# Patient Record
Sex: Female | Born: 1955 | Race: White | Hispanic: No | Smoking: Former smoker
Health system: Southern US, Community
[De-identification: ages and names within clinical notes are randomized; demographics above are authoritative.]

## PROBLEM LIST (undated history)

## (undated) DIAGNOSIS — Z720 Tobacco use: Secondary | ICD-10-CM

## (undated) DIAGNOSIS — K5792 Diverticulitis of intestine, part unspecified, without perforation or abscess without bleeding: Secondary | ICD-10-CM

## (undated) DIAGNOSIS — Z973 Presence of spectacles and contact lenses: Secondary | ICD-10-CM

## (undated) DIAGNOSIS — K219 Gastro-esophageal reflux disease without esophagitis: Secondary | ICD-10-CM

## (undated) DIAGNOSIS — T7840XA Allergy, unspecified, initial encounter: Secondary | ICD-10-CM

## (undated) DIAGNOSIS — K8070 Calculus of gallbladder and bile duct without cholecystitis without obstruction: Secondary | ICD-10-CM

## (undated) DIAGNOSIS — I8390 Asymptomatic varicose veins of unspecified lower extremity: Secondary | ICD-10-CM

## (undated) HISTORY — PX: TUBAL LIGATION: SHX77

## (undated) HISTORY — DX: Diverticulitis of intestine, part unspecified, without perforation or abscess without bleeding: K57.92

## (undated) HISTORY — PX: OTHER SURGICAL HISTORY: SHX169

## (undated) HISTORY — PX: VARICOSE VEIN SURGERY: SHX832

## (undated) HISTORY — DX: Gastro-esophageal reflux disease without esophagitis: K21.9

## (undated) HISTORY — DX: Allergy, unspecified, initial encounter: T78.40XA

---

## 2015-02-10 ENCOUNTER — Ambulatory Visit (INDEPENDENT_AMBULATORY_CARE_PROVIDER_SITE_OTHER): Payer: 59 | Admitting: Internal Medicine

## 2015-02-10 ENCOUNTER — Ambulatory Visit (INDEPENDENT_AMBULATORY_CARE_PROVIDER_SITE_OTHER): Payer: 59

## 2015-02-10 VITALS — BP 120/76 | HR 98 | Temp 98.6°F | Resp 18

## 2015-02-10 DIAGNOSIS — Z23 Encounter for immunization: Secondary | ICD-10-CM | POA: Diagnosis not present

## 2015-02-10 DIAGNOSIS — S0083XA Contusion of other part of head, initial encounter: Secondary | ICD-10-CM | POA: Diagnosis not present

## 2015-02-10 DIAGNOSIS — S0180XA Unspecified open wound of other part of head, initial encounter: Secondary | ICD-10-CM

## 2015-02-10 NOTE — Progress Notes (Signed)
Procedure Consent obtained. 2 cc 1% lido with epi local anesthesia. Cleaned with soap and water. Sterile draping placed. Wound explored. Galea not interrupted. #4 5-0 vicryl simple interrupted layered closure. 5-0 ethilon subcuticular suture placed. Care instructions placed.

## 2015-02-10 NOTE — Progress Notes (Signed)
   Subjective:    Patient ID: Jasmine Bradford, female    DOB: July 05, 1956, 59 y.o.   MRN: [id number]  HPI [place] this am at home hit forehead on bed foot board. Has wound at and above right eyebrow. No LOC, headache, or neck pain. No neuro sxs.Has healthy med hx on no medications. Would like wound repaired. Needs Tdap. Not on any meds.  Review of Systems     Objective:   Physical Exam  Constitutional: She is oriented to person, place, and time. She appears well-developed and well-nourished. She appears distressed.  HENT:  Head: Head is with contusion and with laceration. Head is without Battle's sign and without abrasion.    Right Ear: External ear normal.  Left Ear: External ear normal.  Nose: Nose normal.  Eyes: EOM are normal. Pupils are equal, round, and reactive to light.  Neck: Normal range of motion. Neck supple.  Cardiovascular: Normal rate.   Pulmonary/Chest: Effort normal.  Musculoskeletal: Normal range of motion.  Neurological: She is alert and oriented to person, place, and time. She exhibits normal muscle tone. Coordination normal.  Psychiatric: She has a normal mood and affect. Her behavior is normal. Judgment and thought content normal.  Vitals reviewed.  Wound repair  [facility] reading (PRIMARY) by  Dr.Guest no fx seen see marker, calcified pineal       Assessment & Plan:  Wound repair by Tishcira PAc Tdap Head care Wound and head care Quit smoking

## 2015-02-10 NOTE — Patient Instructions (Addendum)
Smoking Cessation Quitting smoking is important to your health and has many advantages. However, it is not always easy to quit since nicotine is a very addictive drug. Oftentimes, people try 3 times or more before being able to quit. This document explains the best ways for you to prepare to quit smoking. Quitting takes hard work and a lot of effort, but you can do it. ADVANTAGES OF QUITTING SMOKING  You will live longer, feel better, and live better.  Your body will feel the impact of quitting smoking almost immediately.  Within 20 minutes, blood pressure decreases. Your pulse returns to its normal level.  After 8 hours, carbon monoxide levels in the blood return to normal. Your oxygen level increases.  After 24 hours, the chance of having a heart attack starts to decrease. Your breath, hair, and body stop smelling like smoke.  After 48 hours, damaged nerve endings begin to recover. Your sense of taste and smell improve.  After 72 hours, the body is virtually free of nicotine. Your bronchial tubes relax and breathing becomes easier.  After 2 to 12 weeks, lungs can hold more air. Exercise becomes easier and circulation improves.  The risk of having a heart attack, stroke, cancer, or lung disease is greatly reduced.  After 1 year, the risk of coronary heart disease is cut in half.  After 5 years, the risk of stroke falls to the same as a nonsmoker.  After 10 years, the risk of lung cancer is cut in half and the risk of other cancers decreases significantly.  After 15 years, the risk of coronary heart disease drops, usually to the level of a nonsmoker.  If you are pregnant, quitting smoking will improve your chances of having a healthy baby.  The people you live with, especially any children, will be healthier.  You will have extra money to spend on things other than cigarettes. QUESTIONS TO THINK ABOUT BEFORE ATTEMPTING TO QUIT You may want to talk about your answers with your  health care provider.  Why do you want to quit?  If you tried to quit in the past, what helped and what did not?  What will be the most difficult situations for you after you quit? How will you plan to handle them?  Who can help you through the tough times? Your family? Friends? A health care provider?  What pleasures do you get from smoking? What ways can you still get pleasure if you quit? Here are some questions to ask your health care provider:  How can you help me to be successful at quitting?  What medicine do you think would be best for me and how should I take it?  What should I do if I need more help?  What is smoking withdrawal like? How can I get information on withdrawal? GET READY  Set a quit date.  Change your environment by getting rid of all cigarettes, ashtrays, matches, and lighters in your home, car, or work. Do not let people smoke in your home.  Review your past attempts to quit. Think about what worked and what did not. GET SUPPORT AND ENCOURAGEMENT You have a better chance of being successful if you have help. You can get support in many ways.  Tell your family, friends, and coworkers that you are going to quit and need their support. Ask them not to smoke around you.  Get individual, group, or telephone counseling and support. Programs are available at local hospitals and health centers. Call   your local health department for information about programs in your area.  Spiritual beliefs and practices may help some smokers quit.  Download a "quit meter" on your computer to keep track of quit statistics, such as how long you have gone without smoking, cigarettes not smoked, and money saved.  Get a self-help book about quitting smoking and staying off tobacco. [place] yourself from urges to smoke. Talk to someone, go for a walk, or occupy your time with a task.  Change your normal routine. Take a different route to work.  Drink tea instead of coffee. Eat breakfast in a different place.  Reduce your stress. Take a hot bath, exercise, or read a book.  Plan something enjoyable to do every day. Reward yourself for not smoking.  Explore interactive web-based programs that specialize in helping you quit. GET MEDICINE AND USE IT CORRECTLY Medicines can help you stop smoking and decrease the urge to smoke. Combining medicine with the above behavioral methods and support can greatly increase your chances of successfully quitting smoking.  Nicotine replacement therapy helps deliver nicotine to your body without the negative effects and risks of smoking. Nicotine replacement therapy includes nicotine gum, lozenges, inhalers, nasal sprays, and skin patches. Some may be available over-the-counter and others require a prescription.  Antidepressant medicine helps people abstain from smoking, but how this works is unknown. This medicine is available by prescription.  Nicotinic receptor partial agonist medicine simulates the effect of nicotine in your brain. This medicine is available by prescription. Ask your health care provider for advice about which medicines to use and how to use them based on your health history. Your health care provider will tell you what side effects to look out for if you choose to be on a medicine or therapy. Carefully read the information on the package. Do not use any other product containing nicotine while using a nicotine replacement product.  RELAPSE OR DIFFICULT SITUATIONS Most relapses occur within the first 3 months after quitting. Do not be discouraged if you start smoking again. Remember, most people try several times before finally quitting. You may have symptoms of withdrawal because your body is used to nicotine. You may crave cigarettes, be irritable, feel very hungry, cough often, get headaches, or have difficulty concentrating. The withdrawal symptoms are only temporary. They are strongest  when you first quit, but they will go away within 10-14 days. To reduce the chances of relapse, try to:  Avoid drinking alcohol. Drinking lowers your chances of successfully quitting.  Reduce the amount of caffeine you consume. Once you quit smoking, the amount of caffeine in your body increases and can give you symptoms, such as a rapid heartbeat, sweating, and anxiety.  Avoid smokers because they can make you want to smoke.  Do not let weight gain distract you. Many smokers will gain weight when they quit, usually less than 10 pounds. Eat a healthy diet and stay active. You can always lose the weight gained after you quit.  Find ways to improve your mood other than smoking. FOR MORE INFORMATION  www.smokefree.gov Facial Laceration  A facial laceration is a cut on the face. These injuries can be painful and cause bleeding. Lacerations usually heal quickly, but they need special care to reduce scarring. DIAGNOSIS  Your health care provider will take a medical history, ask for details about how the injury occurred, and examine the wound to determine how deep the cut is. TREATMENT  Some facial lacerations  may not require closure. Others may not be able to be closed because of an increased risk of infection. The risk of infection and the chance for successful closure will depend on various factors, including the amount of time since the injury occurred. The wound may be cleaned to help prevent infection. If closure is appropriate, pain medicines may be given if needed. Your health care provider will use stitches (sutures), wound glue (adhesive), or skin adhesive strips to repair the laceration. These tools bring the skin edges together to allow for faster healing and a better cosmetic outcome. If needed, you may also be given a tetanus shot. HOME CARE INSTRUCTIONS  Only take over-the-counter or prescription medicines as directed by your health care provider.  Follow your health care provider's  instructions for wound care. These instructions will vary depending on the technique used for closing the wound. For Sutures:  Keep the wound clean and dry.   If you were given a bandage (dressing), you should change it at least once a day. Also change the dressing if it becomes wet or dirty, or as directed by your health care provider.   Wash the wound with soap and water 2 times a day. Rinse the wound off with water to remove all soap. Pat the wound dry with a clean towel.   After cleaning, apply a thin layer of the antibiotic ointment recommended by your health care provider. This will help prevent infection and keep the dressing from sticking.   You may shower as usual after the first 24 hours. Do not soak the wound in water until the sutures are removed.   Get your sutures removed as directed by your health care provider. With facial lacerations, sutures should usually be taken out after 4-5 days to avoid stitch marks.   Wait a few days after your sutures are removed before applying any makeup. For Skin Adhesive Strips:  Keep the wound clean and dry.   Do not get the skin adhesive strips wet. You may bathe carefully, using caution to keep the wound dry.   If the wound gets wet, pat it dry with a clean towel.   Skin adhesive strips will fall off on their own. You may trim the strips as the wound heals. Do not remove skin adhesive strips that are still stuck to the wound. They will fall off in time.  For Wound Adhesive:  You may briefly wet your wound in the shower or bath. Do not soak or scrub the wound. Do not swim. Avoid periods of heavy sweating until the skin adhesive has fallen off on its own. After showering or bathing, gently pat the wound dry with a clean towel.   Do not apply liquid medicine, cream medicine, ointment medicine, or makeup to your wound while the skin adhesive is in place. This may loosen the film before your wound is healed.   If a dressing is  placed over the wound, be careful not to apply tape directly over the skin adhesive. This may cause the adhesive to be pulled off before the wound is healed.   Avoid prolonged exposure to sunlight or tanning lamps while the skin adhesive is in place.  The skin adhesive will usually remain in place for 5-10 days, then naturally fall off the skin. Do not pick at the adhesive film.  After Healing: Once the wound has healed, cover the wound with sunscreen during the day for 1 full year. This can help minimize scarring. Exposure to  ultraviolet light in the first year will darken the scar. It can take 1-2 years for the scar to lose its redness and to heal completely.  SEEK IMMEDIATE MEDICAL CARE IF:  You have redness, pain, or swelling around the wound.   You see ayellowish-white fluid (pus) coming from the wound.   You have chills or a fever.  MAKE SURE YOU:  Understand these instructions.  Will watch your condition.  Will get help right away if you are not doing well or get worse. Document Released: 09/15/2004 Document Revised: 05/29/2013 Document Reviewed: 03/21/2013 [facility] Patient Information 2015 [place], [place]. This information is not intended to replace advice given to you by your health care provider. Make sure you discuss any questions you have with your health care provider.  Document Released: 08/02/2001 Document Revised: 12/23/2013 Document Reviewed: 11/17/2011 [facility] Patient Information 2015 [place], [place]. This information is not intended to replace advice given to you by your health care provider. Make sure you discuss any questions you have with your health care provider. Head Injury You have received a head injury. It does not appear serious at this time. Headaches and vomiting are common following head injury. It should be easy to awaken from sleeping. Sometimes it is necessary for you to stay in the emergency department for a while for observation. Sometimes  admission to the hospital may be needed. After injuries such as yours, most problems occur within the first 24 hours, but side effects may occur up to 7-10 days after the injury. It is important for you to carefully monitor your condition and contact your health care provider or seek immediate medical care if there is a change in your condition. WHAT ARE THE TYPES OF HEAD INJURIES? Head injuries can be as minor as a bump. Some head injuries can be more severe. More severe head injuries include:  A jarring injury to the brain (concussion).  A bruise of the brain (contusion). This mean there is bleeding in the brain that can cause swelling.  A cracked skull (skull fracture).  Bleeding in the brain that collects, clots, and forms a bump (hematoma). WHAT CAUSES A HEAD INJURY? A serious head injury is most likely to happen to someone who is in a car wreck and is not wearing a seat belt. Other causes of major head injuries include bicycle or motorcycle accidents, sports injuries, and falls. HOW ARE HEAD INJURIES DIAGNOSED? A complete history of the event leading to the injury and your current symptoms will be helpful in diagnosing head injuries. Many times, pictures of the brain, such as CT or MRI are needed to see the extent of the injury. Often, an overnight hospital stay is necessary for observation.  WHEN SHOULD I SEEK IMMEDIATE MEDICAL CARE?  You should get help right away if:  You have confusion or drowsiness.  You feel sick to your stomach (nauseous) or have continued, forceful vomiting.  You have dizziness or unsteadiness that is getting worse.  You have severe, continued headaches not relieved by medicine. Only take over-the-counter or prescription medicines for pain, fever, or discomfort as directed by your health care provider.  You do not have normal function of the arms or legs or are unable to walk.  You notice changes in the black spots in the center of the colored part of your  eye (pupil).  You have a clear or bloody fluid coming from your nose or ears.  You have a loss of vision. During the next 24 hours after  the injury, you must stay with someone who can watch you for the warning signs. This person should contact local emergency services (911 in the [place] if you have seizures, you become unconscious, or you are unable to wake up. HOW CAN I PREVENT A HEAD INJURY IN THE FUTURE? The most important factor for preventing major head injuries is avoiding motor vehicle accidents. To minimize the potential for damage to your head, it is crucial to wear seat belts while riding in motor vehicles. Wearing helmets while bike riding and playing collision sports (like football) is also helpful. Also, avoiding dangerous activities around the house will further help reduce your risk of head injury.  WHEN CAN I RETURN TO NORMAL ACTIVITIES AND ATHLETICS? You should be reevaluated by your health care provider before returning to these activities. If you have any of the following symptoms, you should not return to activities or contact sports until 1 week after the symptoms have stopped:  Persistent headache.  Dizziness or vertigo.  Poor attention and concentration.  Confusion.  Memory problems.  Nausea or vomiting.  Fatigue or tire easily.  Irritability.  Intolerant of bright lights or loud noises.  Anxiety or depression.  Disturbed sleep. MAKE SURE YOU:   Understand these instructions.  Will watch your condition.  Will get help right away if you are not doing well or get worse. Document Released: 08/08/2005 Document Revised: 08/13/2013 Document Reviewed: 04/15/2013 [facility] Patient Information 2015 [place], [place]. This information is not intended to replace advice given to you by your health care provider. Make sure you discuss any questions you have with your health care provider.

## 2015-02-15 ENCOUNTER — Ambulatory Visit (INDEPENDENT_AMBULATORY_CARE_PROVIDER_SITE_OTHER): Payer: 59 | Admitting: Physician Assistant

## 2015-02-15 VITALS — BP 112/74 | HR 81 | Temp 98.6°F | Resp 18

## 2015-02-15 DIAGNOSIS — S0180XD Unspecified open wound of other part of head, subsequent encounter: Secondary | ICD-10-CM

## 2015-02-15 DIAGNOSIS — Z4802 Encounter for removal of sutures: Secondary | ICD-10-CM

## 2015-02-15 NOTE — Progress Notes (Signed)
Chief Complaint  Patient presents with  . Suture / Staple Removal    left eyebrow    History of Present Illness: Patient presents for Suture Removal.  Wound of the LEFT brow was repaired 5 days ago with #4 vicryl subcutaneous sutures and #1 Ethilon subcuticular suture. She's doing well, without problems or concerns. No pain, swelling, drainage. No fever.   No Known Allergies  Prior to Admission medications   Not on File    There are no active problems to display for this patient.    Physical Exam  Constitutional: She is oriented to person, place, and time. She appears well-developed and well-nourished. She is active and cooperative. No distress.  BP 112/74 mmHg  Pulse 81  Temp(Src) 98.6 F (37 C) (Oral)  Resp 18  SpO2 99%   Eyes: Conjunctivae are normal.  Pulmonary/Chest: Effort normal.  Neurological: She is alert and oriented to person, place, and time.  Skin: Skin is warm and dry. Ecchymosis (of the LEFT periorbital area consistent with her injury, appears to be fading.) and laceration (well healed laceration. No evidence of cellulitis. Ethilon suture removed without difficulty.) noted. No rash noted.  Psychiatric: She has a normal mood and affect. Her speech is normal and behavior is normal.      ASSESSMENT & PLAN:  1. Wound, open, face, subsequent encounter 2. Encounter for removal of sutures Local wound care. Anticipatory guidance provided. RTC PRN.   Fara Chute, PA-C Physician Assistant-Certified [facility]

## 2016-03-03 ENCOUNTER — Encounter (HOSPITAL_COMMUNITY): Payer: Self-pay | Admitting: *Deleted

## 2016-03-03 ENCOUNTER — Emergency Department (HOSPITAL_COMMUNITY): Payer: 59

## 2016-03-03 ENCOUNTER — Observation Stay (HOSPITAL_COMMUNITY)
Admission: EM | Admit: 2016-03-03 | Discharge: 2016-03-05 | Disposition: A | Discharge status: Home / Self Care | Payer: 59 | Attending: Internal Medicine | Admitting: Internal Medicine

## 2016-03-03 DIAGNOSIS — R7989 Other specified abnormal findings of blood chemistry: Secondary | ICD-10-CM | POA: Diagnosis not present

## 2016-03-03 DIAGNOSIS — R0789 Other chest pain: Secondary | ICD-10-CM | POA: Diagnosis not present

## 2016-03-03 DIAGNOSIS — R109 Unspecified abdominal pain: Secondary | ICD-10-CM | POA: Diagnosis present

## 2016-03-03 DIAGNOSIS — E876 Hypokalemia: Secondary | ICD-10-CM | POA: Diagnosis not present

## 2016-03-03 DIAGNOSIS — K8070 Calculus of gallbladder and bile duct without cholecystitis without obstruction: Principal | ICD-10-CM | POA: Insufficient documentation

## 2016-03-03 DIAGNOSIS — R1013 Epigastric pain: Secondary | ICD-10-CM

## 2016-03-03 DIAGNOSIS — Z720 Tobacco use: Secondary | ICD-10-CM | POA: Diagnosis present

## 2016-03-03 DIAGNOSIS — Z87891 Personal history of nicotine dependence: Secondary | ICD-10-CM | POA: Diagnosis present

## 2016-03-03 DIAGNOSIS — F17200 Nicotine dependence, unspecified, uncomplicated: Secondary | ICD-10-CM | POA: Diagnosis not present

## 2016-03-03 DIAGNOSIS — R945 Abnormal results of liver function studies: Secondary | ICD-10-CM | POA: Diagnosis present

## 2016-03-03 HISTORY — DX: Tobacco use: Z72.0

## 2016-03-03 LAB — HEPATIC FUNCTION PANEL
ALBUMIN: 3.8 g/dL (ref 3.5–5.0)
ALK PHOS: 337 U/L — AB (ref 38–126)
ALT: 524 U/L — AB (ref 14–54)
AST: 262 U/L — AB (ref 15–41)
BILIRUBIN TOTAL: 1.8 mg/dL — AB (ref 0.3–1.2)
Bilirubin, Direct: 1 mg/dL — ABNORMAL HIGH (ref 0.1–0.5)
Indirect Bilirubin: 0.8 mg/dL (ref 0.3–0.9)
Total Protein: 6.5 g/dL (ref 6.5–8.1)

## 2016-03-03 LAB — I-STAT TROPONIN, ED
Troponin i, poc: 0 ng/mL (ref 0.00–0.08)
Troponin i, poc: 0 ng/mL (ref 0.00–0.08)

## 2016-03-03 LAB — CBC
HCT: 42.6 % (ref 36.0–46.0)
Hemoglobin: 14.3 g/dL (ref 12.0–15.0)
MCH: 28.9 pg (ref 26.0–34.0)
MCHC: 33.6 g/dL (ref 30.0–36.0)
MCV: 86.2 fL (ref 78.0–100.0)
PLATELETS: 205 10*3/uL (ref 150–400)
RBC: 4.94 MIL/uL (ref 3.87–5.11)
RDW: 13 % (ref 11.5–15.5)
WBC: 8.5 10*3/uL (ref 4.0–10.5)

## 2016-03-03 LAB — BASIC METABOLIC PANEL
Anion gap: 10 (ref 5–15)
BUN: 8 mg/dL (ref 6–20)
CHLORIDE: 104 mmol/L (ref 101–111)
CO2: 26 mmol/L (ref 22–32)
CREATININE: 0.81 mg/dL (ref 0.44–1.00)
Calcium: 9.1 mg/dL (ref 8.9–10.3)
GFR calc Af Amer: >60 mL/min (ref >60)
GFR calc non Af Amer: >60 mL/min (ref >60)
GLUCOSE: 98 mg/dL (ref 65–99)
Potassium: 3.4 mmol/L — ABNORMAL LOW (ref 3.5–5.1)
Sodium: 140 mmol/L (ref 135–145)

## 2016-03-03 LAB — LIPASE, BLOOD: Lipase: 24 U/L (ref 11–51)

## 2016-03-03 MED ORDER — GI COCKTAIL ~~LOC~~
30.0000 mL | Freq: Once | ORAL | Status: AC
  Administered 2016-03-03: 30 mL via ORAL
  Filled 2016-03-03: qty 30

## 2016-03-03 NOTE — ED Notes (Signed)
Pt c/o chest pain since Tuesday. States that today she started having pressure today and that was what scared her.

## 2016-03-03 NOTE — ED Provider Notes (Signed)
CSN: [id number]     Arrival date & time 03/03/16  1755 History   First MD Initiated Contact with Patient 03/03/16 2032     Chief Complaint  Patient presents with  . Chest Pain     (Consider location/radiation/quality/duration/timing/severity/associated sxs/prior Treatment) HPI Comments: 60 year old female with no significant past medical history presents for upper abdominal/chest pain. The patient reports that on Tuesday night she had severe pain in her upper abdomen that seemed to travel up into her chest. She says she has a history of indigestion and tried taking Alka-Seltzer without relief at that time. She said that she vomited multiple times during the acute pain. She actually had to call off of work. She said she would then was feeling better and did go to work today but the pain started to come back. When the pain felt like it was coming back she asked her husband to drive her to the emergency department. She reports that today he did feel like an Alka-Seltzer helped to calm her symptoms. She denies fevers or chills. No diarrhea. She says the pain was similar to her previous indigestion episodes but significantly worse. She has had limited by mouth intake since the onset of symptoms. She says eating makes the symptoms worse.  Patient is a 60 y.o. female presenting with chest pain.  Chest Pain Associated symptoms: abdominal pain (upper), back pain (pain does feel like it radiates into her back), nausea and vomiting (currently resolved)   Associated symptoms: no cough, no dizziness, no fatigue, no fever, no headache, no palpitations, no shortness of breath and no weakness     History reviewed. No pertinent past medical history. History reviewed. No pertinent past surgical history. No family history on file. Social History  Substance Use Topics  . Smoking status: Current Every Day Smoker  . Smokeless tobacco: None  . Alcohol Use: No   OB History    No data available     Review of  Systems  Constitutional: Negative for fever, chills and fatigue.  HENT: Negative for congestion, postnasal drip, rhinorrhea and sinus pressure.   Eyes: Negative for visual disturbance.  Respiratory: Negative for cough, chest tightness and shortness of breath.   Cardiovascular: Positive for chest pain. Negative for palpitations and leg swelling.  Gastrointestinal: Positive for nausea, vomiting (currently resolved) and abdominal pain (upper). Negative for diarrhea and constipation.  Genitourinary: Negative for dysuria, urgency and hematuria.  Musculoskeletal: Positive for back pain (pain does feel like it radiates into her back). Negative for myalgias.  Skin: Negative for rash.  Neurological: Negative for dizziness, weakness, light-headedness and headaches.  Hematological: Does not bruise/bleed easily.      Allergies  Review of patient's allergies indicates no known allergies.  Home Medications   Prior to Admission medications   Medication Sig Start Date End Date Taking? Authorizing Provider  Ibuprofen-Diphenhydramine HCl (ADVIL PM) 200-25 MG CAPS Take 1 tablet by mouth daily as needed. For leg pain   Yes Historical Provider, MD  Polyethyl Glycol-Propyl Glycol (SYSTANE OP) Place 1 drop into both eyes 2 (two) times daily.   Yes Historical Provider, MD   BP 119/63 mmHg  Pulse 51  Temp(Src) 98.7 F (37.1 C) (Oral)  Resp 15  Ht 5\' 2"  (1.575 m)  Wt 125 lb (56.7 kg)  BMI 22.86 kg/m2  SpO2 93% Physical Exam  Constitutional: She is oriented to person, place, and time. She appears well-developed and well-nourished. No distress.  HENT:  Head: Normocephalic and atraumatic.  Right Ear:  External ear normal.  Left Ear: External ear normal.  Nose: Nose normal.  Mouth/Throat: Oropharynx is clear and moist. No oropharyngeal exudate.  Eyes: EOM are normal. Pupils are equal, round, and reactive to light.  Neck: Normal range of motion. Neck supple.  Cardiovascular: Normal rate, regular rhythm,  normal heart sounds and intact distal pulses.   No murmur heard. Pulmonary/Chest: Effort normal. No respiratory distress. She has no wheezes. She has no rales.  Abdominal: Soft. She exhibits no distension. There is tenderness (epigastric).  Musculoskeletal: Normal range of motion. She exhibits no edema or tenderness.  Neurological: She is alert and oriented to person, place, and time.  Skin: Skin is warm and dry. No rash noted. She is not diaphoretic.  Vitals reviewed.   ED Course  Procedures (including critical care time) Labs Review Labs Reviewed  BASIC METABOLIC PANEL - Abnormal; Notable for the following:    Potassium 3.4 (*)    All other components within normal limits  HEPATIC FUNCTION PANEL - Abnormal; Notable for the following:    AST 262 (*)    ALT 524 (*)    Alkaline Phosphatase 337 (*)    Total Bilirubin 1.8 (*)    Bilirubin, Direct 1.0 (*)    All other components within normal limits  CBC  LIPASE, BLOOD  I-STAT TROPOININ, ED  Randolm Idol, ED    Imaging Review Dg Chest 2 View  03/03/2016  CLINICAL DATA:  Central chest pain with emesis. EXAM: CHEST  2 VIEW COMPARISON:  None. FINDINGS: Tapering of the peripheral pulmonary vasculature favors emphysema. Cardiac and mediastinal margins appear normal. No pleural effusion. No pneumomediastinum. IMPRESSION: 1. Possible emphysema. Otherwise, no significant abnormalities are observed. Electronically Signed   By: Van Clines M.D.   On: 03/03/2016 18:30   US Abdomen Limited Ruq  03/03/2016  CLINICAL DATA:  60 year old female with elevated LFTs and epigastric pain. EXAM: US ABDOMEN LIMITED - RIGHT UPPER QUADRANT COMPARISON:  None. FINDINGS: Gallbladder: The gallbladder is filled with stones. There is no gallbladder wall thickening or pericholecystic fluid. Negative sonographic Murphy's sign. Common bile duct: Diameter: 5 mm Liver: There is a 1.8 cm focal area of calcification in the right lobe of the liver which may be  sequela of chronic inflammation or granuloma. The liver is otherwise unremarkable. IMPRESSION: Cholelithiasis without sonographic evidence acute cholecystitis. A hepatobiliary scintigraphy may provide better evaluation of the gallbladder if an acute cholecystitis is clinically suspected. Electronically Signed   By: Anner Crete M.D.   On: 03/03/2016 23:08   I have personally reviewed and evaluated these images and lab results as part of my medical decision-making.   EKG Interpretation   Date/Time:  Thursday March 03 2016 18:01:12 EDT Ventricular Rate:  88 PR Interval:  130 QRS Duration: 82 QT Interval:  366 QTC Calculation: 442 R Axis:   80 Text Interpretation:  Normal sinus rhythm with sinus arrhythmia  Nonspecific ST abnormality Abnormal ECG No previous ECGs available  Confirmed by NGUYEN, EMILY ([id number]) on 03/03/2016 8:27:02 PM      MDM  Patient was seen and evaluated in stable condition. Patient with epigastric tenderness. Patient otherwise well-appearing. History sounds more consistent with abdominal pathology. Lipase and LFTs obtained. Lipase normal and LFTs elevated. Abdominal ultrasound showed cholelithiasis without sign of cholecystitis. In light of patient's significantly elevated LFTs and her concerning history is recommended that the patient be admitted for more definitive study with an MRCP. Serial troponins were normal. There were nonspecific ST irregularities on her  EKG. Case was discussed with Dr.Niu who agreed with admission. Patient was admitted under his care in stable condition. Final diagnoses:  Epigastric pain  Elevated LFTs    1. Epigastric pain 2. Elevated LFTs 3. Cholelithiasis    Harvel Quale, MD 03/04/16 [phone]

## 2016-03-03 NOTE — H&P (Signed)
History and Physical    Jasmine Bradford [id number] DOB: July 07, 1956 DOA: 03/03/2016  Referring MD/NP/PA:   PCP: No PCP Per Patient   Patient coming from:  The patient is coming from home.  At baseline, pt is independent for most of ADL.       Chief Complaint: Abdominal pain and chest pressure  HPI: Jasmine Bradford is a 60 y.o. female with medical history significant of tobacco abuse, who presents with abdominal pain and chest pressure.  Patient reports that she has been having abdominal pain in the past 2 days. The pain is located in the upper abdomen, intermittent. It happens every few hours. When it happens, it is sharp and can reach 10 out of 10 in severity, radiating to the lower abdomen. It is associative with nausea no vomiting. She vomited 3 times on Tuesday without blood in the vomitus. She did not vomited today. No fever, chills, diarrhea.  Patient states that she has one episode of chest pressure today. It is located in the substernal area, mild, 4 out of 10 in severity, lasted for a few hours, resolved spontaneously. Currently, no chest pain or chest pressure. Patient does not have shortness breath. Patient denies symptoms of UTI, cough, unilateral weakness.  ED Course: pt was found to have negative troponin, lipase 24, WBC8.5, temperature normal, abnormal liver function with ALT 337, AST 262, ALT 524 and total bilirubin 1.8, potassium 3.4, creatinine normal. Chest x-ray showed possible emphysema. Abdominal ultrasound showed cholelithiasis without sonographic evidence acute cholecystitis.    Review of Systems:   General: no fevers, chills, no changes in body weight, has poor appetite, has fatigue HEENT: no blurry vision, hearing changes or sore throat Pulm: no dyspnea, coughing, wheezing CV: had chest pressure, no palpitations Abd: has nausea, vomiting, abdominal pain, no diarrhea, constipation GU: no dysuria, burning on urination, increased urinary frequency, hematuria  Ext:  no leg edema Neuro: no unilateral weakness, numbness, or tingling, no vision change or hearing loss Skin: no rash MSK: No muscle spasm, no deformity, no limitation of range of movement in spin Heme: No easy bruising.  Travel history: No recent long distant travel.  Allergy: No Known Allergies  Past Medical History  Diagnosis Date  . Tobacco abuse     Past Surgical History  Procedure Laterality Date  . Cesarean section      Social History:  reports that she has been smoking.  She does not have any smokeless tobacco history on file. She reports that she does not drink alcohol or use illicit drugs.  Family History:  Family History  Problem Relation Age of Onset  . Memory loss Mother      Prior to Admission medications   Medication Sig Start Date End Date Taking? Authorizing Provider  Ibuprofen-Diphenhydramine HCl (ADVIL PM) 200-25 MG CAPS Take 1 tablet by mouth daily as needed. For leg pain   Yes Historical Provider, MD  Polyethyl Glycol-Propyl Glycol (SYSTANE OP) Place 1 drop into both eyes 2 (two) times daily.   Yes Historical Provider, MD    Physical Exam: Filed Vitals:   03/03/16 2230 03/03/16 2320 03/03/16 2330 03/04/16 0000  BP: 113/83 119/63 124/65 122/67  Pulse: 63 51 74 73  Temp:      TempSrc:      Resp: 11 15 22 13   Height:      Weight:      SpO2: 97% 93% 97% 98%   General: Not in acute distress HEENT:       Eyes:  PERRL, EOMI, no scleral icterus.       ENT: No discharge from the ears and nose, no pharynx injection, no tonsillar enlargement.        Neck: No JVD, no bruit, no mass felt. Heme: No neck lymph node enlargement. Cardiac: S1/S2, RRR, No murmurs, No gallops or rubs. Pulm:  No rales, wheezing, rhonchi or rubs. Abd: Soft, nondistended, mild tenderness over epigastric area, no rebound pain, no organomegaly, BS present. GU: No hematuria Ext: No pitting leg edema bilaterally. 2+DP/PT pulse bilaterally. Musculoskeletal: No joint deformities, No joint  redness or warmth, no limitation of ROM in spin. Skin: No rashes.  Neuro: Alert, oriented X3, cranial nerves II-XII grossly intact, moves all extremities normally. Psych: Patient is not psychotic, no suicidal or hemocidal ideation.  Labs on Admission: I have personally reviewed following labs and imaging studies  CBC:  Recent Labs Lab 03/03/16 1804  WBC 8.5  HGB 14.3  HCT 42.6  MCV 86.2  PLT 99991111   Basic Metabolic Panel:  Recent Labs Lab 03/03/16 1804  NA 140  K 3.4*  CL 104  CO2 26  GLUCOSE 98  BUN 8  CREATININE 0.81  CALCIUM 9.1   GFR: Estimated Creatinine Clearance: 57.7 mL/min (by C-G formula based on Cr of 0.81). Liver Function Tests:  Recent Labs Lab 03/03/16 1804  AST 262*  ALT 524*  ALKPHOS 337*  BILITOT 1.8*  PROT 6.5  ALBUMIN 3.8    Recent Labs Lab 03/03/16 1804  LIPASE 24   No results for input(s): AMMONIA in the last 168 hours. Coagulation Profile: No results for input(s): INR, PROTIME in the last 168 hours. Cardiac Enzymes: No results for input(s): CKTOTAL, CKMB, CKMBINDEX, TROPONINI in the last 168 hours. BNP (last 3 results) No results for input(s): PROBNP in the last 8760 hours. HbA1C: No results for input(s): HGBA1C in the last 72 hours. CBG: No results for input(s): GLUCAP in the last 168 hours. Lipid Profile: No results for input(s): CHOL, HDL, LDLCALC, TRIG, CHOLHDL, LDLDIRECT in the last 72 hours. Thyroid Function Tests: No results for input(s): TSH, T4TOTAL, FREET4, T3FREE, THYROIDAB in the last 72 hours. Anemia Panel: No results for input(s): VITAMINB12, FOLATE, FERRITIN, TIBC, IRON, RETICCTPCT in the last 72 hours. Urine analysis: No results found for: COLORURINE, APPEARANCEUR, LABSPEC, PHURINE, GLUCOSEU, HGBUR, BILIRUBINUR, KETONESUR, PROTEINUR, UROBILINOGEN, NITRITE, LEUKOCYTESUR Sepsis Labs: @LABRCNTIP (procalcitonin:4,lacticidven:4) )No results found for this or any previous visit (from the past 240 hour(s)).    Radiological Exams on Admission: Dg Chest 2 View  03/03/2016  CLINICAL DATA:  Central chest pain with emesis. EXAM: CHEST  2 VIEW COMPARISON:  None. FINDINGS: Tapering of the peripheral pulmonary vasculature favors emphysema. Cardiac and mediastinal margins appear normal. No pleural effusion. No pneumomediastinum. IMPRESSION: 1. Possible emphysema. Otherwise, no significant abnormalities are observed. Electronically Signed   By: Van Clines M.D.   On: 03/03/2016 18:30   US Abdomen Limited Ruq  03/03/2016  CLINICAL DATA:  60 year old female with elevated LFTs and epigastric pain. EXAM: US ABDOMEN LIMITED - RIGHT UPPER QUADRANT COMPARISON:  None. FINDINGS: Gallbladder: The gallbladder is filled with stones. There is no gallbladder wall thickening or pericholecystic fluid. Negative sonographic Murphy's sign. Common bile duct: Diameter: 5 mm Liver: There is a 1.8 cm focal area of calcification in the right lobe of the liver which may be sequela of chronic inflammation or granuloma. The liver is otherwise unremarkable. IMPRESSION: Cholelithiasis without sonographic evidence acute cholecystitis. A hepatobiliary scintigraphy may provide better evaluation of the gallbladder  if an acute cholecystitis is clinically suspected. Electronically Signed   By: Anner Crete M.D.   On: 03/03/2016 23:08     EKG: Independently reviewed. Sinus rhythm, QTC 442, T-wave flattening, mild ST depression in V4-V5.  Assessment/Plan Principal Problem:   Abdominal pain Active Problems:   Tobacco abuse   Hypokalemia   Chest pressure   LFTs abnormal   Abdominal pain: lipase is normal. Abdominal ultrasound showed cholelithiasis without sonographic evidence acute cholecystitis. Her LFT is abnormal.  -will place on tele bed for obs due to chest pressure and abnormal EKG -will get MRCP for further evaluation of biliary system -Check hepatitis panel -Avoid liver toxic medications, such as Tylenol -prn Zofran for  nausea and oxycodone for pain -IV fluid: Normal saline 75 mL per hour  Abnormal LFT: ALT 337, AST 262, ALT 524 and total bilirubin 1.8 -see above  Chest pressure: has resolved now. Initial troponin is negative. Patient has abnormal EKG with T-wave flattening and mild ST depression in V4-V5. Given her old age and history of tobacco abuse, will do chest pain rule out. - cycle CE q6 x3 and repeat her EKG in the am  - prn Nitroglycerin, Morphine, and aspirin - Risk factor stratification: will check FLP and A1C  - 2d echo  Tobacco abuse: -Did counseling about importance of quitting smoking -Nicotine patch  Hypokalemia: K= 3.4 on admission. - Repleted   DVT ppx:  SQ Lovenox Code Status: Full code Family Communication: Yes, patient's husband at bed side Disposition Plan:  Anticipate discharge back to previous home environment Consults called:  none Admission status: Obs / tele  Date of Service 03/04/2016    Ivor Costa [facility] Pager [phone]  If 7PM-7AM, please contact night-coverage [email or web address] Password [email or web address] 03/04/2016, 12:30 AM

## 2016-03-03 NOTE — ED Notes (Signed)
EDP at bedside  

## 2016-03-03 NOTE — ED Notes (Signed)
Patient transported to Ultrasound 

## 2016-03-03 NOTE — ED Notes (Signed)
Pt states she has taken alka seltzer and Prilosec to relieve the pain but no relief.

## 2016-03-04 ENCOUNTER — Observation Stay (HOSPITAL_COMMUNITY): Payer: 59

## 2016-03-04 ENCOUNTER — Observation Stay (HOSPITAL_BASED_OUTPATIENT_CLINIC_OR_DEPARTMENT_OTHER): Payer: 59

## 2016-03-04 ENCOUNTER — Encounter (HOSPITAL_COMMUNITY): Payer: Self-pay | Admitting: Internal Medicine

## 2016-03-04 ENCOUNTER — Other Ambulatory Visit (HOSPITAL_COMMUNITY): Payer: 59

## 2016-03-04 DIAGNOSIS — R1010 Upper abdominal pain, unspecified: Secondary | ICD-10-CM | POA: Diagnosis not present

## 2016-03-04 DIAGNOSIS — Z720 Tobacco use: Secondary | ICD-10-CM

## 2016-03-04 DIAGNOSIS — F17200 Nicotine dependence, unspecified, uncomplicated: Secondary | ICD-10-CM | POA: Diagnosis not present

## 2016-03-04 DIAGNOSIS — R7989 Other specified abnormal findings of blood chemistry: Secondary | ICD-10-CM | POA: Insufficient documentation

## 2016-03-04 DIAGNOSIS — R0789 Other chest pain: Secondary | ICD-10-CM

## 2016-03-04 DIAGNOSIS — E876 Hypokalemia: Secondary | ICD-10-CM

## 2016-03-04 DIAGNOSIS — R945 Abnormal results of liver function studies: Secondary | ICD-10-CM

## 2016-03-04 DIAGNOSIS — R079 Chest pain, unspecified: Secondary | ICD-10-CM | POA: Diagnosis not present

## 2016-03-04 DIAGNOSIS — K8070 Calculus of gallbladder and bile duct without cholecystitis without obstruction: Secondary | ICD-10-CM | POA: Diagnosis not present

## 2016-03-04 LAB — LIPID PANEL
CHOLESTEROL: 198 mg/dL (ref 0–200)
HDL: 53 mg/dL (ref >40)
LDL Cholesterol: 131 mg/dL — ABNORMAL HIGH (ref 0–99)
TRIGLYCERIDES: 68 mg/dL (ref <150)
Total CHOL/HDL Ratio: 3.7 RATIO
VLDL: 14 mg/dL (ref 0–40)

## 2016-03-04 LAB — CBC
HEMATOCRIT: 40.5 % (ref 36.0–46.0)
HEMOGLOBIN: 13.6 g/dL (ref 12.0–15.0)
MCH: 29.2 pg (ref 26.0–34.0)
MCHC: 33.6 g/dL (ref 30.0–36.0)
MCV: 87.1 fL (ref 78.0–100.0)
Platelets: 183 10*3/uL (ref 150–400)
RBC: 4.65 MIL/uL (ref 3.87–5.11)
RDW: 13.3 % (ref 11.5–15.5)
WBC: 5.3 10*3/uL (ref 4.0–10.5)

## 2016-03-04 LAB — COMPREHENSIVE METABOLIC PANEL
ALT: 446 U/L — ABNORMAL HIGH (ref 14–54)
ANION GAP: 10 (ref 5–15)
AST: 182 U/L — ABNORMAL HIGH (ref 15–41)
Albumin: 3.3 g/dL — ABNORMAL LOW (ref 3.5–5.0)
Alkaline Phosphatase: 336 U/L — ABNORMAL HIGH (ref 38–126)
BILIRUBIN TOTAL: 1.2 mg/dL (ref 0.3–1.2)
BUN: 8 mg/dL (ref 6–20)
CO2: 26 mmol/L (ref 22–32)
Calcium: 8.8 mg/dL — ABNORMAL LOW (ref 8.9–10.3)
Chloride: 103 mmol/L (ref 101–111)
Creatinine, Ser: 0.75 mg/dL (ref 0.44–1.00)
GFR calc Af Amer: >60 mL/min (ref >60)
Glucose, Bld: 86 mg/dL (ref 65–99)
POTASSIUM: 3.9 mmol/L (ref 3.5–5.1)
Sodium: 139 mmol/L (ref 135–145)
TOTAL PROTEIN: 6.2 g/dL — AB (ref 6.5–8.1)

## 2016-03-04 LAB — ECHOCARDIOGRAM COMPLETE
Height: 62 in
WEIGHTICAEL: 1940.8 [oz_av]

## 2016-03-04 LAB — RAPID URINE DRUG SCREEN, HOSP PERFORMED
AMPHETAMINES: NOT DETECTED
BARBITURATES: POSITIVE — AB
BENZODIAZEPINES: NOT DETECTED
COCAINE: NOT DETECTED
Opiates: NOT DETECTED
TETRAHYDROCANNABINOL: NOT DETECTED

## 2016-03-04 LAB — PROTIME-INR
INR: 0.94 (ref 0.00–1.49)
PROTHROMBIN TIME: 12.8 s (ref 11.6–15.2)

## 2016-03-04 LAB — TROPONIN I
Troponin I: <0.03 ng/mL (ref <0.03)
Troponin I: <0.03 ng/mL (ref <0.03)

## 2016-03-04 LAB — GLUCOSE, CAPILLARY: GLUCOSE-CAPILLARY: 88 mg/dL (ref 65–99)

## 2016-03-04 MED ORDER — POLYVINYL ALCOHOL 1.4 % OP SOLN
1.0000 [drp] | Freq: Two times a day (BID) | OPHTHALMIC | Status: DC
  Administered 2016-03-04 (×2): 1 [drp] via OPHTHALMIC
  Filled 2016-03-04: qty 15

## 2016-03-04 MED ORDER — OXYCODONE HCL 5 MG PO TABS: 10.0000 mg | ORAL_TABLET | Freq: Four times a day (QID) | ORAL | Status: DC | PRN

## 2016-03-04 MED ORDER — POTASSIUM CHLORIDE 20 MEQ/15ML (10%) PO SOLN
20.0000 meq | Freq: Once | ORAL | Status: AC
  Administered 2016-03-04: 20 meq via ORAL
  Filled 2016-03-04: qty 15

## 2016-03-04 MED ORDER — MORPHINE SULFATE (PF) 2 MG/ML IV SOLN: 2.0000 mg | INTRAVENOUS | Status: DC | PRN

## 2016-03-04 MED ORDER — NICOTINE 21 MG/24HR TD PT24
21.0000 mg | MEDICATED_PATCH | Freq: Every day | TRANSDERMAL | Status: DC
  Administered 2016-03-04 – 2016-03-05 (×2): 21 mg via TRANSDERMAL
  Filled 2016-03-04 (×2): qty 1

## 2016-03-04 MED ORDER — SODIUM CHLORIDE 0.45 % IV SOLN: INTRAVENOUS | Status: DC

## 2016-03-04 MED ORDER — SODIUM CHLORIDE 0.9% FLUSH
3.0000 mL | Freq: Two times a day (BID) | INTRAVENOUS | Status: DC
  Administered 2016-03-04 (×2): 3 mL via INTRAVENOUS

## 2016-03-04 MED ORDER — NITROGLYCERIN 0.4 MG SL SUBL: 0.4000 mg | SUBLINGUAL_TABLET | SUBLINGUAL | Status: DC | PRN

## 2016-03-04 MED ORDER — IBUPROFEN 200 MG PO TABS
200.0000 mg | ORAL_TABLET | Freq: Every evening | ORAL | Status: DC | PRN
  Administered 2016-03-04: 200 mg via ORAL
  Filled 2016-03-04: qty 1

## 2016-03-04 MED ORDER — ENOXAPARIN SODIUM 40 MG/0.4ML ~~LOC~~ SOLN: 40.0000 mg | SUBCUTANEOUS | Status: DC

## 2016-03-04 MED ORDER — SODIUM CHLORIDE 0.9 % IV SOLN
INTRAVENOUS | Status: DC
  Administered 2016-03-04 – 2016-03-05 (×2): via INTRAVENOUS

## 2016-03-04 MED ORDER — ASPIRIN 325 MG PO TABS
325.0000 mg | ORAL_TABLET | Freq: Every day | ORAL | Status: DC
  Administered 2016-03-04 – 2016-03-05 (×3): 325 mg via ORAL
  Filled 2016-03-04 (×3): qty 1

## 2016-03-04 MED ORDER — DIPHENHYDRAMINE HCL 25 MG PO CAPS
25.0000 mg | ORAL_CAPSULE | Freq: Every evening | ORAL | Status: DC | PRN
  Administered 2016-03-04: 25 mg via ORAL
  Filled 2016-03-04: qty 1

## 2016-03-04 MED ORDER — IBUPROFEN-DIPHENHYDRAMINE HCL 200-25 MG PO CAPS: 1.0000 | ORAL_CAPSULE | Freq: Every day | ORAL | Status: DC | PRN

## 2016-03-04 MED ORDER — GADOBENATE DIMEGLUMINE 529 MG/ML IV SOLN
10.0000 mL | Freq: Once | INTRAVENOUS | Status: AC
  Administered 2016-03-04: 10 mL via INTRAVENOUS

## 2016-03-04 MED ORDER — ONDANSETRON HCL 4 MG/2ML IJ SOLN: 4.0000 mg | Freq: Three times a day (TID) | INTRAMUSCULAR | Status: DC | PRN

## 2016-03-04 NOTE — ED Notes (Signed)
Admitting at bedside 

## 2016-03-04 NOTE — Care Management Note (Signed)
Case Management Note Marvetta Gibbons RN, BSN Unit 2W-Case Manager [phone]  Patient Details  Name: Jasmine Bradford MRN: [id number] Date of Birth: 03-Mar-1956  Subjective/Objective: Pt admitted with abd pain                   Action/Plan: PTA pt lived at home with spouse- anticipate return home- referral received for PCP needs- pt has [facility] insurance- spoke with pt at bedside- confirmed insurance- pt given Psychologist, educational # for physician referral assistance and explained how to use referral line- pt can also call insurance provider for list of in network physicians.   Expected Discharge Date:     03/04/16             Expected Discharge Plan:  Home/Self Care  In-House Referral:     Discharge planning Services  CM Consult  Post Acute Care Choice:    Choice offered to:     DME Arranged:    DME Agency:     HH Arranged:    HH Agency:     Status of Service:  Completed, signed off  If discussed at [employer] of Stay Meetings, dates discussed:    Additional Comments:  Dawayne Patricia, RN 03/04/2016, 2:06 PM

## 2016-03-04 NOTE — Progress Notes (Signed)
  Echocardiogram 2D Echocardiogram has been performed.  Donata Clay 03/04/2016, 12:06 PM

## 2016-03-04 NOTE — Consult Note (Signed)
Subjective:   HPI  The patient is a 60 year old female who was admitted to the hospital with upper abdominal pain. The pain was intermittent but because of its intensity she came in to be evaluated. She was admitted. She did have some associated vomiting but no hematemesis. She has been having some intermittent upper abdominal pain for a while. She was found to have elevated liver enzymes. Bilirubin was 1.2, outline phosphatase 336, ALT 446, AST 182. She had an abdominal ultrasound which showed gallstones. She has not had cholecystectomy in the past. An MRCP was done which showed choledocholithiasis.  The patient is pain-free at this time and feels fine. She just ate a full dinner and feels fine.  Review of Systems No current chest pain or shortness of breath  Past Medical History  Diagnosis Date  . Tobacco abuse    Past Surgical History  Procedure Laterality Date  . Cesarean section     Social History   Social History  . Marital Status: Married    Spouse Name: N/A  . Number of Children: N/A  . Years of Education: N/A   Occupational History  . Not on file.   Social History Main Topics  . Smoking status: Current Every Day Smoker  . Smokeless tobacco: Not on file  . Alcohol Use: No  . Drug Use: No  . Sexual Activity: Not on file   Other Topics Concern  . Not on file   Social History Narrative   family history includes Memory loss in her mother.  Current facility-administered medications:  .  0.9 %  sodium chloride infusion, , Intravenous, Continuous, Ivor Costa, MD, Last Rate: 75 mL/hr at 03/04/16 0124 .  aspirin tablet 325 mg, 325 mg, Oral, Daily, Ivor Costa, MD, 325 mg at 03/04/16 1035 .  ibuprofen (ADVIL,MOTRIN) tablet 200 mg, 200 mg, Oral, QHS PRN **AND** diphenhydrAMINE (BENADRYL) capsule 25 mg, 25 mg, Oral, QHS PRN, Ivor Costa, MD .  enoxaparin (LOVENOX) injection 40 mg, 40 mg, Subcutaneous, Q24H, Ivor Costa, MD, 40 mg at 03/04/16 1200 .  morphine 2 MG/ML injection 2  mg, 2 mg, Intravenous, Q4H PRN, Ivor Costa, MD .  nicotine (NICODERM CQ - dosed in mg/24 hours) patch 21 mg, 21 mg, Transdermal, Daily, Ivor Costa, MD, 21 mg at 03/04/16 1457 .  nitroGLYCERIN (NITROSTAT) SL tablet 0.4 mg, 0.4 mg, Sublingual, Q5 min PRN, Ivor Costa, MD .  ondansetron [facility]) injection 4 mg, 4 mg, Intravenous, Q8H PRN, Ivor Costa, MD .  oxyCODONE (Oxy IR/ROXICODONE) immediate release tablet 10 mg, 10 mg, Oral, Q6H PRN, Ivor Costa, MD .  polyvinyl alcohol (LIQUIFILM TEARS) 1.4 % ophthalmic solution 1 drop, 1 drop, Both Eyes, BID, Ivor Costa, MD, 1 drop at 03/04/16 1036 .  sodium chloride flush (NS) 0.9 % injection 3 mL, 3 mL, Intravenous, Q12H, Ivor Costa, MD, 3 mL at 03/04/16 1035 No Known Allergies   Objective:     BP 110/65 mmHg  Pulse 72  Temp(Src) 98.1 F (36.7 C) (Oral)  Resp 18  Ht 5\' 2"  (1.575 m)  Wt 55.021 kg (121 lb 4.8 oz)  BMI 22.18 kg/m2  SpO2 97%  Alert and oriented  No acute distress  Nonicteric  Heart regular rhythm no murmurs  Lungs clear  Abdomen: Bowel sounds normal, soft, nontender, no obvious hepatosplenomegaly  Laboratory No components found for: D1    Assessment:     Cholelithiasis  Choledocholithiasis      Plan:     I would recommend that  the patient have an ERCP with sphincterotomy and stone extraction from the biliary tree, then referral to surgery would be appropriate for cholecystectomy. The patient feels fine at this time. We talked about this. I think it would be reasonable to discharge her tomorrow if she is doing well, and she can follow-up with one of our biliary endoscopists in the office next week and set up elective ERCP sphincterotomy and stone extraction, then surgical referral can be made. If she is stable and discharged tomorrow we can set her up to see either Dr. Watt Climes, Paulita Fujita or Amedeo Plenty for elective ERCP.

## 2016-03-04 NOTE — Progress Notes (Signed)
PROGRESS NOTE    Jasmine Bradford  [id number] DOB: [birth year] DOA: 03/03/2016 PCP: No PCP Per Patient   Brief Narrative:  HPI on 03/03/2016 by Dr. Ivor Costa Jasmine Bradford is a 60 y.o. female with medical history significant of tobacco abuse, who presents with abdominal pain and chest pressure. Patient reports that she has been having abdominal pain in the past 2 days. The pain is located in the upper abdomen, intermittent. It happens every few hours. When it happens, it is sharp and can reach 10 out of 10 in severity, radiating to the lower abdomen. It is associative with nausea no vomiting. She vomited 3 times on Tuesday without blood in the vomitus. She did not vomited today. No fever, chills, diarrhea. Patient states that she has one episode of chest pressure today. It is located in the substernal area, mild, 4 out of 10 in severity, lasted for a few hours, resolved spontaneously. Currently, no chest pain or chest pressure. Patient does not have shortness breath. Patient denies symptoms of UTI, cough, unilateral weakness.  Assessment & Plan   Abdominal pain secondary to cholelithiasis/CBD -LFT elevated, lipase WNL -Abdominal ultrasound showed cholelithiasis without sonographic evidence acute cholecystitis.  -MRCP showed moderate intra-and extrahepatic biliary duct dilatation with multiple filling defects in the common hepatic duct and common bile duct leading to possible "condyle physis. -Will consult gastroenterology for possible ERCP -Pending gastroenterology consult, will likely consult general surgery -Abdominal pain is improving, continue antiemetics and pain control as needed  Abnormal LFT -Secondary to the above -Upon admission, ALT 337, AST 262, ALT 524 and total bilirubin 1.8 -Improving, continue to monitor BMP  Chest pressure -has resolved now, suspect due to abdominal pain. Troponin cycled and unremarkable -Patient has abnormal EKG with T-wave flattening and mild ST  depression in V4-V5. Given her old age and history of tobacco abuse, will do chest pain rule out. -Echocardiogram pending -Continue aspirin -Lipid panel showed TC 198, TG 68, HDL 53, LDL 131 (will not start statin at this time given her elevated LFTs)  Tobacco abuse -Smoking cessation discussed, continue nicotine patch  Hypokalemia -Resolved, continue to monitor BMP   DVT Prophylaxis  lovenox  Code Status: Full  Family Communication: None at bedside  Disposition Plan: Admit for observation  Consultants Gastroenterology  Procedures  Abdominal ultrasound MRCP Echocardiogram  Antibiotics   Anti-infectives    None      Subjective:   Jasmine Bradford seen and examined today. Patient denies any further abdominal pain, nausea or vomiting. Does not complain of any chest pain or shortness of breath at this time. Patient does not understand why all these tests are being done. She currently denies any dizziness, headache.   Objective:   Filed Vitals:   03/03/16 2330 03/04/16 0000 03/04/16 0056 03/04/16 0632  BP: 124/65 122/67 117/65 96/60  Pulse: 74 73 61 60  Temp:   98.7 F (37.1 C) 98.4 F (36.9 C)  TempSrc:   Oral Oral  Resp: 22 13 18 18   Height:   5\' 2"  (1.575 m)   Weight:   55.021 kg (121 lb 4.8 oz)   SpO2: 97% 98% 95% 97%   No intake or output data in the 24 hours ending 03/04/16 1216 Filed Weights   03/03/16 1801 03/04/16 0056  Weight: 56.7 kg (125 lb) 55.021 kg (121 lb 4.8 oz)    Exam  General: Well developed, well nourished, NAD, appears stated age  HEENT: NCAT, PERRLA, EOMI, Anicteic Sclera, mucous membranes moist.  Neck: Supple, no JVD, no masses  Cardiovascular: S1 S2 auscultated, no rubs, murmurs or gallops. Regular rate and rhythm.  Respiratory: Clear to auscultation bilaterally with equal chest rise  Abdomen: Soft, nontender, nondistended, + bowel sounds  Extremities: warm dry without cyanosis clubbing or edema  Neuro: AAOx3, cranial nerves  grossly intact. Strength 5/5 in patient's upper and lower extremities bilaterally  Skin: Without rashes exudates or nodules  Psych: Normal affect and demeanor with intact judgement and insight   Data Reviewed: I have personally reviewed following labs and imaging studies  CBC:  Recent Labs Lab 03/03/16 1804 03/04/16 0657  WBC 8.5 5.3  HGB 14.3 13.6  HCT 42.6 40.5  MCV 86.2 87.1  PLT 205 [id number]   Basic Metabolic Panel:  Recent Labs Lab 03/03/16 1804 03/04/16 0657  NA 140 139  K 3.4* 3.9  CL 104 103  CO2 26 26  GLUCOSE 98 86  BUN 8 8  CREATININE 0.81 0.75  CALCIUM 9.1 8.8*   GFR: Estimated Creatinine Clearance: 58.4 mL/min (by C-G formula based on Cr of 0.75). Liver Function Tests:  Recent Labs Lab 03/03/16 1804 03/04/16 0657  AST 262* 182*  ALT 524* 446*  ALKPHOS 337* 336*  BILITOT 1.8* 1.2  PROT 6.5 6.2*  ALBUMIN 3.8 3.3*    Recent Labs Lab 03/03/16 1804  LIPASE 24   No results for input(s): AMMONIA in the last 168 hours. Coagulation Profile:  Recent Labs Lab 03/04/16 0037  INR 0.94   Cardiac Enzymes:  Recent Labs Lab 03/04/16 0037 03/04/16 0657  TROPONINI <0.03 <0.03   BNP (last 3 results) No results for input(s): PROBNP in the last 8760 hours. HbA1C: No results for input(s): HGBA1C in the last 72 hours. CBG:  Recent Labs Lab 03/04/16 0630  GLUCAP 58   Lipid Profile:  Recent Labs  03/04/16 0657  CHOL 198  HDL 53  LDLCALC 131*  TRIG 68  CHOLHDL 3.7   Thyroid Function Tests: No results for input(s): TSH, T4TOTAL, FREET4, T3FREE, THYROIDAB in the last 72 hours. Anemia Panel: No results for input(s): VITAMINB12, FOLATE, FERRITIN, TIBC, IRON, RETICCTPCT in the last 72 hours. Urine analysis: No results found for: COLORURINE, APPEARANCEUR, LABSPEC, PHURINE, GLUCOSEU, HGBUR, BILIRUBINUR, KETONESUR, PROTEINUR, UROBILINOGEN, NITRITE, LEUKOCYTESUR Sepsis Labs: @LABRCNTIP (procalcitonin:4,lacticidven:4)  )No results found for  this or any previous visit (from the past 240 hour(s)).    Radiology Studies: Dg Chest 2 View  03/03/2016  CLINICAL DATA:  Central chest pain with emesis. EXAM: CHEST  2 VIEW COMPARISON:  None. FINDINGS: Tapering of the peripheral pulmonary vasculature favors emphysema. Cardiac and mediastinal margins appear normal. No pleural effusion. No pneumomediastinum. IMPRESSION: 1. Possible emphysema. Otherwise, no significant abnormalities are observed. Electronically Signed   By: Van Clines M.D.   On: 03/03/2016 18:30   Mr 3d Recon At Scanner  03/04/2016  CLINICAL DATA:  Epigastric and abdominal pain for 2 days. Abnormal liver function tests with elevated transaminases and elevated total bilirubin. EXAM: MRI ABDOMEN WITHOUT AND WITH CONTRAST (INCLUDING MRCP) TECHNIQUE: Multiplanar multisequence MR imaging of the abdomen was performed both before and after the administration of intravenous contrast. Heavily T2-weighted images of the biliary and pancreatic ducts were obtained, and three-dimensional MRCP images were rendered by post processing. CONTRAST:  71mL MULTIHANCE GADOBENATE DIMEGLUMINE 529 MG/ML IV SOLN COMPARISON:  None. FINDINGS: Lower chest:  Lung bases are clear. Hepatobiliary: Moderate intrahepatic biliary duct dilatation and moderate extrahepatic biliary duct dilatation. The common hepatic duct measures 14 mm and the common  bile duct measures 8 mm. There are several discrete filling defects within the common hepatic duct and common bile duct consistent with ductal stones. One stone is in the distal duct just proximal ampulla measuring 4 mm (image 32, series 6). On the heavily T2 weighted imaging/MRCP sequence sequences there is an elongated filling defect in the common hepatic duct measuring 15 mm by 6 mm which has a tubular morphology. Similar defects in the common bile duct again several filling defects in the distal common bile duct (image 43 through 38 of series 10). There is loss signal  intensity in the RIGHT hepatic lobe (image 51, series 1500, image 13, series 8) which suggests calcification. This calcification may be within a bile duct branch as the duct appears dilated around this signal abnormality (image 14, series 8). Small enhancing lesion in the posterior RIGHT hepatic lobe (image 76, series 1501 less than 10 mm) is favored benign appear. Pancreas: Normal pancreatic parenchymal intensity. No ductal dilatation or inflammation. Spleen: Normal spleen. Adrenals/urinary tract: Adrenal glands and kidneys are normal. Stomach/Bowel: Stomach and limited of the small bowel is unremarkable Vascular/Lymphatic: Abdominal aortic normal caliber. No retroperitoneal periportal lymphadenopathy. Musculoskeletal: No aggressive osseous lesion IMPRESSION: 1. Moderate Intrahepatic and extrahepatic biliary duct dilatation. 2. Multiple filling defects within the common hepatic duct and common bile duct leading up to the ampullary region consistent choledocholithiasis. Some of these filling defects are elongated suggesting tubular casts of the bile ducts. Consider ERCP for relief of obstruction. 3. Irregular signal intensity expanding the an intrahepatic duct of the anterior RIGHT hepatic lobe. This could be a source of the bile duct casts. Region not well evaluated by MRI secondary to calcification, consider CT with and without contrast for further evaluation. 4. Post cholecystectomy. 5. No pancreatic duct dilatation or inflammation. These results will be called to the ordering clinician or representative by the Radiologist Assistant, and communication documented in the PACS or zVision Dashboard. Electronically Signed   By: Suzy Bouchard M.D.   On: 03/04/2016 10:50   Mr Abd W/wo Cm/mrcp  03/04/2016  CLINICAL DATA:  Epigastric and abdominal pain for 2 days. Abnormal liver function tests with elevated transaminases and elevated total bilirubin. EXAM: MRI ABDOMEN WITHOUT AND WITH CONTRAST (INCLUDING MRCP)  TECHNIQUE: Multiplanar multisequence MR imaging of the abdomen was performed both before and after the administration of intravenous contrast. Heavily T2-weighted images of the biliary and pancreatic ducts were obtained, and three-dimensional MRCP images were rendered by post processing. CONTRAST:  81mL MULTIHANCE GADOBENATE DIMEGLUMINE 529 MG/ML IV SOLN COMPARISON:  None. FINDINGS: Lower chest:  Lung bases are clear. Hepatobiliary: Moderate intrahepatic biliary duct dilatation and moderate extrahepatic biliary duct dilatation. The common hepatic duct measures 14 mm and the common bile duct measures 8 mm. There are several discrete filling defects within the common hepatic duct and common bile duct consistent with ductal stones. One stone is in the distal duct just proximal ampulla measuring 4 mm (image 32, series 6). On the heavily T2 weighted imaging/MRCP sequence sequences there is an elongated filling defect in the common hepatic duct measuring 15 mm by 6 mm which has a tubular morphology. Similar defects in the common bile duct again several filling defects in the distal common bile duct (image 43 through 38 of series 10). There is loss signal intensity in the RIGHT hepatic lobe (image 51, series 1500, image 13, series 8) which suggests calcification. This calcification may be within a bile duct branch as the duct appears dilated around this  signal abnormality (image 14, series 8). Small enhancing lesion in the posterior RIGHT hepatic lobe (image 76, series 1501 less than 10 mm) is favored benign appear. Pancreas: Normal pancreatic parenchymal intensity. No ductal dilatation or inflammation. Spleen: Normal spleen. Adrenals/urinary tract: Adrenal glands and kidneys are normal. Stomach/Bowel: Stomach and limited of the small bowel is unremarkable Vascular/Lymphatic: Abdominal aortic normal caliber. No retroperitoneal periportal lymphadenopathy. Musculoskeletal: No aggressive osseous lesion IMPRESSION: 1. Moderate  Intrahepatic and extrahepatic biliary duct dilatation. 2. Multiple filling defects within the common hepatic duct and common bile duct leading up to the ampullary region consistent choledocholithiasis. Some of these filling defects are elongated suggesting tubular casts of the bile ducts. Consider ERCP for relief of obstruction. 3. Irregular signal intensity expanding the an intrahepatic duct of the anterior RIGHT hepatic lobe. This could be a source of the bile duct casts. Region not well evaluated by MRI secondary to calcification, consider CT with and without contrast for further evaluation. 4. Post cholecystectomy. 5. No pancreatic duct dilatation or inflammation. These results will be called to the ordering clinician or representative by the Radiologist Assistant, and communication documented in the PACS or zVision Dashboard. Electronically Signed   By: Suzy Bouchard M.D.   On: 03/04/2016 10:50   US Abdomen Limited Ruq  03/03/2016  CLINICAL DATA:  60 year old female with elevated LFTs and epigastric pain. EXAM: US ABDOMEN LIMITED - RIGHT UPPER QUADRANT COMPARISON:  None. FINDINGS: Gallbladder: The gallbladder is filled with stones. There is no gallbladder wall thickening or pericholecystic fluid. Negative sonographic Murphy's sign. Common bile duct: Diameter: 5 mm Liver: There is a 1.8 cm focal area of calcification in the right lobe of the liver which may be sequela of chronic inflammation or granuloma. The liver is otherwise unremarkable. IMPRESSION: Cholelithiasis without sonographic evidence acute cholecystitis. A hepatobiliary scintigraphy may provide better evaluation of the gallbladder if an acute cholecystitis is clinically suspected. Electronically Signed   By: Anner Crete M.D.   On: 03/03/2016 23:08     Scheduled Meds: . aspirin  325 mg Oral Daily  . enoxaparin (LOVENOX) injection  40 mg Subcutaneous Q24H  . nicotine  21 mg Transdermal Daily  . polyvinyl alcohol  1 drop Both Eyes  BID  . sodium chloride flush  3 mL Intravenous Q12H   Continuous Infusions: . sodium chloride 75 mL/hr at 03/04/16 0124       Time Spent in minutes   30 minutes  Libni Fusaro D.O. on 03/04/2016 at 12:16 PM  Between 7am to 7pm - Pager - [phone]  After 7pm go to [email or web address] - password [email or web address]  And look for the night coverage person covering for me after hours  [facility] Office  [phone]

## 2016-03-05 DIAGNOSIS — K8051 Calculus of bile duct without cholangitis or cholecystitis with obstruction: Secondary | ICD-10-CM

## 2016-03-05 DIAGNOSIS — Z720 Tobacco use: Secondary | ICD-10-CM | POA: Diagnosis not present

## 2016-03-05 DIAGNOSIS — E876 Hypokalemia: Secondary | ICD-10-CM | POA: Diagnosis not present

## 2016-03-05 DIAGNOSIS — R0789 Other chest pain: Secondary | ICD-10-CM | POA: Diagnosis not present

## 2016-03-05 DIAGNOSIS — R1010 Upper abdominal pain, unspecified: Secondary | ICD-10-CM | POA: Diagnosis not present

## 2016-03-05 LAB — GLUCOSE, CAPILLARY: Glucose-Capillary: 111 mg/dL — ABNORMAL HIGH (ref 65–99)

## 2016-03-05 LAB — COMPREHENSIVE METABOLIC PANEL
ALT: 431 U/L — ABNORMAL HIGH (ref 14–54)
ANION GAP: 8 (ref 5–15)
AST: 253 U/L — AB (ref 15–41)
Albumin: 2.8 g/dL — ABNORMAL LOW (ref 3.5–5.0)
Alkaline Phosphatase: 343 U/L — ABNORMAL HIGH (ref 38–126)
BILIRUBIN TOTAL: 3.8 mg/dL — AB (ref 0.3–1.2)
BUN: 10 mg/dL (ref 6–20)
CHLORIDE: 111 mmol/L (ref 101–111)
CO2: 23 mmol/L (ref 22–32)
Calcium: 8.4 mg/dL — ABNORMAL LOW (ref 8.9–10.3)
Creatinine, Ser: 0.76 mg/dL (ref 0.44–1.00)
Glucose, Bld: 106 mg/dL — ABNORMAL HIGH (ref 65–99)
POTASSIUM: 3.8 mmol/L (ref 3.5–5.1)
Sodium: 142 mmol/L (ref 135–145)
TOTAL PROTEIN: 5.3 g/dL — AB (ref 6.5–8.1)

## 2016-03-05 LAB — HEPATITIS PANEL, ACUTE
HCV Ab: <0.1 s/co ratio (ref 0.0–0.9)
Hep A IgM: NEGATIVE
Hep B C IgM: NEGATIVE
Hepatitis B Surface Ag: NEGATIVE

## 2016-03-05 LAB — CBC
HEMATOCRIT: 40.2 % (ref 36.0–46.0)
HEMOGLOBIN: 13.2 g/dL (ref 12.0–15.0)
MCH: 28.8 pg (ref 26.0–34.0)
MCHC: 32.8 g/dL (ref 30.0–36.0)
MCV: 87.6 fL (ref 78.0–100.0)
Platelets: 178 10*3/uL (ref 150–400)
RBC: 4.59 MIL/uL (ref 3.87–5.11)
RDW: 13.3 % (ref 11.5–15.5)
WBC: 6.9 10*3/uL (ref 4.0–10.5)

## 2016-03-05 LAB — HEMOGLOBIN A1C
Hgb A1c MFr Bld: 5.5 % (ref 4.8–5.6)
MEAN PLASMA GLUCOSE: 111 mg/dL

## 2016-03-05 NOTE — Discharge Instructions (Signed)

## 2016-03-05 NOTE — Progress Notes (Signed)
Pt/family given discharge instructions, medication lists, follow up appointments, and when to call the doctor.  Pt/family verbalizes understanding. Sadiyah Kangas McClintock, RN   

## 2016-03-05 NOTE — Discharge Summary (Signed)
Physician Discharge Summary  Emanda Soo [id number] DOB: 1956/05/02 DOA: 03/03/2016  PCP: No PCP Per Patient  Admit date: 03/03/2016 Discharge date: 03/05/2016  Time spent: 45 minutes  Recommendations for Outpatient Follow-up:  Patient will be discharged to home.  Patient will need to establish care and follow up with primary care provider. Follow up with gastroenterology next week for ERCP, office will contact you.  Repeat CMP. Patient should continue medications as prescribed.  Patient should follow a regular diet.   Discharge Diagnoses:  Abdominal pain secondary to cholelithiasis/CBD Abnormal LFT Chest pressure Tobacco abuse Hypokalemia  Discharge Condition: stable  Diet recommendation: regular  Filed Weights   03/03/16 1801 03/04/16 0056  Weight: 56.7 kg (125 lb) 55.021 kg (121 lb 4.8 oz)    History of present illness:  on 03/03/2016 by Dr. Ivor Costa Lynsy Prodoehl is a 60 y.o. female with medical history significant of tobacco abuse, who presents with abdominal pain and chest pressure. Patient reports that she has been having abdominal pain in the past 2 days. The pain is located in the upper abdomen, intermittent. It happens every few hours. When it happens, it is sharp and can reach 10 out of 10 in severity, radiating to the lower abdomen. It is associative with nausea no vomiting. She vomited 3 times on Tuesday without blood in the vomitus. She did not vomited today. No fever, chills, diarrhea. Patient states that she has one episode of chest pressure today. It is located in the substernal area, mild, 4 out of 10 in severity, lasted for a few hours, resolved spontaneously. Currently, no chest pain or chest pressure. Patient does not have shortness breath. Patient denies symptoms of UTI, cough, unilateral weakness.  Hospital Course:  Abdominal pain secondary to cholelithiasis/CBD -LFTs elevated, lipase WNL -Abdominal ultrasound showed cholelithiasis without sonographic  evidence acute cholecystitis.  -MRCP showed moderate intra-and extrahepatic biliary duct dilatation with multiple filling defects in the common hepatic duct and common bile duct leading to possible "condyle physis. -Gastroenterology consulted and appreciated. Spoke with Dr. Penelope Coop, patient can be discharged with outpatient ERCP next week. Office will contact the patient for follow up.  General surgery referral after ERCP. -Abdominal pain is improving, continue antiemetics and pain control as needed -Diet advanced, and patient tolerated well.  Abnormal LFT -Secondary to the above -Upon admission, AST 262, ALT 524 and total bilirubin 1.8 -Currently AST 253, ALT 431, total bili 3.8 -Plan and treatment as above  Chest pressure -has resolved now, suspect due to abdominal pain. Troponin cycled and unremarkable -Patient has abnormal EKG with T-wave flattening and mild ST depression in V4-V5. Given her old age and history of tobacco abuse, will do chest pain rule out. -Echocardiogram EF 60-65% -Continue aspirin -Lipid panel showed TC 198, TG 68, HDL 53, LDL 131 (will not start statin at this time given her elevated LFTs)  Tobacco abuse -Smoking cessation discussed, continue nicotine patch  Hypokalemia -Resolved, continue to monitor BMP   Consultants Gastroenterology  Procedures  Abdominal ultrasound MRCP Echocardiogram  Discharge Exam: Filed Vitals:   03/04/16 2038 03/05/16 0511  BP: 92/52 92/51  Pulse: 74 64  Temp: 98.4 F (36.9 C) 97.9 F (36.6 C)  Resp: 16 16    Exam  General: Well developed, well nourished, NAD, appears stated age  HEENT: NCAT, mucous membranes moist.   Cardiovascular: S1 S2 auscultated, no rubs, murmurs or gallops. Regular rate and rhythm.  Respiratory: Clear to auscultation bilaterally with equal chest rise  Abdomen: Soft, nontender,  nondistended, + bowel sounds  Extremities: warm dry without cyanosis clubbing or edema  Neuro: AAOx3,  nonfocal  Psych: Appropriate mood and affect, pleasant.  Discharge Instructions      Discharge Instructions    Discharge instructions    Complete by:  As directed   Patient will be discharged to home.  Patient will need to establish care and follow up with primary care provider. Follow up with gastroenterology next week for ERCP, office will contact you.  Repeat CMP. Patient should continue medications as prescribed.  Patient should follow a regular diet.            Medication List    STOP taking these medications        ADVIL PM 200-25 MG Caps  Generic drug:  Ibuprofen-Diphenhydramine HCl      TAKE these medications        SYSTANE OP  Place 1 drop into both eyes 2 (two) times daily.       No Known Allergies Follow-up Information    Follow up with [facility].   Contact information:   Please call [id number] [phone]- for physician referral list assistance to find primary care doctor-      Follow up with [facility].   Why:  Office will call you to set up ERCP   Contact information:   [place] [id number] [phone]        The results of significant diagnostics from this hospitalization (including imaging, microbiology, ancillary and laboratory) are listed below for reference.    Significant Diagnostic Studies: Dg Chest 2 View  03/03/2016  CLINICAL DATA:  Central chest pain with emesis. EXAM: CHEST  2 VIEW COMPARISON:  None. FINDINGS: Tapering of the peripheral pulmonary vasculature favors emphysema. Cardiac and mediastinal margins appear normal. No pleural effusion. No pneumomediastinum. IMPRESSION: 1. Possible emphysema. Otherwise, no significant abnormalities are observed. Electronically Signed   By: Van Clines M.D.   On: 03/03/2016 18:30   Mr 3d Recon At Scanner  03/04/2016  CLINICAL DATA:  Epigastric and abdominal pain for 2 days. Abnormal liver function tests with elevated transaminases and elevated total  bilirubin. EXAM: MRI ABDOMEN WITHOUT AND WITH CONTRAST (INCLUDING MRCP) TECHNIQUE: Multiplanar multisequence MR imaging of the abdomen was performed both before and after the administration of intravenous contrast. Heavily T2-weighted images of the biliary and pancreatic ducts were obtained, and three-dimensional MRCP images were rendered by post processing. CONTRAST:  86mL MULTIHANCE GADOBENATE DIMEGLUMINE 529 MG/ML IV SOLN COMPARISON:  None. FINDINGS: Lower chest:  Lung bases are clear. Hepatobiliary: Moderate intrahepatic biliary duct dilatation and moderate extrahepatic biliary duct dilatation. The common hepatic duct measures 14 mm and the common bile duct measures 8 mm. There are several discrete filling defects within the common hepatic duct and common bile duct consistent with ductal stones. One stone is in the distal duct just proximal ampulla measuring 4 mm (image 32, series 6). On the heavily T2 weighted imaging/MRCP sequence sequences there is an elongated filling defect in the common hepatic duct measuring 15 mm by 6 mm which has a tubular morphology. Similar defects in the common bile duct again several filling defects in the distal common bile duct (image 43 through 38 of series 10). There is loss signal intensity in the RIGHT hepatic lobe (image 51, series 1500, image 13, series 8) which suggests calcification. This calcification may be within a bile duct branch as the duct appears dilated around this signal abnormality (image 14, series  8). Small enhancing lesion in the posterior RIGHT hepatic lobe (image 76, series 1501 less than 10 mm) is favored benign appear. Pancreas: Normal pancreatic parenchymal intensity. No ductal dilatation or inflammation. Spleen: Normal spleen. Adrenals/urinary tract: Adrenal glands and kidneys are normal. Stomach/Bowel: Stomach and limited of the small bowel is unremarkable Vascular/Lymphatic: Abdominal aortic normal caliber. No retroperitoneal periportal  lymphadenopathy. Musculoskeletal: No aggressive osseous lesion IMPRESSION: 1. Moderate Intrahepatic and extrahepatic biliary duct dilatation. 2. Multiple filling defects within the common hepatic duct and common bile duct leading up to the ampullary region consistent choledocholithiasis. Some of these filling defects are elongated suggesting tubular casts of the bile ducts. Consider ERCP for relief of obstruction. 3. Irregular signal intensity expanding the an intrahepatic duct of the anterior RIGHT hepatic lobe. This could be a source of the bile duct casts. Region not well evaluated by MRI secondary to calcification, consider CT with and without contrast for further evaluation. 4. Post cholecystectomy. 5. No pancreatic duct dilatation or inflammation. These results will be called to the ordering clinician or representative by the Radiologist Assistant, and communication documented in the PACS or zVision Dashboard. Electronically Signed   By: Suzy Bouchard M.D.   On: 03/04/2016 10:50   Mr Abd W/wo Cm/mrcp  03/04/2016  CLINICAL DATA:  Epigastric and abdominal pain for 2 days. Abnormal liver function tests with elevated transaminases and elevated total bilirubin. EXAM: MRI ABDOMEN WITHOUT AND WITH CONTRAST (INCLUDING MRCP) TECHNIQUE: Multiplanar multisequence MR imaging of the abdomen was performed both before and after the administration of intravenous contrast. Heavily T2-weighted images of the biliary and pancreatic ducts were obtained, and three-dimensional MRCP images were rendered by post processing. CONTRAST:  29mL MULTIHANCE GADOBENATE DIMEGLUMINE 529 MG/ML IV SOLN COMPARISON:  None. FINDINGS: Lower chest:  Lung bases are clear. Hepatobiliary: Moderate intrahepatic biliary duct dilatation and moderate extrahepatic biliary duct dilatation. The common hepatic duct measures 14 mm and the common bile duct measures 8 mm. There are several discrete filling defects within the common hepatic duct and common  bile duct consistent with ductal stones. One stone is in the distal duct just proximal ampulla measuring 4 mm (image 32, series 6). On the heavily T2 weighted imaging/MRCP sequence sequences there is an elongated filling defect in the common hepatic duct measuring 15 mm by 6 mm which has a tubular morphology. Similar defects in the common bile duct again several filling defects in the distal common bile duct (image 43 through 38 of series 10). There is loss signal intensity in the RIGHT hepatic lobe (image 51, series 1500, image 13, series 8) which suggests calcification. This calcification may be within a bile duct branch as the duct appears dilated around this signal abnormality (image 14, series 8). Small enhancing lesion in the posterior RIGHT hepatic lobe (image 76, series 1501 less than 10 mm) is favored benign appear. Pancreas: Normal pancreatic parenchymal intensity. No ductal dilatation or inflammation. Spleen: Normal spleen. Adrenals/urinary tract: Adrenal glands and kidneys are normal. Stomach/Bowel: Stomach and limited of the small bowel is unremarkable Vascular/Lymphatic: Abdominal aortic normal caliber. No retroperitoneal periportal lymphadenopathy. Musculoskeletal: No aggressive osseous lesion IMPRESSION: 1. Moderate Intrahepatic and extrahepatic biliary duct dilatation. 2. Multiple filling defects within the common hepatic duct and common bile duct leading up to the ampullary region consistent choledocholithiasis. Some of these filling defects are elongated suggesting tubular casts of the bile ducts. Consider ERCP for relief of obstruction. 3. Irregular signal intensity expanding the an intrahepatic duct of the anterior RIGHT hepatic lobe.  This could be a source of the bile duct casts. Region not well evaluated by MRI secondary to calcification, consider CT with and without contrast for further evaluation. 4. Post cholecystectomy. 5. No pancreatic duct dilatation or inflammation. These results will  be called to the ordering clinician or representative by the Radiologist Assistant, and communication documented in the PACS or zVision Dashboard. Electronically Signed   By: Suzy Bouchard M.D.   On: 03/04/2016 10:50   US Abdomen Limited Ruq  03/03/2016  CLINICAL DATA:  60 year old female with elevated LFTs and epigastric pain. EXAM: US ABDOMEN LIMITED - RIGHT UPPER QUADRANT COMPARISON:  None. FINDINGS: Gallbladder: The gallbladder is filled with stones. There is no gallbladder wall thickening or pericholecystic fluid. Negative sonographic Murphy's sign. Common bile duct: Diameter: 5 mm Liver: There is a 1.8 cm focal area of calcification in the right lobe of the liver which may be sequela of chronic inflammation or granuloma. The liver is otherwise unremarkable. IMPRESSION: Cholelithiasis without sonographic evidence acute cholecystitis. A hepatobiliary scintigraphy may provide better evaluation of the gallbladder if an acute cholecystitis is clinically suspected. Electronically Signed   By: Anner Crete M.D.   On: 03/03/2016 23:08    Microbiology: No results found for this or any previous visit (from the past 240 hour(s)).   Labs: Basic Metabolic Panel:  Recent Labs Lab 03/03/16 1804 03/04/16 0657 03/05/16 0543  NA 140 139 142  K 3.4* 3.9 3.8  CL 104 103 111  CO2 26 26 23   GLUCOSE 98 86 106*  BUN 8 8 10   CREATININE 0.81 0.75 0.76  CALCIUM 9.1 8.8* 8.4*   Liver Function Tests:  Recent Labs Lab 03/03/16 1804 03/04/16 0657 03/05/16 0543  AST 262* 182* 253*  ALT 524* 446* 431*  ALKPHOS 337* 336* 343*  BILITOT 1.8* 1.2 3.8*  PROT 6.5 6.2* 5.3*  ALBUMIN 3.8 3.3* 2.8*    Recent Labs Lab 03/03/16 1804  LIPASE 24   No results for input(s): AMMONIA in the last 168 hours. CBC:  Recent Labs Lab 03/03/16 1804 03/04/16 0657 03/05/16 0543  WBC 8.5 5.3 6.9  HGB 14.3 13.6 13.2  HCT 42.6 40.5 40.2  MCV 86.2 87.1 87.6  PLT 205 183 178   Cardiac Enzymes:  Recent  Labs Lab 03/04/16 0037 03/04/16 0657 03/04/16 1204  TROPONINI <0.03 <0.03 <0.03   BNP: BNP (last 3 results) No results for input(s): BNP in the last 8760 hours.  ProBNP (last 3 results) No results for input(s): PROBNP in the last 8760 hours.  CBG:  Recent Labs Lab 03/04/16 0630 03/05/16 0507  GLUCAP 88 111*       Signed:  Stclair Szymborski  [facility] 03/05/2016, 10:57 AM

## 2016-03-08 ENCOUNTER — Encounter (HOSPITAL_COMMUNITY): Payer: Self-pay | Admitting: *Deleted

## 2016-03-09 ENCOUNTER — Ambulatory Visit (HOSPITAL_COMMUNITY): Payer: 59 | Admitting: Anesthesiology

## 2016-03-09 ENCOUNTER — Encounter (HOSPITAL_COMMUNITY): Payer: Self-pay | Admitting: *Deleted

## 2016-03-09 ENCOUNTER — Ambulatory Visit (HOSPITAL_COMMUNITY)
Admission: RE | Admit: 2016-03-09 | Discharge: 2016-03-09 | Disposition: A | Discharge status: Home / Self Care | Payer: 59 | Source: Ambulatory Visit | Attending: Gastroenterology | Admitting: Gastroenterology

## 2016-03-09 ENCOUNTER — Ambulatory Visit (HOSPITAL_COMMUNITY): Payer: 59

## 2016-03-09 ENCOUNTER — Encounter (HOSPITAL_COMMUNITY)
Admission: RE | Disposition: A | Discharge status: Home / Self Care | Payer: Self-pay | Source: Ambulatory Visit | Attending: Gastroenterology

## 2016-03-09 ENCOUNTER — Other Ambulatory Visit: Payer: Self-pay | Admitting: Gastroenterology

## 2016-03-09 DIAGNOSIS — F17200 Nicotine dependence, unspecified, uncomplicated: Secondary | ICD-10-CM | POA: Insufficient documentation

## 2016-03-09 DIAGNOSIS — K8050 Calculus of bile duct without cholangitis or cholecystitis without obstruction: Secondary | ICD-10-CM | POA: Diagnosis present

## 2016-03-09 DIAGNOSIS — R109 Unspecified abdominal pain: Secondary | ICD-10-CM

## 2016-03-09 HISTORY — PX: ERCP: SHX5425

## 2016-03-09 HISTORY — DX: Asymptomatic varicose veins of unspecified lower extremity: I83.90

## 2016-03-09 SURGERY — ERCP, WITH INTERVENTION IF INDICATED
Anesthesia: General

## 2016-03-09 MED ORDER — PHENYLEPHRINE HCL 10 MG/ML IJ SOLN
INTRAMUSCULAR | Status: DC | PRN
  Administered 2016-03-09 (×2): 80 ug via INTRAVENOUS

## 2016-03-09 MED ORDER — ONDANSETRON HCL 4 MG/2ML IJ SOLN
INTRAMUSCULAR | Status: AC
  Filled 2016-03-09: qty 2

## 2016-03-09 MED ORDER — CIPROFLOXACIN IN D5W 400 MG/200ML IV SOLN
INTRAVENOUS | Status: AC
  Filled 2016-03-09: qty 200

## 2016-03-09 MED ORDER — PROPOFOL 10 MG/ML IV BOLUS
INTRAVENOUS | Status: DC | PRN
  Administered 2016-03-09: 30 mg via INTRAVENOUS
  Administered 2016-03-09: 120 mg via INTRAVENOUS

## 2016-03-09 MED ORDER — MEPERIDINE HCL 100 MG/ML IJ SOLN: 6.2500 mg | INTRAMUSCULAR | Status: DC | PRN

## 2016-03-09 MED ORDER — CEFOTETAN DISODIUM 2 G IJ SOLR
2.0000 g | INTRAMUSCULAR | Status: AC
  Administered 2016-03-09: 2 g via INTRAVENOUS
  Filled 2016-03-09: qty 2

## 2016-03-09 MED ORDER — LIDOCAINE HCL (CARDIAC) 20 MG/ML IV SOLN
INTRAVENOUS | Status: DC | PRN
  Administered 2016-03-09: 100 mg via INTRAVENOUS

## 2016-03-09 MED ORDER — FENTANYL CITRATE (PF) 100 MCG/2ML IJ SOLN
INTRAMUSCULAR | Status: DC | PRN
  Administered 2016-03-09 (×2): 50 ug via INTRAVENOUS

## 2016-03-09 MED ORDER — DEXAMETHASONE SODIUM PHOSPHATE 10 MG/ML IJ SOLN
INTRAMUSCULAR | Status: DC | PRN
  Administered 2016-03-09: 10 mg via INTRAVENOUS

## 2016-03-09 MED ORDER — PHENYLEPHRINE 40 MCG/ML (10ML) SYRINGE FOR IV PUSH (FOR BLOOD PRESSURE SUPPORT)
PREFILLED_SYRINGE | INTRAVENOUS | Status: AC
  Filled 2016-03-09: qty 10

## 2016-03-09 MED ORDER — LACTATED RINGERS IV SOLN
INTRAVENOUS | Status: DC
  Administered 2016-03-09: 1000 mL via INTRAVENOUS

## 2016-03-09 MED ORDER — SUCCINYLCHOLINE CHLORIDE 20 MG/ML IJ SOLN
INTRAMUSCULAR | Status: DC | PRN
  Administered 2016-03-09: 100 mg via INTRAVENOUS

## 2016-03-09 MED ORDER — MIDAZOLAM HCL 5 MG/5ML IJ SOLN
INTRAMUSCULAR | Status: DC | PRN
  Administered 2016-03-09: 2 mg via INTRAVENOUS

## 2016-03-09 MED ORDER — PROMETHAZINE HCL 25 MG/ML IJ SOLN: 6.2500 mg | INTRAMUSCULAR | Status: DC | PRN

## 2016-03-09 MED ORDER — INDOMETHACIN 50 MG RE SUPP
RECTAL | Status: AC
  Filled 2016-03-09: qty 2

## 2016-03-09 MED ORDER — MIDAZOLAM HCL 2 MG/2ML IJ SOLN
INTRAMUSCULAR | Status: AC
  Filled 2016-03-09: qty 2

## 2016-03-09 MED ORDER — GLUCAGON HCL RDNA (DIAGNOSTIC) 1 MG IJ SOLR
INTRAMUSCULAR | Status: AC
  Filled 2016-03-09: qty 1

## 2016-03-09 MED ORDER — SODIUM CHLORIDE 0.9 % IV SOLN: INTRAVENOUS | Status: DC

## 2016-03-09 MED ORDER — LIDOCAINE HCL (CARDIAC) 20 MG/ML IV SOLN
INTRAVENOUS | Status: AC
  Filled 2016-03-09: qty 5

## 2016-03-09 MED ORDER — FENTANYL CITRATE (PF) 100 MCG/2ML IJ SOLN
INTRAMUSCULAR | Status: AC
  Filled 2016-03-09: qty 2

## 2016-03-09 MED ORDER — DEXAMETHASONE SODIUM PHOSPHATE 10 MG/ML IJ SOLN
INTRAMUSCULAR | Status: AC
  Filled 2016-03-09: qty 1

## 2016-03-09 MED ORDER — ONDANSETRON HCL 4 MG/2ML IJ SOLN
INTRAMUSCULAR | Status: DC | PRN
  Administered 2016-03-09: 4 mg via INTRAVENOUS

## 2016-03-09 MED ORDER — PROPOFOL 10 MG/ML IV BOLUS
INTRAVENOUS | Status: AC
  Filled 2016-03-09: qty 20

## 2016-03-09 MED ORDER — SODIUM CHLORIDE 0.9 % IV SOLN
INTRAVENOUS | Status: DC | PRN
  Administered 2016-03-09: 40 mL

## 2016-03-09 NOTE — Anesthesia Procedure Notes (Signed)
Procedure Name: Intubation Date/Time: 03/09/2016 8:51 AM Performed by: Lind Covert Pre-anesthesia Checklist: Patient identified, Timeout performed, Emergency Drugs available, Suction available and Patient being monitored Patient Re-evaluated:Patient Re-evaluated prior to inductionOxygen Delivery Method: Circle system utilized Preoxygenation: Pre-oxygenation with 100% oxygen Intubation Type: IV induction Laryngoscope Size: Mac and 3 Grade View: Grade I Tube type: Oral Tube size: 7.0 mm Number of attempts: 1 Airway Equipment and Method: Stylet Placement Confirmation: ETT inserted through vocal cords under direct vision,  breath sounds checked- equal and bilateral and positive ETCO2 Secured at: 21 cm Tube secured with: Tape Dental Injury: Teeth and Oropharynx as per pre-operative assessment

## 2016-03-09 NOTE — Anesthesia Preprocedure Evaluation (Signed)
Anesthesia Evaluation  Patient identified by MRN, date of birth, ID band Patient awake    Reviewed: Allergy & Precautions, NPO status , Patient's Chart, lab work & pertinent test results  Airway Mallampati: II  TM Distance: >3 FB Neck ROM: Full    Dental no notable dental hx.    Pulmonary Current Smoker,    Pulmonary exam normal breath sounds clear to auscultation       Cardiovascular negative cardio ROS Normal cardiovascular exam Rhythm:Regular Rate:Normal     Neuro/Psych negative neurological ROS  negative psych ROS   GI/Hepatic negative GI ROS, Neg liver ROS,   Endo/Other  negative endocrine ROS  Renal/GU negative Renal ROS     Musculoskeletal negative musculoskeletal ROS (+)   Abdominal   Peds  Hematology negative hematology ROS (+)   Anesthesia Other Findings   Reproductive/Obstetrics negative OB ROS                             Anesthesia Physical Anesthesia Plan  ASA: II  Anesthesia Plan: General   Post-op Pain Management:    Induction: Intravenous  Airway Management Planned: Oral ETT  Additional Equipment:   Intra-op Plan:   Post-operative Plan: Extubation in OR  Informed Consent: I have reviewed the patients History and Physical, chart, labs and discussed the procedure including the risks, benefits and alternatives for the proposed anesthesia with the patient or authorized representative who has indicated his/her understanding and acceptance.   Dental advisory given  Plan Discussed with: CRNA  Anesthesia Plan Comments:         Anesthesia Quick Evaluation

## 2016-03-09 NOTE — Progress Notes (Signed)
Jasmine Bradford 8:43 AM  Subjective: Patient feeling better today but has had episodic pain and her history was reviewed and her case discussed with my partner Dr. Penelope Coop and her hospital computer chart was reviewed  Objective: Vital signs stable afebrile no acute distress exam please see preassessment evaluation labs and x-rays reviewed  Assessment: Gallstones and CBD stones  Plan: The risks benefits methods and success rate of ERCP was discussed with the patient as well as probable surgical options to follow and even a screening colonoscopy in 6 months if doing well and will proceed with anesthesia assistance this morning  [facility]  Pager [phone] After 5PM or if no answer call [phone]

## 2016-03-09 NOTE — Transfer of Care (Signed)
Immediate Anesthesia Transfer of Care Note  Patient: Jasmine Bradford  Procedure(s) Performed: Procedure(s): ENDOSCOPIC RETROGRADE CHOLANGIOPANCREATOGRAPHY (ERCP) (N/A)  Patient Location: PACU  Anesthesia Type:General  Level of Consciousness: sedated  Airway & Oxygen Therapy: Patient Spontanous Breathing and Patient connected to face mask oxygen  Post-op Assessment: Report given to RN and Post -op Vital signs reviewed and stable  Post vital signs: Reviewed and stable  Last Vitals:  Filed Vitals:   03/09/16 0804 03/09/16 0941  BP: 108/73 99/61  Pulse: 77 78  Temp: 36.6 C   Resp: 12 13    Last Pain:  Filed Vitals:   03/09/16 0941  PainSc: 0-No pain         Complications: No apparent anesthesia complications

## 2016-03-09 NOTE — Op Note (Signed)
[facility] Patient Name: Jasmine Bradford Procedure Date: 03/09/2016 MRN: [id number] Attending MD: Clarene Essex , MD Date of Birth: [birth year] CSN: [id number] Age: 60 Admit Type: Outpatient Procedure:                ERCP Indications:              Bile duct stone(s) Providers:                Clarene Essex, MD, Carmie End, RN, Elspeth Cho                            Tech., Technician, Derrek Gu. Alday CRNA, CRNA Referring MD:              Medicines:                General Anesthesia Complications:            No immediate complications. Estimated Blood Loss:     Estimated blood loss: none. Procedure:                Pre-Anesthesia Assessment:                           - Prior to the procedure, a History and Physical                            was performed, and patient medications and                            allergies were reviewed. The patient's tolerance of                            previous anesthesia was also reviewed. The risks                            and benefits of the procedure and the sedation                            options and risks were discussed with the patient.                            All questions were answered, and informed consent                            was obtained. Prior Anticoagulants: The patient has                            taken no previous anticoagulant or antiplatelet                            agents. ASA Grade Assessment: I - A normal, healthy                            patient. After reviewing the risks and benefits,  the patient was deemed in satisfactory condition to                            undergo the procedure.                           After obtaining informed consent, the scope was                            passed under direct vision. Throughout the                            procedure, the patient's blood pressure, pulse, and                            oxygen saturations were monitored  continuously. The                            [id number] scope was introduced through                            the mouth, and used to inject contrast into and                            used to locate the major papilla. The ERCP was                            accomplished without difficulty. The patient                            tolerated the procedure well. Scope In: Scope Out: Findings:      bulbous ampullawas found and deep selective cannulation was obtained       readily and after stones and a dilated CBD was confirmed we proceeded       with Biliary sphincterotomy was made with a Hydratome sphincterotome       using ERBE electrocautery until we had adequate biliary drainage and       could get the fully bowed sphincterotome easily in and out of the duct.       There was no post-sphincterotomy bleeding. Choledocholithiasis was found       in a nondilated duct. The main bile duct contained multiple stones       small.The common bile duct was moderately dilated. The biliary tree was       otherwise normal. To size the object(s), the biliary tree was swept with       a adjustable 12-15 mm balloon inflated initially to 12 mm then we use 15       mm which did pass readily through the patent sphincterotomy site       multiple times starting at the upper third of the main bile duct, left       intrahepatic duct(s) and right intrahepatic duct(s). Multiple small       stones were removed. No stones remained on occlusion cholangiogram and       subsequent balloon pull-through's. and there was no pancreatic duct       injections  or wire advancement throughout the procedure and there was       adequate biliary drainage again the the procedure Impression:               - The common bile duct was moderately dilated.                           - Choledocholithiasis was found. Complete removal                            was accomplished by biliary sphincterotomy.                            - A biliary sphincterotomy was performed.                           - The biliary tree was swept. Moderate Sedation:      N/A- Per Anesthesia Care Recommendation:           - Avoid aspirin and nonsteroidal anti-inflammatory                            medicines for 5 days.                           - Clear liquid diet today.                           - Continue present medications.                           - Return to GI clinic PRN.                           - Telephone GI clinic if symptomatic PRN.                           - Refer to a surgeon at appointment to be scheduled. Procedure Code(s):        --- Professional ---                           [phone], Esophagogastroduodenoscopy, flexible,                            transoral; diagnostic, including collection of                            specimen(s) by brushing or washing, when performed                            (separate procedure) Diagnosis Code(s):        --- Professional ---                           K80.50, Calculus of bile duct without cholangitis  or cholecystitis without obstruction                           K83.8, Other specified diseases of biliary tract CPT copyright 2016 [employer]. All rights reserved. The codes documented in this report are preliminary and upon coder review may  be revised to meet current compliance requirements. Clarene Essex, MD 03/09/2016 9:37:04 AM This report has been signed electronically. Number of Addenda: 0

## 2016-03-09 NOTE — Discharge Instructions (Addendum)
Gastrointestinal Endoscopy, Care After Refer to this sheet in the next few weeks. These instructions provide you with information on caring for yourself after your procedure. Your caregiver may also give you more specific instructions. Your treatment has been planned according to current medical practices, but problems sometimes occur. Call your caregiver if you have any problems or questions after your procedure. HOME CARE INSTRUCTIONS  If you were given medicine to help you relax (sedative), do not drive, operate machinery, or sign important documents for 24 hours.  Avoid alcohol and hot or warm beverages for the first 24 hours after the procedure.  Only take over-the-counter or prescription medicines for pain, discomfort, or fever as directed by your caregiver. You may resume taking your normal medicines unless your caregiver tells you otherwise. Ask your caregiver when you may resume taking medicines that may cause bleeding, such as aspirin, clopidogrel, or warfarin.  You may return to your normal diet and activities on the day after your procedure, or as directed by your caregiver. Walking may help to reduce any bloated feeling in your abdomen.  Drink enough fluids to keep your urine clear or pale yellow.  You may gargle with salt water if you have a sore throat. SEEK IMMEDIATE MEDICAL CARE IF:  You have severe nausea or vomiting.  You have severe abdominal pain, abdominal cramps that last longer than 6 hours, or abdominal swelling (distention).  You have severe shoulder or back pain.  You have trouble swallowing.  You have shortness of breath, your breathing is shallow, or you are breathing faster than normal.  You have a fever or a rapid heartbeat.  You vomit blood or material that looks like coffee grounds.  You have bloody, black, or tarry stools. MAKE SURE YOU:  Understand these instructions.  Will watch your condition.  Will get help right away if you are not doing  well or get worse.   This information is not intended to replace advice given to you by your health care provider. Make sure you discuss any questions you have with your health care provider.   Document Released: 03/22/2004 Document Revised: 08/29/2014 Document Reviewed: 11/08/2011 Elsevier Interactive Patient Education [employer]. Call if question or problem otherwise clear liquids only until 3 PM and if doing well may have soft solids the rest of today and may advance diet and activities tomorrow and we will call you with a surgical appointment soon and please call my nurse Pamala Hurry if you do not hear from [place] by Friday and we will schedule a lap appointment as well as within 1-2 weeks

## 2016-03-09 NOTE — Anesthesia Postprocedure Evaluation (Signed)
Anesthesia Post Note  Patient: Jasmine Bradford  Procedure(s) Performed: Procedure(s) (LRB): ENDOSCOPIC RETROGRADE CHOLANGIOPANCREATOGRAPHY (ERCP) (N/A)  Patient location during evaluation: PACU Anesthesia Type: General Level of consciousness: sedated and patient cooperative Pain management: pain level controlled Vital Signs Assessment: post-procedure vital signs reviewed and stable Respiratory status: spontaneous breathing Cardiovascular status: stable Anesthetic complications: no    Last Vitals:  Filed Vitals:   03/09/16 1030 03/09/16 1040  BP: 113/68 121/64  Pulse: 66 63  Temp:    Resp: 15 20    Last Pain:  Filed Vitals:   03/09/16 1042  PainSc: 0-No pain                 Nolon Nations

## 2016-03-10 ENCOUNTER — Encounter (HOSPITAL_COMMUNITY): Payer: Self-pay | Admitting: Gastroenterology

## 2016-03-30 ENCOUNTER — Ambulatory Visit: Payer: Self-pay | Admitting: General Surgery

## 2016-03-30 NOTE — H&P (Signed)
Jasmine Bradford 03/30/2016 11:11 AM Location: [facility] Patient #: [id number] DOB: 01/01/1956 Married / Language: English / Race: White Female  History of Present Illness Jasmine Hollingshead MD; 03/30/2016 11:59 AM) The patient is a 60 year old female.   Note:She is referred by Jasmine Bradford for consultation regarding cholelithiasis and choledocholithiasis. She was admitted to the hospital March 03, 2016 because of severe epigastric abdominal pain. She was noted to have elevated liver function tests. Ultrasound demonstrated gallstones as well as some dilated extrahepatic biliary ducts. MRI and MRCP demonstrated findings consistent with choledocholithiasis. She got well from this fairly quickly and their function tests started to go down. She subsequently underwent an outpatient ERCP and had become bile duct stones extracted after sphincterotomy was performed. She tolerated this well. She presents today to discuss cholecystectomy. She's not had any significant pain since the procedure. I have reviewed her MRI report and reviewed the ERCP films.  Other Problems Jasmine Bradford, CMA; 03/30/2016 11:12 AM) Cholelithiasis  Past Surgical History Jasmine Bradford, CMA; 03/30/2016 11:12 AM) Cesarean Section - 1  Diagnostic Studies History Jasmine Bradford, CMA; 03/30/2016 11:12 AM) Colonoscopy never Mammogram >3 years ago Pap Smear >5 years ago  Allergies Jasmine Bradford, CMA; 03/30/2016 11:12 AM) No Known Drug Allergies 03/30/2016  Medication History (Jasmine Bradford, CMA; 03/30/2016 11:12 AM) No Current Medications Medications Reconciled  Social History Jasmine Bradford, CMA; 03/30/2016 11:12 AM) Alcohol use Occasional alcohol use. Caffeine use Coffee, Tea. No drug use Tobacco use Former smoker.  Family History Jasmine Bradford, CMA; 03/30/2016 11:12 AM) Ovarian Cancer Mother.  Pregnancy / Birth History Jasmine Bradford, [place]; 03/30/2016 11:12 AM) Age at menarche 34 years. Age of menopause  75-50 Gravida 4 Maternal age 44-20 Para 4 Regular periods     Review of Systems (Jasmine Bradford; 03/30/2016 11:12 AM) General Not Present- Appetite Loss, Chills, Fatigue, Fever, Night Sweats, Weight Gain and Weight Loss. Skin Not Present- Change in Wart/Mole, Dryness, Hives, Jaundice, New Lesions, Non-Healing Wounds, Rash and Ulcer. Respiratory Not Present- Bloody sputum, Chronic Cough, Difficulty Breathing, Snoring and Wheezing. Breast Not Present- Breast Mass, Breast Pain, Nipple Discharge and Skin Changes. Cardiovascular Not Present- Chest Pain, Difficulty Breathing Lying Down, Leg Cramps, Palpitations, Rapid Heart Rate, Shortness of Breath and Swelling of Extremities. Gastrointestinal Not Present- Abdominal Pain, Bloating, Bloody Stool, Change in Bowel Habits, Chronic diarrhea, Constipation, Difficulty Swallowing, Excessive gas, Gets full quickly at meals, Hemorrhoids, Indigestion, Nausea, Rectal Pain and Vomiting. Female Genitourinary Not Present- Frequency, Nocturia, Painful Urination, Pelvic Pain and Urgency. Musculoskeletal Not Present- Back Pain, Joint Pain, Joint Stiffness, Muscle Pain, Muscle Weakness and Swelling of Extremities. Neurological Not Present- Decreased Memory, Fainting, Headaches, Numbness, Seizures, Tingling, Tremor, Trouble walking and Weakness. Psychiatric Not Present- Anxiety, Bipolar, Change in Sleep Pattern, Depression, Fearful and Frequent crying. Endocrine Not Present- Cold Intolerance, Excessive Hunger, Hair Changes, Heat Intolerance, Hot flashes and New Diabetes. Hematology Not Present- Blood Thinners, Easy Bruising, Excessive bleeding, Gland problems, HIV and Persistent Infections.  Vitals (Jasmine Bradford CMA; 03/30/2016 11:12 AM) 03/30/2016 11:12 AM Weight: 123 lb Height: 62in Body Surface Area: 1.55 m Body Mass Index: 22.5 kg/m  Temp.: 16F(Temporal)  Pulse: 79 (Regular)  BP: 122/80 (Sitting, Left Arm, Standard)      Physical Exam  Jasmine Hollingshead MD; 03/30/2016 12:04 PM)  The physical exam findings are as follows: Note:General: WDWN in NAD. Pleasant and cooperative.  HEENT: [place]/AT, no external nasal or ear masses, mucous membranes are moist  EYES: EOMI, no scleral icterus, pupils  normal  NECK: Supple, no obvious mass or thyroid mass/enlargement, no trachea deviation  CV: RRR, no murmur, no edema  CHEST: Breath sounds equal and clear. Respirations nonlabored.  ABDOMEN: Soft, nontender, nondistended, no masses, no organomegaly, active bowel sounds, no scars, no hernias.  SKIN: No jaundice.  NEUROLOGIC: Alert and oriented, answers questions appropriately.  PSYCHIATRIC: Normal mood, affect , and behavior.    Assessment & Plan Jasmine Hollingshead MD; 03/30/2016 12:05 PM)  CHOLELITHIASIS WITH CHOLEDOCHOLITHIASIS (K80.70) Impression: Status post successful ERCP with stone extraction. Currently asymptomatic.  Plan: Strict lowfat diet. I recommended laparoscopic cholecystectomy with cholangiogram. I have explained the procedure, risks, and aftercare of cholecystectomy. Risks include but are not limited to bleeding, infection, wound problems, anesthesia, diarrhea, bile leak, injury to common bile duct/liver/intestine. She seems to understand and agrees to proceed.  Jasmine Confer, MD

## 2016-05-13 ENCOUNTER — Encounter (HOSPITAL_COMMUNITY): Payer: Self-pay

## 2016-05-16 ENCOUNTER — Encounter (HOSPITAL_COMMUNITY): Payer: Self-pay

## 2016-05-16 ENCOUNTER — Encounter (HOSPITAL_COMMUNITY)
Admission: RE | Admit: 2016-05-16 | Discharge: 2016-05-16 | Disposition: A | Discharge status: Home / Self Care | Payer: 59 | Source: Ambulatory Visit | Attending: General Surgery | Admitting: General Surgery

## 2016-05-16 DIAGNOSIS — Z01812 Encounter for preprocedural laboratory examination: Secondary | ICD-10-CM | POA: Diagnosis present

## 2016-05-16 DIAGNOSIS — K8020 Calculus of gallbladder without cholecystitis without obstruction: Secondary | ICD-10-CM | POA: Diagnosis not present

## 2016-05-16 HISTORY — DX: Calculus of gallbladder and bile duct without cholecystitis without obstruction: K80.70

## 2016-05-16 HISTORY — DX: Presence of spectacles and contact lenses: Z97.3

## 2016-05-16 LAB — COMPREHENSIVE METABOLIC PANEL
ALT: 26 U/L (ref 14–54)
AST: 25 U/L (ref 15–41)
Albumin: 3.9 g/dL (ref 3.5–5.0)
Alkaline Phosphatase: 74 U/L (ref 38–126)
Anion gap: 7 (ref 5–15)
BUN: 12 mg/dL (ref 6–20)
CHLORIDE: 106 mmol/L (ref 101–111)
CO2: 27 mmol/L (ref 22–32)
CREATININE: 0.97 mg/dL (ref 0.44–1.00)
Calcium: 9.4 mg/dL (ref 8.9–10.3)
GFR calc Af Amer: >60 mL/min (ref >60)
GFR calc non Af Amer: >60 mL/min (ref >60)
Glucose, Bld: 87 mg/dL (ref 65–99)
Potassium: 4 mmol/L (ref 3.5–5.1)
SODIUM: 140 mmol/L (ref 135–145)
Total Bilirubin: 0.7 mg/dL (ref 0.3–1.2)
Total Protein: 6.6 g/dL (ref 6.5–8.1)

## 2016-05-16 LAB — CBC WITH DIFFERENTIAL/PLATELET
BASOS ABS: 0 10*3/uL (ref 0.0–0.1)
Basophils Relative: 0 %
EOS ABS: 0.1 10*3/uL (ref 0.0–0.7)
EOS PCT: 2 %
HCT: 42.9 % (ref 36.0–46.0)
Hemoglobin: 14.4 g/dL (ref 12.0–15.0)
Lymphocytes Relative: 33 %
Lymphs Abs: 1.6 10*3/uL (ref 0.7–4.0)
MCH: 29.1 pg (ref 26.0–34.0)
MCHC: 33.6 g/dL (ref 30.0–36.0)
MCV: 86.8 fL (ref 78.0–100.0)
Monocytes Absolute: 0.3 10*3/uL (ref 0.1–1.0)
Monocytes Relative: 7 %
Neutro Abs: 2.8 10*3/uL (ref 1.7–7.7)
Neutrophils Relative %: 58 %
PLATELETS: 185 10*3/uL (ref 150–400)
RBC: 4.94 MIL/uL (ref 3.87–5.11)
RDW: 12.7 % (ref 11.5–15.5)
WBC: 4.8 10*3/uL (ref 4.0–10.5)

## 2016-05-16 NOTE — Pre-Procedure Instructions (Signed)
    Jasmine Bradford  05/16/2016      [facility] [id number] - [place], [place] [address] [place] [address] Phone: [phone] Fax: [phone]  [place]), [place] - [id number] [place]) [place] [address] Phone: [phone] Fax: [phone]    Your procedure is scheduled on Monday, May 23, 2016  Report to [facility] Admitting at 10: 00 A.M.  Call this number if you have problems the morning of surgery:  [phone]   Remember:  Do not eat food or drink liquids after midnight Sunday, May 22, 2016  Take these medicines the morning of surgery with A SIP OF WATER: None Stop taking Aspirin, vitamins, fish oil and herbal medications. Do not take any NSAIDs ie: Ibuprofen, Advil, Naproxen, BC and Goody Powder or any medication containing Aspirin; stop now.  Do not wear jewelry, make-up or nail polish.  Do not wear lotions, powders, or perfumes, or deoderant.  Do not shave 48 hours prior to surgery.    Do not bring valuables to the hospital.  [facility] is not responsible for any belongings or valuables.  Contacts, dentures or bridgework may not be worn into surgery.  Leave your suitcase in the car.  After surgery it may be brought to your room.  For patients admitted to the hospital, discharge time will be determined by your treatment team.  Patients discharged the day of surgery will not be allowed to drive home.   Name and phone number of your driver: Special instructions: Shower the night before surgery and the morning of surgery with CHG.  Please read over the following fact sheets that you were given. Pain Booklet, Coughing and Deep Breathing and Surgical Site Infection Prevention

## 2016-05-16 NOTE — Progress Notes (Signed)
Pt denies SOB, chest pain, and being under the care of a cardiologist. Pt denies having a stress test and cardiac cath. Pt denies having any recent labs (within 14 days).

## 2016-05-22 MED ORDER — CEFAZOLIN SODIUM-DEXTROSE 2-4 GM/100ML-% IV SOLN
2.0000 g | INTRAVENOUS | Status: AC
  Administered 2016-05-23: 2 g via INTRAVENOUS
  Filled 2016-05-22: qty 100

## 2016-05-23 ENCOUNTER — Encounter (HOSPITAL_COMMUNITY): Payer: Self-pay | Admitting: *Deleted

## 2016-05-23 ENCOUNTER — Ambulatory Visit (HOSPITAL_COMMUNITY)
Admission: RE | Admit: 2016-05-23 | Discharge: 2016-05-23 | Disposition: A | Discharge status: Home / Self Care | Payer: 59 | Source: Ambulatory Visit | Attending: General Surgery | Admitting: General Surgery

## 2016-05-23 ENCOUNTER — Ambulatory Visit (HOSPITAL_COMMUNITY): Payer: 59

## 2016-05-23 ENCOUNTER — Ambulatory Visit (HOSPITAL_COMMUNITY): Payer: 59 | Admitting: Anesthesiology

## 2016-05-23 ENCOUNTER — Encounter (HOSPITAL_COMMUNITY)
Admission: RE | Disposition: A | Discharge status: Home / Self Care | Payer: Self-pay | Source: Ambulatory Visit | Attending: General Surgery

## 2016-05-23 DIAGNOSIS — K8010 Calculus of gallbladder with chronic cholecystitis without obstruction: Secondary | ICD-10-CM | POA: Insufficient documentation

## 2016-05-23 DIAGNOSIS — Z87891 Personal history of nicotine dependence: Secondary | ICD-10-CM | POA: Insufficient documentation

## 2016-05-23 DIAGNOSIS — K8070 Calculus of gallbladder and bile duct without cholecystitis without obstruction: Secondary | ICD-10-CM

## 2016-05-23 HISTORY — PX: CHOLECYSTECTOMY: SHX55

## 2016-05-23 SURGERY — LAPAROSCOPIC CHOLECYSTECTOMY WITH INTRAOPERATIVE CHOLANGIOGRAM
Anesthesia: General | Site: Abdomen

## 2016-05-23 MED ORDER — SODIUM CHLORIDE 0.9 % IR SOLN
Status: DC | PRN
  Administered 2016-05-23: 1

## 2016-05-23 MED ORDER — BUPIVACAINE-EPINEPHRINE (PF) 0.25% -1:200000 IJ SOLN
INTRAMUSCULAR | Status: AC
  Filled 2016-05-23: qty 30

## 2016-05-23 MED ORDER — OXYCODONE HCL 5 MG PO TABS: 5.0000 mg | ORAL_TABLET | ORAL | 0 refills | Status: DC | PRN

## 2016-05-23 MED ORDER — EPHEDRINE 5 MG/ML INJ
INTRAVENOUS | Status: AC
  Filled 2016-05-23: qty 10

## 2016-05-23 MED ORDER — 0.9 % SODIUM CHLORIDE (POUR BTL) OPTIME
TOPICAL | Status: DC | PRN
  Administered 2016-05-23: 1000 mL

## 2016-05-23 MED ORDER — LIDOCAINE HCL (CARDIAC) 20 MG/ML IV SOLN
INTRAVENOUS | Status: DC | PRN
  Administered 2016-05-23: 100 mg via INTRAVENOUS

## 2016-05-23 MED ORDER — CHLORHEXIDINE GLUCONATE CLOTH 2 % EX PADS: 6.0000 | MEDICATED_PAD | Freq: Once | CUTANEOUS | Status: DC

## 2016-05-23 MED ORDER — MIDAZOLAM HCL 5 MG/5ML IJ SOLN
INTRAMUSCULAR | Status: DC | PRN
  Administered 2016-05-23: 1 mg via INTRAVENOUS

## 2016-05-23 MED ORDER — LIDOCAINE 2% (20 MG/ML) 5 ML SYRINGE
INTRAMUSCULAR | Status: AC
  Filled 2016-05-23: qty 5

## 2016-05-23 MED ORDER — ACETAMINOPHEN 650 MG RE SUPP: 650.0000 mg | RECTAL | Status: DC | PRN

## 2016-05-23 MED ORDER — DEXTROSE IN LACTATED RINGERS 5 % IV SOLN
INTRAVENOUS | Status: DC
Start: 2016-05-23 — End: 2016-05-23

## 2016-05-23 MED ORDER — PROPOFOL 10 MG/ML IV BOLUS
INTRAVENOUS | Status: DC | PRN
  Administered 2016-05-23: 20 mg via INTRAVENOUS
  Administered 2016-05-23: 90 mg via INTRAVENOUS

## 2016-05-23 MED ORDER — IOPAMIDOL (ISOVUE-300) INJECTION 61%
INTRAVENOUS | Status: AC
  Filled 2016-05-23: qty 50

## 2016-05-23 MED ORDER — SODIUM CHLORIDE 0.9 % IV SOLN
INTRAVENOUS | Status: DC | PRN
  Administered 2016-05-23: 9 mL

## 2016-05-23 MED ORDER — BUPIVACAINE-EPINEPHRINE 0.25% -1:200000 IJ SOLN
INTRAMUSCULAR | Status: DC | PRN
  Administered 2016-05-23: 12 mL

## 2016-05-23 MED ORDER — MIDAZOLAM HCL 2 MG/2ML IJ SOLN
INTRAMUSCULAR | Status: AC
  Filled 2016-05-23: qty 2

## 2016-05-23 MED ORDER — ROCURONIUM BROMIDE 100 MG/10ML IV SOLN
INTRAVENOUS | Status: DC | PRN
  Administered 2016-05-23: 50 mg via INTRAVENOUS

## 2016-05-23 MED ORDER — ROCURONIUM BROMIDE 10 MG/ML (PF) SYRINGE
PREFILLED_SYRINGE | INTRAVENOUS | Status: AC
  Filled 2016-05-23: qty 10

## 2016-05-23 MED ORDER — LACTATED RINGERS IV SOLN
INTRAVENOUS | Status: DC
  Administered 2016-05-23 (×2): via INTRAVENOUS

## 2016-05-23 MED ORDER — MORPHINE SULFATE (PF) 2 MG/ML IV SOLN: 2.0000 mg | INTRAVENOUS | Status: DC | PRN

## 2016-05-23 MED ORDER — FENTANYL CITRATE (PF) 100 MCG/2ML IJ SOLN
INTRAMUSCULAR | Status: AC
  Administered 2016-05-23: 50 ug via INTRAVENOUS
  Filled 2016-05-23: qty 2

## 2016-05-23 MED ORDER — ONDANSETRON HCL 4 MG/2ML IJ SOLN
INTRAMUSCULAR | Status: DC | PRN
  Administered 2016-05-23: 4 mg via INTRAVENOUS

## 2016-05-23 MED ORDER — FENTANYL CITRATE (PF) 100 MCG/2ML IJ SOLN
INTRAMUSCULAR | Status: DC | PRN
Start: 2016-05-23 — End: 2016-05-23
  Administered 2016-05-23 (×4): 50 ug via INTRAVENOUS

## 2016-05-23 MED ORDER — EPHEDRINE SULFATE 50 MG/ML IJ SOLN
INTRAMUSCULAR | Status: DC | PRN
  Administered 2016-05-23: 10 mg via INTRAVENOUS

## 2016-05-23 MED ORDER — HEMOSTATIC AGENTS (NO CHARGE) OPTIME
TOPICAL | Status: DC | PRN
  Administered 2016-05-23: 1 via TOPICAL

## 2016-05-23 MED ORDER — ACETAMINOPHEN 325 MG PO TABS: 650.0000 mg | ORAL_TABLET | ORAL | Status: DC | PRN

## 2016-05-23 MED ORDER — FENTANYL CITRATE (PF) 100 MCG/2ML IJ SOLN
INTRAMUSCULAR | Status: AC
  Filled 2016-05-23: qty 4

## 2016-05-23 MED ORDER — DEXAMETHASONE SODIUM PHOSPHATE 10 MG/ML IJ SOLN
INTRAMUSCULAR | Status: AC
  Filled 2016-05-23: qty 1

## 2016-05-23 MED ORDER — OXYCODONE HCL 5 MG PO TABS: 5.0000 mg | ORAL_TABLET | ORAL | Status: DC | PRN

## 2016-05-23 MED ORDER — ONDANSETRON HCL 4 MG PO TABS: 4.0000 mg | ORAL_TABLET | ORAL | 0 refills | Status: DC | PRN

## 2016-05-23 MED ORDER — FENTANYL CITRATE (PF) 100 MCG/2ML IJ SOLN
25.0000 ug | INTRAMUSCULAR | Status: DC | PRN
  Administered 2016-05-23: 50 ug via INTRAVENOUS

## 2016-05-23 MED ORDER — ONDANSETRON HCL 4 MG/2ML IJ SOLN
INTRAMUSCULAR | Status: AC
Start: 2016-05-23 — End: 2016-05-23
  Filled 2016-05-23: qty 2

## 2016-05-23 MED ORDER — PROPOFOL 10 MG/ML IV BOLUS
INTRAVENOUS | Status: AC
  Filled 2016-05-23: qty 20

## 2016-05-23 MED ORDER — SUGAMMADEX SODIUM 200 MG/2ML IV SOLN
INTRAVENOUS | Status: DC | PRN
  Administered 2016-05-23: 110 mg via INTRAVENOUS

## 2016-05-23 MED ORDER — SODIUM CHLORIDE 0.9% FLUSH: 3.0000 mL | INTRAVENOUS | Status: DC | PRN

## 2016-05-23 MED ORDER — SUGAMMADEX SODIUM 200 MG/2ML IV SOLN
INTRAVENOUS | Status: AC
  Filled 2016-05-23: qty 2

## 2016-05-23 MED ORDER — ACETAMINOPHEN 500 MG PO TABS: 1000.0000 mg | ORAL_TABLET | Freq: Once | ORAL | Status: DC

## 2016-05-23 MED ORDER — DEXAMETHASONE SODIUM PHOSPHATE 10 MG/ML IJ SOLN
INTRAMUSCULAR | Status: DC | PRN
  Administered 2016-05-23: 5 mg via INTRAVENOUS

## 2016-05-23 SURGICAL SUPPLY — 47 items
APPLIER CLIP 5 13 M/L LIGAMAX5 (MISCELLANEOUS) ×3
BENZOIN TINCTURE PRP APPL 2/3 (GAUZE/BANDAGES/DRESSINGS) ×3 IMPLANT
CANISTER SUCTION 2500CC (MISCELLANEOUS) ×3 IMPLANT
CATH REDDICK CHOLANGI 4FR 50CM (CATHETERS) ×3 IMPLANT
CHLORAPREP W/TINT 26ML (MISCELLANEOUS) ×3 IMPLANT
CLIP APPLIE 5 13 M/L LIGAMAX5 (MISCELLANEOUS) ×1 IMPLANT
CLOSURE WOUND 1/2 X4 (GAUZE/BANDAGES/DRESSINGS) ×1
COVER MAYO STAND STRL (DRAPES) ×3 IMPLANT
COVER SURGICAL LIGHT HANDLE (MISCELLANEOUS) ×3 IMPLANT
DECANTER SPIKE VIAL GLASS SM (MISCELLANEOUS) ×3 IMPLANT
DRAPE C-ARM 42X72 X-RAY (DRAPES) ×3 IMPLANT
DRSG TEGADERM 2-3/8X2-3/4 SM (GAUZE/BANDAGES/DRESSINGS) ×12 IMPLANT
ELECT REM PT RETURN 9FT ADLT (ELECTROSURGICAL) ×3
ELECTRODE REM PT RTRN 9FT ADLT (ELECTROSURGICAL) ×1 IMPLANT
ENDOLOOP SUT PDS II  0 18 (SUTURE) ×4
ENDOLOOP SUT PDS II 0 18 (SUTURE) ×2 IMPLANT
GAUZE SPONGE 2X2 8PLY STRL LF (GAUZE/BANDAGES/DRESSINGS) ×1 IMPLANT
GLOVE BIOGEL PI IND STRL 7.0 (GLOVE) ×1 IMPLANT
GLOVE BIOGEL PI IND STRL 8 (GLOVE) ×1 IMPLANT
GLOVE BIOGEL PI IND STRL 8.5 (GLOVE) ×1 IMPLANT
GLOVE BIOGEL PI INDICATOR 7.0 (GLOVE) ×2
GLOVE BIOGEL PI INDICATOR 8 (GLOVE) ×2
GLOVE BIOGEL PI INDICATOR 8.5 (GLOVE) ×2
GLOVE ECLIPSE 6.5 STRL STRAW (GLOVE) ×3 IMPLANT
GLOVE ECLIPSE 8.0 STRL XLNG CF (GLOVE) ×3 IMPLANT
GOWN STRL REUS W/ TWL LRG LVL3 (GOWN DISPOSABLE) ×3 IMPLANT
GOWN STRL REUS W/TWL LRG LVL3 (GOWN DISPOSABLE) ×6
IV CATH 14GX2 1/4 (CATHETERS) ×3 IMPLANT
KIT BASIN OR (CUSTOM PROCEDURE TRAY) ×3 IMPLANT
KIT ROOM TURNOVER OR (KITS) ×3 IMPLANT
NS IRRIG 1000ML POUR BTL (IV SOLUTION) ×3 IMPLANT
PAD ARMBOARD 7.5X6 YLW CONV (MISCELLANEOUS) ×3 IMPLANT
POUCH SPECIMEN RETRIEVAL 10MM (ENDOMECHANICALS) ×3 IMPLANT
SCISSORS LAP 5X35 DISP (ENDOMECHANICALS) ×3 IMPLANT
SET IRRIG TUBING LAPAROSCOPIC (IRRIGATION / IRRIGATOR) ×3 IMPLANT
SLEEVE ENDOPATH XCEL 5M (ENDOMECHANICALS) ×6 IMPLANT
SPECIMEN JAR SMALL (MISCELLANEOUS) ×3 IMPLANT
SPONGE GAUZE 2X2 STER 10/PKG (GAUZE/BANDAGES/DRESSINGS) ×2
STRIP CLOSURE SKIN 1/2X4 (GAUZE/BANDAGES/DRESSINGS) ×2 IMPLANT
SUT MON AB 4-0 PC3 18 (SUTURE) ×3 IMPLANT
TOWEL OR 17X24 6PK STRL BLUE (TOWEL DISPOSABLE) ×3 IMPLANT
TOWEL OR 17X26 10 PK STRL BLUE (TOWEL DISPOSABLE) ×3 IMPLANT
TRAY LAPAROSCOPIC MC (CUSTOM PROCEDURE TRAY) ×3 IMPLANT
TROCAR XCEL BLUNT TIP 100MML (ENDOMECHANICALS) ×3 IMPLANT
TROCAR XCEL NON-BLD 11X100MML (ENDOMECHANICALS) IMPLANT
TROCAR XCEL NON-BLD 5MMX100MML (ENDOMECHANICALS) ×3 IMPLANT
TUBING INSUFFLATION (TUBING) ×3 IMPLANT

## 2016-05-23 NOTE — Transfer of Care (Signed)
Immediate Anesthesia Transfer of Care Note  Patient: Jasmine Bradford  Procedure(s) Performed: Procedure(s): LAPAROSCOPIC CHOLECYSTECTOMY WITH INTRAOPERATIVE CHOLANGIOGRAM (N/A)  Patient Location: PACU  Anesthesia Type:General  Level of Consciousness: awake, alert , oriented and patient cooperative  Airway & Oxygen Therapy: Patient Spontanous Breathing and Patient connected to nasal cannula oxygen  Post-op Assessment: Report given to RN, Post -op Vital signs reviewed and stable and Patient moving all extremities  Post vital signs: Reviewed and stable  Last Vitals:  Vitals:   05/23/16 1018  BP: (!) 149/72  Pulse: (!) 58  Resp: 20  Temp: 36.7 C    Last Pain:  Vitals:   05/23/16 1041  TempSrc:   PainSc: 3       Patients Stated Pain Goal: 4 ([id number])  Complications: No apparent anesthesia complications

## 2016-05-23 NOTE — H&P (Signed)
Jasmine Bradford is an 60 y.o. female.   Chief Complaint:   She presents for elective cholecystectomy HPI:  She has cholelithiasis and had choledocholithiasis as well.  She is s/p ERCP and CBD stone extraction.  She now presents for elective cholecystectomy.  Past Medical History:  Diagnosis Date  . Cholelithiasis with choledocholithiasis   . Tobacco abuse   . Varicose vein of leg    no problems now  . Wears glasses     Past Surgical History:  Procedure Laterality Date  . CESAREAN SECTION    . ERCP N/A 03/09/2016   Procedure: ENDOSCOPIC RETROGRADE CHOLANGIOPANCREATOGRAPHY (ERCP);  Surgeon: Clarene Essex, MD;  Location: [facility];  Service: Endoscopy;  Laterality: N/A;  . laser vein surgery     last year  . TUBAL LIGATION    . VARICOSE VEIN SURGERY Left     Family History  Problem Relation Age of Onset  . Memory loss Mother    Social History:  reports that she has quit smoking. Her smoking use included Cigarettes. She has a 17.50 pack-year smoking history. She has never used smokeless tobacco. She reports that she does not drink alcohol or use drugs.  Allergies:  Allergies  Allergen Reactions  . No Known Allergies     No prescriptions prior to admission.    No results found for this or any previous visit (from the past 48 hour(s)). No results found.  Review of Systems  Constitutional: Negative for chills and fever.  Gastrointestinal: Negative for abdominal pain.    Blood pressure (!) 149/72, pulse (!) 58, temperature 98.1 F (36.7 C), temperature source Oral, resp. rate 20, height 5\' 2"  (1.575 m), weight 54.2 kg (119 lb 6.4 oz), SpO2 99 %. Physical Exam  Constitutional: She appears well-developed and well-nourished. No distress.  Eyes: No scleral icterus.  Cardiovascular: Normal rate and regular rhythm.   Respiratory: Effort normal and breath sounds normal.  GI: Soft. There is no tenderness.  Skin: Skin is warm and dry.     Assessment/Plan Cholelithiasis and  choledocholithiasis s/p ERCP  Plan:  Laparoscopic cholecystectomy and cholangiogram.  [name], MD 05/23/2016, 12:57 PM

## 2016-05-23 NOTE — Interval H&P Note (Signed)
History and Physical Interval Note:  05/23/2016 1:02 PM  Jasmine Bradford  has presented today for surgery, with the diagnosis of Cholelithiasis, choledocholithiasis  The various methods of treatment have been discussed with the patient and family. After consideration of risks, benefits and other options for treatment, the patient has consented to  Procedure(s): LAPAROSCOPIC CHOLECYSTECTOMY WITH INTRAOPERATIVE CHOLANGIOGRAM (N/A) as a surgical intervention .  The patient's history has been reviewed, patient examined, no change in status, stable for surgery.  I have reviewed the patient's chart and labs.  Questions were answered to the patient's satisfaction.     Armari Fussell Lenna Sciara

## 2016-05-23 NOTE — Op Note (Signed)
Operative Note  Jasmine Bradford female 60 y.o. 05/23/2016  PREOPERATIVE DX:  Cholelithiasis and choledocholithiasis s/p ERCP  POSTOPERATIVE DX:  Same  PROCEDURE:   Laparoscopic cholecystectomy with IOC         Surgeon: [name]   Assistants: Excell Seltzer, M.D.  Anesthesia: General endotracheal anesthesia  Indications:   This is a 60 year old female with cholelithiasis and choledocholithiasis.  She underwent ERCP and stone extraction by Dr. Watt Climes.  She now presents for elective cholecystectomy.    Procedure Detail:   She was brought to the operating room, placed supine on the operating table, and a general anesthetic was administered. The abdominal wall was then sterilely prepped and draped.  A timeout was performed.    Local anesthetic (Marcaine) was infiltrated in the subumbilical region. A small subumbilical incision was made through the skin, subcutaneous tissue, fascia, and peritoneum entering the peritoneal cavity under direct vision. A pursestring suture of 0 Vicryl was placed around the edges of the fascia. A Hassan trocar was introduced into the peritoneal cavity and a pneumoperitoneum was created by insufflation of carbon dioxide gas. The laparoscope was introduced into the trocar and no underlying bleeding or organ injury was noted. She was then placed in the reverse Trendelenburg position with the right side tilted slightly up.  Three 5 mm trocars were then placed into the abdominal cavity under laparoscopic vision. One in the epigastric area, and 2 in the right upper quadrant area. The gallbladder was visualized and adhesions between  the fundus and omentum were separated.  The entire gallbladder was thickened.  The fundus was grasped and retracted toward the right shoulder. The The infundibulum was mobilized with dissection close to the gallbladder and retracted laterally. The cystic duct was identified and a window was created around it. The cystic artery  branches, anterior and posterior were  also identified and window were created around them. The critical view was achieved. A clip was placed at the neck of the gallbladder. A small incision was made in the cystic duct. A cholangiocatheter was introduced through the anterior abdominal wall and placed in the cystic duct. A intraoperative cholangiogram was then performed.  Under real-time fluoroscopy, dilute contrast was injected into the cystic duct.  The common hepatic duct, the right and left hepatic ducts, and the common duct were all visualized. Contrast drained into the duodenum without obvious evidence of any obstructing ductal lesion. The final report is pending the Radiologist's interpretation.  The cholangiocatheter was removed, the cystic duct was clipped once on the biliary side and divided.  Then two PDS endoloops were placed on the cystic duct stump. No bile leak was noted from the cystic duct stump.  The cystic artery branches were then clipped and divided. Following this the gallbladder was dissected free from the liver using electrocautery. The gallbladder was then placed in a retrieval bag and removed from the abdominal cavity through the subumbilical incision.  The gallbladder fossa was inspected, irrigated, and bleeding was controlled with electrocautery. Inspection showed that hemostasis was adequate and there was no evidence of bile leak. Surgicel was placed in the gallbladder fossa. The irrigation fluid was evacuated as much as possible.  The subumbilical trocar was removed and the fascial defect was closed by tightening and tying down the pursestring suture under laparoscopic vision.  The remaining trocars were removed and the pneumoperitoneum was released. The skin incisions were closed with 4-0 Monocryl subcuticular stitches. Steri-Strips and sterile dressings were applied.  The procedure was  well-tolerated without any apparent complications.She was taken to the recovery room in  satisfactory condition.         Specimens: gallbladder        Complications:  * No complications entered in OR log *         Disposition: PACU - hemodynamically stable.         Condition: stable

## 2016-05-23 NOTE — Anesthesia Preprocedure Evaluation (Addendum)
Anesthesia Evaluation  Patient identified by MRN, date of birth, ID band Patient awake    Reviewed: Allergy & Precautions, H&P , Patient's Chart, lab work & pertinent test results, reviewed documented beta blocker date and time   Airway Mallampati: II  TM Distance: >3 FB Neck ROM: full    Dental no notable dental hx. (+) Dental Advisory Given   Pulmonary former smoker,    Pulmonary exam normal breath sounds clear to auscultation       Cardiovascular  Rhythm:regular Rate:Normal     Neuro/Psych    GI/Hepatic   Endo/Other    Renal/GU      Musculoskeletal   Abdominal   Peds  Hematology   Anesthesia Other Findings Teeth are solidly in place according to pt, but there are serious wear patterns on most teeth, halfway down most teeth.  Reproductive/Obstetrics                            Anesthesia Physical Anesthesia Plan  ASA: II  Anesthesia Plan: General   Post-op Pain Management:    Induction: Intravenous  Airway Management Planned: Oral ETT  Additional Equipment:   Intra-op Plan:   Post-operative Plan: Extubation in OR  Informed Consent: I have reviewed the patients History and Physical, chart, labs and discussed the procedure including the risks, benefits and alternatives for the proposed anesthesia with the patient or authorized representative who has indicated his/her understanding and acceptance.   Dental Advisory Given and Dental advisory given  Plan Discussed with: CRNA and Surgeon  Anesthesia Plan Comments: (  Discussed general anesthesia, including possible nausea, instrumentation of airway, sore throat,pulmonary aspiration, etc. I asked if the were any outstanding questions, or  concerns before we proceeded. )        Anesthesia Quick Evaluation

## 2016-05-23 NOTE — Anesthesia Procedure Notes (Signed)
Procedure Name: Intubation Date/Time: 05/23/2016 1:20 PM Performed by: Williemae Area B Pre-anesthesia Checklist: Patient identified, Emergency Drugs available, Suction available and Patient being monitored Patient Re-evaluated:Patient Re-evaluated prior to inductionOxygen Delivery Method: Circle system utilized Preoxygenation: Pre-oxygenation with 100% oxygen Intubation Type: IV induction Ventilation: Mask ventilation without difficulty Laryngoscope Size: Mac and 3 (Thin cloth tape folded over upper teeth which are worn and fragile in appearance) Grade View: Grade I Tube type: Oral Tube size: 7.5 mm Number of attempts: 1 Airway Equipment and Method: Stylet Placement Confirmation: ETT inserted through vocal cords under direct vision,  positive ETCO2 and breath sounds checked- equal and bilateral Secured at: 21 (cm at teeth) cm Tube secured with: Tape Dental Injury: Teeth and Oropharynx as per pre-operative assessment

## 2016-05-23 NOTE — Discharge Instructions (Addendum)
LAPAROSCOPIC SURGERY: POST OP INSTRUCTIONS  1. DIET: Follow a liquid diet the first 24 hours after arrival home, such as soup, liquids, crackers, etc.  Be sure to include lots of fluids daily.  Avoid fast food or heavy meals as your are more likely to get nauseated.  Eat a low fat the next few days after surgery.   2. Take your usually prescribed home medications unless otherwise directed. 3. PAIN CONTROL: a. Pain is best controlled by a usual combination of three different methods TOGETHER: i. Ice/Heat ii. Over the counter pain medication iii. Prescription pain medication b. Most patients will experience some swelling and bruising around the incisions.  Ice packs or heating pads (30-60 minutes up to 6 times a day) will help. Use ice for the first few days to help decrease swelling and bruising, then switch to heat to help relax tight/sore spots and speed recovery.  Some people prefer to use ice alone, heat alone, alternating between ice & heat.  Experiment to what works for you.  Swelling and bruising can take several weeks to resolve.   c. It is helpful to take an over-the-counter pain medication regularly for the first few weeks.  Choose one of the following that works best for you: i. Naproxen (Aleve, etc)  Two 220mg  tabs twice a day ii. Ibuprofen (Advil, etc) Three 200mg  tabs four times a day (every meal & bedtime) iii. Acetaminophen (Tylenol, etc) 500-650mg  four times a day (every meal & bedtime) d. A  prescription for pain medication (such as oxycodone, hydrocodone, etc) should be given to you upon discharge.  Take your pain medication as prescribed.  i. If you are having problems/concerns with the prescription medicine (does not control pain, nausea, vomiting, rash, itching, etc), please call us [phone] to see if we need to switch you to a different pain medicine that will work better for you and/or control your side effect better. ii. If you need a refill on your pain medication,  please contact your pharmacy.  They will contact our office to request authorization. Prescriptions will not be filled after 5 pm or on week-ends. 4. Avoid getting constipated.  Between the surgery and the pain medications, it is common to experience some constipation.  Increasing fluid intake and taking a fiber supplement (such as Metamucil, Citrucel, FiberCon, MiraLax, etc) 1-2 times a day regularly will usually help prevent this problem from occurring.  A mild laxative (prune juice, Milk of Magnesia, MiraLax, etc) should be taken according to package directions if there are no bowel movements after 48 hours.   5. Watch out for diarrhea.  If you have many loose bowel movements, simplify your diet to bland foods & liquids for a few days.  Stop any stool softeners and decrease your fiber supplement.  Switching to mild anti-diarrheal medications (Kayopectate, Pepto Bismol) can help.  If this worsens or does not improve, please call us. 6. Wash / shower every day.  You may shower over the dressings as they are waterproof.  Continue to shower over incision(s) after the dressing is off. 7. Remove your waterproof bandages 3 days after surgery.  You may leave the incision open to air.  You may replace a dressing/Band-Aid to cover the incision for comfort if you wish.  8. ACTIVITIES as tolerated:   a. You may resume regular (light) daily activities beginning the next day--such as daily self-care, walking, climbing stairs--gradually increasing light activities as tolerated.  No heavy lifting (over 10 pounds), straining, or intense  activities for 2 weeks. b. DO NOT PUSH THROUGH PAIN.  Let pain be your guide: If it hurts to do something, don't do it.  Pain is your body warning you to avoid that activity for another week until the pain goes down. c. You may drive when you are no longer taking prescription pain medication, you can comfortably wear a seatbelt, and you can safely maneuver your car and apply  brakes. d. Dennis Bast may have sexual intercourse when it is comfortable.  9. FOLLOW UP in our office a. Please call [facility] at [phone] to set up an appointment to see your surgeon in the office for a follow-up appointment approximately 2-3 weeks after your surgery. b. Make sure that you call for this appointment the day you arrive home to insure a convenient appointment time. 10. IF YOU HAVE DISABILITY OR FAMILY LEAVE FORMS, BRING THEM TO THE OFFICE FOR PROCESSING.  DO NOT GIVE THEM TO YOUR DOCTOR.  11.  Return to work/school:  Desk work/light activities in 5-7 days, full duty/activities in 2 weeks if pain-free.   WHEN TO CALL us [phone]: 1. Poor pain control 2. Reactions / problems with new medications (rash/itching, nausea, etc)  3. Fever over 101.5 F (38.5 C) 4. Inability to urinate 5. Nausea and/or vomiting 6. Worsening swelling or bruising 7. Continued bleeding from incision. 8. Increased pain, redness, or drainage from the incision   The clinic staff is available to answer your questions during regular business hours (8:30am-5pm).  Please dont hesitate to call and ask to speak to one of our nurses for clinical concerns.   If you have a medical emergency, go to the nearest emergency room or call 911.  A surgeon from [facility] is always on call at the [facility], [place], [place], [place], [place]  [id number] ? MAIN: [phone] ? TOLL FREE: [phone] ?  FAX [phone] [email or web address]

## 2016-05-24 ENCOUNTER — Encounter (HOSPITAL_COMMUNITY): Payer: Self-pay | Admitting: General Surgery

## 2016-05-24 NOTE — Anesthesia Postprocedure Evaluation (Signed)
Anesthesia Post Note  Patient: Jasmine Bradford  Procedure(s) Performed: Procedure(s) (LRB): LAPAROSCOPIC CHOLECYSTECTOMY WITH INTRAOPERATIVE CHOLANGIOGRAM (N/A)  Patient location during evaluation: PACU Anesthesia Type: General Level of consciousness: sedated Pain management: satisfactory to patient Vital Signs Assessment: post-procedure vital signs reviewed and stable Respiratory status: spontaneous breathing Cardiovascular status: stable Anesthetic complications: no     Last Vitals:  Vitals:   05/23/16 1545 05/23/16 1602  BP: 105/61 109/62  Pulse: 69 66  Resp: 17 16  Temp: 36.2 C     Last Pain:  Vitals:   05/23/16 1602  TempSrc:   PainSc: 4    Pain Goal: Patients Stated Pain Goal: 3 (05/23/16 1545)               Lyndle Herrlich EDWARD

## 2016-06-04 ENCOUNTER — Emergency Department (HOSPITAL_COMMUNITY): Payer: 59

## 2016-06-04 ENCOUNTER — Inpatient Hospital Stay (HOSPITAL_COMMUNITY)
Admission: EM | Admit: 2016-06-04 | Discharge: 2016-06-07 | DRG: 446 | Disposition: A | Discharge status: Home / Self Care | Payer: 59 | Attending: General Surgery | Admitting: General Surgery

## 2016-06-04 ENCOUNTER — Encounter (HOSPITAL_COMMUNITY): Payer: Self-pay | Admitting: Emergency Medicine

## 2016-06-04 DIAGNOSIS — K8070 Calculus of gallbladder and bile duct without cholecystitis without obstruction: Principal | ICD-10-CM | POA: Diagnosis present

## 2016-06-04 DIAGNOSIS — Z23 Encounter for immunization: Secondary | ICD-10-CM

## 2016-06-04 DIAGNOSIS — Z79899 Other long term (current) drug therapy: Secondary | ICD-10-CM

## 2016-06-04 DIAGNOSIS — K8050 Calculus of bile duct without cholangitis or cholecystitis without obstruction: Secondary | ICD-10-CM

## 2016-06-04 DIAGNOSIS — Z9049 Acquired absence of other specified parts of digestive tract: Secondary | ICD-10-CM

## 2016-06-04 DIAGNOSIS — K831 Obstruction of bile duct: Secondary | ICD-10-CM | POA: Diagnosis present

## 2016-06-04 DIAGNOSIS — Z6821 Body mass index (BMI) 21.0-21.9, adult: Secondary | ICD-10-CM

## 2016-06-04 DIAGNOSIS — K828 Other specified diseases of gallbladder: Secondary | ICD-10-CM | POA: Diagnosis present

## 2016-06-04 DIAGNOSIS — R1010 Upper abdominal pain, unspecified: Secondary | ICD-10-CM

## 2016-06-04 DIAGNOSIS — Z87891 Personal history of nicotine dependence: Secondary | ICD-10-CM

## 2016-06-04 DIAGNOSIS — R1011 Right upper quadrant pain: Secondary | ICD-10-CM | POA: Diagnosis not present

## 2016-06-04 DIAGNOSIS — Z885 Allergy status to narcotic agent status: Secondary | ICD-10-CM

## 2016-06-04 LAB — CBC
HCT: 42.1 % (ref 36.0–46.0)
HEMOGLOBIN: 14.3 g/dL (ref 12.0–15.0)
MCH: 28.6 pg (ref 26.0–34.0)
MCHC: 34 g/dL (ref 30.0–36.0)
MCV: 84.2 fL (ref 78.0–100.0)
Platelets: 229 10*3/uL (ref 150–400)
RBC: 5 MIL/uL (ref 3.87–5.11)
RDW: 12.4 % (ref 11.5–15.5)
WBC: 9.6 10*3/uL (ref 4.0–10.5)

## 2016-06-04 LAB — COMPREHENSIVE METABOLIC PANEL
ALK PHOS: 240 U/L — AB (ref 38–126)
ALT: 384 U/L — ABNORMAL HIGH (ref 14–54)
ANION GAP: 11 (ref 5–15)
AST: 636 U/L — ABNORMAL HIGH (ref 15–41)
Albumin: 3.9 g/dL (ref 3.5–5.0)
BUN: 13 mg/dL (ref 6–20)
CALCIUM: 9.1 mg/dL (ref 8.9–10.3)
CO2: 25 mmol/L (ref 22–32)
Chloride: 101 mmol/L (ref 101–111)
Creatinine, Ser: 0.94 mg/dL (ref 0.44–1.00)
GFR calc non Af Amer: >60 mL/min (ref >60)
Glucose, Bld: 151 mg/dL — ABNORMAL HIGH (ref 65–99)
POTASSIUM: 3.8 mmol/L (ref 3.5–5.1)
SODIUM: 137 mmol/L (ref 135–145)
TOTAL PROTEIN: 6.9 g/dL (ref 6.5–8.1)
Total Bilirubin: 1.5 mg/dL — ABNORMAL HIGH (ref 0.3–1.2)

## 2016-06-04 LAB — I-STAT CG4 LACTIC ACID, ED: LACTIC ACID, VENOUS: 1.04 mmol/L (ref 0.5–1.9)

## 2016-06-04 LAB — URINE MICROSCOPIC-ADD ON: RBC / HPF: NONE SEEN RBC/hpf (ref 0–5)

## 2016-06-04 LAB — URINALYSIS, ROUTINE W REFLEX MICROSCOPIC
Glucose, UA: NEGATIVE mg/dL
Hgb urine dipstick: NEGATIVE
Ketones, ur: NEGATIVE mg/dL
NITRITE: POSITIVE — AB
Protein, ur: NEGATIVE mg/dL
SPECIFIC GRAVITY, URINE: 1.017 (ref 1.005–1.030)
pH: 6.5 (ref 5.0–8.0)

## 2016-06-04 LAB — LIPASE, BLOOD: Lipase: 16 U/L (ref 11–51)

## 2016-06-04 MED ORDER — IOPAMIDOL (ISOVUE-300) INJECTION 61%
INTRAVENOUS | Status: AC
  Administered 2016-06-04: 100 mL
  Filled 2016-06-04: qty 100

## 2016-06-04 MED ORDER — SODIUM CHLORIDE 0.9 % IV BOLUS (SEPSIS)
1000.0000 mL | Freq: Once | INTRAVENOUS | Status: AC
  Administered 2016-06-04: 1000 mL via INTRAVENOUS

## 2016-06-04 MED ORDER — HYDROMORPHONE HCL 1 MG/ML IJ SOLN
1.0000 mg | Freq: Once | INTRAMUSCULAR | Status: AC
  Administered 2016-06-04: 1 mg via INTRAVENOUS
  Filled 2016-06-04: qty 1

## 2016-06-04 MED ORDER — ONDANSETRON HCL 4 MG/2ML IJ SOLN
4.0000 mg | Freq: Once | INTRAMUSCULAR | Status: AC
  Administered 2016-06-04: 4 mg via INTRAVENOUS
  Filled 2016-06-04: qty 2

## 2016-06-04 NOTE — ED Notes (Signed)
Pt transported to CT ?

## 2016-06-04 NOTE — ED Triage Notes (Signed)
Pt reports that she had her gallbladder out on the 2nd of this month. Today she began with severe abdominal pain with N/V. Pt unable to tolerate anything by mouth.

## 2016-06-04 NOTE — ED Provider Notes (Signed)
[facility] DEPT Provider Note  CSN: [id number] Arrival Date & Time: 06/04/16 @ 57  History    Chief Complaint Chief Complaint  Patient presents with  . Abdominal Pain  . Emesis    HPI Jasmine Bradford is a 60 y.o. female.  Patient resents to the emergency department for assessment of significant right upper quadrant and epigastric pain with worsening nausea and onset of emesis upon waking this morning. Patient endorses she has had no blood per emesis and has had no diarrhea or bloody bowel movements. No urinary symptoms patient endorses that her pain in her abdomen is 1010 and diffuse and worse with movement better with rest. Denies chest pain or shortness of breath. Patient had recent gallbladder surgery and removal on October 2 and states that when she left the hospital she was mildly nauseated however did not have pain at that time. Patient had a diagnosis of cholelithiasis with choledocholithiasis. Patient is passing flatus and has had bowel movements today.  Past Medical & Surgical History    Past Medical History:  Diagnosis Date  . Cholelithiasis with choledocholithiasis   . Tobacco abuse   . Varicose vein of leg    no problems now  . Wears glasses    Patient Active Problem List   Diagnosis Date Noted  . Biliary obstruction 06/05/2016  . Elevated LFTs   . Abdominal pain 03/03/2016  . Hypokalemia 03/03/2016  . Chest pressure 03/03/2016  . LFTs abnormal 03/03/2016  . Tobacco abuse    Past Surgical History:  Procedure Laterality Date  . CESAREAN SECTION    . CHOLECYSTECTOMY N/A 05/23/2016   Procedure: LAPAROSCOPIC CHOLECYSTECTOMY WITH INTRAOPERATIVE CHOLANGIOGRAM;  Surgeon: Jackolyn Confer, MD;  Location: [facility];  Service: General;  Laterality: N/A;  . ERCP N/A 03/09/2016   Procedure: ENDOSCOPIC RETROGRADE CHOLANGIOPANCREATOGRAPHY (ERCP);  Surgeon: Clarene Essex, MD;  Location: [facility];  Service: Endoscopy;  Laterality: N/A;  . laser vein surgery     last year  .  TUBAL LIGATION    . VARICOSE VEIN SURGERY Left     Family & Social History    Family History  Problem Relation Age of Onset  . Memory loss Mother    Social History  Substance Use Topics  . Smoking status: Former Smoker    Packs/day: 0.50    Years: 35.00    Types: Cigarettes  . Smokeless tobacco: Never Used     Comment: No smoing since 02/2016- "wearing Nicotine patch"  . Alcohol use No    Home Medications    Prior to Admission medications   Medication Sig Start Date End Date Taking? Authorizing Provider  acetaminophen (TYLENOL) 500 MG tablet Take 500-1,000 mg by mouth every 6 (six) hours as needed (for pain).   Yes Historical Provider, MD  ondansetron (ZOFRAN) 4 MG tablet Take 1 tablet (4 mg total) by mouth every 4 (four) hours as needed for nausea. 05/23/16  Yes Jackolyn Confer, MD  oxyCODONE (OXY IR/ROXICODONE) 5 MG immediate release tablet Take 1-2 tablets (5-10 mg total) by mouth every 4 (four) hours as needed for moderate pain, severe pain or breakthrough pain. Patient not taking: Reported on 06/04/2016 05/23/16   Jackolyn Confer, MD    Allergies    Oxycodone  I reviewed & agree with nursing's documentation on the patient's past medical, surgical, social & family histories as well as their allergies.  Review of Systems  Complete ROS obtained, and is negative except as stated in HPI.   Physical Exam  Updated Vital  Signs BP (!) 90/50 (BP Location: Left Arm)   Pulse (!) 50   Temp 98.8 F (37.1 C) (Oral)   Resp 18   Ht 5\' 2"  (1.575 m)   Wt 53.6 kg   SpO2 97%   BMI 21.62 kg/m  I have reviewed the triage vital signs and the nursing notes. Physical Exam CONST: Patient oriented to person, place and time, in mild to moderate distress.  EYES: PERRLA. EOMI. Conjunctiva w/o d/c. Lids AT w/o swelling.  ENMT: External Nares & Ears AT w/o swelling. Oropharynx patent. MM moist.  NECK: ROM full w/o rigidity. Trachea midline. JVD absent.  CVS: +S1/S2 w/o obvious murmur.  Lower extremities w/o pitting edema.  RESP: Respiratory effort unlabored w/o retractions & accessory muscle use. BS clear bilaterally.  GI: Soft and ND. Hypoactive BS x 4. TTP present all quadrants w/o significant focus in RUQ. Hernia absent. Guarding & Rebound present.  BACK: CVA TTP absent bilaterally.  SKIN: Skin warm & dry. Turgor good. No rash.  PSYCH: Alert. Oriented. Affect and mood appropriate.  NEURO: CN II-XII grossly intact. Motor exam symmetric w/ upper & lower extremities 5/5 bilaterally. Sensation grossly intact.  MSK: Joints located & stable, w/o obvious dislocation & obvious deformity or crepitus absent w/ Cap refill < 2 sec. Peripheral pulses 2+ & equal in all extremities.   ED Treatments & Results   Labs (only abnormal results are displayed) Labs Reviewed  COMPREHENSIVE METABOLIC PANEL - Abnormal; Notable for the following:       Result Value   Glucose, Bld 151 (*)    AST 636 (*)    ALT 384 (*)    Alkaline Phosphatase 240 (*)    Total Bilirubin 1.5 (*)    All other components within normal limits  URINALYSIS, ROUTINE W REFLEX MICROSCOPIC (NOT AT [facility]) - Abnormal; Notable for the following:    Bilirubin Urine SMALL (*)    Nitrite POSITIVE (*)    Leukocytes, UA SMALL (*)    All other components within normal limits  URINE MICROSCOPIC-ADD ON - Abnormal; Notable for the following:    Squamous Epithelial / LPF 0-5 (*)    Bacteria, UA FEW (*)    All other components within normal limits  COMPREHENSIVE METABOLIC PANEL - Abnormal; Notable for the following:    Glucose, Bld 145 (*)    Total Protein 6.1 (*)    Albumin 3.2 (*)    AST 1,108 (*)    ALT 857 (*)    Alkaline Phosphatase 246 (*)    Total Bilirubin 2.5 (*)    All other components within normal limits  URINE CULTURE  CULTURE, BLOOD (ROUTINE X 2)  CULTURE, BLOOD (ROUTINE X 2)  LIPASE, BLOOD  CBC  MAGNESIUM  PHOSPHORUS  CBC  I-STAT CG4 LACTIC ACID, ED    EKG    EKG Interpretation  Date/Time:      Ventricular Rate:    PR Interval:    QRS Duration:   QT Interval:    QTC Calculation:   R Axis:     Text Interpretation:         Radiology Ct Abdomen Pelvis W Contrast  Result Date: 06/05/2016 CLINICAL DATA:  60 y/o F; epigastric abdominal pain with nausea onset today. History of cholecystectomy 05/23/2016. EXAM: CT ABDOMEN AND PELVIS WITH CONTRAST TECHNIQUE: Multidetector CT imaging of the abdomen and pelvis was performed using the standard protocol following bolus administration of intravenous contrast. CONTRAST:  121mL ISOVUE-300 IOPAMIDOL (ISOVUE-300) INJECTION 61% COMPARISON:  03/04/2016 MRI abdomen. FINDINGS: Lower chest: No acute abnormality. 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 Heterogeneity of enhancement within the liver predominantly in the right hepatic lobe around bile ducts. Status post cholecystectomy. Rim enhancing fluid collection within the gallbladder fossa measuring 20 x 7 x 48 mm (AP x ML x CC), series 5, image 37 and series 6, image 40. Pancreas: Unremarkable. No pancreatic ductal dilatation or surrounding inflammatory changes. Spleen: Normal in size without focal abnormality. Adrenals/Urinary Tract: Adrenal glands are unremarkable. Kidneys are normal, without renal calculi, focal lesion, or hydronephrosis. Bladder is unremarkable. Stomach/Bowel: Small hiatal hernia. Vascular/Lymphatic: Aortic atherosclerosis. No enlarged abdominal or pelvic lymph nodes. Reproductive: Uterus and bilateral adnexa are unremarkable. Bilateral tubal ligation. Other: No abdominal wall hernia or abnormality. No abdominopelvic ascites. Musculoskeletal: No acute or significant osseous findings. Mild lower lumbar levo curvature. Degenerative changes of lower lumbar spine greatest at L5-S1 and lower lumbar facet arthrosis. IMPRESSION: 1. Rim enhancing fluid collection within the gallbladder fossa may be  postoperative. Bile leak and infection are also possible. 2. Heterogeneous liver enhancement concentrated around bile ducts may represent infectious/inflammatory hepatitis or cholangitis. 3. 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 4. Small hiatal hernia. 5. Aortic atherosclerosis. Electronically Signed   By: Kristine Garbe M.D.   On: 06/05/2016 00:48    Pertinent labs & imaging results that were available during my care of the patient were independently visualized by me and considered in my medical decision making, please see chart for details. Formal interpretation provided by Radiology.  Procedures (including critical care time) Procedures  Medications Ordered in ED Medications  dextrose 5 % and 0.45 % NaCl with KCl 20 mEq/L infusion ( Intravenous New Bag/Given 06/05/16 1436)  acetaminophen (TYLENOL) tablet 650 mg (not administered)    Or  acetaminophen (TYLENOL) suppository 650 mg (not administered)  morphine 2 MG/ML injection 1-2 mg (not administered)  diphenhydrAMINE (BENADRYL) 12.5 MG/5ML elixir 12.5 mg (not administered)    Or  diphenhydrAMINE (BENADRYL) injection 12.5 mg (not administered)  ondansetron (ZOFRAN-ODT) disintegrating tablet 4 mg ( Oral See Alternative 06/05/16 0840)    Or  ondansetron (ZOFRAN) injection 4 mg (4 mg Intravenous Given 06/05/16 0840)  pantoprazole (PROTONIX) EC tablet 40 mg (40 mg Oral Not Given 06/05/16 1130)  piperacillin-tazobactam (ZOSYN) IVPB 3.375 g (3.375 g Intravenous New Bag/Given 06/05/16 1438)  promethazine (PHENERGAN) injection 6.25 mg (not administered)  sodium chloride 0.9 % bolus 1,000 mL (0 mLs Intravenous Stopped 06/04/16 2245)  HYDROmorphone (DILAUDID) injection 1 mg (1 mg Intravenous Given 06/04/16 2140)  ondansetron (ZOFRAN) injection 4 mg (4 mg Intravenous Given 06/04/16 2138)  iopamidol (ISOVUE-300) 61 % injection (100 mLs  Contrast Given  06/04/16 2355)    Initial Impression & Plan / ED Course & Results / Final Disposition   Initial Impression & Plan Patient presents emergency department with 1 day of significant abdominal pain nausea and emesis. Concern at this time involves postsurgical infection such as abscess or surrounding infection of gallbladder removal versus retained stone and progression to cholangitis. Patient does not have jaundice nor altered mental status and is not febrile nor does she meet vital signs consistent or concerning for sepsis. Patient does have significant abdominal tenderness per all quadrants with focus in right upper quadrant along with epigastrium. Patient does not have previous pancreatitis episodes and does not drink alcohol. Lipase within normal limits and  believe that pancreatitis is unlikely at this time.  ED Course & Results The patient's laboratory work reveals concern for elevated LFTs and consideration at this time involves duct injury versus cholangitis however patient is afebrile and has no vital signs concerning for cholangitis and patient also does not have jaundice or altered mental status. Due to patient's absence of leukocytosis and other findings consistent with infectious etiology at this time antibiotics will be held until CT abdomen pelvis with contrast has been read and reviewed by myself and radiology. Patient is resting comfortably upon bed at this time following antiemetics and intravenous analgesia.  Final Disposition Per my clinical impression at the end of my shift, patient still requires CT of abdomen and pelvis and reassessment for necessity of surgery consultation, therefore care assumed by Dr. Stark Jock. Please refer to their documentation regarding continued ED course along with final impression and disposition.  Final Clinical Impression & ED Diagnoses  No diagnosis found. Patient care discussed with the attending physician, Dr. Darl Householder, who oversaw their evaluation & treatment &  voiced agreement.  Note: This document was prepared using Dragon voice recognition software and may include unintentional dictation errors.  House Officer: Voncille Lo, MD, Emergency Medicine Resident.   Voncille Lo, MD 06/05/16 [place], MD 06/05/16 Broussard Yao, MD 06/06/16 2130

## 2016-06-05 ENCOUNTER — Encounter (HOSPITAL_COMMUNITY): Payer: Self-pay | Admitting: *Deleted

## 2016-06-05 DIAGNOSIS — K831 Obstruction of bile duct: Secondary | ICD-10-CM | POA: Diagnosis present

## 2016-06-05 DIAGNOSIS — K8070 Calculus of gallbladder and bile duct without cholecystitis without obstruction: Secondary | ICD-10-CM | POA: Diagnosis present

## 2016-06-05 DIAGNOSIS — Z87891 Personal history of nicotine dependence: Secondary | ICD-10-CM | POA: Diagnosis not present

## 2016-06-05 DIAGNOSIS — R1011 Right upper quadrant pain: Secondary | ICD-10-CM | POA: Diagnosis present

## 2016-06-05 DIAGNOSIS — Z23 Encounter for immunization: Secondary | ICD-10-CM | POA: Diagnosis not present

## 2016-06-05 DIAGNOSIS — K828 Other specified diseases of gallbladder: Secondary | ICD-10-CM | POA: Diagnosis present

## 2016-06-05 DIAGNOSIS — Z9049 Acquired absence of other specified parts of digestive tract: Secondary | ICD-10-CM | POA: Diagnosis not present

## 2016-06-05 DIAGNOSIS — Z885 Allergy status to narcotic agent status: Secondary | ICD-10-CM | POA: Diagnosis not present

## 2016-06-05 DIAGNOSIS — Z6821 Body mass index (BMI) 21.0-21.9, adult: Secondary | ICD-10-CM | POA: Diagnosis not present

## 2016-06-05 DIAGNOSIS — Z79899 Other long term (current) drug therapy: Secondary | ICD-10-CM | POA: Diagnosis not present

## 2016-06-05 LAB — COMPREHENSIVE METABOLIC PANEL
ALT: 857 U/L — AB (ref 14–54)
ANION GAP: 9 (ref 5–15)
AST: 1108 U/L — ABNORMAL HIGH (ref 15–41)
Albumin: 3.2 g/dL — ABNORMAL LOW (ref 3.5–5.0)
Alkaline Phosphatase: 246 U/L — ABNORMAL HIGH (ref 38–126)
BUN: 11 mg/dL (ref 6–20)
CHLORIDE: 106 mmol/L (ref 101–111)
CO2: 28 mmol/L (ref 22–32)
CREATININE: 0.9 mg/dL (ref 0.44–1.00)
Calcium: 9 mg/dL (ref 8.9–10.3)
GFR calc non Af Amer: >60 mL/min (ref >60)
Glucose, Bld: 145 mg/dL — ABNORMAL HIGH (ref 65–99)
Potassium: 4.5 mmol/L (ref 3.5–5.1)
SODIUM: 143 mmol/L (ref 135–145)
Total Bilirubin: 2.5 mg/dL — ABNORMAL HIGH (ref 0.3–1.2)
Total Protein: 6.1 g/dL — ABNORMAL LOW (ref 6.5–8.1)

## 2016-06-05 LAB — CBC
HEMATOCRIT: 38 % (ref 36.0–46.0)
HEMOGLOBIN: 12.7 g/dL (ref 12.0–15.0)
MCH: 28.7 pg (ref 26.0–34.0)
MCHC: 33.4 g/dL (ref 30.0–36.0)
MCV: 85.8 fL (ref 78.0–100.0)
Platelets: 204 10*3/uL (ref 150–400)
RBC: 4.43 MIL/uL (ref 3.87–5.11)
RDW: 12.8 % (ref 11.5–15.5)
WBC: 9.9 10*3/uL (ref 4.0–10.5)

## 2016-06-05 LAB — MAGNESIUM: Magnesium: 2.3 mg/dL (ref 1.7–2.4)

## 2016-06-05 LAB — PHOSPHORUS: PHOSPHORUS: 4.5 mg/dL (ref 2.5–4.6)

## 2016-06-05 MED ORDER — DIPHENHYDRAMINE HCL 12.5 MG/5ML PO ELIX: 12.5000 mg | ORAL_SOLUTION | Freq: Four times a day (QID) | ORAL | Status: DC | PRN

## 2016-06-05 MED ORDER — INFLUENZA VAC SPLIT QUAD 0.5 ML IM SUSY
0.5000 mL | PREFILLED_SYRINGE | INTRAMUSCULAR | Status: AC
  Administered 2016-06-07: 0.5 mL via INTRAMUSCULAR

## 2016-06-05 MED ORDER — ONDANSETRON 4 MG PO TBDP: 4.0000 mg | ORAL_TABLET | Freq: Four times a day (QID) | ORAL | Status: DC | PRN

## 2016-06-05 MED ORDER — ONDANSETRON HCL 4 MG/2ML IJ SOLN
4.0000 mg | Freq: Four times a day (QID) | INTRAMUSCULAR | Status: DC | PRN
  Administered 2016-06-05 – 2016-06-06 (×4): 4 mg via INTRAVENOUS
  Filled 2016-06-05 (×3): qty 2

## 2016-06-05 MED ORDER — PIPERACILLIN-TAZOBACTAM 3.375 G IVPB
3.3750 g | Freq: Three times a day (TID) | INTRAVENOUS | Status: DC
  Administered 2016-06-05 – 2016-06-07 (×7): 3.375 g via INTRAVENOUS
  Filled 2016-06-05 (×9): qty 50

## 2016-06-05 MED ORDER — KCL IN DEXTROSE-NACL 20-5-0.45 MEQ/L-%-% IV SOLN
INTRAVENOUS | Status: DC
  Administered 2016-06-05 – 2016-06-07 (×5): via INTRAVENOUS
  Filled 2016-06-05 (×5): qty 1000

## 2016-06-05 MED ORDER — DIPHENHYDRAMINE HCL 50 MG/ML IJ SOLN
12.5000 mg | Freq: Four times a day (QID) | INTRAMUSCULAR | Status: DC | PRN
  Administered 2016-06-06: 12.5 mg via INTRAVENOUS
  Filled 2016-06-05: qty 1

## 2016-06-05 MED ORDER — PROMETHAZINE HCL 25 MG/ML IJ SOLN
6.2500 mg | Freq: Four times a day (QID) | INTRAMUSCULAR | Status: DC | PRN
  Filled 2016-06-05: qty 1

## 2016-06-05 MED ORDER — PANTOPRAZOLE SODIUM 40 MG PO TBEC
40.0000 mg | DELAYED_RELEASE_TABLET | Freq: Every day | ORAL | Status: DC
  Administered 2016-06-06 – 2016-06-07 (×2): 40 mg via ORAL
  Filled 2016-06-05 (×3): qty 1

## 2016-06-05 MED ORDER — ACETAMINOPHEN 650 MG RE SUPP: 650.0000 mg | Freq: Four times a day (QID) | RECTAL | Status: DC | PRN

## 2016-06-05 MED ORDER — ACETAMINOPHEN 325 MG PO TABS
650.0000 mg | ORAL_TABLET | Freq: Four times a day (QID) | ORAL | Status: DC | PRN
  Administered 2016-06-05: 650 mg via ORAL
  Filled 2016-06-05: qty 2

## 2016-06-05 MED ORDER — MORPHINE SULFATE (PF) 2 MG/ML IV SOLN: 1.0000 mg | INTRAVENOUS | Status: DC | PRN

## 2016-06-05 MED ORDER — PROMETHAZINE HCL 25 MG/ML IJ SOLN
12.5000 mg | Freq: Four times a day (QID) | INTRAMUSCULAR | Status: DC | PRN
  Administered 2016-06-05: 12.5 mg via INTRAVENOUS
  Filled 2016-06-05: qty 1

## 2016-06-05 NOTE — Consult Note (Signed)
Referring Provider: Dr. Greer Pickerel  Primary Care Physician:  No PCP Per Patient Primary Gastroenterologist:  Dr. Clarene Essex Reason for Consultation:  Elevated liver chemistries  HPI: Jasmine Bradford is a 60 y.o. female admitted through the emergency room last night, following an 8 hour prodrome of the abrupt onset of epigastric pain, and protracted vomiting. Upon arrival at the emergency room, she was found to have transaminases in the 600 range, which have increased to over 1000 overnight, with a bilirubin of 2.5. Lipase normal, white count normal.  She has not had any fevers or chills. Her urine did turn dark after coming to the emergency room. She has not had any vomiting for approximately 12 hours. The emesis was nonbloody. No associated diarrhea.  Patient is 2 weeks status post laparoscopic cholecystectomy with intraoperative cholangiogram that showed some questionable "nonobstructing sludge" in the CBD, with flow of contrast into the duodenum, and some diffuse biliary ductal dilatation. The patient's preoperative liver chemistries were completely normal.  The patient is almost 3 months status post ERCP, sphincterotomy, and stone extraction of multiple small stones by Dr. Elta Guadeloupe may got. At that time, a 15 mm balloon could readily be pulled through the sphincterotomy. That was an outpatient procedure, done because of abdominal pain, gallstones, and elevated liver chemistries on a brief hospitalization a few days earlier.  The patient had a CT in the emergency room last night which shows a roughly 5 cm fluid collection in the gallbladder fossa region, as well as persistent biliary ductal dilatation.  The patient denies significant abdominal pain following her surgery, and apparently only took pain medicine for one day. She was making a nice convalescence until yesterday's pain abruptly occurred.   Past Medical History:  Diagnosis Date  . Cholelithiasis with choledocholithiasis   . Tobacco  abuse   . Varicose vein of leg    no problems now  . Wears glasses     Past Surgical History:  Procedure Laterality Date  . CESAREAN SECTION    . CHOLECYSTECTOMY N/A 05/23/2016   Procedure: LAPAROSCOPIC CHOLECYSTECTOMY WITH INTRAOPERATIVE CHOLANGIOGRAM;  Surgeon: Jackolyn Confer, MD;  Location: [place];  Service: General;  Laterality: N/A;  . ERCP N/A 03/09/2016   Procedure: ENDOSCOPIC RETROGRADE CHOLANGIOPANCREATOGRAPHY (ERCP);  Surgeon: Clarene Essex, MD;  Location: [facility];  Service: Endoscopy;  Laterality: N/A;  . laser vein surgery     last year  . TUBAL LIGATION    . VARICOSE VEIN SURGERY Left     Prior to Admission medications   Medication Sig Start Date End Date Taking? Authorizing Provider  acetaminophen (TYLENOL) 500 MG tablet Take 500-1,000 mg by mouth every 6 (six) hours as needed (for pain).   Yes Historical Provider, MD  ondansetron (ZOFRAN) 4 MG tablet Take 1 tablet (4 mg total) by mouth every 4 (four) hours as needed for nausea. 05/23/16  Yes Jackolyn Confer, MD  oxyCODONE (OXY IR/ROXICODONE) 5 MG immediate release tablet Take 1-2 tablets (5-10 mg total) by mouth every 4 (four) hours as needed for moderate pain, severe pain or breakthrough pain. Patient not taking: Reported on 06/04/2016 05/23/16   Jackolyn Confer, MD    Current Facility-Administered Medications  Medication Dose Route Frequency Provider Last Rate Last Dose  . acetaminophen (TYLENOL) tablet 650 mg  650 mg Oral Q6H PRN Greer Pickerel, MD       Or  . acetaminophen (TYLENOL) suppository 650 mg  650 mg Rectal Q6H PRN Greer Pickerel, MD      . dextrose  5 % and 0.45 % NaCl with KCl 20 mEq/L infusion   Intravenous Continuous Greer Pickerel, MD 100 mL/hr at 06/05/16 0435    . diphenhydrAMINE (BENADRYL) 12.5 MG/5ML elixir 12.5 mg  12.5 mg Oral Q6H PRN Greer Pickerel, MD       Or  . diphenhydrAMINE (BENADRYL) injection 12.5 mg  12.5 mg Intravenous Q6H PRN Greer Pickerel, MD      . morphine 2 MG/ML injection 1-2 mg  1-2 mg  Intravenous Q1H PRN Greer Pickerel, MD      . ondansetron (ZOFRAN-ODT) disintegrating tablet 4 mg  4 mg Oral Q6H PRN Greer Pickerel, MD       Or  . ondansetron [facility]) injection 4 mg  4 mg Intravenous Q6H PRN Greer Pickerel, MD   4 mg at 06/05/16 0840  . pantoprazole (PROTONIX) EC tablet 40 mg  40 mg Oral Daily Greer Pickerel, MD      . piperacillin-tazobactam (ZOSYN) IVPB 3.375 g  3.375 g Intravenous Q8H Erenest Blank, RPH 12.5 mL/hr at 06/05/16 0235 3.375 g at 06/05/16 0235  . promethazine (PHENERGAN) injection 6.25 mg  6.25 mg Intravenous Q6H PRN [place], [facility]        Allergies as of 06/04/2016 - Review Complete 06/04/2016  Allergen Reaction Noted  . Oxycodone Nausea And Vomiting and Other (See Comments) 06/04/2016    Family History  Problem Relation Age of Onset  . Memory loss Mother     Social History   Social History  . Marital status: Married    Spouse name: N/A  . Number of children: N/A  . Years of education: N/A   Occupational History  . Not on file.   Social History Main Topics  . Smoking status: Former Smoker    Packs/day: 0.50    Years: 35.00    Types: Cigarettes  . Smokeless tobacco: Never Used     Comment: No smoing since 02/2016- "wearing Nicotine patch"  . Alcohol use No  . Drug use: No  . Sexual activity: Not on file   Other Topics Concern  . Not on file   Social History Narrative  . No narrative on file    Review of Systems: See history of present illness.  Physical Exam: Vital signs in last 24 hours: Temp:  [98 F (36.7 C)-98.8 F (37.1 C)] 98.8 F (37.1 C) (10/15 1253) Pulse Rate:  [50-87] 50 (10/15 1253) Resp:  [16-18] 18 (10/15 0506) BP: (89-137)/(50-79) 90/50 (10/15 1253) SpO2:  [92 %-100 %] 97 % (10/15 1253) Weight:  [53.6 kg (118 lb 3.2 oz)-54.4 kg (120 lb)] 53.6 kg (118 lb 3.2 oz) (10/15 0308) Last BM Date: 06/04/16 General:   Alert,  Well-developed, healthy-appearing, well-nourished, pleasant and cooperative in NAD Head:   Normocephalic and atraumatic. Eyes:  Sclera clear, no icterus.   Conjunctiva pink. Lungs:  Clear throughout to auscultation.   No wheezes, crackles, or rhonchi. No evident respiratory distress. Heart:   Regular rate and rhythm; no murmurs, clicks, rubs,  or gallops. Abdomen:  Soft, essentially nontender, no overt distention. No masses, hepatosplenomegaly or ventral hernias noted.  Msk:   Symmetrical without gross deformities. Neurologic:  Alert and coherent;  grossly normal neurologically. Skin:  Intact without significant lesions or rashes. Psych:   Alert and cooperative. Normal mood and affect.  Intake/Output from previous day: 10/14 0701 - 10/15 0700 In: 1000 [IV Piggyback:1000] Out: -  Intake/Output this shift: No intake/output data recorded.  Lab Results:  Recent Labs  06/04/16  1800 06/05/16 0455  WBC 9.6 9.9  HGB 14.3 12.7  HCT 42.1 38.0  PLT 229 204   BMET  Recent Labs  06/04/16 1800 06/05/16 0455  NA 137 143  K 3.8 4.5  CL 101 106  CO2 25 28  GLUCOSE 151* 145*  BUN 13 11  CREATININE 0.94 0.90  CALCIUM 9.1 9.0   LFT  Recent Labs  06/05/16 0455  PROT 6.1*  ALBUMIN 3.2*  AST 1,108*  ALT 857*  ALKPHOS 246*  BILITOT 2.5*   PT/INR No results for input(s): LABPROT, INR in the last 72 hours.  Studies/Results: Ct Abdomen Pelvis W Contrast  Result Date: 06/05/2016 CLINICAL DATA:  60 y/o F; epigastric abdominal pain with nausea onset today. History of cholecystectomy 05/23/2016. EXAM: CT ABDOMEN AND PELVIS WITH CONTRAST TECHNIQUE: Multidetector CT imaging of the abdomen and pelvis was performed using the standard protocol following bolus administration of intravenous contrast. CONTRAST:  137mL ISOVUE-300 IOPAMIDOL (ISOVUE-300) INJECTION 61% COMPARISON:  03/04/2016 MRI abdomen. FINDINGS: Lower chest: No acute abnormality. 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  Heterogeneity of enhancement within the liver predominantly in the right hepatic lobe around bile ducts. Status post cholecystectomy. Rim enhancing fluid collection within the gallbladder fossa measuring 20 x 7 x 48 mm (AP x ML x CC), series 5, image 37 and series 6, image 40. Pancreas: Unremarkable. No pancreatic ductal dilatation or surrounding inflammatory changes. Spleen: Normal in size without focal abnormality. Adrenals/Urinary Tract: Adrenal glands are unremarkable. Kidneys are normal, without renal calculi, focal lesion, or hydronephrosis. Bladder is unremarkable. Stomach/Bowel: Small hiatal hernia. Vascular/Lymphatic: Aortic atherosclerosis. No enlarged abdominal or pelvic lymph nodes. Reproductive: Uterus and bilateral adnexa are unremarkable. Bilateral tubal ligation. Other: No abdominal wall hernia or abnormality. No abdominopelvic ascites. Musculoskeletal: No acute or significant osseous findings. Mild lower lumbar levo curvature. Degenerative changes of lower lumbar spine greatest at L5-S1 and lower lumbar facet arthrosis. IMPRESSION: 1. Rim enhancing fluid collection within the gallbladder fossa may be postoperative. Bile leak and infection are also possible. 2. Heterogeneous liver enhancement concentrated around bile ducts may represent infectious/inflammatory hepatitis or cholangitis. 3. 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 4. Small hiatal hernia. 5. Aortic atherosclerosis. Electronically Signed   By: Kristine Garbe M.D.   On: 06/05/2016 00:48    Impression: 1. Epigastric pain with abrupt, severe elevation of liver chemistries 2. History of common duct stone status post ERCP 3 months ago, and status post lap Chole with essentially negative IOC 2 weeks ago. 3. Persistent dilatation of the biliary system  Plan: ERCP to check for common duct stones or other cause for biliary obstruction, and to  check for a bile leak. Purpose and risks of ERCP, possible extension of her sphincterotomy, stone extraction, and stent placement reviewed with the patient and her husband. Unfortunately, it appears that anesthesia coverage for this procedure will not be available until this evening, so the procedure will most likely be deferred until tomorrow.    LOS: 0 days   Azlan Hanway V  06/05/2016, 2:31 PM   Pager [phone] If no answer or after 5 PM call [phone]

## 2016-06-05 NOTE — Progress Notes (Signed)
Subjective: Pt with some nausea and epigastric abd pain  Objective: Vital signs in last 24 hours: Temp:  [98 F (36.7 C)-98.2 F (36.8 C)] 98.1 F (36.7 C) (10/15 0506) Pulse Rate:  [50-87] 62 (10/15 0506) Resp:  [16-18] 18 (10/15 0506) BP: (89-137)/(54-79) 89/57 (10/15 0506) SpO2:  [92 %-100 %] 97 % (10/15 0506) Weight:  [53.6 kg (118 lb 3.2 oz)-54.4 kg (120 lb)] 53.6 kg (118 lb 3.2 oz) (10/15 0308) Last BM Date: 06/04/16  Intake/Output from previous day: 10/14 0701 - 10/15 0700 In: 1000 [IV Piggyback:1000] Out: -  Intake/Output this shift: No intake/output data recorded.  General appearance: alert and cooperative GI: soft, epigastric abd pain  Lab Results:   Recent Labs  06/04/16 1800 06/05/16 0455  WBC 9.6 9.9  HGB 14.3 12.7  HCT 42.1 38.0  PLT 229 204   BMET  Recent Labs  06/04/16 1800 06/05/16 0455  NA 137 143  K 3.8 4.5  CL 101 106  CO2 25 28  GLUCOSE 151* 145*  BUN 13 11  CREATININE 0.94 0.90  CALCIUM 9.1 9.0   PT/INR No results for input(s): LABPROT, INR in the last 72 hours. ABG No results for input(s): PHART, HCO3 in the last 72 hours.  Invalid input(s): PCO2, PO2  Studies/Results: Ct Abdomen Pelvis W Contrast  Result Date: 06/05/2016 CLINICAL DATA:  60 y/o F; epigastric abdominal pain with nausea onset today. History of cholecystectomy 05/23/2016. EXAM: CT ABDOMEN AND PELVIS WITH CONTRAST TECHNIQUE: Multidetector CT imaging of the abdomen and pelvis was performed using the standard protocol following bolus administration of intravenous contrast. CONTRAST:  194mL ISOVUE-300 IOPAMIDOL (ISOVUE-300) INJECTION 61% COMPARISON:  03/04/2016 MRI abdomen. FINDINGS: Lower chest: No acute abnormality. 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 Heterogeneity of enhancement within the liver predominantly in the right hepatic lobe around bile ducts. Status post  cholecystectomy. Rim enhancing fluid collection within the gallbladder fossa measuring 20 x 7 x 48 mm (AP x ML x CC), series 5, image 37 and series 6, image 40. Pancreas: Unremarkable. No pancreatic ductal dilatation or surrounding inflammatory changes. Spleen: Normal in size without focal abnormality. Adrenals/Urinary Tract: Adrenal glands are unremarkable. Kidneys are normal, without renal calculi, focal lesion, or hydronephrosis. Bladder is unremarkable. Stomach/Bowel: Small hiatal hernia. Vascular/Lymphatic: Aortic atherosclerosis. No enlarged abdominal or pelvic lymph nodes. Reproductive: Uterus and bilateral adnexa are unremarkable. Bilateral tubal ligation. Other: No abdominal wall hernia or abnormality. No abdominopelvic ascites. Musculoskeletal: No acute or significant osseous findings. Mild lower lumbar levo curvature. Degenerative changes of lower lumbar spine greatest at L5-S1 and lower lumbar facet arthrosis. IMPRESSION: 1. Rim enhancing fluid collection within the gallbladder fossa may be postoperative. Bile leak and infection are also possible. 2. Heterogeneous liver enhancement concentrated around bile ducts may represent infectious/inflammatory hepatitis or cholangitis. 3. 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 4. Small hiatal hernia. 5. Aortic atherosclerosis. Electronically Signed   By: Kristine Garbe M.D.   On: 06/05/2016 00:48    Anti-infectives: Anti-infectives    Start     Dose/Rate Route Frequency Ordered Stop   06/05/16 0200  piperacillin-tazobactam (ZOSYN) IVPB 3.375 g     3.375 g 12.5 mL/hr over 240 Minutes Intravenous Every 8 hours 06/05/16 0152        Assessment/Plan: Status post laparoscopic cholecystectomy with cholangiogram October 2 by Dr. Zella Richer Abdominal pain with nausea and vomiting  Elevated liver function test Biliary ductal dilatation on imaging Small  gallbladder fossa fluid collection   Pt with increasing LFTs overnight. PT con't with epigastric abdominal pain and some nausea Will con't NPO for now. Pt would likely benefit from ERCP.  Dr. Cristina Gong on board  LOS: 0 days    Rosario Jacks., [facility] 06/05/2016

## 2016-06-05 NOTE — ED Notes (Signed)
Dr Stark Jock gave verbal order to cancel 2nd lactate draw, 1st lactate WNL.

## 2016-06-05 NOTE — ED Provider Notes (Signed)
Care assumed from Dr. Darl Householder at shift change. CT shows a fluid collection in the gallbladder fossa. I have discussed this with Dr. Redmond Pulling from general surgery who will evaluate and determine the final disposition.   Veryl Speak, MD 06/05/16 2225

## 2016-06-05 NOTE — H&P (Signed)
Jasmine Bradford is an 60 y.o. female.   Chief Complaint: Abdominal pain HPI: 60 year old female status post laparoscopic cholecystectomy with cholangiogram by Dr. Zella Richer on October 2 developed abdominal pain with nausea and vomiting today after eating breakfast and came to the emergency room for evaluation because she could not tolerate anything by mouth. She states that she felt just like she did back in July. She states that she was recovering great from surgery getting stronger and better each and every day and had no abdominal pain until today. She had presented in July to the hospital with epigastric pain and was found to have choledocholithiasis and elevated LFTs. She underwent outpatient ERCP with sphincterotomy by Dr. Watt Climes on July 19. Multiple common bile duct stones were extracted. She was referred to [facility] surgery and saw Dr. Zella Richer. He set her up for surgery and she underwent uneventful laparoscopic cholecystectomy with cholangiogram on October 2. The gallbladder was chronically inflamed. 2 PDS Endoloops were placed around the cystic duct stump. Intraoperative cholangiogram demonstrated emptying of contrast into the duodenum but the final read was suggestive of a possible nonobstructing sludge in the distal common bile duct. Her LFTs were normal preoperatively.  She was found to have elevated LFTs tonight in the emergency room with a normal CBC. A CT scan demonstrated a small fluid collection in the gallbladder fossa with severe intrahepatic and common hepatic ductal dilatation  She has stopped smoking  Past Medical History:  Diagnosis Date  . Cholelithiasis with choledocholithiasis   . Tobacco abuse   . Varicose vein of leg    no problems now  . Wears glasses     Past Surgical History:  Procedure Laterality Date  . CESAREAN SECTION    . CHOLECYSTECTOMY N/A 05/23/2016   Procedure: LAPAROSCOPIC CHOLECYSTECTOMY WITH INTRAOPERATIVE CHOLANGIOGRAM;  Surgeon: Jackolyn Confer, MD;  Location: [place];  Service: General;  Laterality: N/A;  . ERCP N/A 03/09/2016   Procedure: ENDOSCOPIC RETROGRADE CHOLANGIOPANCREATOGRAPHY (ERCP);  Surgeon: Clarene Essex, MD;  Location: [facility];  Service: Endoscopy;  Laterality: N/A;  . laser vein surgery     last year  . TUBAL LIGATION    . VARICOSE VEIN SURGERY Left     Family History  Problem Relation Age of Onset  . Memory loss Mother    Social History:  reports that she has quit smoking. Her smoking use included Cigarettes. She has a 17.50 pack-year smoking history. She has never used smokeless tobacco. She reports that she does not drink alcohol or use drugs.  Allergies:  Allergies  Allergen Reactions  . Oxycodone Nausea And Vomiting and Other (See Comments)    Also made patient lightheaded and lethargic     (Not in a hospital admission)  Results for orders placed or performed during the hospital encounter of 06/04/16 (from the past 48 hour(s))  Lipase, blood     Status: None   Collection Time: 06/04/16  6:00 PM  Result Value Ref Range   Lipase 16 11 - 51 U/L  Comprehensive metabolic panel     Status: Abnormal   Collection Time: 06/04/16  6:00 PM  Result Value Ref Range   Sodium 137 135 - 145 mmol/L   Potassium 3.8 3.5 - 5.1 mmol/L   Chloride 101 101 - 111 mmol/L   CO2 25 22 - 32 mmol/L   Glucose, Bld 151 (H) 65 - 99 mg/dL   BUN 13 6 - 20 mg/dL   Creatinine, Ser 0.94 0.44 - 1.00 mg/dL  Calcium 9.1 8.9 - 10.3 mg/dL   Total Protein 6.9 6.5 - 8.1 g/dL   Albumin 3.9 3.5 - 5.0 g/dL   AST 636 (H) 15 - 41 U/L   ALT 384 (H) 14 - 54 U/L   Alkaline Phosphatase 240 (H) 38 - 126 U/L   Total Bilirubin 1.5 (H) 0.3 - 1.2 mg/dL   GFR calc non Af Amer >60 >60 mL/min   GFR calc Af Amer >60 >60 mL/min    Comment: (NOTE) The eGFR has been calculated using the CKD EPI equation. This calculation has not been validated in all clinical situations. eGFR's persistently <60 mL/min signify possible Chronic  Kidney Disease.    Anion gap 11 5 - 15  CBC     Status: None   Collection Time: 06/04/16  6:00 PM  Result Value Ref Range   WBC 9.6 4.0 - 10.5 K/uL   RBC 5.00 3.87 - 5.11 MIL/uL   Hemoglobin 14.3 12.0 - 15.0 g/dL   HCT 42.1 36.0 - 46.0 %   MCV 84.2 78.0 - 100.0 fL   MCH 28.6 26.0 - 34.0 pg   MCHC 34.0 30.0 - 36.0 g/dL   RDW 12.4 11.5 - 15.5 %   Platelets 229 150 - 400 K/uL  Urinalysis, Routine w reflex microscopic     Status: Abnormal   Collection Time: 06/04/16  9:20 PM  Result Value Ref Range   Color, Urine YELLOW YELLOW   APPearance CLEAR CLEAR   Specific Gravity, Urine 1.017 1.005 - 1.030   pH 6.5 5.0 - 8.0   Glucose, UA NEGATIVE NEGATIVE mg/dL   Hgb urine dipstick NEGATIVE NEGATIVE   Bilirubin Urine SMALL (A) NEGATIVE   Ketones, ur NEGATIVE NEGATIVE mg/dL   Protein, ur NEGATIVE NEGATIVE mg/dL   Nitrite POSITIVE (A) NEGATIVE   Leukocytes, UA SMALL (A) NEGATIVE  Urine microscopic-add on     Status: Abnormal   Collection Time: 06/04/16  9:20 PM  Result Value Ref Range   Squamous Epithelial / LPF 0-5 (A) NONE SEEN   WBC, UA 0-5 0 - 5 WBC/hpf   RBC / HPF NONE SEEN 0 - 5 RBC/hpf   Bacteria, UA FEW (A) NONE SEEN  I-Stat CG4 Lactic Acid, ED     Status: None   Collection Time: 06/04/16 10:09 PM  Result Value Ref Range   Lactic Acid, Venous 1.04 0.5 - 1.9 mmol/L   Ct Abdomen Pelvis W Contrast  Result Date: 06/05/2016 CLINICAL DATA:  60 y/o F; epigastric abdominal pain with nausea onset today. History of cholecystectomy 05/23/2016. EXAM: CT ABDOMEN AND PELVIS WITH CONTRAST TECHNIQUE: Multidetector CT imaging of the abdomen and pelvis was performed using the standard protocol following bolus administration of intravenous contrast. CONTRAST:  19m ISOVUE-300 IOPAMIDOL (ISOVUE-300) INJECTION 61% COMPARISON:  03/04/2016 MRI abdomen. FINDINGS: Lower chest: No acute abnormality. Hepatobiliary: Severe intra and extrahepatic biliary ductal dilatation in a similar configuration to  prior MRI of the abdomen. Heterogeneity of enhancement within the liver predominantly in the right hepatic lobe around bile ducts. Status post cholecystectomy. Rim enhancing fluid collection within the gallbladder fossa measuring 20 x 7 x 48 mm (AP x ML x CC), series 5, image 37 and series 6, image 40. Pancreas: Unremarkable. No pancreatic ductal dilatation or surrounding inflammatory changes. Spleen: Normal in size without focal abnormality. Adrenals/Urinary Tract: Adrenal glands are unremarkable. Kidneys are normal, without renal calculi, focal lesion, or hydronephrosis. Bladder is unremarkable. Stomach/Bowel: Small hiatal hernia. Vascular/Lymphatic: Aortic atherosclerosis. No enlarged abdominal or  pelvic lymph nodes. Reproductive: Uterus and bilateral adnexa are unremarkable. Bilateral tubal ligation. Other: No abdominal wall hernia or abnormality. No abdominopelvic ascites. Musculoskeletal: No acute or significant osseous findings. Mild lower lumbar levo curvature. Degenerative changes of lower lumbar spine greatest at L5-S1 and lower lumbar facet arthrosis. IMPRESSION: 1. Rim enhancing fluid collection within the gallbladder fossa may be postoperative. Bile leak and infection are also possible. 2. Heterogeneous liver enhancement concentrated around bile ducts may represent infectious/inflammatory hepatitis or cholangitis. 3. 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 4. Small hiatal hernia. 5. Aortic atherosclerosis. Electronically Signed   By: Kristine Garbe M.D.   On: 06/05/2016 00:48    Review of Systems  Constitutional: Negative for chills, fever and weight loss.  HENT: Negative for nosebleeds.   Eyes: Negative for blurred vision.  Respiratory: Negative for shortness of breath.   Cardiovascular: Negative for chest pain, palpitations, orthopnea and PND.       Denies DOE  Gastrointestinal: Positive for  abdominal pain, nausea and vomiting.  Genitourinary: Negative for dysuria and hematuria.  Musculoskeletal: Negative.   Skin: Negative for itching and rash.  Neurological: Negative for dizziness, focal weakness, seizures, loss of consciousness and headaches.       Denies TIAs, amaurosis fugax  Endo/Heme/Allergies: Does not bruise/bleed easily.  Psychiatric/Behavioral: The patient is not nervous/anxious.     Blood pressure 103/61, pulse 63, temperature 98 F (36.7 C), temperature source Oral, resp. rate 17, height '5\' 2"'  (1.575 m), weight 54.4 kg (120 lb), SpO2 97 %. Physical Exam  Vitals reviewed. Constitutional: She is oriented to person, place, and time. She appears well-developed and well-nourished. No distress.  HENT:  Head: Normocephalic and atraumatic.  Right Ear: External ear normal.  Left Ear: External ear normal.  Eyes: Conjunctivae are normal. No scleral icterus.  Neck: Normal range of motion. Neck supple. No tracheal deviation present. No thyromegaly present.  Cardiovascular: Normal rate and normal heart sounds.   Respiratory: Effort normal and breath sounds normal. No stridor. No respiratory distress. She has no wheezes.  GI: Soft. She exhibits no distension. There is no tenderness. There is no rebound.  Healing incisions  Musculoskeletal: She exhibits no edema or tenderness.  Lymphadenopathy:    She has no cervical adenopathy.  Neurological: She is alert and oriented to person, place, and time. She exhibits normal muscle tone.  Skin: Skin is warm and dry. No rash noted. She is not diaphoretic. No erythema. No pallor.  Psychiatric: She has a normal mood and affect. Her behavior is normal. Judgment and thought content normal.     Assessment/Plan Status post laparoscopic cholecystectomy with cholangiogram October 2 by Dr. Zella Richer Abdominal pain with nausea and vomiting Elevated liver function test Biliary ductal dilatation on imaging Small gallbladder fossa fluid  collection   She has a very small gallbladder fossa fluid collection. My suspicion for a bile leak is low. I am concerned she may have some thickened sludge in her distal common bile duct which is causing biliary obstruction. We will start IV antibiotics. Repeat liver function tests in the morning. Gastroenterology consult. Discussed with Dr. Cristina Gong. Depending on liver function test results in morning may get endoscopic ultrasound for proceed with ERCP in the near future. He is doubtful that they will order an MRI-MRCP  We'll keep nothing by mouth except ice chips until we have a plan and time frame from gastroenterology  Leighton Ruff. Redmond Pulling, MD, [place],  B[facility], [facility], [place]   Gayland Curry, MD 06/05/2016, 1:17 AM

## 2016-06-05 NOTE — Progress Notes (Signed)
Have discussed case w/ pt and Dr. Anders Grant is for ERCP today.  Full note to follow.  Cleotis Nipper, M.D. Pager [phone] If no answer or after 5 PM call [phone]

## 2016-06-05 NOTE — Progress Notes (Signed)
Pharmacy Antibiotic Note  Jasmine Bradford is a 60 y.o. female admitted on 06/04/2016 with intra-abdominal infection.  Pharmacy has been consulted for Zosyn dosing. Pt with possible biliary obstruction. WBC WNL. Renal function good. Recent cholecystectomy.   Plan: -Zosyn 3.375G IV q8h to be infused over 4 hours -Trend WBC, temp, renal function  -F/U GI/surgery plans  Height: 5\' 2"  (157.5 cm) Weight: 120 lb (54.4 kg) IBW/kg (Calculated) : 50.1  Temp (24hrs), Avg:98 F (36.7 C), Min:98 F (36.7 C), Max:98 F (36.7 C)   Recent Labs Lab 06/04/16 1800 06/04/16 2209  WBC 9.6  --   CREATININE 0.94  --   LATICACIDVEN  --  1.04    Estimated Creatinine Clearance: 49.1 mL/min (by C-G formula based on SCr of 0.94 mg/dL).    Allergies  Allergen Reactions  . Oxycodone Nausea And Vomiting and Other (See Comments)    Also made patient lightheaded and lethargic     Jasmine Bradford 06/05/2016 1:49 AM

## 2016-06-06 ENCOUNTER — Encounter (HOSPITAL_COMMUNITY): Admission: EM | Disposition: A | Discharge status: Home / Self Care | Payer: Self-pay | Source: Home / Self Care

## 2016-06-06 ENCOUNTER — Inpatient Hospital Stay (HOSPITAL_COMMUNITY): Payer: 59 | Admitting: Certified Registered"

## 2016-06-06 ENCOUNTER — Encounter (HOSPITAL_COMMUNITY): Payer: Self-pay | Admitting: *Deleted

## 2016-06-06 ENCOUNTER — Inpatient Hospital Stay (HOSPITAL_COMMUNITY): Payer: 59

## 2016-06-06 HISTORY — PX: ENDOSCOPIC RETROGRADE CHOLANGIOPANCREATOGRAPHY (ERCP) WITH PROPOFOL: SHX5810

## 2016-06-06 LAB — URINE CULTURE: SPECIAL REQUESTS: NORMAL

## 2016-06-06 LAB — COMPREHENSIVE METABOLIC PANEL
ALBUMIN: 3 g/dL — AB (ref 3.5–5.0)
ALT: 800 U/L — ABNORMAL HIGH (ref 14–54)
ANION GAP: 7 (ref 5–15)
AST: 520 U/L — AB (ref 15–41)
Alkaline Phosphatase: 247 U/L — ABNORMAL HIGH (ref 38–126)
BUN: 5 mg/dL — AB (ref 6–20)
CHLORIDE: 109 mmol/L (ref 101–111)
CO2: 26 mmol/L (ref 22–32)
Calcium: 8.6 mg/dL — ABNORMAL LOW (ref 8.9–10.3)
Creatinine, Ser: 1.09 mg/dL — ABNORMAL HIGH (ref 0.44–1.00)
GFR calc Af Amer: >60 mL/min (ref >60)
GFR calc non Af Amer: 53 mL/min — ABNORMAL LOW (ref >60)
GLUCOSE: 111 mg/dL — AB (ref 65–99)
POTASSIUM: 4.1 mmol/L (ref 3.5–5.1)
SODIUM: 142 mmol/L (ref 135–145)
Total Bilirubin: 3.2 mg/dL — ABNORMAL HIGH (ref 0.3–1.2)
Total Protein: 5.9 g/dL — ABNORMAL LOW (ref 6.5–8.1)

## 2016-06-06 LAB — CBC WITH DIFFERENTIAL/PLATELET
BASOS ABS: 0 10*3/uL (ref 0.0–0.1)
BASOS PCT: 1 %
EOS ABS: 0.5 10*3/uL (ref 0.0–0.7)
EOS PCT: 8 %
HEMATOCRIT: 38.5 % (ref 36.0–46.0)
Hemoglobin: 12.9 g/dL (ref 12.0–15.0)
Lymphocytes Relative: 24 %
Lymphs Abs: 1.5 10*3/uL (ref 0.7–4.0)
MCH: 29.1 pg (ref 26.0–34.0)
MCHC: 33.5 g/dL (ref 30.0–36.0)
MCV: 86.9 fL (ref 78.0–100.0)
MONO ABS: 0.4 10*3/uL (ref 0.1–1.0)
Monocytes Relative: 7 %
NEUTROS ABS: 3.7 10*3/uL (ref 1.7–7.7)
Neutrophils Relative %: 60 %
PLATELETS: 185 10*3/uL (ref 150–400)
RBC: 4.43 MIL/uL (ref 3.87–5.11)
RDW: 12.9 % (ref 11.5–15.5)
WBC: 6.1 10*3/uL (ref 4.0–10.5)

## 2016-06-06 LAB — ABO/RH: ABO/RH(D): A POS

## 2016-06-06 LAB — TYPE AND SCREEN
ABO/RH(D): A POS
Antibody Screen: NEGATIVE

## 2016-06-06 LAB — PROTIME-INR
INR: 1.12
PROTHROMBIN TIME: 14.4 s (ref 11.4–15.2)

## 2016-06-06 LAB — LIPASE, BLOOD: Lipase: 16 U/L (ref 11–51)

## 2016-06-06 SURGERY — ENDOSCOPIC RETROGRADE CHOLANGIOPANCREATOGRAPHY (ERCP) WITH PROPOFOL
Anesthesia: General

## 2016-06-06 MED ORDER — LACTATED RINGERS IV SOLN
INTRAVENOUS | Status: DC | PRN
  Administered 2016-06-06 (×2): via INTRAVENOUS

## 2016-06-06 MED ORDER — PROMETHAZINE HCL 25 MG/ML IJ SOLN: 6.2500 mg | INTRAMUSCULAR | Status: DC | PRN

## 2016-06-06 MED ORDER — EPHEDRINE SULFATE 50 MG/ML IJ SOLN
INTRAMUSCULAR | Status: DC | PRN
  Administered 2016-06-06: 10 mg via INTRAVENOUS

## 2016-06-06 MED ORDER — FENTANYL CITRATE (PF) 100 MCG/2ML IJ SOLN: 25.0000 ug | INTRAMUSCULAR | Status: DC | PRN

## 2016-06-06 MED ORDER — SODIUM CHLORIDE 0.9 % IV SOLN
INTRAVENOUS | Status: DC | PRN
  Administered 2016-06-06: 08:00:00

## 2016-06-06 MED ORDER — IOPAMIDOL (ISOVUE-300) INJECTION 61%
INTRAVENOUS | Status: AC
Start: 2016-06-06 — End: 2016-06-06
  Filled 2016-06-06: qty 50

## 2016-06-06 MED ORDER — SUCCINYLCHOLINE CHLORIDE 20 MG/ML IJ SOLN
INTRAMUSCULAR | Status: DC | PRN
  Administered 2016-06-06: 100 mg via INTRAVENOUS

## 2016-06-06 MED ORDER — CHLORHEXIDINE GLUCONATE 0.12 % MT SOLN
15.0000 mL | Freq: Two times a day (BID) | OROMUCOSAL | Status: DC
  Administered 2016-06-06 – 2016-06-07 (×4): 15 mL via OROMUCOSAL
  Filled 2016-06-06 (×2): qty 15

## 2016-06-06 MED ORDER — LIDOCAINE HCL (CARDIAC) 20 MG/ML IV SOLN
INTRAVENOUS | Status: DC | PRN
  Administered 2016-06-06: 80 mg via INTRAVENOUS

## 2016-06-06 MED ORDER — KETOROLAC TROMETHAMINE 30 MG/ML IJ SOLN: 30.0000 mg | Freq: Once | INTRAMUSCULAR | Status: DC | PRN

## 2016-06-06 MED ORDER — DEXAMETHASONE SODIUM PHOSPHATE 10 MG/ML IJ SOLN
INTRAMUSCULAR | Status: DC | PRN
  Administered 2016-06-06: 10 mg via INTRAVENOUS

## 2016-06-06 MED ORDER — FENTANYL CITRATE (PF) 100 MCG/2ML IJ SOLN
INTRAMUSCULAR | Status: DC | PRN
  Administered 2016-06-06: 100 ug via INTRAVENOUS

## 2016-06-06 MED ORDER — MIDAZOLAM HCL 5 MG/5ML IJ SOLN
INTRAMUSCULAR | Status: DC | PRN
  Administered 2016-06-06: 2 mg via INTRAVENOUS

## 2016-06-06 MED ORDER — GLUCAGON HCL RDNA (DIAGNOSTIC) 1 MG IJ SOLR
INTRAMUSCULAR | Status: AC
  Filled 2016-06-06: qty 1

## 2016-06-06 MED ORDER — ORAL CARE MOUTH RINSE
15.0000 mL | Freq: Two times a day (BID) | OROMUCOSAL | Status: DC
  Administered 2016-06-06 (×2): 15 mL via OROMUCOSAL

## 2016-06-06 MED ORDER — SODIUM CHLORIDE 0.9 % IV SOLN: INTRAVENOUS | Status: DC

## 2016-06-06 MED ORDER — PROPOFOL 10 MG/ML IV BOLUS
INTRAVENOUS | Status: DC | PRN
  Administered 2016-06-06: 120 mg via INTRAVENOUS

## 2016-06-06 NOTE — Anesthesia Preprocedure Evaluation (Signed)
Anesthesia Evaluation  Patient identified by MRN, date of birth, ID band Patient awake    Reviewed: Allergy & Precautions, NPO status , Patient's Chart, lab work & pertinent test results  Airway Mallampati: II  TM Distance: >3 FB Neck ROM: Full    Dental no notable dental hx.    Pulmonary neg pulmonary ROS, former smoker,    Pulmonary exam normal breath sounds clear to auscultation       Cardiovascular negative cardio ROS Normal cardiovascular exam Rhythm:Regular Rate:Normal     Neuro/Psych negative neurological ROS  negative psych ROS   GI/Hepatic negative GI ROS, Neg liver ROS,   Endo/Other  negative endocrine ROS  Renal/GU negative Renal ROS  negative genitourinary   Musculoskeletal negative musculoskeletal ROS (+)   Abdominal   Peds negative pediatric ROS (+)  Hematology negative hematology ROS (+)   Anesthesia Other Findings   Reproductive/Obstetrics negative OB ROS                             Anesthesia Physical Anesthesia Plan  ASA: II  Anesthesia Plan: General   Post-op Pain Management:    Induction: Intravenous  Airway Management Planned: Oral ETT  Additional Equipment:   Intra-op Plan:   Post-operative Plan: Extubation in OR  Informed Consent: I have reviewed the patients History and Physical, chart, labs and discussed the procedure including the risks, benefits and alternatives for the proposed anesthesia with the patient or authorized representative who has indicated his/her understanding and acceptance.   Dental advisory given  Plan Discussed with: CRNA and Surgeon  Anesthesia Plan Comments:         Anesthesia Quick Evaluation  

## 2016-06-06 NOTE — Anesthesia Postprocedure Evaluation (Signed)
Anesthesia Post Note  Patient: Jasmine Bradford  Procedure(s) Performed: Procedure(s) (LRB): ENDOSCOPIC RETROGRADE CHOLANGIOPANCREATOGRAPHY (ERCP) WITH PROPOFOL (N/A)  Patient location during evaluation: PACU Anesthesia Type: General Level of consciousness: awake and alert Pain management: pain level controlled Vital Signs Assessment: post-procedure vital signs reviewed and stable Respiratory status: spontaneous breathing, nonlabored ventilation, respiratory function stable and patient connected to nasal cannula oxygen Cardiovascular status: blood pressure returned to baseline and stable Postop Assessment: no signs of nausea or vomiting Anesthetic complications: no    Last Vitals:  Vitals:   06/06/16 0830 06/06/16 0840  BP: (!) 147/71 (!) 132/57  Pulse: 92 86  Resp: 16 16  Temp: 36.7 C     Last Pain:  Vitals:   06/06/16 0830  TempSrc: Oral  PainSc:                  [name] S

## 2016-06-06 NOTE — Op Note (Addendum)
[facility] Patient Name: Jasmine Bradford Procedure Date : 06/06/2016 MRN: [id number] Attending MD: Clarene Essex , MD Date of Birth: 1955-10-08 CSN: [id number] Age: 60 Admit Type: Inpatient Procedure:                ERCP Indications:              Suspected bile duct stone(s) Providers:                Clarene Essex, MD, Cleda Daub, RN, [facility], Technician, Lance Coon, CRNA Referring MD:              Medicines:                General Anesthesia Complications:            No immediate complications. Estimated Blood Loss:     Estimated blood loss: none. Procedure:                Pre-Anesthesia Assessment:                           - Prior to the procedure, a History and Physical                            was performed, and patient medications and                            allergies were reviewed. The patient's tolerance of                            previous anesthesia was also reviewed. The risks                            and benefits of the procedure and the sedation                            options and risks were discussed with the patient.                            All questions were answered, and informed consent                            was obtained. Prior Anticoagulants: The patient has                            taken no previous anticoagulant or antiplatelet                            agents. ASA Grade Assessment: II - A patient with                            mild systemic disease. After reviewing the risks  and benefits, the patient was deemed in                            satisfactory condition to undergo the procedure.                           After obtaining informed consent, the scope was                            passed under direct vision. Throughout the                            procedure, the patient's blood pressure, pulse, and                            oxygen saturations were  monitored continuously. The                            [id number] [id number]) scope was introduced through                            the mouth, and used to inject contrast into and                            used to locate the major papilla. The ERCP was                            accomplished without difficulty. The patient                            tolerated the procedure well. Scope In: Scope Out: Findings:      her ampulla had signs of previous sphincterotomy and deep selective       cannulation was easily obtained and there were signs of CBD stones on       the initial cholangiogram and there was no pancreatic duct injection or       wire advancement throughout the procedure and we proceeded with the       Biliary sphincterotomy was made with a Hydratome sphincterotome using       ERBE electrocautery. this was done until we had adequate biliary       drainage and could get the fully bowed sphincterotome easily in and out       of the duct and a stone was removed with the sphincterotome on       withdrawal There was no post-sphincterotomy bleeding. The biliary tree       was swept with a 12-15 mm balloon starting at the bifurcation. Sludge       and one more stone was swept from the duct. Two stones were removed. No       stones remained. Nothing was found on multiple subsequent balloon       pull-through was using both the 12th and the 15 mm balloon and onto       occlusion cholangiograms no residual abnormality was seen and there was       adequate biliary drainage and the balloon and the wire were removed  and       the scope was removed and the patient tolerated the procedure well and       there is no signs of bile leak throughout the procedure. Impression:               - Choledocholithiasis was found. Complete removal                            was accomplished by biliary sphincterotomy and                            balloon extraction.                           - A biliary  sphincterotomy was performed.                           - The biliary tree was swept and nothing was found                            at the end of the procedure. Recommendation:           - Clear liquid diet for 6 hours.then slowly advance                            as tolerated                           - Continue present medications.                           - Return to GI clinic in 2 weeks.to recheck                            symptoms and make sure liver tests are back to                            normal                           - Telephone GI clinic if symptomatic PRN.                           - Check liver enzymes (AST, ALT, alkaline                            phosphatase, bilirubin) in the morning.and as an                            outpatient to confirm back to normal Procedure Code(s):        --- Professional ---                           [phone], Esophagogastroduodenoscopy, flexible,                            transoral;  diagnostic, including collection of                            specimen(s) by brushing or washing, when performed                            (separate procedure) Diagnosis Code(s):        --- Professional ---                           K80.50, Calculus of bile duct without cholangitis                            or cholecystitis without obstruction CPT copyright 2016 [employer]. All rights reserved. The codes documented in this report are preliminary and upon coder review may  be revised to meet current compliance requirements. Clarene Essex, MD 06/06/2016 8:26:25 AM This report has been signed electronically. Number of Addenda: 0

## 2016-06-06 NOTE — Progress Notes (Signed)
Day of Surgery  Subjective: Feels better after ERCP.  Findings noted.  Objective: Vital signs in last 24 hours: Temp:  [98 F (36.7 C)-98.8 F (37.1 C)] 98.1 F (36.7 C) (10/16 0830) Pulse Rate:  [50-92] 64 (10/16 0900) Resp:  [11-18] 11 (10/16 0900) BP: (79-147)/(47-71) 135/56 (10/16 0900) SpO2:  [97 %-99 %] 98 % (10/16 0900) Last BM Date: 06/04/16  Intake/Output from previous day: 10/15 0701 - 10/16 0700 In: 2692.7 [P.O.:100; I.V.:2492.7; IV Piggyback:100] Out: 751 [Urine:751] Intake/Output this shift: Total I/O In: 1490 [I.V.:1390; IV Piggyback:100] Out: 0   PE: General- In NAD Abdomen-soft, wounds clean and intact  Lab Results:   Recent Labs  06/05/16 0455 06/06/16 0427  WBC 9.9 6.1  HGB 12.7 12.9  HCT 38.0 38.5  PLT 204 185   BMET  Recent Labs  06/05/16 0455 06/06/16 0427  NA 143 142  K 4.5 4.1  CL 106 109  CO2 28 26  GLUCOSE 145* 111*  BUN 11 5*  CREATININE 0.90 1.09*  CALCIUM 9.0 8.6*   PT/INR  Recent Labs  06/06/16 0427  LABPROT 14.4  INR 1.12   Comprehensive Metabolic Panel:    Component Value Date/Time   NA 142 06/06/2016 0427   NA 143 06/05/2016 0455   K 4.1 06/06/2016 0427   K 4.5 06/05/2016 0455   CL 109 06/06/2016 0427   CL 106 06/05/2016 0455   CO2 26 06/06/2016 0427   CO2 28 06/05/2016 0455   BUN 5 (L) 06/06/2016 0427   BUN 11 06/05/2016 0455   CREATININE 1.09 (H) 06/06/2016 0427   CREATININE 0.90 06/05/2016 0455   GLUCOSE 111 (H) 06/06/2016 0427   GLUCOSE 145 (H) 06/05/2016 0455   CALCIUM 8.6 (L) 06/06/2016 0427   CALCIUM 9.0 06/05/2016 0455   AST 520 (H) 06/06/2016 0427   AST 1,108 (H) 06/05/2016 0455   ALT 800 (H) 06/06/2016 0427   ALT 857 (H) 06/05/2016 0455   ALKPHOS 247 (H) 06/06/2016 0427   ALKPHOS 246 (H) 06/05/2016 0455   BILITOT 3.2 (H) 06/06/2016 0427   BILITOT 2.5 (H) 06/05/2016 0455   PROT 5.9 (L) 06/06/2016 0427   PROT 6.1 (L) 06/05/2016 0455   ALBUMIN 3.0 (L) 06/06/2016 0427   ALBUMIN 3.2 (L)  06/05/2016 0455     Studies/Results: Ct Abdomen Pelvis W Contrast  Result Date: 06/05/2016 CLINICAL DATA:  60 y/o F; epigastric abdominal pain with nausea onset today. History of cholecystectomy 05/23/2016. EXAM: CT ABDOMEN AND PELVIS WITH CONTRAST TECHNIQUE: Multidetector CT imaging of the abdomen and pelvis was performed using the standard protocol following bolus administration of intravenous contrast. CONTRAST:  151mL ISOVUE-300 IOPAMIDOL (ISOVUE-300) INJECTION 61% COMPARISON:  03/04/2016 MRI abdomen. FINDINGS: Lower chest: No acute abnormality. 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 Heterogeneity of enhancement within the liver predominantly in the right hepatic lobe around bile ducts. Status post cholecystectomy. Rim enhancing fluid collection within the gallbladder fossa measuring 20 x 7 x 48 mm (AP x ML x CC), series 5, image 37 and series 6, image 40. Pancreas: Unremarkable. No pancreatic ductal dilatation or surrounding inflammatory changes. Spleen: Normal in size without focal abnormality. Adrenals/Urinary Tract: Adrenal glands are unremarkable. Kidneys are normal, without renal calculi, focal lesion, or hydronephrosis. Bladder is unremarkable. Stomach/Bowel: Small hiatal hernia. Vascular/Lymphatic: Aortic atherosclerosis. No enlarged abdominal or pelvic lymph nodes. Reproductive: Uterus and bilateral adnexa are unremarkable. Bilateral tubal ligation. Other: No abdominal wall hernia or  abnormality. No abdominopelvic ascites. Musculoskeletal: No acute or significant osseous findings. Mild lower lumbar levo curvature. Degenerative changes of lower lumbar spine greatest at L5-S1 and lower lumbar facet arthrosis. IMPRESSION: 1. Rim enhancing fluid collection within the gallbladder fossa may be postoperative. Bile leak and infection are also possible. 2. Heterogeneous liver enhancement concentrated around bile ducts  may represent infectious/inflammatory hepatitis or cholangitis. 3. 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 4. Small hiatal hernia. 5. Aortic atherosclerosis. Electronically Signed   By: Kristine Garbe M.D.   On: 06/05/2016 00:48   Dg Ercp  Result Date: 06/06/2016 CLINICAL DATA:  Common bile duct stone. EXAM: ERCP TECHNIQUE: Multiple spot images obtained with the fluoroscopic device and submitted for interpretation post-procedure. COMPARISON:  CT abdomen and pelvis - 06/05/2016; intraoperative cholangiogram during laparoscopic cholecystectomy - 05/23/2016 FINDINGS: Seven spot intraoperative fluoroscopic images of the right upper abdominal quadrant during ERCP are provided for review. Initial image demonstrates an endoscope overlying the right upper abdominal quadrant with selective cannulation opacification of the common bile duct. Surgical clips overlie the expected location of the cystic duct. Re- demonstrated moderate dilatation of the common bile duct. There is apparent ill-defined material within the central aspect of the common bile duct, central to the expected confluence of the cystic duct, potentially indicative biliary sludge. There is minimal opacification of the cystic duct. No definitive pancreatic ductal dilatation. Subsequent image demonstrate insufflation of a balloon within the central aspect of the common bile duct with subsequent presumed sweeping and presumed biliary sphincterotomy. IMPRESSION: ERCP with biliary sweeping and presumed sphincterotomy as above. These images were submitted for radiologic interpretation only. Please see the procedural report for the amount of contrast and the fluoroscopy time utilized. Electronically Signed   By: Sandi Mariscal M.D.   On: 06/06/2016 09:17    Anti-infectives: Anti-infectives    Start     Dose/Rate Route Frequency Ordered Stop   06/05/16 0200   piperacillin-tazobactam (ZOSYN) IVPB 3.375 g     3.375 g 12.5 mL/hr over 240 Minutes Intravenous Every 8 hours 06/05/16 0152        Assessment Active Problems:  Recurrent choledocholithiasis s/p ERCP today with stone and sludge extraction.    LOS: 1 day   Plan: Observe overnight and then home in AM if doing okay.   Jasmine Bradford 06/06/2016

## 2016-06-06 NOTE — Transfer of Care (Signed)
Immediate Anesthesia Transfer of Care Note  Patient: Jasmine Bradford  Procedure(s) Performed: Procedure(s): ENDOSCOPIC RETROGRADE CHOLANGIOPANCREATOGRAPHY (ERCP) WITH PROPOFOL (N/A)  Patient Location: Endoscopy Unit  Anesthesia Type:General  Level of Consciousness: awake and patient cooperative  Airway & Oxygen Therapy: Patient Spontanous Breathing  Post-op Assessment: Report given to RN and Post -op Vital signs reviewed and stable  Post vital signs: Reviewed and stable  Last Vitals:  Vitals:   06/05/16 2045 06/06/16 0659  BP: (!) 79/47 (!) 100/53  Pulse: (!) 56 (!) 51  Resp: 17 18  Temp: 36.7 C 36.9 C    Last Pain:  Vitals:   06/06/16 0659  TempSrc: Oral  PainSc:       Patients Stated Pain Goal: 0 ([id number])  Complications: No apparent anesthesia complications

## 2016-06-06 NOTE — Anesthesia Procedure Notes (Signed)
Procedure Name: Intubation Date/Time: 06/06/2016 7:46 AM Performed by: Lance Coon Pre-anesthesia Checklist: Patient identified, Emergency Drugs available, Suction available, Patient being monitored and Timeout performed Patient Re-evaluated:Patient Re-evaluated prior to inductionOxygen Delivery Method: Circle system utilized Preoxygenation: Pre-oxygenation with 100% oxygen Intubation Type: IV induction Ventilation: Mask ventilation without difficulty Laryngoscope Size: Miller and 2 Grade View: Grade I Tube type: Oral Tube size: 7.0 mm Number of attempts: 1 Airway Equipment and Method: Stylet Placement Confirmation: ETT inserted through vocal cords under direct vision,  positive ETCO2 and breath sounds checked- equal and bilateral Secured at: 21 cm Tube secured with: Tape Dental Injury: Teeth and Oropharynx as per pre-operative assessment

## 2016-06-06 NOTE — Progress Notes (Addendum)
Jasmine Bradford 7:37 AM  Subjective: Patient doing better and we reviewed her history and her hospital computer chart and our office computer chart as well as her studies to date and she was fine between her surgery and this spell coming on and was fine after my ERCP although she has had some mild a.m. nausea for a while probably even before the ERCP she has no other complaints  Objective: Vital signs stable afebrile no acute distress exam please see preassessment evaluation labs improved a little except bilirubin ,CT Intra-Op cholangiogram reviewed  Assessment: Probable residual CBD stones  Plan: Okay to proceed with ERCP with anesthesia assistance  [facility] E  Pager [phone] After 5PM or if no answer call [phone]

## 2016-06-07 LAB — COMPREHENSIVE METABOLIC PANEL
ALK PHOS: 217 U/L — AB (ref 38–126)
ALT: 514 U/L — ABNORMAL HIGH (ref 14–54)
ANION GAP: 5 (ref 5–15)
AST: 142 U/L — ABNORMAL HIGH (ref 15–41)
Albumin: 2.7 g/dL — ABNORMAL LOW (ref 3.5–5.0)
BUN: 5 mg/dL — ABNORMAL LOW (ref 6–20)
CALCIUM: 8.4 mg/dL — AB (ref 8.9–10.3)
CHLORIDE: 112 mmol/L — AB (ref 101–111)
CO2: 23 mmol/L (ref 22–32)
Creatinine, Ser: 0.95 mg/dL (ref 0.44–1.00)
Glucose, Bld: 143 mg/dL — ABNORMAL HIGH (ref 65–99)
Potassium: 4.1 mmol/L (ref 3.5–5.1)
SODIUM: 140 mmol/L (ref 135–145)
Total Bilirubin: 1.1 mg/dL (ref 0.3–1.2)
Total Protein: 5.4 g/dL — ABNORMAL LOW (ref 6.5–8.1)

## 2016-06-07 LAB — CBC WITH DIFFERENTIAL/PLATELET
Basophils Absolute: 0 10*3/uL (ref 0.0–0.1)
Basophils Relative: 0 %
EOS ABS: 0.1 10*3/uL (ref 0.0–0.7)
EOS PCT: 1 %
HCT: 34.6 % — ABNORMAL LOW (ref 36.0–46.0)
Hemoglobin: 11.7 g/dL — ABNORMAL LOW (ref 12.0–15.0)
LYMPHS ABS: 1.9 10*3/uL (ref 0.7–4.0)
Lymphocytes Relative: 20 %
MCH: 29 pg (ref 26.0–34.0)
MCHC: 33.8 g/dL (ref 30.0–36.0)
MCV: 85.6 fL (ref 78.0–100.0)
MONOS PCT: 6 %
Monocytes Absolute: 0.6 10*3/uL (ref 0.1–1.0)
Neutro Abs: 7 10*3/uL (ref 1.7–7.7)
Neutrophils Relative %: 73 %
PLATELETS: 164 10*3/uL (ref 150–400)
RBC: 4.04 MIL/uL (ref 3.87–5.11)
RDW: 12.9 % (ref 11.5–15.5)
WBC: 9.7 10*3/uL (ref 4.0–10.5)

## 2016-06-07 NOTE — Progress Notes (Signed)
1 Day Post-Op  Subjective: No pain.  Passing gas.  Tolerated solid food last night.  Objective: Vital signs in last 24 hours: Temp:  [98.1 F (36.7 C)-98.5 F (36.9 C)] 98.4 F (36.9 C) (10/17 0547) Pulse Rate:  [52-92] 52 (10/17 0547) Resp:  [11-17] 16 (10/17 0547) BP: (103-147)/(51-71) 103/51 (10/17 0547) SpO2:  [97 %-99 %] 98 % (10/17 0547) Last BM Date: 06/04/16  Intake/Output from previous day: 10/16 0701 - 10/17 0700 In: 3683.3 [P.O.:60; I.V.:3373.3; IV Piggyback:250] Out: 0  Intake/Output this shift: Total I/O In: 233.3 [I.V.:233.3] Out: -   PE: General- In NAD Abdomen-soft, wounds clean and intact, no tenderness  Lab Results:   Recent Labs  06/06/16 0427 06/07/16 0518  WBC 6.1 9.7  HGB 12.9 11.7*  HCT 38.5 34.6*  PLT 185 164   BMET  Recent Labs  06/06/16 0427 06/07/16 0518  NA 142 140  K 4.1 4.1  CL 109 112*  CO2 26 23  GLUCOSE 111* 143*  BUN 5* 5*  CREATININE 1.09* 0.95  CALCIUM 8.6* 8.4*   PT/INR  Recent Labs  06/06/16 0427  LABPROT 14.4  INR 1.12   Comprehensive Metabolic Panel:    Component Value Date/Time   NA 140 06/07/2016 0518   NA 142 06/06/2016 0427   K 4.1 06/07/2016 0518   K 4.1 06/06/2016 0427   CL 112 (H) 06/07/2016 0518   CL 109 06/06/2016 0427   CO2 23 06/07/2016 0518   CO2 26 06/06/2016 0427   BUN 5 (L) 06/07/2016 0518   BUN 5 (L) 06/06/2016 0427   CREATININE 0.95 06/07/2016 0518   CREATININE 1.09 (H) 06/06/2016 0427   GLUCOSE 143 (H) 06/07/2016 0518   GLUCOSE 111 (H) 06/06/2016 0427   CALCIUM 8.4 (L) 06/07/2016 0518   CALCIUM 8.6 (L) 06/06/2016 0427   AST 142 (H) 06/07/2016 0518   AST 520 (H) 06/06/2016 0427   ALT 514 (H) 06/07/2016 0518   ALT 800 (H) 06/06/2016 0427   ALKPHOS 217 (H) 06/07/2016 0518   ALKPHOS 247 (H) 06/06/2016 0427   BILITOT 1.1 06/07/2016 0518   BILITOT 3.2 (H) 06/06/2016 0427   PROT 5.4 (L) 06/07/2016 0518   PROT 5.9 (L) 06/06/2016 0427   ALBUMIN 2.7 (L) 06/07/2016 0518    ALBUMIN 3.0 (L) 06/06/2016 0427     Studies/Results: Dg Ercp  Result Date: 06/06/2016 CLINICAL DATA:  Common bile duct stone. EXAM: ERCP TECHNIQUE: Multiple spot images obtained with the fluoroscopic device and submitted for interpretation post-procedure. COMPARISON:  CT abdomen and pelvis - 06/05/2016; intraoperative cholangiogram during laparoscopic cholecystectomy - 05/23/2016 FINDINGS: Seven spot intraoperative fluoroscopic images of the right upper abdominal quadrant during ERCP are provided for review. Initial image demonstrates an endoscope overlying the right upper abdominal quadrant with selective cannulation opacification of the common bile duct. Surgical clips overlie the expected location of the cystic duct. Re- demonstrated moderate dilatation of the common bile duct. There is apparent ill-defined material within the central aspect of the common bile duct, central to the expected confluence of the cystic duct, potentially indicative biliary sludge. There is minimal opacification of the cystic duct. No definitive pancreatic ductal dilatation. Subsequent image demonstrate insufflation of a balloon within the central aspect of the common bile duct with subsequent presumed sweeping and presumed biliary sphincterotomy. IMPRESSION: ERCP with biliary sweeping and presumed sphincterotomy as above. These images were submitted for radiologic interpretation only. Please see the procedural report for the amount of contrast and the fluoroscopy time utilized.  Electronically Signed   By: Sandi Mariscal M.D.   On: 06/06/2016 09:17    Anti-infectives: Anti-infectives    Start     Dose/Rate Route Frequency Ordered Stop   06/05/16 0200  piperacillin-tazobactam (ZOSYN) IVPB 3.375 g     3.375 g 12.5 mL/hr over 240 Minutes Intravenous Every 8 hours 06/05/16 0152        Assessment Active Problems:  Recurrent choledocholithiasis s/p ERCP today with stone and sludge extraction-LFTs trending down.  Doing  well.    LOS: 2 days   Plan: Discharge today.  Instructions discussed with her in depth.  Follow up with me in one week.   Heily Carlucci J 06/07/2016

## 2016-06-07 NOTE — Discharge Instructions (Signed)
Lowfat diet.  Resume normal activities but do not push through pain.  We will talk about you returning to work when you come back for your appointment.  Avoid Tylenol for the next 2-3 weeks.  May use Advil or Motrin.  Avoid Aspirin for the next 2 days.  Call if you are having any problems.

## 2016-06-07 NOTE — Progress Notes (Signed)
AVS given to patient. IV removed. Flu vacc administered. Understanding verbalized. Transportation with husband. Belongings packed. AM meds given.

## 2016-06-09 LAB — CULTURE, BLOOD (ROUTINE X 2)
CULTURE: NO GROWTH
Culture: NO GROWTH

## 2016-06-09 NOTE — Discharge Summary (Signed)
Physician Discharge Summary  Patient ID: Jasmine Bradford MRN: [id number] DOB/AGE: May 11, 1956 60 y.o.  Admit date: 06/04/2016 Discharge date: 06/07/2016  Admission Diagnoses:  Recurrent choledocholithiasis status post laparoscopic cholecystectomy  Discharge Diagnoses:   Recurrent choledocholithiasis    Discharged Condition: good  Hospital Course: She underwent laparoscopic cholecystectomwith IOC approximately 2 weeks prior to this admission. She had choledocholithiasis and underwent ERCP preoperatively. She did well for about 2 weeks and then began having recurrent pain like she was having before. She also had nausea and vomiting. She presented to the hospital and was found to have findings consistent with recurrent choledocholithiasis.  She was admitted. She underwent repeat ERCP by Dr. Watt Climes. Stones were extracted as well as sludge. She did well after this procedure and was able to be discharged to home 06/07/2016. Discharge instructions were given to her. She will follow-up in the office in approximately one week.  Consults: GI  Treatments: ERCP 06/06/16  Discharge Exam: Blood pressure (!) 103/51, pulse (!) 52, temperature 98.4 F (36.9 C), temperature source Oral, resp. rate 16, height 5\' 2"  (1.575 m), weight 53.6 kg (118 lb 3.2 oz), SpO2 98 %.   Disposition: 01-Home or Self Care     Medication List    STOP taking these medications   acetaminophen 500 MG tablet Commonly known as:  TYLENOL   oxyCODONE 5 MG immediate release tablet Commonly known as:  Oxy IR/ROXICODONE     TAKE these medications   ondansetron 4 MG tablet Commonly known as:  ZOFRAN Take 1 tablet (4 mg total) by mouth every 4 (four) hours as needed for nausea.        Signed: [name] 06/09/2016, 9:47 AM

## 2018-05-14 ENCOUNTER — Encounter: Payer: Self-pay | Admitting: Gastroenterology

## 2018-05-22 ENCOUNTER — Other Ambulatory Visit: Payer: Self-pay | Admitting: Gastroenterology

## 2018-05-22 DIAGNOSIS — R945 Abnormal results of liver function studies: Secondary | ICD-10-CM

## 2018-05-22 DIAGNOSIS — R7989 Other specified abnormal findings of blood chemistry: Secondary | ICD-10-CM

## 2018-05-28 ENCOUNTER — Ambulatory Visit
Admission: RE | Admit: 2018-05-28 | Discharge: 2018-05-28 | Disposition: A | Discharge status: Home / Self Care | Payer: 59 | Source: Ambulatory Visit | Attending: Gastroenterology | Admitting: Gastroenterology

## 2018-05-28 DIAGNOSIS — R7989 Other specified abnormal findings of blood chemistry: Secondary | ICD-10-CM

## 2018-05-28 DIAGNOSIS — R945 Abnormal results of liver function studies: Secondary | ICD-10-CM

## 2018-06-07 ENCOUNTER — Other Ambulatory Visit: Payer: Self-pay | Admitting: Gastroenterology

## 2018-06-12 ENCOUNTER — Ambulatory Visit (HOSPITAL_COMMUNITY): Payer: 59

## 2018-06-12 ENCOUNTER — Ambulatory Visit (HOSPITAL_COMMUNITY)
Admission: RE | Admit: 2018-06-12 | Discharge: 2018-06-12 | Disposition: A | Discharge status: Home / Self Care | Payer: 59 | Source: Ambulatory Visit | Attending: Gastroenterology | Admitting: Gastroenterology

## 2018-06-12 ENCOUNTER — Other Ambulatory Visit: Payer: Self-pay | Admitting: Gastroenterology

## 2018-06-12 ENCOUNTER — Other Ambulatory Visit: Payer: Self-pay

## 2018-06-12 ENCOUNTER — Ambulatory Visit (HOSPITAL_COMMUNITY): Payer: 59 | Admitting: Anesthesiology

## 2018-06-12 ENCOUNTER — Encounter (HOSPITAL_COMMUNITY)
Admission: RE | Disposition: A | Discharge status: Home / Self Care | Payer: Self-pay | Source: Ambulatory Visit | Attending: Gastroenterology

## 2018-06-12 ENCOUNTER — Encounter (HOSPITAL_COMMUNITY): Payer: Self-pay | Admitting: *Deleted

## 2018-06-12 DIAGNOSIS — R945 Abnormal results of liver function studies: Secondary | ICD-10-CM | POA: Diagnosis not present

## 2018-06-12 DIAGNOSIS — R110 Nausea: Secondary | ICD-10-CM | POA: Diagnosis not present

## 2018-06-12 DIAGNOSIS — Z885 Allergy status to narcotic agent status: Secondary | ICD-10-CM | POA: Insufficient documentation

## 2018-06-12 DIAGNOSIS — Z79899 Other long term (current) drug therapy: Secondary | ICD-10-CM | POA: Insufficient documentation

## 2018-06-12 DIAGNOSIS — R1013 Epigastric pain: Secondary | ICD-10-CM | POA: Insufficient documentation

## 2018-06-12 DIAGNOSIS — R109 Unspecified abdominal pain: Secondary | ICD-10-CM | POA: Insufficient documentation

## 2018-06-12 DIAGNOSIS — Z87891 Personal history of nicotine dependence: Secondary | ICD-10-CM | POA: Diagnosis not present

## 2018-06-12 DIAGNOSIS — R748 Abnormal levels of other serum enzymes: Secondary | ICD-10-CM

## 2018-06-12 HISTORY — PX: REMOVAL OF STONES: SHX5545

## 2018-06-12 HISTORY — PX: SPHINCTEROTOMY: SHX5544

## 2018-06-12 HISTORY — PX: ESOPHAGOGASTRODUODENOSCOPY: SHX5428

## 2018-06-12 HISTORY — PX: ERCP: SHX5425

## 2018-06-12 SURGERY — ERCP, WITH INTERVENTION IF INDICATED
Anesthesia: General

## 2018-06-12 MED ORDER — GLUCAGON HCL RDNA (DIAGNOSTIC) 1 MG IJ SOLR
INTRAMUSCULAR | Status: AC
  Filled 2018-06-12: qty 1

## 2018-06-12 MED ORDER — SODIUM CHLORIDE 0.9 % IV SOLN: INTRAVENOUS | Status: DC

## 2018-06-12 MED ORDER — LIDOCAINE 2% (20 MG/ML) 5 ML SYRINGE
INTRAMUSCULAR | Status: DC | PRN
  Administered 2018-06-12: 75 mg via INTRAVENOUS

## 2018-06-12 MED ORDER — CIPROFLOXACIN IN D5W 400 MG/200ML IV SOLN
INTRAVENOUS | Status: AC
  Filled 2018-06-12: qty 200

## 2018-06-12 MED ORDER — CIPROFLOXACIN IN D5W 400 MG/200ML IV SOLN
INTRAVENOUS | Status: DC | PRN
  Administered 2018-06-12: 400 mg via INTRAVENOUS

## 2018-06-12 MED ORDER — PROPOFOL 10 MG/ML IV BOLUS
INTRAVENOUS | Status: DC | PRN
  Administered 2018-06-12: 150 mg via INTRAVENOUS

## 2018-06-12 MED ORDER — ONDANSETRON HCL 4 MG/2ML IJ SOLN
INTRAMUSCULAR | Status: DC | PRN
  Administered 2018-06-12: 4 mg via INTRAVENOUS

## 2018-06-12 MED ORDER — FENTANYL CITRATE (PF) 100 MCG/2ML IJ SOLN
INTRAMUSCULAR | Status: AC
  Filled 2018-06-12: qty 2

## 2018-06-12 MED ORDER — FENTANYL CITRATE (PF) 100 MCG/2ML IJ SOLN
INTRAMUSCULAR | Status: DC | PRN
  Administered 2018-06-12: 50 ug via INTRAVENOUS

## 2018-06-12 MED ORDER — INDOMETHACIN 50 MG RE SUPP
RECTAL | Status: AC
  Filled 2018-06-12: qty 2

## 2018-06-12 MED ORDER — PROPOFOL 10 MG/ML IV BOLUS
INTRAVENOUS | Status: AC
  Filled 2018-06-12: qty 20

## 2018-06-12 MED ORDER — LACTATED RINGERS IV SOLN
INTRAVENOUS | Status: DC
  Administered 2018-06-12: 1000 mL via INTRAVENOUS

## 2018-06-12 MED ORDER — EPHEDRINE SULFATE-NACL 50-0.9 MG/10ML-% IV SOSY
PREFILLED_SYRINGE | INTRAVENOUS | Status: DC | PRN
  Administered 2018-06-12: 10 mg via INTRAVENOUS

## 2018-06-12 MED ORDER — SODIUM CHLORIDE 0.9 % IV SOLN
INTRAVENOUS | Status: DC | PRN
  Administered 2018-06-12: 100 mL

## 2018-06-12 MED ORDER — SCOPOLAMINE 1 MG/3DAYS TD PT72
MEDICATED_PATCH | TRANSDERMAL | Status: AC
  Filled 2018-06-12: qty 1

## 2018-06-12 MED ORDER — SCOPOLAMINE 1 MG/3DAYS TD PT72
1.0000 | MEDICATED_PATCH | TRANSDERMAL | Status: DC
  Administered 2018-06-12: 1.5 mg via TRANSDERMAL

## 2018-06-12 MED ORDER — DEXAMETHASONE SODIUM PHOSPHATE 10 MG/ML IJ SOLN
INTRAMUSCULAR | Status: DC | PRN
  Administered 2018-06-12: 10 mg via INTRAVENOUS

## 2018-06-12 MED ORDER — SUCCINYLCHOLINE CHLORIDE 200 MG/10ML IV SOSY
PREFILLED_SYRINGE | INTRAVENOUS | Status: DC | PRN
  Administered 2018-06-12: 100 mg via INTRAVENOUS

## 2018-06-12 NOTE — Discharge Instructions (Signed)
Endoscopic Retrograde Cholangiopancreatogram, Care After This sheet gives you information about how to care for yourself after your procedure. Your health care provider may also give you more specific instructions. If you have problems or questions, contact your health care provider. What can I expect after the procedure? After the procedure, it is common to have:  Soreness in your throat.  Nausea.  Bloating.  Dizziness.  Tiredness (fatigue).  Follow these instructions at home:  Take over-the-counter and prescription medicines only as told by your health care provider.  Do not drive for 24 hours if you were given a medicine to help you relax (sedative) during your procedure. Have someone stay with you for 24 hours after the procedure.  Return to your normal activities as told by your health care provider. Ask your health care provider what activities are safe for you.  Return to eating what you normally do as soon as you feel well enough or as told by your health care provider.  Keep all follow-up visits as told by your health care provider. This is important. Contact a health care provider if:  You have pain in your abdomen that does not get better with medicine.  You develop signs of infection, such as: ? Chills. ? Feeling unwell. Get help right away if:  You have difficulty swallowing.  You have worsening pain in your throat, chest, or abdomen.  You vomit bright red blood or a substance that looks like coffee grounds.  You have bloody or very black stools.  You have a fever.  You have a sudden increase in swelling (bloating) in your abdomen. Summary  After the procedure, it is common to feel tired and to have some discomfort in your throat.  Contact your health care provider if you have signs of infection--such as chills or feeling unwell--or if you have pain that does not improve with medicine.  Get help right away if you have trouble swallowing, worsening  pain, bloody or black vomit, bloody or black stools, a fever, or increased swelling in your abdomen.  Keep all follow-up visits as told by your health care provider. This is important. This information is not intended to replace advice given to you by your health care provider. Make sure you discuss any questions you have with your health care provider. Document Released: 05/29/2013 Document Revised: 06/27/2016 Document Reviewed: 06/27/2016 Elsevier Interactive Patient Education  2017 [employer]. Call if question or problem otherwise follow-up in 1 month and clear liquids only until 3:30 PM and if doing well may have soft solids later today and if doing well may go back to work tomorrow

## 2018-06-12 NOTE — Progress Notes (Signed)
Jasmine Bradford 10:50 AM  Subjective: Patient doing a little better since she was recently seen in the office with less abdominal pain and nausea but some fullness in her throat but no real reflux her case discussed with her significant other as well  Objective: Vital signs stable afebrile no acute distress exam please see previous assessment evaluation  Assessment: Questionable reflux and current CBD stones  Plan: Okay to proceed with endoscopy and ERCP with anesthesia assistance  [facility] E  Pager [phone] After 5PM or if no answer call [phone]

## 2018-06-12 NOTE — Transfer of Care (Signed)
Immediate Anesthesia Transfer of Care Note  Patient: Jasmine Bradford  Procedure(s) Performed: ENDOSCOPIC RETROGRADE CHOLANGIOPANCREATOGRAPHY (ERCP) (N/A ) ESOPHAGOGASTRODUODENOSCOPY (EGD) (N/A ) SPHINCTEROTOMY REMOVAL OF STONES BILIARY DILATION  Patient Location: PACU  Anesthesia Type:General  Level of Consciousness: sedated  Airway & Oxygen Therapy: Patient Spontanous Breathing and Patient connected to face mask oxygen  Post-op Assessment: Report given to RN and Post -op Vital signs reviewed and stable  Post vital signs: Reviewed and stable  Last Vitals:  Vitals Value Taken Time  BP    Temp    Pulse 96 06/12/2018 11:54 AM  Resp    SpO2 100 % 06/12/2018 11:54 AM  Vitals shown include unvalidated device data.  Last Pain:  Vitals:   06/12/18 0934  TempSrc: Oral  PainSc: 0-No pain         Complications: No apparent anesthesia complications

## 2018-06-12 NOTE — Op Note (Signed)
[facility] Patient Name: Jasmine Bradford Procedure Date: 06/12/2018 MRN: [id number] Attending MD: Clarene Essex , MD Date of Birth: Aug 02, 1956 CSN: [id number] Age: 62 Admit Type: Outpatient Procedure:                ERCP Indications:              Abdominal pain of suspected biliary origin,                            Suspected bile duct stone(s), Elevated liver                            enzymes history of CBD stones Providers:                Clarene Essex, MD, Carlyn Reichert, RN, Cletis Athens,                            Technician, Esmont Alday CRNA, CRNA Referring MD:              Medicines:                General Anesthesia Complications:            No immediate complications. Estimated Blood Loss:     Estimated blood loss: none. Procedure:                Pre-Anesthesia Assessment:                           - Prior to the procedure, a History and Physical                            was performed, and patient medications and                            allergies were reviewed. The patient's tolerance of                            previous anesthesia was also reviewed. The risks                            and benefits of the procedure and the sedation                            options and risks were discussed with the patient.                            All questions were answered, and informed consent                            was obtained. Prior Anticoagulants: The patient has                            taken no previous anticoagulant or antiplatelet  agents. ASA Grade Assessment: II - A patient with                            mild systemic disease. After reviewing the risks                            and benefits, the patient was deemed in                            satisfactory condition to undergo the procedure.                           After obtaining informed consent, the scope was                            passed under direct vision.  Throughout the                            procedure, the patient's blood pressure, pulse, and                            oxygen saturations were monitored continuously.                            After the endoscopy was completed the [id number]-Q180V                            ([id number]) Olympus ERCP was introduced through the                            mouth, and used to inject contrast into and used to                            cannulate the bile duct. The ERCP was accomplished                            without difficulty. The patient tolerated the                            procedure well. Scope In: Scope Out: Findings:      the major papilla was normal. Deep selective cannulation was readily       obtained using the sphincterotome and the JAG Jagwire and no obvious       stones were seen on initial cholangiogram but the proximal CBD was       mildly dilated and we proceeded with a biliary sphincterotomy which was       made with a Hydratome sphincterotome using ERBE electrocautery until we       had some biliary drainage and could get the fully bowed sphincterotome       easily in and out of the duct. There was no post-sphincterotomy       bleeding. There was no pancreatic duct wire advancement or diet       injection throughout the procedure and to discover objects, the biliary  tree was swept with an adjustable 12 -15 mm balloon starting at the       upper third of the main bile duct, bifurcation, left main hepatic duct       and right main hepatic duct. Nothing was found. Multiple balloon       pull-through's were done in the 12 mm balloon passed fairly readily       through the patent sphincterotomy site in both upper ducts were swept       with a 12 mm balloon as well and we did use the 15 mm balloon for the       CBD although we did have to lower it to 12 mm to pull it through the       sphincterotomy site. unfortunately probably due to edema the CBD and       intrahepatic's  were not draining very well so we proceeded with dilation       of the distal common bile duct and ampulla with a 4 cm by 8 mm balloon       dilator which was successful. There was much better drainage after       dilation and the balloon wire and scope were removed and the patient       tolerated the procedure well Impression:               - The major papilla appeared normal.                           - A biliary sphincterotomy was performed.                           - The biliary tree was swept and nothing was found.                           - Common bile duct was successfully dilated. Moderate Sedation:      N/A- Per Anesthesia Care Recommendation:           - Clear liquid diet for 4 hours.                           - Continue present medications.                           - Telephone GI clinic if symptomatic PRN.                           - Return to GI clinic in 4 weeks. Procedure Code(s):        --- Professional ---                           [phone], Endoscopic retrograde                            cholangiopancreatography (ERCP); with                            trans-endoscopic balloon dilation of  biliary/pancreatic duct(s) or of ampulla                            (sphincteroplasty), including sphincterotomy, when                            performed, each duct Diagnosis Code(s):        --- Professional ---                           R10.9, Unspecified abdominal pain                           R74.8, Abnormal levels of other serum enzymes CPT copyright 2018 American Medical Association. All rights reserved. The codes documented in this report are preliminary and upon coder review may  be revised to meet current compliance requirements. Clarene Essex, MD 06/12/2018 12:00:38 PM This report has been signed electronically. Number of Addenda: 0

## 2018-06-12 NOTE — Anesthesia Procedure Notes (Signed)
Procedure Name: Intubation Date/Time: 06/12/2018 11:03 AM Performed by: Lollie Sails, CRNA Pre-anesthesia Checklist: Patient identified, Emergency Drugs available, Suction available, Patient being monitored and Timeout performed Patient Re-evaluated:Patient Re-evaluated prior to induction Oxygen Delivery Method: Circle system utilized Preoxygenation: Pre-oxygenation with 100% oxygen Induction Type: IV induction Ventilation: Mask ventilation without difficulty Laryngoscope Size: 3 and Mac Grade View: Grade I Tube type: Oral Tube size: 7.0 mm Number of attempts: 1 Airway Equipment and Method: Stylet Placement Confirmation: ETT inserted through vocal cords under direct vision,  positive ETCO2 and breath sounds checked- equal and bilateral Secured at: 20 cm Tube secured with: Tape Dental Injury: Teeth and Oropharynx as per pre-operative assessment

## 2018-06-12 NOTE — Op Note (Signed)
[facility] Patient Name: Jasmine Bradford Procedure Date: 06/12/2018 MRN: [id number] Attending MD: Clarene Essex , MD Date of Birth: Jul 14, 1956 CSN: [id number] Age: 62 Admit Type: Outpatient Procedure:                Upper GI endoscopy Indications:              Epigastric abdominal pain, Suspected esophageal                            reflux, Nausea Providers:                Clarene Essex, MD, Carlyn Reichert, RN, Cletis Athens,                            Technician, Harle Stanford CRNA, CRNA Referring MD:              Medicines:                General Anesthesia Complications:            No immediate complications. Estimated Blood Loss:     Estimated blood loss: none. Procedure:                Pre-Anesthesia Assessment:                           - Prior to the procedure, a History and Physical                            was performed, and patient medications and                            allergies were reviewed. The patient's tolerance of                            previous anesthesia was also reviewed. The risks                            and benefits of the procedure and the sedation                            options and risks were discussed with the patient.                            All questions were answered, and informed consent                            was obtained. Prior Anticoagulants: The patient has                            taken no previous anticoagulant or antiplatelet                            agents. ASA Grade Assessment: II - A patient with  mild systemic disease. After reviewing the risks                            and benefits, the patient was deemed in                            satisfactory condition to undergo the procedure.                           After obtaining informed consent, the endoscope was                            passed under direct vision. Throughout the                            procedure, the patient's  blood pressure, pulse, and                            oxygen saturations were monitored continuously. The                            [id number]-H190 ([id number]) Olympus adult endoscope was                            introduced through the mouth, and advanced to the                            third part of duodenum. The upper GI endoscopy was                            accomplished without difficulty. The patient                            tolerated the procedure well. Scope In: Scope Out: Findings:      The examined esophagus was normal.      The entire examined stomach was normal.      The duodenal bulb, first portion of the duodenum, second portion of the       duodenum and third portion of the duodenum were normal.      The exam was otherwise without abnormality. Impression:               - Normal esophagus.                           - Normal stomach.                           - Normal duodenal bulb, first portion of the                            duodenum, second portion of the duodenum and third                            portion of the duodenum.                           -  The examination was otherwise normal.                           - No specimens collected. Moderate Sedation:      N/A- Per Anesthesia Care Recommendation:           - Patient has a contact number available for                            emergencies. The signs and symptoms of potential                            delayed complications were discussed with the                            patient. Return to normal activities tomorrow.                            Written discharge instructions were provided to the                            patient.                           - Clear liquid diet for 4 hours.                           - Continue present medications.                           - Return to GI clinic in 4 weeks.                           - Telephone GI clinic if symptomatic PRN.                           - Perform  an ERCP today. Procedure Code(s):        --- Professional ---                           [phone], Esophagogastroduodenoscopy, flexible,                            transoral; diagnostic, including collection of                            specimen(s) by brushing or washing, when performed                            (separate procedure) Diagnosis Code(s):        --- Professional ---                           R10.13, Epigastric pain                           R11.0, Nausea CPT copyright 2018 American Medical Association.  All rights reserved. The codes documented in this report are preliminary and upon coder review may  be revised to meet current compliance requirements. Clarene Essex, MD 06/12/2018 11:52:43 AM This report has been signed electronically. Number of Addenda: 0

## 2018-06-12 NOTE — Anesthesia Postprocedure Evaluation (Signed)
Anesthesia Post Note  Patient: Jasmine Bradford  Procedure(s) Performed: ENDOSCOPIC RETROGRADE CHOLANGIOPANCREATOGRAPHY (ERCP) (N/A ) ESOPHAGOGASTRODUODENOSCOPY (EGD) (N/A ) SPHINCTEROTOMY REMOVAL OF STONES BILIARY DILATION     Patient location during evaluation: Endoscopy Anesthesia Type: General Level of consciousness: awake and alert Pain management: pain level controlled Vital Signs Assessment: post-procedure vital signs reviewed and stable Respiratory status: spontaneous breathing, nonlabored ventilation, respiratory function stable and patient connected to nasal cannula oxygen Cardiovascular status: blood pressure returned to baseline and stable Postop Assessment: no apparent nausea or vomiting Anesthetic complications: no    Last Vitals:  Vitals:   06/12/18 1220 06/12/18 1230  BP: 115/67 112/62  Pulse: 70 65  Resp: 15 15  Temp:    SpO2: 97% 100%    Last Pain:  Vitals:   06/12/18 1210  TempSrc:   PainSc: 0-No pain                 Chelsey L Woodrum

## 2018-06-12 NOTE — Anesthesia Preprocedure Evaluation (Addendum)
Anesthesia Evaluation  Patient identified by MRN, date of birth, ID band Patient awake    Reviewed: Allergy & Precautions, NPO status , Patient's Chart, lab work & pertinent test results  Airway Mallampati: III  TM Distance: >3 FB Neck ROM: Full    Dental no notable dental hx. (+) Chipped, Dental Advisory Given,    Pulmonary former smoker,    Pulmonary exam normal breath sounds clear to auscultation       Cardiovascular negative cardio ROS Normal cardiovascular exam Rhythm:Regular Rate:Normal     Neuro/Psych negative neurological ROS  negative psych ROS   GI/Hepatic negative GI ROS, Neg liver ROS,   Endo/Other  negative endocrine ROS  Renal/GU negative Renal ROS  negative genitourinary   Musculoskeletal negative musculoskeletal ROS (+)   Abdominal   Peds negative pediatric ROS (+)  Hematology negative hematology ROS (+)   Anesthesia Other Findings Biliary obstruction from stone, elevated LFTs  Reproductive/Obstetrics negative OB ROS                          Anesthesia Physical Anesthesia Plan  ASA: II  Anesthesia Plan: General   Post-op Pain Management:    Induction: Intravenous  PONV Risk Score and Plan: 3 and Treatment may vary due to age or medical condition, Ondansetron, Dexamethasone, Scopolamine patch - Pre-op and Midazolam  Airway Management Planned: Oral ETT  Additional Equipment:   Intra-op Plan:   Post-operative Plan: Extubation in OR  Informed Consent: I have reviewed the patients History and Physical, chart, labs and discussed the procedure including the risks, benefits and alternatives for the proposed anesthesia with the patient or authorized representative who has indicated his/her understanding and acceptance.   Dental advisory given  Plan Discussed with: CRNA  Anesthesia Plan Comments:        Anesthesia Quick Evaluation

## 2018-06-13 ENCOUNTER — Encounter (HOSPITAL_COMMUNITY): Payer: Self-pay | Admitting: Gastroenterology

## 2018-06-19 ENCOUNTER — Ambulatory Visit: Payer: 59 | Admitting: Gastroenterology

## 2019-04-04 DIAGNOSIS — I8312 Varicose veins of left lower extremity with inflammation: Secondary | ICD-10-CM | POA: Diagnosis not present

## 2019-04-04 DIAGNOSIS — I83813 Varicose veins of bilateral lower extremities with pain: Secondary | ICD-10-CM | POA: Diagnosis not present

## 2019-04-04 DIAGNOSIS — I8311 Varicose veins of right lower extremity with inflammation: Secondary | ICD-10-CM | POA: Diagnosis not present

## 2019-05-13 DIAGNOSIS — I83811 Varicose veins of right lower extremities with pain: Secondary | ICD-10-CM | POA: Diagnosis not present

## 2019-05-13 DIAGNOSIS — I8311 Varicose veins of right lower extremity with inflammation: Secondary | ICD-10-CM | POA: Diagnosis not present

## 2019-05-15 DIAGNOSIS — I8311 Varicose veins of right lower extremity with inflammation: Secondary | ICD-10-CM | POA: Diagnosis not present

## 2019-05-29 DIAGNOSIS — I8311 Varicose veins of right lower extremity with inflammation: Secondary | ICD-10-CM | POA: Diagnosis not present

## 2019-05-29 DIAGNOSIS — I83811 Varicose veins of right lower extremities with pain: Secondary | ICD-10-CM | POA: Diagnosis not present

## 2019-06-10 DIAGNOSIS — I8311 Varicose veins of right lower extremity with inflammation: Secondary | ICD-10-CM | POA: Diagnosis not present

## 2019-06-10 DIAGNOSIS — M7981 Nontraumatic hematoma of soft tissue: Secondary | ICD-10-CM | POA: Diagnosis not present

## 2019-06-10 DIAGNOSIS — I83811 Varicose veins of right lower extremities with pain: Secondary | ICD-10-CM | POA: Diagnosis not present

## 2019-06-24 DIAGNOSIS — I8311 Varicose veins of right lower extremity with inflammation: Secondary | ICD-10-CM | POA: Diagnosis not present

## 2019-06-24 DIAGNOSIS — M7981 Nontraumatic hematoma of soft tissue: Secondary | ICD-10-CM | POA: Diagnosis not present

## 2019-07-15 DIAGNOSIS — M7981 Nontraumatic hematoma of soft tissue: Secondary | ICD-10-CM | POA: Diagnosis not present

## 2019-07-15 DIAGNOSIS — I8312 Varicose veins of left lower extremity with inflammation: Secondary | ICD-10-CM | POA: Diagnosis not present

## 2019-07-15 DIAGNOSIS — I83812 Varicose veins of left lower extremities with pain: Secondary | ICD-10-CM | POA: Diagnosis not present

## 2019-07-30 DIAGNOSIS — M7981 Nontraumatic hematoma of soft tissue: Secondary | ICD-10-CM | POA: Diagnosis not present

## 2019-07-30 DIAGNOSIS — I8312 Varicose veins of left lower extremity with inflammation: Secondary | ICD-10-CM | POA: Diagnosis not present

## 2019-07-30 DIAGNOSIS — I83812 Varicose veins of left lower extremities with pain: Secondary | ICD-10-CM | POA: Diagnosis not present

## 2019-08-08 ENCOUNTER — Other Ambulatory Visit: Payer: Self-pay

## 2019-08-09 ENCOUNTER — Ambulatory Visit (INDEPENDENT_AMBULATORY_CARE_PROVIDER_SITE_OTHER): Payer: 59 | Admitting: Family Medicine

## 2019-08-09 ENCOUNTER — Encounter: Payer: Self-pay | Admitting: Family Medicine

## 2019-08-09 VITALS — BP 110/70 | Temp 98.0°F | Ht 62.0 in | Wt 126.8 lb

## 2019-08-09 DIAGNOSIS — G4700 Insomnia, unspecified: Secondary | ICD-10-CM

## 2019-08-09 DIAGNOSIS — Z1231 Encounter for screening mammogram for malignant neoplasm of breast: Secondary | ICD-10-CM

## 2019-08-09 DIAGNOSIS — R12 Heartburn: Secondary | ICD-10-CM | POA: Diagnosis not present

## 2019-08-09 DIAGNOSIS — R1013 Epigastric pain: Secondary | ICD-10-CM | POA: Diagnosis not present

## 2019-08-09 DIAGNOSIS — R0989 Other specified symptoms and signs involving the circulatory and respiratory systems: Secondary | ICD-10-CM | POA: Diagnosis not present

## 2019-08-09 DIAGNOSIS — R198 Other specified symptoms and signs involving the digestive system and abdomen: Secondary | ICD-10-CM

## 2019-08-09 MED ORDER — OMEPRAZOLE 40 MG PO CPDR: 40.0000 mg | DELAYED_RELEASE_CAPSULE | Freq: Every day | ORAL | 3 refills | Status: DC

## 2019-08-09 NOTE — Progress Notes (Signed)
Jasmine Bradford is a 63 y.o. female  Chief Complaint  Patient presents with  . Establish Care    Pt c/o GI issues and would like to discuss a referral for mammogram and perhaps a GI referral.     HPI: Jasmine Bradford is a 63 y.o. female here to establish care with our office. She is overdue for mammo and needs referral.  She had GB removed in 05/2016 due to chest pain with radiation to back. Was told she had gallstones and these were removed, then she had GB removed, then states "they missed a gallstone" and it had to be removed.   She complains of intermittent soreness in epigastric region, globus sensation in throat, and rarely radiation to back.  No change in appetite. Diet is low fat. No d/c. No blood in stool. No melena.  She is taking pepcid PRN.   Dr. Watt Climes - GI Dr. Shelda Jakes - surgeon  She last had EGD and ERCP in 05/2018.  Colonoscopy - 09/2018  Pt also reports difficulty staying asleep. This is not a new issue. She is able to fall asleep, but then wakes up after 3-4 hours and is awake for 1+ hrs. She tried melatonin a few times in the remote past.   Past Medical History:  Diagnosis Date  . Cholelithiasis with choledocholithiasis   . Tobacco abuse   . Varicose vein of leg    no problems now  . Wears glasses     Past Surgical History:  Procedure Laterality Date  . CESAREAN SECTION    . CHOLECYSTECTOMY N/A 05/23/2016   Procedure: LAPAROSCOPIC CHOLECYSTECTOMY WITH INTRAOPERATIVE CHOLANGIOGRAM;  Surgeon: Jackolyn Confer, MD;  Location: [facility];  Service: General;  Laterality: N/A;  . ENDOSCOPIC RETROGRADE CHOLANGIOPANCREATOGRAPHY (ERCP) WITH PROPOFOL N/A 06/06/2016   Procedure: ENDOSCOPIC RETROGRADE CHOLANGIOPANCREATOGRAPHY (ERCP) WITH PROPOFOL;  Surgeon: Clarene Essex, MD;  Location[facility] ENDOSCOPY;  Service: Endoscopy;  Laterality: N/A;  . ERCP N/A 03/09/2016   Procedure: ENDOSCOPIC RETROGRADE CHOLANGIOPANCREATOGRAPHY (ERCP);  Surgeon: Clarene Essex, MD;   Location: [facility] ENDOSCOPY;  Service: Endoscopy;  Laterality: N/A;  . ERCP N/A 06/12/2018   Procedure: ENDOSCOPIC RETROGRADE CHOLANGIOPANCREATOGRAPHY (ERCP);  Surgeon: Clarene Essex, MD;  Location: [facility] ENDOSCOPY;  Service: Endoscopy;  Laterality: N/A;  . ESOPHAGOGASTRODUODENOSCOPY N/A 06/12/2018   Procedure: ESOPHAGOGASTRODUODENOSCOPY (EGD);  Surgeon: Clarene Essex, MD;  Location: [facility] ENDOSCOPY;  Service: Endoscopy;  Laterality: N/A;  . laser vein surgery     last year  . REMOVAL OF STONES  06/12/2018   Procedure: REMOVAL OF STONES;  Surgeon: Clarene Essex, MD;  Location: [facility] ENDOSCOPY;  Service: Endoscopy;;  . Joan Mayans  06/12/2018   Procedure: Joan Mayans;  Surgeon: Clarene Essex, MD;  Location: [facility] ENDOSCOPY;  Service: Endoscopy;;  . TUBAL LIGATION    . VARICOSE VEIN SURGERY Left     Social History   Socioeconomic History  . Marital status: Married    Spouse name: Not on file  . Number of children: Not on file  . Years of education: Not on file  . Highest education level: Not on file  Occupational History  . Not on file  Tobacco Use  . Smoking status: Former Smoker    Packs/day: 0.50    Years: 35.00    Pack years: 17.50    Types: Cigarettes  . Smokeless tobacco: Never Used  . Tobacco comment: No smoing since 02/2016- "wearing Nicotine patch"  Substance and Sexual Activity  . Alcohol use: No    Alcohol/week: 0.0 standard drinks  .  Drug use: No  . Sexual activity: Not on file  Other Topics Concern  . Not on file  Social History Narrative  . Not on file   Social Determinants of Health   Financial Resource Strain:   . Difficulty of Paying Living Expenses: Not on file  Food Insecurity:   . Worried About Charity fundraiser in the Last Year: Not on file  . Ran Out of Food in the Last Year: Not on file  Transportation Needs:   . Lack of Transportation (Medical): Not on file  . Lack of Transportation (Non-Medical): Not on file  Physical Activity:   . Days of Exercise per Week:  Not on file  . Minutes of Exercise per Session: Not on file  Stress:   . Feeling of Stress : Not on file  Social Connections:   . Frequency of Communication with Friends and Family: Not on file  . Frequency of Social Gatherings with Friends and Family: Not on file  . Attends Religious Services: Not on file  . Active Member of Clubs or Organizations: Not on file  . Attends Archivist Meetings: Not on file  . Marital Status: Not on file  Intimate Partner Violence:   . Fear of Current or Ex-Partner: Not on file  . Emotionally Abused: Not on file  . Physically Abused: Not on file  . Sexually Abused: Not on file    Family History  Problem Relation Age of Onset  . Memory loss Mother      Immunization History  Administered Date(s) Administered  . Influenza, Quadrivalent, Recombinant, Inj, Pf 06/09/2019  . Influenza,inj,Quad PF,6+ Mos 06/07/2016  . Pneumococcal Conjugate-13 06/09/2019  . Tdap 02/10/2015    Outpatient Encounter Medications as of 08/09/2019  Medication Sig  . Carboxymethylcellulose Sodium (THERATEARS) 0.25 % SOLN Place 1 drop into both eyes daily.  . cetirizine (ZYRTEC) 10 MG tablet Take 10 mg by mouth daily as needed for allergies.  . Multiple Vitamin (MULTIVITAMIN WITH MINERALS) TABS tablet Take 1 tablet by mouth daily with lunch.  Marland Kitchen omeprazole (PRILOSEC) 40 MG capsule Take 1 capsule (40 mg total) by mouth daily.  . [DISCONTINUED] omeprazole (PRILOSEC) 20 MG capsule Take 20 mg by mouth daily before breakfast.   No facility-administered encounter medications on file as of 08/09/2019.     ROS: Pertinent positives and negatives noted in HPI. Remainder of ROS non-contributory   Allergies  Allergen Reactions  . Oxycodone Nausea And Vomiting and Other (See Comments)    Also made patient lightheaded and lethargic    BP 110/70 (BP Location: Left Arm, Patient Position: Sitting, Cuff Size: Normal)   Temp 98 F (36.7 C) (Temporal)   Ht 5\' 2"  (1.575 m)    Wt 126 lb 12.8 oz (57.5 kg)   BMI 23.19 kg/m   Physical Exam  Constitutional: She is oriented to person, place, and time. She appears well-developed and well-nourished. No distress.  Pulmonary/Chest: Effort normal. She exhibits no tenderness.  Abdominal: Soft. Bowel sounds are normal. She exhibits no distension and no mass. There is abdominal tenderness (epigastric TTP). There is no rebound and no guarding.  Neurological: She is alert and oriented to person, place, and time.  Skin: Skin is warm and dry.  Psychiatric: She has a normal mood and affect. Her behavior is normal.     A/P:  1. Encounter for screening mammogram for malignant neoplasm of breast - [facility] DIGITAL SCREENING BILATERAL; Future  2. Epigastric abdominal pain 3. Heartburn 4.  Globus sensation - previously seen at [facility] Dr. Watt Climes and sx attributed to gallbladder, but pt is s/p chole and still with symptoms - Ambulatory referral to Gastroenterology Rx: - omeprazole (PRILOSEC) 40 MG capsule; Take 1 capsule (40 mg total) by mouth daily.  Dispense: 30 capsule; Refill: 3 - diet modification to avoid trigger foods  5. Insomnia, unspecified type - trial of 5mg  delayed release melatonin qHS x 2-4 wks   Pt will schedule CPE with fasting lab appt for late Jan 2021  This visit occurred during the SARS-CoV-2 public health emergency.  Safety protocols were in place, including screening questions prior to the visit, additional usage of staff PPE, and extensive cleaning of exam room while observing appropriate contact time as indicated for disinfecting solutions.

## 2019-08-12 ENCOUNTER — Encounter: Payer: Self-pay | Admitting: Gastroenterology

## 2019-08-13 DIAGNOSIS — M7981 Nontraumatic hematoma of soft tissue: Secondary | ICD-10-CM | POA: Diagnosis not present

## 2019-08-13 DIAGNOSIS — I8312 Varicose veins of left lower extremity with inflammation: Secondary | ICD-10-CM | POA: Diagnosis not present

## 2019-08-19 ENCOUNTER — Ambulatory Visit: Payer: 59 | Admitting: Family Medicine

## 2019-09-20 ENCOUNTER — Other Ambulatory Visit: Payer: Self-pay

## 2019-09-20 ENCOUNTER — Ambulatory Visit: Payer: Medicare HMO | Admitting: Gastroenterology

## 2019-09-20 ENCOUNTER — Encounter: Payer: Self-pay | Admitting: Gastroenterology

## 2019-09-20 VITALS — BP 114/62 | HR 76 | Temp 97.4°F | Ht 62.0 in | Wt 129.1 lb

## 2019-09-20 DIAGNOSIS — Z86010 Personal history of colonic polyps: Secondary | ICD-10-CM

## 2019-09-20 DIAGNOSIS — R0989 Other specified symptoms and signs involving the circulatory and respiratory systems: Secondary | ICD-10-CM

## 2019-09-20 DIAGNOSIS — K219 Gastro-esophageal reflux disease without esophagitis: Secondary | ICD-10-CM | POA: Diagnosis not present

## 2019-09-20 DIAGNOSIS — Z8719 Personal history of other diseases of the digestive system: Secondary | ICD-10-CM | POA: Diagnosis not present

## 2019-09-20 DIAGNOSIS — R1013 Epigastric pain: Secondary | ICD-10-CM | POA: Diagnosis not present

## 2019-09-20 NOTE — Progress Notes (Signed)
Chief Complaint: GERD, abdominal pain, history of gallstones, history of colon polyps  Referring Provider:     Ronnald Nian, DO   HPI:    Jasmine Bradford is a 64 y.o. female referred to the [facility] for evaluation of reflux symptoms, abdominal pain.  She c/o epigastric pain. Occurs intermittently, can radiate around to back with associated n/v. Feels like prior GB sxs. Has maintained low fat diet since [facility].   Separately with globus sensation. Worse at night/supine.  Denies heartburn or regurgitation.  Otherwise tolerating p.o. intake without dysphagia.  Started on Prilosec 40 mg/day last month with resolution of abdominal pain and near resolution of globus sensation.  Previously followed by Dr. Watt Climes in [place].  History of cholelithiasis s/p ccy with IOC in 05/2016 with subsequent ERCP in 05/2018.  Requesting records from [place] today.   Endoscopic history: -ERCP (02/2016, Dr. Watt Climes, [place]): Choledocholithiasis, removed with sphincterotomy and balloon sweeps -ERCP (05/2016, Dr. Watt Climes): Choledocholithiasis, removed with further sphincterotomy and balloon sweeps with removal of stones and sludge -EGD (05/2018, Dr. Watt Climes): Normal -ERCP (05/2018, Dr. Watt Climes): No stones on cholangiogram.  Balloon sweeps (nothing removed) and dilation of distal CBD/ampulla with 4 cm x 8 mm balloon dilator to facilitate improved drainage -Colonoscopy (09/2018, Dr. Watt Climes): External/internal hemorrhoids, sigmoid diverticulosis, 7 polyps resected (no path review), normal TI.  Recommended repeat in 3 years  -Abdominal ultrasound (05/2018): Surgically absent GB, CBD 10.1 mm tapering to 7.1 mm.  Increased hepatic echotexture  No new labs since 05/2016.   Past Medical History:  Diagnosis Date  . Cholelithiasis with choledocholithiasis   . Diverticulitis   . Tobacco abuse   . Varicose vein of leg    no problems now  . Wears glasses      Past Surgical History:   Procedure Laterality Date  . CESAREAN SECTION    . CHOLECYSTECTOMY N/A 05/23/2016   Procedure: LAPAROSCOPIC CHOLECYSTECTOMY WITH INTRAOPERATIVE CHOLANGIOGRAM;  Surgeon: Jackolyn Confer, MD;  Location: [facility];  Service: General;  Laterality: N/A;  . ENDOSCOPIC RETROGRADE CHOLANGIOPANCREATOGRAPHY (ERCP) WITH PROPOFOL N/A 06/06/2016   Procedure: ENDOSCOPIC RETROGRADE CHOLANGIOPANCREATOGRAPHY (ERCP) WITH PROPOFOL;  Surgeon: Clarene Essex, MD;  Location: [facility] ENDOSCOPY;  Service: Endoscopy;  Laterality: N/A;  . ERCP N/A 03/09/2016   Procedure: ENDOSCOPIC RETROGRADE CHOLANGIOPANCREATOGRAPHY (ERCP);  Surgeon: Clarene Essex, MD;  Location: [facility] ENDOSCOPY;  Service: Endoscopy;  Laterality: N/A;  . ERCP N/A 06/12/2018   Procedure: ENDOSCOPIC RETROGRADE CHOLANGIOPANCREATOGRAPHY (ERCP);  Surgeon: Clarene Essex, MD;  Location: [facility] ENDOSCOPY;  Service: Endoscopy;  Laterality: N/A;  . ESOPHAGOGASTRODUODENOSCOPY N/A 06/12/2018   Procedure: ESOPHAGOGASTRODUODENOSCOPY (EGD);  Surgeon: Clarene Essex, MD;  Location: [facility] ENDOSCOPY;  Service: Endoscopy;  Laterality: N/A;  . laser vein surgery     last year  . REMOVAL OF STONES  06/12/2018   Procedure: REMOVAL OF STONES;  Surgeon: Clarene Essex, MD;  Location: [facility] ENDOSCOPY;  Service: Endoscopy;;  . Joan Mayans  06/12/2018   Procedure: Joan Mayans;  Surgeon: Clarene Essex, MD;  Location: [facility] ENDOSCOPY;  Service: Endoscopy;;  . TUBAL LIGATION    . VARICOSE VEIN SURGERY Left    Family History  Problem Relation Age of Onset  . Memory loss Mother   . Ovarian cancer Mother   . Colon polyps Neg Hx   . Colon cancer Neg Hx    Social History   Tobacco Use  . Smoking status: Former Smoker    Packs/day: 0.50  Years: 35.00    Pack years: 17.50    Types: Cigarettes  . Smokeless tobacco: Never Used  . Tobacco comment: No smoing since 02/2016- "wearing Nicotine patch"  Substance Use Topics  . Alcohol use: No    Alcohol/week: 0.0 standard drinks  . Drug use: No   Current  Outpatient Medications  Medication Sig Dispense Refill  . Carboxymethylcellulose Sodium (THERATEARS) 0.25 % SOLN Place 1 drop into both eyes daily.    . cetirizine (ZYRTEC) 10 MG tablet Take 10 mg by mouth daily as needed for allergies.    . Multiple Vitamin (MULTIVITAMIN WITH MINERALS) TABS tablet Take 1 tablet by mouth daily with lunch.    Marland Kitchen omeprazole (PRILOSEC) 40 MG capsule Take 1 capsule (40 mg total) by mouth daily. 30 capsule 3   No current facility-administered medications for this visit.   Allergies  Allergen Reactions  . Oxycodone Nausea And Vomiting and Other (See Comments)    Also made patient lightheaded and lethargic     Review of Systems: All systems reviewed and negative except where noted in HPI.     Physical Exam:    Wt Readings from Last 3 Encounters:  09/20/19 129 lb 2 oz (58.6 kg)  08/09/19 126 lb 12.8 oz (57.5 kg)  06/12/18 125 lb (56.7 kg)    BP 114/62   Pulse 76   Temp (!) 97.4 F (36.3 C)   Ht 5\' 2"  (1.575 m)   Wt 129 lb 2 oz (58.6 kg)   BMI 23.62 kg/m  Constitutional:  Pleasant, in no acute distress. Psychiatric: Normal mood and affect. Behavior is normal. EENT: Pupils normal.  Conjunctivae are normal. No scleral icterus. Neck supple. No cervical LAD. Cardiovascular: Normal rate, regular rhythm. No edema Pulmonary/chest: Effort normal and breath sounds normal. No wheezing, rales or rhonchi. Abdominal: Soft, nondistended, nontender. Bowel sounds active throughout. There are no masses palpable. No hepatomegaly. Neurological: Alert and oriented to person place and time. Skin: Skin is warm and dry. No rashes noted.   ASSESSMENT AND PLAN;   1) Epigastric pain 2) Globus sensation 3) GERD Clinical presentation seems most consistent with GERD, with robust clinical response to appropriate trial of PPI therapy.  Discussed the role of repeat endoscopy, but given good clinical response, plan for medical management alone at this time.  -Resume  Prilosec 40 mg/day for 2 months, then can trial reducing to 20 mg/day in an effort to titrate to lowest effective dose -Query bile acid reflux.  Depending on clinical response, could trial cholestyramine in the future -She and I discussed the role of repeat endoscopy at length today.  Given good clinical response to PPI trial, holding off on repeat endoscopy  5) History of cholelithiasis s/p ccy  6) History of colon polyps: -Colonoscopy in 2020 with 7 polyps, with recommendation repeat in 3 years -Repeat colonoscopy in 2023 for ongoing polyp surveillance  I personally reviewed each of her prior endoscopy reports and imaging studies at length today, with full review of findings with the patient.  I spent 45 minutes of time, including in depth chart review, independent review of results as outlined above, communicating results with the patient directly, face-to-face time with the patient, coordinating care, ordering studies and medications as appropriate, and documentation.     Lavena Bullion, DO, FACG  09/20/2019, 3:35 PM   Loree Shehata, Garvin Fila, DO

## 2019-09-20 NOTE — Patient Instructions (Signed)
If you are age 66 or older, your body mass index should be between 23-30. Your Body mass index is 23.62 kg/m. If this is out of the aforementioned range listed, please consider follow up with your Primary Care Provider.  If you are age 26 or younger, your body mass index should be between 19-25. Your Body mass index is 23.62 kg/m. If this is out of the aformentioned range listed, please consider follow up with your Primary Care Provider.   Follow up in 6 months with Dr Bryan Lemma  Due to recent changes in healthcare laws, you may see the results of your imaging and laboratory studies on MyChart before your provider has had a chance to review them.  We understand that in some cases there may be results that are confusing or concerning to you. Not all laboratory results come back in the same time frame and the provider may be waiting for multiple results in order to interpret others.  Please give [place] 48 hours in order for your provider to thoroughly review all the results before contacting the office for clarification of your results.

## 2019-09-27 ENCOUNTER — Ambulatory Visit: Payer: Medicare HMO

## 2019-10-11 ENCOUNTER — Encounter: Payer: Medicare HMO | Admitting: Family Medicine

## 2019-10-13 ENCOUNTER — Ambulatory Visit: Payer: Medicare HMO | Attending: Internal Medicine

## 2019-10-13 DIAGNOSIS — Z23 Encounter for immunization: Secondary | ICD-10-CM | POA: Insufficient documentation

## 2019-10-13 NOTE — Progress Notes (Signed)
   Covid-19 Vaccination Clinic  Name:  Jasmine Bradford    MRN: [id number] DOB: Jan 03, 1956  10/13/2019  Jasmine Bradford was observed post Covid-19 immunization for 15 minutes without incidence. She was provided with Vaccine Information Sheet and instruction to access the V-Safe system.   Jasmine Bradford was instructed to call 911 with any severe reactions post vaccine: Marland Kitchen Difficulty breathing  . Swelling of your face and throat  . A fast heartbeat  . A bad rash all over your body  . Dizziness and weakness    Immunizations Administered    Name Date Dose VIS Date Route   Pfizer COVID-19 Vaccine 10/13/2019  9:18 AM 0.3 mL 08/02/2019 Intramuscular   Manufacturer: [place]   Lot: [id number]   [place]: [id number]

## 2019-11-04 ENCOUNTER — Other Ambulatory Visit: Payer: Self-pay

## 2019-11-04 ENCOUNTER — Ambulatory Visit
Admission: RE | Admit: 2019-11-04 | Discharge: 2019-11-04 | Disposition: A | Discharge status: Home / Self Care | Payer: Medicare HMO | Source: Ambulatory Visit | Attending: Family Medicine | Admitting: Family Medicine

## 2019-11-04 ENCOUNTER — Ambulatory Visit: Payer: Medicare HMO | Attending: Internal Medicine

## 2019-11-04 DIAGNOSIS — Z23 Encounter for immunization: Secondary | ICD-10-CM

## 2019-11-04 DIAGNOSIS — Z1231 Encounter for screening mammogram for malignant neoplasm of breast: Secondary | ICD-10-CM | POA: Diagnosis not present

## 2019-11-04 NOTE — Progress Notes (Signed)
   Covid-19 Vaccination Clinic  Name:  Jasmine Bradford    MRN: [id number] DOB: [birth year]  11/04/2019  Ms. Bring was observed post Covid-19 immunization for 15 minutes without incident. She was provided with Vaccine Information Sheet and instruction to access the V-Safe system.   [name] was instructed to call 911 with any severe reactions post vaccine: Marland Kitchen Difficulty breathing  . Swelling of face and throat  . A fast heartbeat  . A bad rash all over body  . Dizziness and weakness   Immunizations Administered    Name Date Dose VIS Date Route   Pfizer COVID-19 Vaccine 11/04/2019 11:03 AM 0.3 mL 08/02/2019 Intramuscular   Manufacturer: [place]   Lot: [id number]   [place]: [id number]

## 2019-11-06 ENCOUNTER — Other Ambulatory Visit: Payer: Self-pay | Admitting: Family Medicine

## 2019-11-06 DIAGNOSIS — R928 Other abnormal and inconclusive findings on diagnostic imaging of breast: Secondary | ICD-10-CM

## 2019-11-07 ENCOUNTER — Other Ambulatory Visit: Payer: Self-pay

## 2019-11-07 NOTE — Patient Instructions (Signed)
Health Maintenance Due  Topic Date Due  . HIV Screening  Never done  . PAP SMEAR-Modifier  Never done  . DEXA SCAN  Never done    Depression screen [facility] 2/9 08/09/2019 02/10/2015  Decreased Interest 0 0  Down, Depressed, Hopeless 0 0  PHQ - 2 Score 0 0    Health Maintenance, Female Adopting a healthy lifestyle and getting preventive care are important in promoting health and wellness. Ask your health care provider about:  The right schedule for you to have regular tests and exams.  Things you can do on your own to prevent diseases and keep yourself healthy. What should I know about diet, weight, and exercise? Eat a healthy diet   Eat a diet that includes plenty of vegetables, fruits, low-fat dairy products, and lean protein.  Do not eat a lot of foods that are high in solid fats, added sugars, or sodium. Maintain a healthy weight Body mass index (BMI) is used to identify weight problems. It estimates body fat based on height and weight. Your health care provider can help determine your BMI and help you achieve or maintain a healthy weight. Get regular exercise Get regular exercise. This is one of the most important things you can do for your health. Most adults should:  Exercise for at least 150 minutes each week. The exercise should increase your heart rate and make you sweat (moderate-intensity exercise).  Do strengthening exercises at least twice a week. This is in addition to the moderate-intensity exercise.  Spend less time sitting. Even light physical activity can be beneficial. Watch cholesterol and blood lipids Have your blood tested for lipids and cholesterol at 66 years of age, then have this test every 5 years. Have your cholesterol levels checked more often if:  Your lipid or cholesterol levels are high.  You are older than 66 years of age.  You are at high risk for heart disease. What should I know about cancer screening? Depending on your health history and  family history, you may need to have cancer screening at various ages. This may include screening for:  Breast cancer.  Cervical cancer.  Colorectal cancer.  Skin cancer.  Lung cancer. What should I know about heart disease, diabetes, and high blood pressure? Blood pressure and heart disease  High blood pressure causes heart disease and increases the risk of stroke. This is more likely to develop in people who have high blood pressure readings, are of African descent, or are overweight.  Have your blood pressure checked: ? Every 3-5 years if you are 7-70 years of age. ? Every year if you are 57 years old or older. Diabetes Have regular diabetes screenings. This checks your fasting blood sugar level. Have the screening done:  Once every three years after age 21 if you are at a normal weight and have a low risk for diabetes.  More often and at a younger age if you are overweight or have a high risk for diabetes. What should I know about preventing infection? Hepatitis B If you have a higher risk for hepatitis B, you should be screened for this virus. Talk with your health care provider to find out if you are at risk for hepatitis B infection. Hepatitis C Testing is recommended for:  Everyone born from [birth year] through 1967.  Anyone with known risk factors for hepatitis C. Sexually transmitted infections (STIs)  Get screened for STIs, including gonorrhea and chlamydia, if: ? You are sexually active and are younger  than 66 years of age. ? You are older than 66 years of age and your health care provider tells you that you are at risk for this type of infection. ? Your sexual activity has changed since you were last screened, and you are at increased risk for chlamydia or gonorrhea. Ask your health care provider if you are at risk.  Ask your health care provider about whether you are at high risk for HIV. Your health care provider may recommend a prescription medicine to help prevent  HIV infection. If you choose to take medicine to prevent HIV, you should first get tested for HIV. You should then be tested every 3 months for as long as you are taking the medicine. Pregnancy  If you are about to stop having your period (premenopausal) and you may become pregnant, seek counseling before you get pregnant.  Take 400 to 800 micrograms (mcg) of folic acid every day if you become pregnant.  Ask for birth control (contraception) if you want to prevent pregnancy. Osteoporosis and menopause Osteoporosis is a disease in which the bones lose minerals and strength with aging. This can result in bone fractures. If you are 62 years old or older, or if you are at risk for osteoporosis and fractures, ask your health care provider if you should:  Be screened for bone loss.  Take a calcium or vitamin D supplement to lower your risk of fractures.  Be given hormone replacement therapy (HRT) to treat symptoms of menopause. Follow these instructions at home: Lifestyle  Do not use any products that contain nicotine or tobacco, such as cigarettes, e-cigarettes, and chewing tobacco. If you need help quitting, ask your health care provider.  Do not use street drugs.  Do not share needles.  Ask your health care provider for help if you need support or information about quitting drugs. Alcohol use  Do not drink alcohol if: ? Your health care provider tells you not to drink. ? You are pregnant, may be pregnant, or are planning to become pregnant.  If you drink alcohol: ? Limit how much you use to 0-1 drink a day. ? Limit intake if you are breastfeeding.  Be aware of how much alcohol is in your drink. In the [place] one drink equals one 12 oz bottle of beer (355 mL), one 5 oz glass of wine (148 mL), or one 1 oz glass of hard liquor (44 mL). General instructions  Schedule regular health, dental, and eye exams.  Stay current with your vaccines.  Tell your health care provider if: ? You  often feel depressed. ? You have ever been abused or do not feel safe at home. Summary  Adopting a healthy lifestyle and getting preventive care are important in promoting health and wellness.  Follow your health care provider's instructions about healthy diet, exercising, and getting tested or screened for diseases.  Follow your health care provider's instructions on monitoring your cholesterol and blood pressure. This information is not intended to replace advice given to you by your health care provider. Make sure you discuss any questions you have with your health care provider. Document Revised: 08/01/2018 Document Reviewed: 08/01/2018 Elsevier Patient Education  2020 [employer].

## 2019-11-08 ENCOUNTER — Encounter: Payer: Self-pay | Admitting: Family Medicine

## 2019-11-08 ENCOUNTER — Ambulatory Visit (INDEPENDENT_AMBULATORY_CARE_PROVIDER_SITE_OTHER): Payer: Medicare HMO | Admitting: Family Medicine

## 2019-11-08 VITALS — BP 110/70 | HR 64 | Temp 96.5°F | Ht 62.0 in | Wt 130.4 lb

## 2019-11-08 DIAGNOSIS — R7989 Other specified abnormal findings of blood chemistry: Secondary | ICD-10-CM

## 2019-11-08 DIAGNOSIS — R1013 Epigastric pain: Secondary | ICD-10-CM | POA: Diagnosis not present

## 2019-11-08 DIAGNOSIS — Z13220 Encounter for screening for lipoid disorders: Secondary | ICD-10-CM | POA: Diagnosis not present

## 2019-11-08 DIAGNOSIS — Z0000 Encounter for general adult medical examination without abnormal findings: Secondary | ICD-10-CM | POA: Diagnosis not present

## 2019-11-08 DIAGNOSIS — Z13820 Encounter for screening for osteoporosis: Secondary | ICD-10-CM | POA: Diagnosis not present

## 2019-11-08 DIAGNOSIS — R12 Heartburn: Secondary | ICD-10-CM

## 2019-11-08 LAB — COMPREHENSIVE METABOLIC PANEL
ALT: 21 U/L (ref 0–35)
AST: 20 U/L (ref 0–37)
Albumin: 4.1 g/dL (ref 3.5–5.2)
Alkaline Phosphatase: 80 U/L (ref 39–117)
BUN: 14 mg/dL (ref 6–23)
CO2: 30 mEq/L (ref 19–32)
Calcium: 9.3 mg/dL (ref 8.4–10.5)
Chloride: 103 mEq/L (ref 96–112)
Creatinine, Ser: 0.82 mg/dL (ref 0.40–1.20)
GFR: 69.84 mL/min (ref >60.00)
Glucose, Bld: 89 mg/dL (ref 70–99)
Potassium: 4.1 mEq/L (ref 3.5–5.1)
Sodium: 140 mEq/L (ref 135–145)
Total Bilirubin: 0.7 mg/dL (ref 0.2–1.2)
Total Protein: 6.8 g/dL (ref 6.0–8.3)

## 2019-11-08 LAB — LIPID PANEL
Cholesterol: 212 mg/dL — ABNORMAL HIGH (ref 0–200)
HDL: 54.9 mg/dL (ref >39.00)
LDL Cholesterol: 131 mg/dL — ABNORMAL HIGH (ref 0–99)
NonHDL: 156.74
Total CHOL/HDL Ratio: 4
Triglycerides: 127 mg/dL (ref 0.0–149.0)
VLDL: 25.4 mg/dL (ref 0.0–40.0)

## 2019-11-08 LAB — VITAMIN D 25 HYDROXY (VIT D DEFICIENCY, FRACTURES): VITD: 36.44 ng/mL (ref 30.00–100.00)

## 2019-11-08 MED ORDER — OMEPRAZOLE 40 MG PO CPDR: 40.0000 mg | DELAYED_RELEASE_CAPSULE | Freq: Every day | ORAL | 1 refills | Status: DC

## 2019-11-08 NOTE — Progress Notes (Signed)
Jasmine Bradford is a 64 y.o. female  Chief Complaint  Patient presents with  . Annual Exam    Pt is fasting for labs today.    HPI: Jasmine Bradford is a 64 y.o. female here for annual CPE, fasting labs as well as f/u on GERD w/ refill of omeprazole. She follows with Dr. Luanna Salk Davidjames Blansett [facility].  She states things are going "wonderfully". The omeprazole has made a big difference.   Last mammo: 10/2019 - has to return for additional imaging ([place]) Last Dexa: due - referral placed today but pt is having issue with insurance so will hold off on this Last colonoscopy: 09/2018  Dental: due - pt endorses poor dentition  Vision: UTD  Diet/Exercise: low fat diet, pt on her feet at work but no regular CV exercise  Med refills needed today? omeprazole  Past Medical History:  Diagnosis Date  . Cholelithiasis with choledocholithiasis   . Diverticulitis   . Tobacco abuse   . Varicose vein of leg    no problems now  . Wears glasses     Past Surgical History:  Procedure Laterality Date  . CESAREAN SECTION    . CHOLECYSTECTOMY N/A 05/23/2016   Procedure: LAPAROSCOPIC CHOLECYSTECTOMY WITH INTRAOPERATIVE CHOLANGIOGRAM;  Surgeon: Jackolyn Confer, MD;  Location: [place];  Service: General;  Laterality: N/A;  . ENDOSCOPIC RETROGRADE CHOLANGIOPANCREATOGRAPHY (ERCP) WITH PROPOFOL N/A 06/06/2016   Procedure: ENDOSCOPIC RETROGRADE CHOLANGIOPANCREATOGRAPHY (ERCP) WITH PROPOFOL;  Surgeon: Clarene Essex, MD;  Location: [facility] ENDOSCOPY;  Service: Endoscopy;  Laterality: N/A;  . ERCP N/A 03/09/2016   Procedure: ENDOSCOPIC RETROGRADE CHOLANGIOPANCREATOGRAPHY (ERCP);  Surgeon: Clarene Essex, MD;  Location: [facility]OSCOPY;  Service: Endoscopy;  Laterality: N/A;  . ERCP N/A 06/12/2018   Procedure: ENDOSCOPIC RETROGRADE CHOLANGIOPANCREATOGRAPHY (ERCP);  Surgeon: Clarene Essex, MD;  Location: [facility] ENDOSCOPY;  Service: Endoscopy;  Laterality: N/A;  . ESOPHAGOGASTRODUODENOSCOPY N/A 06/12/2018   Procedure:  ESOPHAGOGASTRODUODENOSCOPY (EGD);  Surgeon: Clarene Essex, MD;  Location: [facility] ENDOSCOPY;  Service: Endoscopy;  Laterality: N/A;  . laser vein surgery     last year  . REMOVAL OF STONES  06/12/2018   Procedure: REMOVAL OF STONES;  Surgeon: Clarene Essex, MD;  Location: [facility] ENDOSCOPY;  Service: Endoscopy;;  . Joan Mayans  06/12/2018   Procedure: Joan Mayans;  Surgeon: Clarene Essex, MD;  Location: [facility] ENDOSCOPY;  Service: Endoscopy;;  . TUBAL LIGATION    . VARICOSE VEIN SURGERY Left     Social History   Socioeconomic History  . Marital status: Married    Spouse name: Not on file  . Number of children: Not on file  . Years of education: Not on file  . Highest education level: Not on file  Occupational History  . Not on file  Tobacco Use  . Smoking status: Former Smoker    Packs/day: 0.50    Years: 35.00    Pack years: 17.50    Types: Cigarettes  . Smokeless tobacco: Never Used  . Tobacco comment: No smoing since 02/2016- "wearing Nicotine patch"  Substance and Sexual Activity  . Alcohol use: No    Alcohol/week: 0.0 standard drinks  . Drug use: No  . Sexual activity: Not on file  Other Topics Concern  . Not on file  Social History Narrative  . Not on file   Social Determinants of Health   Financial Resource Strain:   . Difficulty of Paying Living Expenses:   Food Insecurity:   . Worried About Charity fundraiser in the Last Year:   .  Ran Out of Food in the Last Year:   Transportation Needs:   . Film/video editor (Medical):   Marland Kitchen Lack of Transportation (Non-Medical):   Physical Activity:   . Days of Exercise per Week:   . Minutes of Exercise per Session:   Stress:   . Feeling of Stress :   Social Connections:   . Frequency of Communication with Friends and Family:   . Frequency of Social Gatherings with Friends and Family:   . Attends Religious Services:   . Active Member of Clubs or Organizations:   . Attends Archivist Meetings:   Marland Kitchen Marital Status:     Intimate Partner Violence:   . Fear of Current or Ex-Partner:   . Emotionally Abused:   Marland Kitchen Physically Abused:   . Sexually Abused:     Family History  Problem Relation Age of Onset  . Memory loss Mother   . Ovarian cancer Mother   . Colon polyps Neg Hx   . Colon cancer Neg Hx      Immunization History  Administered Date(s) Administered  . Influenza, Quadrivalent, Recombinant, Inj, Pf 06/09/2019  . Influenza,inj,Quad PF,6+ Mos 06/07/2016  . PFIZER SARS-COV-2 Vaccination 10/13/2019, 11/04/2019  . Pneumococcal Conjugate-13 06/09/2019  . Tdap 02/10/2015    Outpatient Encounter Medications as of 11/08/2019  Medication Sig  . Carboxymethylcellulose Sodium (THERATEARS) 0.25 % SOLN Place 1 drop into both eyes daily.  . cetirizine (ZYRTEC) 10 MG tablet Take 10 mg by mouth daily as needed for allergies.  . Multiple Vitamin (MULTIVITAMIN WITH MINERALS) TABS tablet Take 1 tablet by mouth daily with lunch.  Marland Kitchen omeprazole (PRILOSEC) 40 MG capsule Take 1 capsule (40 mg total) by mouth daily.   No facility-administered encounter medications on file as of 11/08/2019.     ROS: Gen: no fever, chills  Skin: no rash, itching ENT: no ear pain, ear drainage, nasal congestion, rhinorrhea, sinus pressure, sore throat Eyes: no blurry vision, double vision Resp: no cough, wheeze,SOB CV: no CP, palpitations, LE edema,  GI: no heartburn, n/v/d/c, abd pain GU: no dysuria, urgency, frequency, hematuria MSK: no joint pain, myalgias, back pain Neuro: no dizziness, headache, weakness, vertigo Psych: no depression, anxiety, insomnia   Allergies  Allergen Reactions  . Oxycodone Nausea And Vomiting and Other (See Comments)    Also made patient lightheaded and lethargic    BP 110/70 (BP Location: Left Arm, Patient Position: Sitting, Cuff Size: Normal)   Pulse 64   Temp (!) 96.5 F (35.8 C) (Temporal)   Ht 5\' 2"  (1.575 m)   Wt 130 lb 6.4 oz (59.1 kg)   SpO2 98%   BMI 23.85 kg/m   Wt Readings  from Last 3 Encounters:  11/08/19 130 lb 6.4 oz (59.1 kg)  09/20/19 129 lb 2 oz (58.6 kg)  08/09/19 126 lb 12.8 oz (57.5 kg)    Physical Exam  Constitutional: She is oriented to person, place, and time. She appears well-developed and well-nourished. No distress.  HENT:  Head: Normocephalic and atraumatic.  Right Ear: Tympanic membrane and ear canal normal.  Left Ear: Tympanic membrane and ear canal normal.  Nose: Nose normal.  Mouth/Throat: Oropharynx is clear and moist and mucous membranes are normal.  Eyes: Pupils are equal, round, and reactive to light. Conjunctivae are normal.  Neck: No thyromegaly present.  Cardiovascular: Normal rate, regular rhythm, normal heart sounds and intact distal pulses.  No murmur heard. Pulmonary/Chest: Effort normal and breath sounds normal. No respiratory distress. She has  no wheezes. She has no rhonchi.  Abdominal: Soft. Bowel sounds are normal. She exhibits no distension and no mass. There is no abdominal tenderness.  Musculoskeletal:        General: No edema.     Cervical back: Neck supple.  Lymphadenopathy:    She has no cervical adenopathy.  Neurological: She is alert and oriented to person, place, and time. She exhibits normal muscle tone. Coordination normal.  Skin: Skin is warm and dry.  Psychiatric: She has a normal mood and affect. Her behavior is normal.     A/P:  1. Annual physical exam - discussed importance of regular CV exercise, healthy diet, adequate sleep - due for dental exam, UTD on vision exam - colonoscopy UTD, mammo - due for [place], due for dexa - UTD on immunizations - Lipid panel - VITAMIN D 25 Hydroxy (Vit-D Deficiency, Fractures) - Comprehensive metabolic panel - next CPE in 1 year  2. Elevated LFTs - Comprehensive metabolic panel  3. Screening for lipid disorders - Lipid panel  4. Screening for osteoporosis - DG Bone Density; Future  5. Epigastric abdominal pain 6. Heartburn - much-improved Refill: -  omeprazole (PRILOSEC) 40 MG capsule; Take 1 capsule (40 mg total) by mouth daily.  Dispense: 90 capsule; Refill: 1   This visit occurred during the SARS-CoV-2 public health emergency.  Safety protocols were in place, including screening questions prior to the visit, additional usage of staff PPE, and extensive cleaning of exam room while observing appropriate contact time as indicated for disinfecting solutions.

## 2020-01-22 ENCOUNTER — Ambulatory Visit
Admission: RE | Admit: 2020-01-22 | Discharge: 2020-01-22 | Disposition: A | Discharge status: Home / Self Care | Payer: Medicare HMO | Source: Ambulatory Visit | Attending: Family Medicine | Admitting: Family Medicine

## 2020-01-22 ENCOUNTER — Other Ambulatory Visit: Payer: Self-pay

## 2020-01-22 ENCOUNTER — Other Ambulatory Visit: Payer: Self-pay | Admitting: Family Medicine

## 2020-01-22 DIAGNOSIS — N6012 Diffuse cystic mastopathy of left breast: Secondary | ICD-10-CM | POA: Diagnosis not present

## 2020-01-22 DIAGNOSIS — R928 Other abnormal and inconclusive findings on diagnostic imaging of breast: Secondary | ICD-10-CM

## 2020-01-22 DIAGNOSIS — N6001 Solitary cyst of right breast: Secondary | ICD-10-CM | POA: Diagnosis not present

## 2020-01-22 DIAGNOSIS — R922 Inconclusive mammogram: Secondary | ICD-10-CM | POA: Diagnosis not present

## 2020-02-18 ENCOUNTER — Encounter (HOSPITAL_COMMUNITY): Payer: Self-pay

## 2020-02-18 ENCOUNTER — Emergency Department (HOSPITAL_COMMUNITY): Payer: Medicare HMO

## 2020-02-18 ENCOUNTER — Inpatient Hospital Stay (HOSPITAL_COMMUNITY)
Admission: EM | Admit: 2020-02-18 | Discharge: 2020-02-21 | DRG: 444 | Disposition: A | Discharge status: Home / Self Care | Payer: Medicare HMO | Attending: Internal Medicine | Admitting: Internal Medicine

## 2020-02-18 ENCOUNTER — Inpatient Hospital Stay (HOSPITAL_COMMUNITY): Payer: Medicare HMO

## 2020-02-18 DIAGNOSIS — N83201 Unspecified ovarian cyst, right side: Secondary | ICD-10-CM | POA: Diagnosis present

## 2020-02-18 DIAGNOSIS — K449 Diaphragmatic hernia without obstruction or gangrene: Secondary | ICD-10-CM | POA: Diagnosis not present

## 2020-02-18 DIAGNOSIS — K8020 Calculus of gallbladder without cholecystitis without obstruction: Secondary | ICD-10-CM

## 2020-02-18 DIAGNOSIS — R945 Abnormal results of liver function studies: Secondary | ICD-10-CM | POA: Diagnosis not present

## 2020-02-18 DIAGNOSIS — D696 Thrombocytopenia, unspecified: Secondary | ICD-10-CM | POA: Diagnosis present

## 2020-02-18 DIAGNOSIS — I959 Hypotension, unspecified: Secondary | ICD-10-CM

## 2020-02-18 DIAGNOSIS — Z87891 Personal history of nicotine dependence: Secondary | ICD-10-CM

## 2020-02-18 DIAGNOSIS — K219 Gastro-esophageal reflux disease without esophagitis: Secondary | ICD-10-CM | POA: Diagnosis not present

## 2020-02-18 DIAGNOSIS — K5730 Diverticulosis of large intestine without perforation or abscess without bleeding: Secondary | ICD-10-CM | POA: Diagnosis not present

## 2020-02-18 DIAGNOSIS — K7200 Acute and subacute hepatic failure without coma: Secondary | ICD-10-CM | POA: Diagnosis present

## 2020-02-18 DIAGNOSIS — R748 Abnormal levels of other serum enzymes: Secondary | ICD-10-CM | POA: Diagnosis present

## 2020-02-18 DIAGNOSIS — Z9851 Tubal ligation status: Secondary | ICD-10-CM | POA: Diagnosis not present

## 2020-02-18 DIAGNOSIS — R188 Other ascites: Secondary | ICD-10-CM | POA: Diagnosis not present

## 2020-02-18 DIAGNOSIS — R7881 Bacteremia: Secondary | ICD-10-CM | POA: Diagnosis present

## 2020-02-18 DIAGNOSIS — B179 Acute viral hepatitis, unspecified: Secondary | ICD-10-CM | POA: Diagnosis not present

## 2020-02-18 DIAGNOSIS — E8729 Other acidosis: Secondary | ICD-10-CM | POA: Diagnosis present

## 2020-02-18 DIAGNOSIS — N179 Acute kidney failure, unspecified: Secondary | ICD-10-CM | POA: Diagnosis not present

## 2020-02-18 DIAGNOSIS — B961 Klebsiella pneumoniae [K. pneumoniae] as the cause of diseases classified elsewhere: Secondary | ICD-10-CM | POA: Diagnosis not present

## 2020-02-18 DIAGNOSIS — Z885 Allergy status to narcotic agent status: Secondary | ICD-10-CM | POA: Diagnosis not present

## 2020-02-18 DIAGNOSIS — Z86010 Personal history of colonic polyps: Secondary | ICD-10-CM | POA: Diagnosis not present

## 2020-02-18 DIAGNOSIS — E872 Acidosis: Secondary | ICD-10-CM | POA: Diagnosis present

## 2020-02-18 DIAGNOSIS — R9431 Abnormal electrocardiogram [ECG] [EKG]: Secondary | ICD-10-CM | POA: Diagnosis not present

## 2020-02-18 DIAGNOSIS — K831 Obstruction of bile duct: Secondary | ICD-10-CM | POA: Diagnosis not present

## 2020-02-18 DIAGNOSIS — Z87442 Personal history of urinary calculi: Secondary | ICD-10-CM

## 2020-02-18 DIAGNOSIS — E876 Hypokalemia: Secondary | ICD-10-CM | POA: Diagnosis present

## 2020-02-18 DIAGNOSIS — I700 Atherosclerosis of aorta: Secondary | ICD-10-CM | POA: Diagnosis not present

## 2020-02-18 DIAGNOSIS — Z79899 Other long term (current) drug therapy: Secondary | ICD-10-CM | POA: Diagnosis not present

## 2020-02-18 DIAGNOSIS — Z9049 Acquired absence of other specified parts of digestive tract: Secondary | ICD-10-CM | POA: Diagnosis not present

## 2020-02-18 DIAGNOSIS — K8070 Calculus of gallbladder and bile duct without cholecystitis without obstruction: Principal | ICD-10-CM | POA: Diagnosis present

## 2020-02-18 DIAGNOSIS — Z20822 Contact with and (suspected) exposure to covid-19: Secondary | ICD-10-CM | POA: Diagnosis present

## 2020-02-18 DIAGNOSIS — K8050 Calculus of bile duct without cholangitis or cholecystitis without obstruction: Secondary | ICD-10-CM

## 2020-02-18 DIAGNOSIS — R079 Chest pain, unspecified: Secondary | ICD-10-CM | POA: Diagnosis not present

## 2020-02-18 DIAGNOSIS — A419 Sepsis, unspecified organism: Secondary | ICD-10-CM | POA: Diagnosis present

## 2020-02-18 DIAGNOSIS — K838 Other specified diseases of biliary tract: Secondary | ICD-10-CM | POA: Diagnosis not present

## 2020-02-18 DIAGNOSIS — R109 Unspecified abdominal pain: Secondary | ICD-10-CM

## 2020-02-18 DIAGNOSIS — K8051 Calculus of bile duct without cholangitis or cholecystitis with obstruction: Secondary | ICD-10-CM | POA: Diagnosis not present

## 2020-02-18 HISTORY — DX: Hypotension, unspecified: I95.9

## 2020-02-18 LAB — BASIC METABOLIC PANEL
Anion gap: 18 — ABNORMAL HIGH (ref 5–15)
BUN: 21 mg/dL (ref 8–23)
CO2: 17 mmol/L — ABNORMAL LOW (ref 22–32)
Calcium: 8.8 mg/dL — ABNORMAL LOW (ref 8.9–10.3)
Chloride: 101 mmol/L (ref 98–111)
Creatinine, Ser: 1.38 mg/dL — ABNORMAL HIGH (ref 0.44–1.00)
GFR calc Af Amer: 46 mL/min — ABNORMAL LOW (ref >60)
GFR calc non Af Amer: 40 mL/min — ABNORMAL LOW (ref >60)
Glucose, Bld: 224 mg/dL — ABNORMAL HIGH (ref 70–99)
Potassium: 3.2 mmol/L — ABNORMAL LOW (ref 3.5–5.1)
Sodium: 136 mmol/L (ref 135–145)

## 2020-02-18 LAB — HEPATIC FUNCTION PANEL
ALT: 1343 U/L — ABNORMAL HIGH (ref 0–44)
AST: 2102 U/L — ABNORMAL HIGH (ref 15–41)
Albumin: 3.6 g/dL (ref 3.5–5.0)
Alkaline Phosphatase: 154 U/L — ABNORMAL HIGH (ref 38–126)
Bilirubin, Direct: 1.9 mg/dL — ABNORMAL HIGH (ref 0.0–0.2)
Indirect Bilirubin: 1 mg/dL — ABNORMAL HIGH (ref 0.3–0.9)
Total Bilirubin: 2.9 mg/dL — ABNORMAL HIGH (ref 0.3–1.2)
Total Protein: 6.8 g/dL (ref 6.5–8.1)

## 2020-02-18 LAB — CBC
HCT: 40.7 % (ref 36.0–46.0)
Hemoglobin: 13.3 g/dL (ref 12.0–15.0)
MCH: 27.9 pg (ref 26.0–34.0)
MCHC: 32.7 g/dL (ref 30.0–36.0)
MCV: 85.3 fL (ref 80.0–100.0)
Platelets: 161 10*3/uL (ref 150–400)
RBC: 4.77 MIL/uL (ref 3.87–5.11)
RDW: 12.3 % (ref 11.5–15.5)
WBC: 5.1 10*3/uL (ref 4.0–10.5)
nRBC: 0 % (ref 0.0–0.2)

## 2020-02-18 LAB — URINALYSIS, ROUTINE W REFLEX MICROSCOPIC
Bilirubin Urine: NEGATIVE
Glucose, UA: NEGATIVE mg/dL
Ketones, ur: NEGATIVE mg/dL
Nitrite: NEGATIVE
Protein, ur: NEGATIVE mg/dL
Specific Gravity, Urine: >1.046 — ABNORMAL HIGH (ref 1.005–1.030)
pH: 5 (ref 5.0–8.0)

## 2020-02-18 LAB — TROPONIN I (HIGH SENSITIVITY)
Troponin I (High Sensitivity): 10 ng/L (ref <18)
Troponin I (High Sensitivity): 8 ng/L (ref <18)

## 2020-02-18 LAB — ACETAMINOPHEN LEVEL: Acetaminophen (Tylenol), Serum: <10 ug/mL — ABNORMAL LOW (ref 10–30)

## 2020-02-18 LAB — LIPID PANEL
Cholesterol: 196 mg/dL (ref 0–200)
HDL: 52 mg/dL (ref >40)
LDL Cholesterol: 132 mg/dL — ABNORMAL HIGH (ref 0–99)
Total CHOL/HDL Ratio: 3.8 RATIO
Triglycerides: 60 mg/dL (ref <150)
VLDL: 12 mg/dL (ref 0–40)

## 2020-02-18 LAB — PHOSPHORUS: Phosphorus: 4.4 mg/dL (ref 2.5–4.6)

## 2020-02-18 LAB — HIV ANTIBODY (ROUTINE TESTING W REFLEX): HIV Screen 4th Generation wRfx: NONREACTIVE

## 2020-02-18 LAB — LIPASE, BLOOD: Lipase: 21 U/L (ref 11–51)

## 2020-02-18 LAB — ETHANOL: Alcohol, Ethyl (B): <10 mg/dL (ref <10)

## 2020-02-18 LAB — AMMONIA: Ammonia: 28 umol/L (ref 9–35)

## 2020-02-18 LAB — LACTIC ACID, PLASMA
Lactic Acid, Venous: 4.7 mmol/L (ref 0.5–1.9)
Lactic Acid, Venous: 4.1 mmol/L (ref 0.5–1.9)

## 2020-02-18 LAB — SARS CORONAVIRUS 2 BY RT PCR (HOSPITAL ORDER, PERFORMED IN ~~LOC~~ HOSPITAL LAB): SARS Coronavirus 2: NEGATIVE

## 2020-02-18 LAB — PROTIME-INR
INR: 1.2 (ref 0.8–1.2)
Prothrombin Time: 14.3 seconds (ref 11.4–15.2)

## 2020-02-18 LAB — HEPATITIS PANEL, ACUTE
HCV Ab: NONREACTIVE
Hep A IgM: NONREACTIVE
Hep B C IgM: NONREACTIVE
Hepatitis B Surface Ag: NONREACTIVE

## 2020-02-18 LAB — MAGNESIUM: Magnesium: 1.6 mg/dL — ABNORMAL LOW (ref 1.7–2.4)

## 2020-02-18 LAB — TSH: TSH: 0.764 u[IU]/mL (ref 0.350–4.500)

## 2020-02-18 MED ORDER — IOHEXOL 300 MG/ML  SOLN
100.0000 mL | Freq: Once | INTRAMUSCULAR | Status: AC | PRN
  Administered 2020-02-18: 100 mL via INTRAVENOUS

## 2020-02-18 MED ORDER — POTASSIUM CHLORIDE IN NACL 20-0.9 MEQ/L-% IV SOLN
INTRAVENOUS | Status: DC
  Filled 2020-02-18 (×5): qty 1000

## 2020-02-18 MED ORDER — MORPHINE SULFATE (PF) 4 MG/ML IV SOLN
4.0000 mg | Freq: Once | INTRAVENOUS | Status: AC
  Administered 2020-02-18: 4 mg via INTRAVENOUS
  Filled 2020-02-18: qty 1

## 2020-02-18 MED ORDER — ONDANSETRON HCL 4 MG PO TABS: 4.0000 mg | ORAL_TABLET | Freq: Four times a day (QID) | ORAL | Status: DC | PRN

## 2020-02-18 MED ORDER — PIPERACILLIN-TAZOBACTAM 3.375 G IVPB 30 MIN
3.3750 g | Freq: Once | INTRAVENOUS | Status: AC
  Administered 2020-02-18: 3.375 g via INTRAVENOUS
  Filled 2020-02-18: qty 50

## 2020-02-18 MED ORDER — GADOBUTROL 1 MMOL/ML IV SOLN
6.0000 mL | Freq: Once | INTRAVENOUS | Status: AC | PRN
  Administered 2020-02-18: 6 mL via INTRAVENOUS

## 2020-02-18 MED ORDER — LORAZEPAM 2 MG/ML IJ SOLN
0.5000 mg | Freq: Once | INTRAMUSCULAR | Status: AC
  Administered 2020-02-18: 0.5 mg via INTRAVENOUS
  Filled 2020-02-18: qty 1

## 2020-02-18 MED ORDER — ONDANSETRON HCL 4 MG/2ML IJ SOLN
4.0000 mg | Freq: Once | INTRAMUSCULAR | Status: AC
  Administered 2020-02-18: 4 mg via INTRAVENOUS
  Filled 2020-02-18: qty 2

## 2020-02-18 MED ORDER — LACTATED RINGERS IV BOLUS
1000.0000 mL | Freq: Once | INTRAVENOUS | Status: AC
  Administered 2020-02-18: 1000 mL via INTRAVENOUS

## 2020-02-18 MED ORDER — FENTANYL CITRATE (PF) 100 MCG/2ML IJ SOLN
12.5000 ug | INTRAMUSCULAR | Status: DC | PRN
  Administered 2020-02-18 – 2020-02-19 (×4): 50 ug via INTRAVENOUS
  Administered 2020-02-21: 12.5 ug via INTRAVENOUS
  Filled 2020-02-18 (×5): qty 2

## 2020-02-18 MED ORDER — ONDANSETRON HCL 4 MG/2ML IJ SOLN
4.0000 mg | Freq: Four times a day (QID) | INTRAMUSCULAR | Status: DC | PRN
  Administered 2020-02-18 – 2020-02-21 (×4): 4 mg via INTRAVENOUS
  Filled 2020-02-18 (×4): qty 2

## 2020-02-18 MED ORDER — SODIUM CHLORIDE 0.9% FLUSH
3.0000 mL | Freq: Once | INTRAVENOUS | Status: AC
  Administered 2020-02-18: 3 mL via INTRAVENOUS

## 2020-02-18 NOTE — ED Notes (Signed)
Patient transported to MRI 

## 2020-02-18 NOTE — Sepsis Progress Note (Signed)
Notified bedside nurse of need to administer antibiotics.  

## 2020-02-18 NOTE — Consult Note (Addendum)
[facility] Consult: 1:30 PM 02/18/2020  LOS: 0 days    Referring Provider: Dr Ronnald Nian  Primary Care Physician:  Ronnald Nian, DO Primary Gastroenterologist:  Dr Bryan Lemma.  Previously followed by Howie Ill, [place].     Reason for Consultation:  Abdominal pain, elevated LFTs.     HPI: Jasmine Bradford is a 64 y.o. female.  Hs GERD.  Colon polyps.  Choledocholithiasis.   Cholecystectomy 05/2016 ERCP 02/2016.  Dr. Watt Climes.  Sphincterotomy and removal choledocholithiasis. T bili 3.8, alk phos 343, AST/ALT 262/524.   ERCP 05/2016.  Dr. Sherryle Lis.  Removed additional choledocholithiasis/sludge, extended to sphincterotomy.  t bili 2.5, alk phos 247, AST/ALT 1108/857 EGD 05/2018.  Dr. Watt Climes.  Normal study. Ultrasound 05/2018: for elevated LFTs, RUQ pain.  Surgically absent GB.  CBD 10.1 mm tapering to 7.1 mm.  Increased hepatic echotexture.  ERCP 05/2018.  Dr. Watt Climes.  No stones on cholangiogram.  No material removed at balloon sweeps.  Performed 4 cm/8 mm balloon dilation at distal CBD, ampulla. No LFTs found associated w this study. Colonoscopy 09/2018.  Dr. Driscilla Grammes.  7 polyps removed:  SSA and TAs. Sigmoid diverticulosis.  Internal/external hemorrhoids.  Plan for repeat 09/2021.  Seen 08/2019 for initial eval and only ever office visit with Dr Lucille Passy to address abd pain, reflux sxs, globus sensation.  His rec was resume Prilosec 40 mg/day for 8 weeks, then reduce to 20 mg/day or lowest effective dose.  Held off on repeat EGD.    GI symptoms have been quiet lately, taking Prilosec 20 mg/day.  Sticking to a low-fat diet.  Last night she had a [employer] pizza empaenada for dinner around 530.  The label stated product had 10% saturated fat.  Within an hour patient was having nominal pain mostly in the upper  and mid abdomen which persisted and progressed.  By 930 she had her first episode of vomiting and eventually was unable to keep even water down.  Emesis was nonbloody and initially consisted of partially digested dinner.  Today pain persists, however she is able to tolerate Gatorade and Sprite within the last several hours.  Pain has migrated into the left lower quadrant but she also notes significant pain in the top of her left shoulder, not the scapula.  Initially the symptoms reminded her of when she has had issues with choledocholithiasis but now with the top of the shoulder pain and discomfort in left lower quadrant its not typical of what she has had in the past.  Small brown stool last night.  Presented to ED 0630 this morning.  T bili 2.9.  Alk phos 154.  AST/ALT 2102/1343.  Lipase 21.   Acute hepatitis A, B, and C serologies are all nonreactive. CBC wnl.   Lactate 4.7.    Wt 129 #/58.6 kg 08/2019  Family history pertinent for simple gallbladder surgery and ovarian cancer in her mother.  No history of ulcer disease, gastrointestinal cancers.  Social history.  Patient does not drink alcohol.  Past Medical History:  Diagnosis Date  . Cholelithiasis  with choledocholithiasis   . Diverticulitis   . Tobacco abuse   . Varicose vein of leg    no problems now  . Wears glasses     Past Surgical History:  Procedure Laterality Date  . CESAREAN SECTION    . CHOLECYSTECTOMY N/A 05/23/2016   Procedure: LAPAROSCOPIC CHOLECYSTECTOMY WITH INTRAOPERATIVE CHOLANGIOGRAM;  Surgeon: Jackolyn Confer, MD;  Location: [place];  Service: General;  Laterality: N/A;  . ENDOSCOPIC RETROGRADE CHOLANGIOPANCREATOGRAPHY (ERCP) WITH PROPOFOL N/A 06/06/2016   Procedure: ENDOSCOPIC RETROGRADE CHOLANGIOPANCREATOGRAPHY (ERCP) WITH PROPOFOL;  Surgeon: Clarene Essex, MD;  Location: [facility] ENDOSCOPY;  Service: Endoscopy;  Laterality: N/A;  . ERCP N/A 03/09/2016   Procedure: ENDOSCOPIC RETROGRADE CHOLANGIOPANCREATOGRAPHY (ERCP);   Surgeon: Clarene Essex, MD;  Location: [facility]OSCOPY;  Service: Endoscopy;  Laterality: N/A;  . ERCP N/A 06/12/2018   Procedure: ENDOSCOPIC RETROGRADE CHOLANGIOPANCREATOGRAPHY (ERCP);  Surgeon: Clarene Essex, MD;  Location: [facility]OSCOPY;  Service: Endoscopy;  Laterality: N/A;  . ESOPHAGOGASTRODUODENOSCOPY N/A 06/12/2018   Procedure: ESOPHAGOGASTRODUODENOSCOPY (EGD);  Surgeon: Clarene Essex, MD;  Location: [facility] END[facility]COPY;  Service: Endoscopy;  Laterality: N/A;  . laser vein surgery     last year  . REMOVAL OF STONES  06/12/2018   Procedure: REMOVAL OF STONES;  Surgeon: Clarene Essex, MD;  Location: [facility] ENDOSCOPY;  Service: Endoscopy;;  . Joan Mayans  06/12/2018   Procedure: Joan Mayans;  Surgeon: Clarene Essex, MD;  Location: [facility] ENDOSCOPY;  Service: Endoscopy;;  . TUBAL LIGATION    . VARICOSE VEIN SURGERY Left     Prior to Admission medications   Medication Sig Start Date End Date Taking? Authorizing Provider  Carboxymethylcellulose Sodium (THERATEARS) 0.25 % SOLN Place 1 drop into both eyes daily.   Yes [provider]  cetirizine (ZYRTEC) 10 MG tablet Take 10 mg by mouth daily as needed for allergies.   Yes [provider]  ibuprofen (ADVIL) 200 MG tablet Take 200 mg by mouth every 6 (six) hours as needed for headache or moderate pain.   Yes [provider]  Multiple Vitamin (MULTIVITAMIN WITH MINERALS) TABS tablet Take 1 tablet by mouth daily with lunch.   Yes [provider]  omeprazole (PRILOSEC) 40 MG capsule Take 1 capsule (40 mg total) by mouth daily. 11/08/19  Yes Cirigliano, Garvin Fila, DO    Scheduled Meds: .  morphine injection  4 mg Intravenous Once   Infusions:  PRN Meds:    Allergies as of 02/18/2020 - Review Complete 02/18/2020  Allergen Reaction Noted  . Oxycodone Nausea And Vomiting and Other (See Comments) 06/04/2016    Family History  Problem Relation Age of Onset  . Memory loss Mother   . Ovarian cancer Mother   . Colon polyps Neg Hx    . Colon cancer Neg Hx     Social History   Socioeconomic History  . Marital status: Married    Spouse name: Not on file  . Number of children: Not on file  . Years of education: Not on file  . Highest education level: Not on file  Occupational History  . Not on file  Tobacco Use  . Smoking status: Former Smoker    Packs/day: 0.50    Years: 35.00    Pack years: 17.50    Types: Cigarettes  . Smokeless tobacco: Never Used  . Tobacco comment: No smoing since 02/2016- "wearing Nicotine patch"  Vaping Use  . Vaping Use: Never used  Substance and Sexual Activity  . Alcohol use: No    Alcohol/week: 0.0 standard drinks  .  Drug use: No  . Sexual activity: Not on file  Other Topics Concern  . Not on file  Social History Narrative  . Not on file   Social Determinants of Health   Financial Resource Strain:   . Difficulty of Paying Living Expenses:   Food Insecurity:   . Worried About Charity fundraiser in the Last Year:   . Arboriculturist in the Last Year:   Transportation Needs:   . Film/video editor (Medical):   Marland Kitchen Lack of Transportation (Non-Medical):   Physical Activity:   . Days of Exercise per Week:   . Minutes of Exercise per Session:   Stress:   . Feeling of Stress :   Social Connections:   . Frequency of Communication with Friends and Family:   . Frequency of Social Gatherings with Friends and Family:   . Attends Religious Services:   . Active Member of Clubs or Organizations:   . Attends Archivist Meetings:   Marland Kitchen Marital Status:   Intimate Partner Violence:   . Fear of Current or Ex-Partner:   . Emotionally Abused:   Marland Kitchen Physically Abused:   . Sexually Abused:     REVIEW OF SYSTEMS: Constitutional: Patient is feeling unwell but not weak or dizzy. ENT:  No nose bleeds Pulm: No shortness of breath.  When the pain is accelerated, it hurts to take a deep breath. CV:  No palpitations, no LE edema.  GU:  No hematuria, no frequency.  No  amber-colored urine GI: See HPI Heme: Denies unusual bleeding or bruising. Transfusions: None Neuro:  No headaches, no peripheral tingling or numbness.  No syncope or seizures. Derm:  No itching, no rash or sores.  No yellowing of her skin. Endocrine:  No sweats or chills.  No polyuria or dysuria Immunization: Has been vaccinated for Covid. Travel:  None beyond local counties in last few months.    PHYSICAL EXAM: Vital signs in last 24 hours: Vitals:   02/18/20 1045 02/18/20 1100  BP: 102/64 118/67  Pulse: (!) 106 (!) 107  Resp: (!) 29 (!) 25  Temp:    SpO2: 94% 93%   Wt Readings from Last 3 Encounters:  02/18/20 59 kg  11/08/19 59.1 kg  09/20/19 58.6 kg    General: Alert, comfortable, nontoxic.  She looks pretty well. Head: No facial asymmetry or swelling.  No signs of head trauma. Eyes: No scleral icterus or conjunctival pallor.  EOMI. Ears: Not hard of hearing Nose: No congestion or discharge Mouth: Moist, pink, clear oropharynx.  Fair dentition.  Tongue midline. Neck: No JVD, masses, thyromegaly. Lungs: Clear bilaterally.  No labored breathing or cough Heart: RRR.  No MRG.  S1, S2 present. Abdomen: Soft.  Bowel sounds normal but hypoactive.  No HSM, masses, bruits, hernias..   Rectal: Deferred Musc/Skeltl: No joint redness, swelling or gross deformity. Extremities: No CCE.  Feet are warm Neurologic: Alert.  Oriented x3.  Moves all 4 limbs.  No tremors or gross weakness/deficits. Skin: No jaundice, though because of suntanned mild jaundice might be hard to appreciate Nodes: No cervical adenopathy. Psych: Cooperative, pleasant, fluid speech.  Intake/Output from previous day: No intake/output data recorded. Intake/Output this shift: Total I/O In: 1000 [IV Piggyback:1000] Out: -   LAB RESULTS: Recent Labs    02/18/20 0632  WBC 5.1  HGB 13.3  HCT 40.7  PLT 161   BMET Lab Results  Component Value Date   NA 136 02/18/2020   NA  140 11/08/2019   NA 140  06/07/2016   K 3.2 (L) 02/18/2020   K 4.1 11/08/2019   K 4.1 06/07/2016   CL 101 02/18/2020   CL 103 11/08/2019   CL 112 (H) 06/07/2016   CO2 17 (L) 02/18/2020   CO2 30 11/08/2019   CO2 23 06/07/2016   GLUCOSE 224 (H) 02/18/2020   GLUCOSE 89 11/08/2019   GLUCOSE 143 (H) 06/07/2016   BUN 21 02/18/2020   BUN 14 11/08/2019   BUN 5 (L) 06/07/2016   CREATININE 1.38 (H) 02/18/2020   CREATININE 0.82 11/08/2019   CREATININE 0.95 06/07/2016   CALCIUM 8.8 (L) 02/18/2020   CALCIUM 9.3 11/08/2019   CALCIUM 8.4 (L) 06/07/2016   LFT Recent Labs    02/18/20 1008  PROT 6.8  ALBUMIN 3.6  AST 2,102*  ALT 1,343*  ALKPHOS 154*  BILITOT 2.9*  BILIDIR 1.9*  IBILI 1.0*   PT/INR Lab Results  Component Value Date   INR 1.2 02/18/2020   INR 1.12 06/06/2016   INR 0.94 03/04/2016   Hepatitis Panel Recent Labs    02/18/20 1220  HEPBSAG NON REACTIVE  HCVAB PENDING  HEPAIGM PENDING  HEPBIGM PENDING   C-Diff No components found for: CDIFF Lipase     Component Value Date/Time   LIPASE 21 02/18/2020 1008    Drugs of Abuse     Component Value Date/Time   LABOPIA NONE DETECTED 03/04/2016 0650   COCAINSCRNUR NONE DETECTED 03/04/2016 0650   LABBENZ NONE DETECTED 03/04/2016 0650   AMPHETMU NONE DETECTED 03/04/2016 0650   THCU NONE DETECTED 03/04/2016 0650   LABBARB POSITIVE (A) 03/04/2016 0650     RADIOLOGY STUDIES: DG Chest 2 View  Result Date: 02/18/2020 CLINICAL DATA:  Chest pain. EXAM: CHEST - 2 VIEW COMPARISON:  03/03/2016. FINDINGS: Mediastinum hilar structures normal. Heart size normal. Low lung volumes. Mild bibasilar atelectasis/infiltrates. No pleural effusion or pneumothorax. Mild thoracic spine scoliosis. IMPRESSION: Low lung volumes with mild bibasilar atelectasis/infiltrates. Electronically Signed   By: Marcello Moores  Register   On: 02/18/2020 07:21   CT ABDOMEN PELVIS W CONTRAST  Result Date: 02/18/2020 CLINICAL DATA:  Left-sided abdominal pain. EXAM: CT ABDOMEN AND  PELVIS WITH CONTRAST TECHNIQUE: Multidetector CT imaging of the abdomen and pelvis was performed using the standard protocol following bolus administration of intravenous contrast. CONTRAST:  164m OMNIPAQUE IOHEXOL 300 MG/ML  SOLN COMPARISON:  06/05/2016 FINDINGS: Lower chest: Slight atelectasis at both lung bases posteriorly with slight bronchiectasis at the left lung base. Small hiatal hernia. 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 Cholecystectomy. No liver masses. Pancreas: Unremarkable. No pancreatic ductal dilatation or surrounding inflammatory changes. Spleen: Normal in size without focal abnormality. Adrenals/Urinary Tract: Adrenal glands are unremarkable. Kidneys are normal, without renal calculi, focal lesion, or hydronephrosis. Bladder is unremarkable. Stomach/Bowel: Chronic small hiatal hernia. Stomach is otherwise normal. Small bowel appears normal including the terminal ileum. Appendix is normal. There are few diverticula in the distal descending and proximal sigmoid portions of the colon without evidence of diverticulitis. Vascular/Lymphatic: Aortic atherosclerosis. No enlarged abdominal or pelvic lymph nodes. Reproductive: Chronic 3.6 cm cyst on the right ovary, unchanged. Uterus and left ovary appear normal. Surgical clips in both adnexa consistent with previous tubal ligation. Other: There is a tiny amount of ascites in the right pericolic gutter adjacent to the inferior tip of the right lobe of the liver. No abdominal wall hernia. Musculoskeletal: No acute bone abnormality. Narrowing of the joint space of the left hip. Degenerative disc  disease at L5-S1. IMPRESSION: 1. Tiny amount of ascites in the right pericolic gutter adjacent to the inferior tip of the right lobe of the liver. 2. Diverticulosis of the distal descending and proximal sigmoid portions of the colon. 3. 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 4. Chronic small  hiatal hernia. 5. Chronic 3.6 cm cyst on the right ovary, unchanged. 6. Aortic atherosclerosis. Aortic Atherosclerosis (ICD10-I70.0). Electronically Signed   By: Lorriane Shire M.D.   On: 02/18/2020 12:20     IMPRESSION:   *   Acute abdominal pain with nonbloody emesis. Significantly elevated transaminases suggestive of ischemic hepatopathy w lesser elevation alk phos and t bili.  Pt has hx of recurrent choledocholithiasis and is s/p cholecystectomy.  Current ultrasound shows stable, chronic intra and extrahepatic biliary ductal dilatation but no choledocholithiasis. Acute hepatitis A, B, and C serologies are all nonreactive.  *   Elevated lactate, improved with IV fluids.    PLAN:     *   MRCP, MRI.     Azucena Freed  02/18/2020, 1:30 PM Phone [phone]   Attending physician's note   I have taken a history, examined the patient and reviewed the chart. I agree with the Advanced Practitioner's note, impression and recommendations.  64 year old female with history of cholelithiasis s/p cholecystectomy, choledocholithiasis s/p multiple ERCPs admitted with abdominal pain, vomiting and significantly elevated transaminases 2000's/1000's and T bili 2.9 Acute viral hepatitis serologies negative  Need to exclude biliary obstruction with sludge MRI/ MRCP to evaluate and plan for ERCP based on that if needed  K. Denzil Magnuson , MD [phone]

## 2020-02-18 NOTE — Sepsis Progress Note (Signed)
Notified provider of need to order additional  fluid bolus. 

## 2020-02-18 NOTE — ED Provider Notes (Signed)
[facility] EMERGENCY DEPARTMENT Provider Note   CSN: [id number] Arrival date & time: 02/18/20  8416     History Chief Complaint  Patient presents with  . Abdominal Pain    Jasmine Bradford is a 64 y.o. female.  The history is provided by the patient.  Abdominal Pain Pain location:  LLQ, LUQ and epigastric Pain quality: aching   Pain severity:  Moderate Onset quality:  Gradual Timing:  Constant Progression:  Worsening Chronicity:  New Context: previous surgery (also, history of diverticulitis)   Relieved by:  Nothing Worsened by:  Movement and palpation Associated symptoms: nausea   Associated symptoms: no anorexia, no chest pain, no chills, no constipation, no cough, no diarrhea, no dysuria, no fever, no hematuria, no shortness of breath, no sore throat and no vomiting   Risk factors: multiple surgeries        Past Medical History:  Diagnosis Date  . Cholelithiasis with choledocholithiasis   . Diverticulitis   . Tobacco abuse   . Varicose vein of leg    no problems now  . Wears glasses     Patient Active Problem List   Diagnosis Date Noted  . Biliary obstruction 06/05/2016  . Elevated LFTs   . Abdominal pain 03/03/2016  . Hypokalemia 03/03/2016  . Chest pressure 03/03/2016  . LFTs abnormal 03/03/2016  . Tobacco abuse     Past Surgical History:  Procedure Laterality Date  . CESAREAN SECTION    . CHOLECYSTECTOMY N/A 05/23/2016   Procedure: LAPAROSCOPIC CHOLECYSTECTOMY WITH INTRAOPERATIVE CHOLANGIOGRAM;  Surgeon: Jackolyn Confer, MD;  Location: [place];  Service: General;  Laterality: N/A;  . ENDOSCOPIC RETROGRADE CHOLANGIOPANCREATOGRAPHY (ERCP) WITH PROPOFOL N/A 06/06/2016   Procedure: ENDOSCOPIC RETROGRADE CHOLANGIOPANCREATOGRAPHY (ERCP) WITH PROPOFOL;  Surgeon: Clarene Essex, MD;  Location: [facility] ENDOSCOPY;  Service: Endoscopy;  Laterality: N/A;  . ERCP N/A 03/09/2016   Procedure: ENDOSCOPIC RETROGRADE CHOLANGIOPANCREATOGRAPHY (ERCP);   Surgeon: Clarene Essex, MD;  Location: [facility]OS[facility];  Service: Endoscopy;  Laterality: N/A;  . ERCP N/A 06/12/2018   Procedure: ENDOSCOPIC RETROGRADE CHOLANGIOPANCREATOGRAPHY (ERCP);  Surgeon: Clarene Essex, MD;  Location: [facility] ENDOSCOPY;  Service: Endoscopy;  Laterality: N/A;  . ESOPHAGOGASTRODUODENOSCOPY N/A 06/12/2018   Procedure: ESOPHAGOGASTRODUODENOSCOPY (EGD);  Surgeon: Clarene Essex, MD;  Location: [facility] ENDOS[facility];  Service: Endoscopy;  Laterality: N/A;  . laser vein surgery     last year  . REMOVAL OF STONES  06/12/2018   Procedure: REMOVAL OF STONES;  Surgeon: Clarene Essex, MD;  Location: [facility] ENDOSCOPY;  Service: Endoscopy;;  . Joan Mayans  06/12/2018   Procedure: Joan Mayans;  Surgeon: Clarene Essex, MD;  Location: [facility] ENDOSCOPY;  Service: Endoscopy;;  . TUBAL LIGATION    . VARICOSE VEIN SURGERY Left      OB History   No obstetric history on file.     Family History  Problem Relation Age of Onset  . Memory loss Mother   . Ovarian cancer Mother   . Colon polyps Neg Hx   . Colon cancer Neg Hx     Social History   Tobacco Use  . Smoking status: Former Smoker    Packs/day: 0.50    Years: 35.00    Pack years: 17.50    Types: Cigarettes  . Smokeless tobacco: Never Used  . Tobacco comment: No smoing since 02/2016- "wearing Nicotine patch"  Vaping Use  . Vaping Use: Never used  Substance Use Topics  . Alcohol use: No    Alcohol/week: 0.0 standard drinks  . Drug use: No  Home Medications Prior to Admission medications   Medication Sig Start Date End Date Taking? Authorizing Provider  Carboxymethylcellulose Sodium (THERATEARS) 0.25 % SOLN Place 1 drop into both eyes daily.   Yes [provider]  cetirizine (ZYRTEC) 10 MG tablet Take 10 mg by mouth daily as needed for allergies.   Yes [provider]  ibuprofen (ADVIL) 200 MG tablet Take 200 mg by mouth every 6 (six) hours as needed for headache or moderate pain.   Yes [provider]  Multiple  Vitamin (MULTIVITAMIN WITH MINERALS) TABS tablet Take 1 tablet by mouth daily with lunch.   Yes [provider]  omeprazole (PRILOSEC) 40 MG capsule Take 1 capsule (40 mg total) by mouth daily. 11/08/19  Yes Cirigliano, Garvin Fila, DO    Allergies    Oxycodone  Review of Systems   Review of Systems  Constitutional: Negative for chills and fever.  HENT: Negative for ear pain and sore throat.   Eyes: Negative for pain and visual disturbance.  Respiratory: Negative for cough and shortness of breath.   Cardiovascular: Negative for chest pain and palpitations.  Gastrointestinal: Positive for abdominal pain and nausea. Negative for anorexia, constipation, diarrhea and vomiting.  Genitourinary: Negative for dysuria and hematuria.  Musculoskeletal: Negative for arthralgias and back pain.  Skin: Negative for color change and rash.  Neurological: Negative for seizures and syncope.  All other systems reviewed and are negative.   Physical Exam Updated Vital Signs  ED Triage Vitals  Enc Vitals Group     BP 02/18/20 0632 (!) 87/53     Pulse Rate 02/18/20 0632 (!) 104     Resp 02/18/20 0632 16     Temp 02/18/20 0632 99.2 F (37.3 C)     Temp Source 02/18/20 0632 Oral     SpO2 02/18/20 0632 97 %     Weight --      Height --      Head Circumference --      Peak Flow --      Pain Score 02/18/20 0629 10     Pain Loc --      Pain Edu? --      Excl. in [place]? --     Physical Exam Vitals and nursing note reviewed.  Constitutional:      General: She is not in acute distress.    Appearance: She is well-developed.  HENT:     Head: Normocephalic and atraumatic.     Mouth/Throat:     Mouth: Mucous membranes are moist.  Eyes:     Extraocular Movements: Extraocular movements intact.     Conjunctiva/sclera: Conjunctivae normal.     Pupils: Pupils are equal, round, and reactive to light.  Cardiovascular:     Rate and Rhythm: Normal rate and regular rhythm.     Heart sounds: Normal heart  sounds. No murmur heard.   Pulmonary:     Effort: Pulmonary effort is normal. No respiratory distress.     Breath sounds: Normal breath sounds.  Abdominal:     General: There is distension.     Palpations: Abdomen is soft.     Tenderness: There is generalized abdominal tenderness and tenderness in the left upper quadrant and left lower quadrant. There is guarding.  Musculoskeletal:     Cervical back: Neck supple.  Skin:    General: Skin is warm and dry.     Capillary Refill: Capillary refill takes less than 2 seconds.  Neurological:     General:  No focal deficit present.     Mental Status: She is alert.  Psychiatric:        Mood and Affect: Mood normal.     ED Results / Procedures / Treatments   Labs (all labs ordered are listed, but only abnormal results are displayed) Labs Reviewed  BASIC METABOLIC PANEL - Abnormal; Notable for the following components:      Result Value   Potassium 3.2 (*)    CO2 17 (*)    Glucose, Bld 224 (*)    Creatinine, Ser 1.38 (*)    Calcium 8.8 (*)    GFR calc non Af Amer 40 (*)    GFR calc Af Amer 46 (*)    Anion gap 18 (*)    All other components within normal limits  HEPATIC FUNCTION PANEL - Abnormal; Notable for the following components:   AST 2,102 (*)    ALT 1,343 (*)    Alkaline Phosphatase 154 (*)    Total Bilirubin 2.9 (*)    Bilirubin, Direct 1.9 (*)    Indirect Bilirubin 1.0 (*)    All other components within normal limits  LACTIC ACID, PLASMA - Abnormal; Notable for the following components:   Lactic Acid, Venous 4.7 (*)    All other components within normal limits  LACTIC ACID, PLASMA - Abnormal; Notable for the following components:   Lactic Acid, Venous 4.1 (*)    All other components within normal limits  ACETAMINOPHEN LEVEL - Abnormal; Notable for the following components:   Acetaminophen (Tylenol), Serum <10 (*)    All other components within normal limits  CULTURE, BLOOD (ROUTINE X 2)  CULTURE, BLOOD (ROUTINE X 2)    URINE CULTURE  SARS CORONAVIRUS 2 BY RT PCR (HOSPITAL ORDER, [facility])  CBC  LIPASE, BLOOD  PROTIME-INR  HEPATITIS PANEL, ACUTE  URINALYSIS, ROUTINE W REFLEX MICROSCOPIC  TROPONIN I (HIGH SENSITIVITY)  TROPONIN I (HIGH SENSITIVITY)    EKG EKG Interpretation  Date/Time:  Tuesday February 18 2020 06:38:07 EDT Ventricular Rate:  99 PR Interval:  138 QRS Duration: 76 QT Interval:  358 QTC Calculation: 459 R Axis:   72 Text Interpretation: Normal sinus rhythm Normal ECG Confirmed by Lennice Sites [phone]) on 02/18/2020 9:31:46 AM   Radiology DG Chest 2 View  Result Date: 02/18/2020 CLINICAL DATA:  Chest pain. EXAM: CHEST - 2 VIEW COMPARISON:  03/03/2016. FINDINGS: Mediastinum hilar structures normal. Heart size normal. Low lung volumes. Mild bibasilar atelectasis/infiltrates. No pleural effusion or pneumothorax. Mild thoracic spine scoliosis. IMPRESSION: Low lung volumes with mild bibasilar atelectasis/infiltrates. Electronically Signed   By: Marcello Moores  Register   On: 02/18/2020 07:21   CT ABDOMEN PELVIS W CONTRAST  Result Date: 02/18/2020 CLINICAL DATA:  Left-sided abdominal pain. EXAM: CT ABDOMEN AND PELVIS WITH CONTRAST TECHNIQUE: Multidetector CT imaging of the abdomen and pelvis was performed using the standard protocol following bolus administration of intravenous contrast. CONTRAST:  112mL OMNIPAQUE IOHEXOL 300 MG/ML  SOLN COMPARISON:  06/05/2016 FINDINGS: Lower chest: Slight atelectasis at both lung bases posteriorly with slight bronchiectasis at the left lung base. Small hiatal hernia. 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 Cholecystectomy. No liver masses. Pancreas: Unremarkable. No pancreatic ductal dilatation or surrounding inflammatory changes. Spleen: Normal in size without focal abnormality. Adrenals/Urinary Tract: Adrenal glands are unremarkable. Kidneys are normal, without renal calculi, focal lesion, or  hydronephrosis. Bladder is unremarkable. Stomach/Bowel: Chronic small hiatal hernia. Stomach is otherwise normal. Small bowel appears normal including the  terminal ileum. Appendix is normal. There are few diverticula in the distal descending and proximal sigmoid portions of the colon without evidence of diverticulitis. Vascular/Lymphatic: Aortic atherosclerosis. No enlarged abdominal or pelvic lymph nodes. Reproductive: Chronic 3.6 cm cyst on the right ovary, unchanged. Uterus and left ovary appear normal. Surgical clips in both adnexa consistent with previous tubal ligation. Other: There is a tiny amount of ascites in the right pericolic gutter adjacent to the inferior tip of the right lobe of the liver. No abdominal wall hernia. Musculoskeletal: No acute bone abnormality. Narrowing of the joint space of the left hip. Degenerative disc disease at L5-S1. IMPRESSION: 1. Tiny amount of ascites in the right pericolic gutter adjacent to the inferior tip of the right lobe of the liver. 2. Diverticulosis of the distal descending and proximal sigmoid portions of the colon. 3. 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 4. Chronic small hiatal hernia. 5. Chronic 3.6 cm cyst on the right ovary, unchanged. 6. Aortic atherosclerosis. Aortic Atherosclerosis (ICD10-I70.0). Electronically Signed   By: Lorriane Shire M.D.   On: 02/18/2020 12:20   US Abdomen Limited RUQ  Result Date: 02/18/2020 CLINICAL DATA:  ELEVATED LIVER ENZYMES.  CHOLECYSTECTOMY. EXAM: ULTRASOUND ABDOMEN LIMITED RIGHT UPPER QUADRANT COMPARISON:  CT SCAN DATED 02/18/2020 FINDINGS: Gallbladder: Removed. Common bile duct: Diameter: 10 mm, within normal limits for a post cholecystectomy patient. No visible mass or stone in the common duct. Liver: No focal lesion identified. There is intrahepatic ductal dilatation as demonstrated on the prior CT scan but this is chronic. Pneumobilia is also demonstrated on this exam and on the prior CT  scan. Within normal limits in parenchymal echogenicity. Portal vein is patent on color Doppler imaging with normal direction of blood flow towards the liver. Other: Small amount of ascites. The patient had a prior ERCP performed on 06/12/2018 which demonstrated the dilated bile ducts. IMPRESSION: No acute abnormality. 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 Electronically Signed   By: Lorriane Shire M.D.   On: 02/18/2020 14:04    Procedures Procedures (including critical care time)  Medications Ordered in ED Medications  sodium chloride flush (NS) 0.9 % injection 3 mL (3 mLs Intravenous Given 02/18/20 1043)  lactated ringers bolus 1,000 mL (0 mLs Intravenous Stopped 02/18/20 1208)  ondansetron (ZOFRAN) injection 4 mg (4 mg Intravenous Given 02/18/20 1043)  morphine 4 MG/ML injection 4 mg (4 mg Intravenous Given 02/18/20 1039)  lactated ringers bolus 1,000 mL (0 mLs Intravenous Stopped 02/18/20 1409)  piperacillin-tazobactam (ZOSYN) IVPB 3.375 g (0 g Intravenous Stopped 02/18/20 1328)  iohexol (OMNIPAQUE) 300 MG/ML solution 100 mL (100 mLs Intravenous Contrast Given 02/18/20 1201)  morphine 4 MG/ML injection 4 mg (4 mg Intravenous Given 02/18/20 1406)    ED Course  I have reviewed the triage vital signs and the nursing notes.  Pertinent labs & imaging results that were available during my care of the patient were reviewed by me and considered in my medical decision making (see chart for details).    MDM Rules/Calculators/A&P                          Jasmine Bradford is a 64 year old female with history of diverticulitis, reflux who presents the ED with abdominal pain.  Patient with mild tachycardia upon arrival.  Borderline hypotension but on recheck 122/106 upon my evaluation.  No oral fever but will take rectal temperature.  Patient with left-sided abdominal pain started last  night and progressively gotten worse.  Having some nausea but no vomiting.  Has not passed  gas.  Has a history of elevated liver enzymes in the past and has had her gallbladder removed in the past.  Has had diverticulitis before as well.  She appears uncomfortable on exam.  She has guarding and tenderness throughout but worse in the left side.  May be some mild peritonitis.  Concern for diverticulitis with micro rupture versus possible UTI versus pyelonephritis versus kidney stone.  Patient already had some lab work but mostly had a cardiac work-up as she said that she was having some chest pain.  EKG is reassuring.  Troponins normal.  Doubt cardiac process.  Lab work thus far shows no significant leukocytosis or anemia.  She does have a mild elevation in her kidney function.  Blood sugar mildly elevated at 224.  Mild anion gap of 18.  No history of diabetes.  Will give IV fluid hydration and check lactic acid and do infectious work-up as suspect intra-abdominal process.  Will get blood cultures, urine studies.  Will give IV Zofran, IV morphine. Chest x-ray does show low lung volumes likely atelectasis. Patient has no cough or sputum production.  Patient with increased liver enzymes at AST of 2100, ALT of 1300, elevated bilirubin at 2.9.  However INR is normal.  Tylenol level normal.  Patient denies any heavy alcohol use.  No diarrhea.  Hepatitis panel has been ordered.  Lactic acidosis likely more from liver dysfunction than truly from sepsis.  Normal mentation.  Less likely a cholangitis.  However has gotten IV Zosyn.  [facility] consulted with PA Sarah.  Will get right upper quadrant ultrasound.    Right upper quadrant ultrasound overall unremarkable.  Hepatitis panel negative.  GI recommends admission for further work-up.  Will likely order MRCP.  Admitted to medicine service for further care.  This chart was dictated using voice recognition software.  Despite best efforts to proofread,  errors can occur which can change the documentation meaning.    Final Clinical Impression(s) / ED  Diagnoses Final diagnoses:  Elevated liver enzymes  Acute hepatitis  Abdominal pain, unspecified abdominal location    Rx / DC Orders ED Discharge Orders    None       Lennice Sites, DO 02/18/20 1442

## 2020-02-18 NOTE — ED Notes (Signed)
Patient transported to Ultrasound 

## 2020-02-18 NOTE — ED Triage Notes (Signed)
Pt states that she began having CP last night, vomited several times, pain radiates to L shoulder, SOB

## 2020-02-18 NOTE — H&P (Addendum)
History and Physical    Jasmine Bradford FXT:[id number] DOB: [birth year] DOA: 02/18/2020  PCP: Ronnald Nian, DO  Patient coming from: Home I have personally briefly reviewed patient's old medical records in [place]  Chief Complaint: Abdominal pain with vomiting started last night  HPI: Jasmine Bradford is a 64 y.o. female with medical history significant of recurrent choledocholithiasis status post cholecystectomy, GERD, seasonal allergies presents to emergency department due to acute onset of abdominal pain.  Patient tells me that she started having upper abdominal/epigastric pain that radiates to left side of her abdomen and left shoulder., 10 out of 10, no aggravating or relieving factors, sharp in nature, associated with an episode of nonbloody vomiting last night.  No history of melena, nausea, decreased appetite, weakness or lethargy or fever or chills.  She denies headache, blurry vision, chest pain, shortness of breath, palpitation, leg swelling, urinary symptoms, generalized weakness or lethargy.  No history of autoimmune disease, photosensitivity, rash, Tylenol overdose, ethanol abuse, tobacco abuse or illicit drug use.  She has history of recurrent choledocholithiasis status post cholecystectomy in 05/2016.  She underwent ERCP in October 2017 and choledocholithiasis/sludge was removed along with sphincterectomy.  She had normal EGD in October 2019.  She had colonoscopy in 2020-multiple polyps were removed.  Plan for repeat colonoscopy in 2023.  ED Course: Upon arrival to ED: Patient tachycardic, tachypneic, blood pressure soft.  Lactic acid: 4.7.  CMP shows potassium of 3.2, CO2 of 17, anion gap: 18, AKI, afebrile with no leukocytosis, AST: 2102, ALT: 1343, alkaline phosphatase: 154, total bilirubin: 2.9.  Lipase, troponin x2 -.  Acute hepatitis panel: Negative.  Acetaminophen level: Less than 10.  UA, UC, BC, COVID-19: Pending.  Chest x-ray shows low lung  volumes with mild bibasilar atelectasis/infiltrates.  CT abdomen/pelvis shows tiny amount of ascites, diverticulosis of distal descending and proximal sigmoid colon, chronic intra and extra hepatic biliary ductal dilation, chronic small hiatal hernia, 3.6 cm cyst in right ovary.  Right upper quadrant ultrasound shows no acute abnormality.  Chronic biliary ductal dilation.  Patient received IV fluid bolus, Zosyn, morphine and Zofran in ED.  EDP consulted GI.  [facility] hospitalist consulted for admission for elevated liver enzymes.  Review of Systems: As per HPI otherwise negative.    Past Medical History:  Diagnosis Date  . Cholelithiasis with choledocholithiasis   . Diverticulitis   . Tobacco abuse   . Varicose vein of leg    no problems now  . Wears glasses     Past Surgical History:  Procedure Laterality Date  . CESAREAN SECTION    . CHOLECYSTECTOMY N/A 05/23/2016   Procedure: LAPAROSCOPIC CHOLECYSTECTOMY WITH INTRAOPERATIVE CHOLANGIOGRAM;  Surgeon: Jackolyn Confer, MD;  Location: [facility];  Service: General;  Laterality: N/A;  . ENDOSCOPIC RETROGRADE CHOLANGIOPANCREATOGRAPHY (ERCP) WITH PROPOFOL N/A 06/06/2016   Procedure: ENDOSCOPIC RETROGRADE CHOLANGIOPANCREATOGRAPHY (ERCP) WITH PROPOFOL;  Surgeon: Clarene Essex, MD;  Location[facility];  Service: Endoscopy;  Laterality: N/A;  . ERCP N/A 03/09/2016   Procedure: ENDOSCOPIC RETROGRADE CHOLANGIOPANCREATOGRAPHY (ERCP);  Surgeon: Clarene Essex, MD;  Location: [facility] ENDOSCOPY;  Service: Endoscopy;  Laterality: N/A;  . ERCP N/A 06/12/2018   Procedure: ENDOSCOPIC RETROGRADE CHOLANGIOPANCREATOGRAPHY (ERCP);  Surgeon: Clarene Essex, MD;  Location: [facility] ENDOSCOPY;  Service: Endoscopy;  Laterality: N/A;  . ESOPHAGOGASTRODUODENOSCOPY N/A 06/12/2018   Procedure: ESOPHAGOGASTRODUODENOSCOPY (EGD);  Surgeon: Clarene Essex, MD;  Location: [facility] ENDOSCOPY;  Service: Endoscopy;  Laterality: N/A;  . laser vein surgery     last year  .  REMOVAL OF STONES  06/12/2018    Procedure: REMOVAL OF STONES;  Surgeon: Clarene Essex, MD;  Location: [facility] ENDOSCOPY;  Service: Endoscopy;;  . Joan Mayans  06/12/2018   Procedure: Joan Mayans;  Surgeon: Clarene Essex, MD;  Location: [facility] ENDOSCOPY;  Service: Endoscopy;;  . TUBAL LIGATION    . VARICOSE VEIN SURGERY Left      reports that she has quit smoking. Her smoking use included cigarettes. She has a 17.50 pack-year smoking history. She has never used smokeless tobacco. She reports that she does not drink alcohol and does not use drugs.  Allergies  Allergen Reactions  . Oxycodone Nausea And Vomiting and Other (See Comments)    Also made patient lightheaded and lethargic    Family History  Problem Relation Age of Onset  . Memory loss Mother   . Ovarian cancer Mother   . Colon polyps Neg Hx   . Colon cancer Neg Hx     Prior to Admission medications   Medication Sig Start Date End Date Taking? Authorizing Provider  Carboxymethylcellulose Sodium (THERATEARS) 0.25 % SOLN Place 1 drop into both eyes daily.   Yes [provider]  cetirizine (ZYRTEC) 10 MG tablet Take 10 mg by mouth daily as needed for allergies.   Yes [provider]  ibuprofen (ADVIL) 200 MG tablet Take 200 mg by mouth every 6 (six) hours as needed for headache or moderate pain.   Yes [provider]  Multiple Vitamin (MULTIVITAMIN WITH MINERALS) TABS tablet Take 1 tablet by mouth daily with lunch.   Yes [provider]  omeprazole (PRILOSEC) 40 MG capsule Take 1 capsule (40 mg total) by mouth daily. 11/08/19  Yes Ronnald Nian, DO    Physical Exam: Vitals:   02/18/20 1046 02/18/20 1100 02/18/20 1439 02/18/20 1501  BP:  118/67  93/67  Pulse:  (!) 107  (!) 107  Resp:  (!) 25  (!) 30  Temp:   99.5 F (37.5 C)   TempSrc:   Rectal   SpO2:  93%  97%  Weight: 59 kg     Height: 5\' 2"  (1.575 m)       Constitutional: NAD, calm, comfortable, communicating well, on room air Eyes: PERRL, lids and conjunctivae:  Icteric ENMT: Mucous membranes are dry. Posterior pharynx clear of any exudate or lesions.Normal dentition.  Neck: normal, supple, no masses, no thyromegaly Respiratory: clear to auscultation bilaterally, no wheezing, no crackles. Normal respiratory effort. No accessory muscle use.  Cardiovascular: Tachycardic/ rubs / gallops. No extremity edema. 2+ pedal pulses. No carotid bruits.  Abdomen: Left-sided abdominal tenderness positive, no guarding, no rigidity, no masses palpated. No hepatosplenomegaly. Bowel sounds positive.  Musculoskeletal: no clubbing / cyanosis. No joint deformity upper and lower extremities. Good ROM, no contractures. Normal muscle tone.  Skin: no rashes, lesions, ulcers. No induration Neurologic: CN 2-12 grossly intact. Sensation intact, DTR normal. Strength 5/5 in all 4.  Psychiatric: Normal judgment and insight. Alert and oriented x 3. Normal mood.    Labs on Admission: I have personally reviewed following labs and imaging studies  CBC: Recent Labs  Lab 02/18/20 0632  WBC 5.1  HGB 13.3  HCT 40.7  MCV 85.3  PLT 510   Basic Metabolic Panel: Recent Labs  Lab 02/18/20 0632  NA 136  K 3.2*  CL 101  CO2 17*  GLUCOSE 224*  BUN 21  CREATININE 1.38*  CALCIUM 8.8*   GFR: Estimated Creatinine Clearance: 32.1 mL/min (A) (by C-G formula based on SCr  of 1.38 mg/dL (H)). Liver Function Tests: Recent Labs  Lab 02/18/20 1008  AST 2,102*  ALT 1,343*  ALKPHOS 154*  BILITOT 2.9*  PROT 6.8  ALBUMIN 3.6   Recent Labs  Lab 02/18/20 1008  LIPASE 21   No results for input(s): AMMONIA in the last 168 hours. Coagulation Profile: Recent Labs  Lab 02/18/20 1057  INR 1.2   Cardiac Enzymes: No results for input(s): CKTOTAL, CKMB, CKMBINDEX, TROPONINI in the last 168 hours. BNP (last 3 results) No results for input(s): PROBNP in the last 8760 hours. HbA1C: No results for input(s): HGBA1C in the last 72 hours. CBG: No results for input(s): GLUCAP in the last  168 hours. Lipid Profile: No results for input(s): CHOL, HDL, LDLCALC, TRIG, CHOLHDL, LDLDIRECT in the last 72 hours. Thyroid Function Tests: No results for input(s): TSH, T4TOTAL, FREET4, T3FREE, THYROIDAB in the last 72 hours. Anemia Panel: No results for input(s): VITAMINB12, FOLATE, FERRITIN, TIBC, IRON, RETICCTPCT in the last 72 hours. Urine analysis:    Component Value Date/Time   COLORURINE AMBER (A) 02/18/2020 1430   APPEARANCEUR CLEAR 02/18/2020 1430   LABSPEC >1.046 (H) 02/18/2020 1430   PHURINE 5.0 02/18/2020 1430   GLUCOSEU NEGATIVE 02/18/2020 1430   HGBUR SMALL (A) 02/18/2020 1430   BILIRUBINUR NEGATIVE 02/18/2020 1430   KETONESUR NEGATIVE 02/18/2020 1430   PROTEINUR NEGATIVE 02/18/2020 1430   NITRITE NEGATIVE 02/18/2020 1430   LEUKOCYTESUR TRACE (A) 02/18/2020 1430    Radiological Exams on Admission: DG Chest 2 View  Result Date: 02/18/2020 CLINICAL DATA:  Chest pain. EXAM: CHEST - 2 VIEW COMPARISON:  03/03/2016. FINDINGS: Mediastinum hilar structures normal. Heart size normal. Low lung volumes. Mild bibasilar atelectasis/infiltrates. No pleural effusion or pneumothorax. Mild thoracic spine scoliosis. IMPRESSION: Low lung volumes with mild bibasilar atelectasis/infiltrates. Electronically Signed   By: Marcello Moores  Register   On: 02/18/2020 07:21   CT ABDOMEN PELVIS W CONTRAST  Result Date: 02/18/2020 CLINICAL DATA:  Left-sided abdominal pain. EXAM: CT ABDOMEN AND PELVIS WITH CONTRAST TECHNIQUE: Multidetector CT imaging of the abdomen and pelvis was performed using the standard protocol following bolus administration of intravenous contrast. CONTRAST:  157mL OMNIPAQUE IOHEXOL 300 MG/ML  SOLN COMPARISON:  06/05/2016 FINDINGS: Lower chest: Slight atelectasis at both lung bases posteriorly with slight bronchiectasis at the left lung base. Small hiatal hernia. 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 Cholecystectomy. No liver masses. Pancreas:  Unremarkable. No pancreatic ductal dilatation or surrounding inflammatory changes. Spleen: Normal in size without focal abnormality. Adrenals/Urinary Tract: Adrenal glands are unremarkable. Kidneys are normal, without renal calculi, focal lesion, or hydronephrosis. Bladder is unremarkable. Stomach/Bowel: Chronic small hiatal hernia. Stomach is otherwise normal. Small bowel appears normal including the terminal ileum. Appendix is normal. There are few diverticula in the distal descending and proximal sigmoid portions of the colon without evidence of diverticulitis. Vascular/Lymphatic: Aortic atherosclerosis. No enlarged abdominal or pelvic lymph nodes. Reproductive: Chronic 3.6 cm cyst on the right ovary, unchanged. Uterus and left ovary appear normal. Surgical clips in both adnexa consistent with previous tubal ligation. Other: There is a tiny amount of ascites in the right pericolic gutter adjacent to the inferior tip of the right lobe of the liver. No abdominal wall hernia. Musculoskeletal: No acute bone abnormality. Narrowing of the joint space of the left hip. Degenerative disc disease at L5-S1. IMPRESSION: 1. Tiny amount of ascites in the right pericolic gutter adjacent to the inferior tip of the right lobe of the liver. 2. Diverticulosis of the distal descending  and proximal sigmoid portions of the colon. 3. 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 4. Chronic small hiatal hernia. 5. Chronic 3.6 cm cyst on the right ovary, unchanged. 6. Aortic atherosclerosis. Aortic Atherosclerosis (ICD10-I70.0). Electronically Signed   By: Lorriane Shire M.D.   On: 02/18/2020 12:20   US Abdomen Limited RUQ  Result Date: 02/18/2020 CLINICAL DATA:  ELEVATED LIVER ENZYMES.  CHOLECYSTECTOMY. EXAM: ULTRASOUND ABDOMEN LIMITED RIGHT UPPER QUADRANT COMPARISON:  CT SCAN DATED 02/18/2020 FINDINGS: Gallbladder: Removed. Common bile duct: Diameter: 10 mm, within normal limits for a post cholecystectomy  patient. No visible mass or stone in the common duct. Liver: No focal lesion identified. There is intrahepatic ductal dilatation as demonstrated on the prior CT scan but this is chronic. Pneumobilia is also demonstrated on this exam and on the prior CT scan. Within normal limits in parenchymal echogenicity. Portal vein is patent on color Doppler imaging with normal direction of blood flow towards the liver. Other: Small amount of ascites. The patient had a prior ERCP performed on 06/12/2018 which demonstrated the dilated bile ducts. IMPRESSION: No acute abnormality. 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 Electronically Signed   By: Lorriane Shire M.D.   On: 02/18/2020 14:04    EKG: Independently reviewed.  Normal sinus rhythm, no ST elevation or depression noted.  Assessment/Plan Principal Problem:   Shock liver Active Problems:   Hypokalemia   Elevated liver enzymes   AKI (acute kidney injury) (HCC)   High anion gap metabolic acidosis   GERD (gastroesophageal reflux disease)   Sepsis (HCC)   Hypotension   Cholelithiasis with choledocholithiasis    Shock Liver/ischemic hepatitis: -Patient presented with abdominal pain and vomiting.  Liver enzymes noted to be elevated.  AST: 2102, ALT: 1343, alkaline phosphatase: 154, total bilirubin: 2.9. -Reviewed chest x-ray, right upper quadrant ultrasound and CT abdomen/pelvis result. -Patient presented with tachycardia, tachypnea, lactic acid: 4.7, blood pressure: On lower side.  Afebrile with no leukocytosis. -PT/INR, lipase, troponin x2: Negative.  Acute hepatitis panel: Negative.  Acetaminophen level: Less than 10.  UA, BC, UC, COVID-19: Pending -Patient received IV fluid, Zosyn, morphine and Zofran in ED. -Admit patient to stepdown unit for close monitoring. -EDP consulted GI-appreciate input-recommend MRCP, MRI -Monitor vitals closely.  Check ethanol level, ammonia, lipid panel -Continue IV fluids.  Zofran as needed  for nausea and vomiting.  Fentanyl as needed for pain control.  We will keep her n.p.o. for now.   AKI: -Creatinine: 1.38, GFR: 40  -Start on IV fluids.  Monitor kidney function closely.  Hypokalemia: Potassium 3.2. -Replenished.  Check magnesium level.  Repeat BMP tomorrow a.m.  GERD: Continue PPI  High anion gap metabolic acidosis: -CO2: 17, anion gap: 18, lactic acid: 4.7 -Continue IV fluids.  Hypotension: Continue IV fluids.  Monitor blood pressure closely.  History of choledocholithiasis: status post cholecystectomy in October 2017.  Received call from radiology regarding patient's MRI preliminary result: -MRI shows intra/extra biliary dilation chronic.  Tapers in pancreatic head region.  No visible obstructive stone or pancreatic head mass.  Trace ascites.  Airspace disease in lower lobe.  Atelectasis versus infiltrate.  DVT prophylaxis: SCD Code Status: Full code Family Communication: Patient's husband present at bedside.  Plan of care discussed with patient and her husband at bedside in length and they verbalized understanding and agreed with it. Disposition Plan: Likely home in 2 to 3 days Consults called: GI by EDP Admission status: Inpatient   Mckinley Jewel MD [facility]  If 7PM-7AM, please contact night-coverage [email or web address] Password [facility]  02/18/2020, 3:44 PM

## 2020-02-19 ENCOUNTER — Inpatient Hospital Stay (HOSPITAL_COMMUNITY): Payer: Medicare HMO | Admitting: Certified Registered"

## 2020-02-19 ENCOUNTER — Inpatient Hospital Stay (HOSPITAL_COMMUNITY): Payer: Medicare HMO

## 2020-02-19 ENCOUNTER — Encounter (HOSPITAL_COMMUNITY)
Admission: EM | Disposition: A | Discharge status: Home / Self Care | Payer: Self-pay | Source: Home / Self Care | Attending: Internal Medicine

## 2020-02-19 ENCOUNTER — Encounter (HOSPITAL_COMMUNITY): Payer: Self-pay | Admitting: Internal Medicine

## 2020-02-19 DIAGNOSIS — N179 Acute kidney failure, unspecified: Secondary | ICD-10-CM

## 2020-02-19 DIAGNOSIS — K8051 Calculus of bile duct without cholangitis or cholecystitis with obstruction: Secondary | ICD-10-CM

## 2020-02-19 DIAGNOSIS — I959 Hypotension, unspecified: Secondary | ICD-10-CM

## 2020-02-19 DIAGNOSIS — E876 Hypokalemia: Secondary | ICD-10-CM

## 2020-02-19 HISTORY — PX: ENDOSCOPIC RETROGRADE CHOLANGIOPANCREATOGRAPHY (ERCP) WITH PROPOFOL: SHX5810

## 2020-02-19 HISTORY — PX: BILIARY DILATION: SHX6850

## 2020-02-19 HISTORY — PX: REMOVAL OF STONES: SHX5545

## 2020-02-19 HISTORY — PX: SPHINCTEROTOMY: SHX5544

## 2020-02-19 LAB — COMPREHENSIVE METABOLIC PANEL
ALT: 750 U/L — ABNORMAL HIGH (ref 0–44)
AST: 538 U/L — ABNORMAL HIGH (ref 15–41)
Albumin: 2.6 g/dL — ABNORMAL LOW (ref 3.5–5.0)
Alkaline Phosphatase: 104 U/L (ref 38–126)
Anion gap: 9 (ref 5–15)
BUN: 17 mg/dL (ref 8–23)
CO2: 24 mmol/L (ref 22–32)
Calcium: 8 mg/dL — ABNORMAL LOW (ref 8.9–10.3)
Chloride: 107 mmol/L (ref 98–111)
Creatinine, Ser: 0.91 mg/dL (ref 0.44–1.00)
GFR calc Af Amer: >60 mL/min (ref >60)
GFR calc non Af Amer: >60 mL/min (ref >60)
Glucose, Bld: 98 mg/dL (ref 70–99)
Potassium: 3.7 mmol/L (ref 3.5–5.1)
Sodium: 140 mmol/L (ref 135–145)
Total Bilirubin: 4.8 mg/dL — ABNORMAL HIGH (ref 0.3–1.2)
Total Protein: 5.3 g/dL — ABNORMAL LOW (ref 6.5–8.1)

## 2020-02-19 LAB — URINE CULTURE: Culture: NO GROWTH

## 2020-02-19 LAB — BLOOD CULTURE ID PANEL (REFLEXED)

## 2020-02-19 LAB — CBC
HCT: 35.5 % — ABNORMAL LOW (ref 36.0–46.0)
Hemoglobin: 11.8 g/dL — ABNORMAL LOW (ref 12.0–15.0)
MCH: 28.6 pg (ref 26.0–34.0)
MCHC: 33.2 g/dL (ref 30.0–36.0)
MCV: 86 fL (ref 80.0–100.0)
Platelets: 142 10*3/uL — ABNORMAL LOW (ref 150–400)
RBC: 4.13 MIL/uL (ref 3.87–5.11)
RDW: 12.9 % (ref 11.5–15.5)
WBC: 12.4 10*3/uL — ABNORMAL HIGH (ref 4.0–10.5)
nRBC: 0 % (ref 0.0–0.2)

## 2020-02-19 SURGERY — ENDOSCOPIC RETROGRADE CHOLANGIOPANCREATOGRAPHY (ERCP) WITH PROPOFOL
Anesthesia: General

## 2020-02-19 MED ORDER — PANTOPRAZOLE SODIUM 40 MG PO TBEC
40.0000 mg | DELAYED_RELEASE_TABLET | Freq: Every day | ORAL | Status: DC
  Administered 2020-02-19 – 2020-02-21 (×3): 40 mg via ORAL
  Filled 2020-02-19 (×3): qty 1

## 2020-02-19 MED ORDER — PROPOFOL 10 MG/ML IV BOLUS
INTRAVENOUS | Status: DC | PRN
  Administered 2020-02-19: 120 mg via INTRAVENOUS

## 2020-02-19 MED ORDER — PHENYLEPHRINE HCL-NACL 10-0.9 MG/250ML-% IV SOLN
INTRAVENOUS | Status: DC | PRN
  Administered 2020-02-19: 20 ug/min via INTRAVENOUS

## 2020-02-19 MED ORDER — ONDANSETRON HCL 4 MG/2ML IJ SOLN
INTRAMUSCULAR | Status: DC | PRN
  Administered 2020-02-19: 4 mg via INTRAVENOUS

## 2020-02-19 MED ORDER — INDOMETHACIN 50 MG RE SUPP
RECTAL | Status: DC | PRN
  Administered 2020-02-19: 100 mg via RECTAL

## 2020-02-19 MED ORDER — SODIUM CHLORIDE 0.9 % IV SOLN: 3.0000 g | Freq: Once | INTRAVENOUS | Status: DC

## 2020-02-19 MED ORDER — LACTATED RINGERS IV SOLN: INTRAVENOUS | Status: DC | PRN

## 2020-02-19 MED ORDER — LACTATED RINGERS IV SOLN
INTRAVENOUS | Status: AC | PRN
  Administered 2020-02-19: 1000 mL via INTRAVENOUS

## 2020-02-19 MED ORDER — LIDOCAINE 2% (20 MG/ML) 5 ML SYRINGE
INTRAMUSCULAR | Status: DC | PRN
  Administered 2020-02-19: 30 mg via INTRAVENOUS

## 2020-02-19 MED ORDER — SUCCINYLCHOLINE CHLORIDE 200 MG/10ML IV SOSY
PREFILLED_SYRINGE | INTRAVENOUS | Status: DC | PRN
Start: 2020-02-19 — End: 2020-02-19
  Administered 2020-02-19: 100 mg via INTRAVENOUS

## 2020-02-19 MED ORDER — INDOMETHACIN 50 MG RE SUPP
RECTAL | Status: AC
  Filled 2020-02-19: qty 2

## 2020-02-19 MED ORDER — GLUCAGON HCL RDNA (DIAGNOSTIC) 1 MG IJ SOLR
INTRAMUSCULAR | Status: DC | PRN
Start: 2020-02-19 — End: 2020-02-19
  Administered 2020-02-19: .2 mg via INTRAVENOUS

## 2020-02-19 MED ORDER — GLUCAGON HCL RDNA (DIAGNOSTIC) 1 MG IJ SOLR
INTRAMUSCULAR | Status: AC
  Filled 2020-02-19: qty 1

## 2020-02-19 MED ORDER — DEXAMETHASONE SODIUM PHOSPHATE 10 MG/ML IJ SOLN
INTRAMUSCULAR | Status: DC | PRN
  Administered 2020-02-19: 4 mg via INTRAVENOUS

## 2020-02-19 MED ORDER — SODIUM CHLORIDE 0.9 % IV SOLN: 2.0000 g | INTRAVENOUS | Status: DC

## 2020-02-19 MED ORDER — PIPERACILLIN-TAZOBACTAM 3.375 G IVPB 30 MIN
3.3750 g | Freq: Three times a day (TID) | INTRAVENOUS | Status: AC
  Administered 2020-02-19 – 2020-02-20 (×3): 3.375 g via INTRAVENOUS
  Filled 2020-02-19 (×3): qty 50

## 2020-02-19 MED ORDER — INDOMETHACIN 50 MG RE SUPP: 100.0000 mg | Freq: Once | RECTAL | Status: AC

## 2020-02-19 NOTE — Progress Notes (Signed)
POST ERCP NOTE:  Pt doing well.  Mild LUQ abdominal pain (better than before). No nausea or melena. Vitals stable. Abdominal Exam: soft, nontender. Discussed with Marden Noble (pt's husband).  I have shown him endoscopic pictures  Advance to low-fat diet in AM. Please call if with any ?Chandra Batch    [place] [phone]

## 2020-02-19 NOTE — Progress Notes (Addendum)
Daily Rounding Note  02/19/2020, 9:24 AM  LOS: 1 day   SUBJECTIVE:   Chief complaint:     BPs 80s-90s/50s-60s.  HR low 100s.  No fevers. Pain in left lq and shoulder continue.  No nausea    OBJECTIVE:         Vital signs in last 24 hours:    Temp:  [98.1 F (36.7 C)-99.5 F (37.5 C)] 98.8 F (37.1 C) (06/30 0822) Pulse Rate:  [97-108] 97 (06/30 0627) Resp:  [15-32] 18 (06/30 0822) BP: (85-122)/(56-106) 85/60 (06/30 0822) SpO2:  [92 %-99 %] 97 % (06/30 0822) Weight:  [59 kg-63.1 kg] 63 kg (06/30 0542)   Filed Weights   02/18/20 1046 02/18/20 2206 02/19/20 0542  Weight: 59 kg 63.1 kg 63 kg   General: looks well, comfortable. Heart: RRR Chest: Clear bilaterally no labored breathing or cough Abdomen: Soft.  Tenderness without guarding in the lower abdomen.  Active bowel sounds.  No distention Extremities: No CCE. Neuro/Psych: Alert.  Oriented x3.  No gross deficits or weakness.  No tremors.  Affect within normal limits.  Intake/Output from previous day: 06/29 0701 - 06/30 0700 In: 2288.3 [I.V.:1288.3; IV Piggyback:1000] Out: 600 [Urine:600]  Intake/Output this shift: No intake/output data recorded.  Lab Results: Recent Labs    02/18/20 0632 02/19/20 0341  WBC 5.1 12.4*  HGB 13.3 11.8*  HCT 40.7 35.5*  PLT 161 142*   BMET Recent Labs    02/18/20 0632 02/19/20 0341  NA 136 140  K 3.2* 3.7  CL 101 107  CO2 17* 24  GLUCOSE 224* 98  BUN 21 17  CREATININE 1.38* 0.91  CALCIUM 8.8* 8.0*   LFT Recent Labs    02/18/20 1008 02/19/20 0341  PROT 6.8 5.3*  ALBUMIN 3.6 2.6*  AST 2,102* 538*  ALT 1,343* 750*  ALKPHOS 154* 104  BILITOT 2.9* 4.8*  BILIDIR 1.9*  --   IBILI 1.0*  --    PT/INR Recent Labs    02/18/20 1057  LABPROT 14.3  INR 1.2   Hepatitis Panel Recent Labs    02/18/20 1220  HEPBSAG NON REACTIVE  HCVAB NON REACTIVE  HEPAIGM NON REACTIVE  HEPBIGM NON REACTIVE     Studies/Results: DG Chest 2 View  Result Date: 02/18/2020 CLINICAL DATA:  Chest pain. EXAM: CHEST - 2 VIEW COMPARISON:  03/03/2016. FINDINGS: Mediastinum hilar structures normal. Heart size normal. Low lung volumes. Mild bibasilar atelectasis/infiltrates. No pleural effusion or pneumothorax. Mild thoracic spine scoliosis. IMPRESSION: Low lung volumes with mild bibasilar atelectasis/infiltrates. Electronically Signed   By: Marcello Moores  Register   On: 02/18/2020 07:21   CT ABDOMEN PELVIS W CONTRAST  Result Date: 02/18/2020 CLINICAL DATA:  Left-sided abdominal pain. EXAM: CT ABDOMEN AND PELVIS WITH CONTRAST TECHNIQUE: Multidetector CT imaging of the abdomen and pelvis was performed using the standard protocol following bolus administration of intravenous contrast. CONTRAST:  149m OMNIPAQUE IOHEXOL 300 MG/ML  SOLN COMPARISON:  06/05/2016 FINDINGS: Lower chest: Slight atelectasis at both lung bases posteriorly with slight bronchiectasis at the left lung base. Small hiatal hernia. 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 Cholecystectomy. No liver masses. Pancreas: Unremarkable. No pancreatic ductal dilatation or surrounding inflammatory changes. Spleen: Normal in size without focal abnormality. Adrenals/Urinary Tract: Adrenal glands are unremarkable. Kidneys are normal, without renal calculi, focal lesion, or hydronephrosis. Bladder is unremarkable. Stomach/Bowel: Chronic small hiatal hernia. Stomach is otherwise normal. Small bowel appears normal including the terminal ileum. Appendix  is normal. There are few diverticula in the distal descending and proximal sigmoid portions of the colon without evidence of diverticulitis. Vascular/Lymphatic: Aortic atherosclerosis. No enlarged abdominal or pelvic lymph nodes. Reproductive: Chronic 3.6 cm cyst on the right ovary, unchanged. Uterus and left ovary appear normal. Surgical clips in both adnexa consistent with previous tubal  ligation. Other: There is a tiny amount of ascites in the right pericolic gutter adjacent to the inferior tip of the right lobe of the liver. No abdominal wall hernia. Musculoskeletal: No acute bone abnormality. Narrowing of the joint space of the left hip. Degenerative disc disease at L5-S1. IMPRESSION: 1. Tiny amount of ascites in the right pericolic gutter adjacent to the inferior tip of the right lobe of the liver. 2. Diverticulosis of the distal descending and proximal sigmoid portions of the colon. 3. 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 4. Chronic small hiatal hernia. 5. Chronic 3.6 cm cyst on the right ovary, unchanged. 6. Aortic atherosclerosis. Aortic Atherosclerosis (ICD10-I70.0). Electronically Signed   By: Lorriane Shire M.D.   On: 02/18/2020 12:20   MR ABDOMEN MRCP W WO CONTAST  Result Date: 02/19/2020 CLINICAL DATA:  Acute abdominal pain and jaundice. EXAM: MRI ABDOMEN WITHOUT AND WITH CONTRAST (INCLUDING MRCP) TECHNIQUE: Multiplanar multisequence MR imaging of the abdomen was performed both before and after the administration of intravenous contrast. Heavily T2-weighted images of the biliary and pancreatic ducts were obtained, and three-dimensional MRCP images were rendered by post processing. CONTRAST:  62m GADAVIST GADOBUTROL 1 MMOL/ML IV SOLN COMPARISON:  CT scan and ultrasound same date. FINDINGS: Lower chest: Small left pleural effusion and bibasilar atelectasis. Hepatobiliary: Moderate intrahepatic biliary dilatation appears relatively stable. No worrisome hepatic lesions. Dilated common bile duct in the porta hepatis measuring up to 13 mm. Abrupt caliber change and findings consistent with common bile duct stones. The largest measures approximately 6.5 mm. The distal common bile duct near the ampulla is normal in caliber measuring 3.4 mm. Pancreas:  No mass, inflammation or ductal dilatation. Spleen:  Normal size. No focal lesions. Adrenals/Urinary Tract: The  adrenal glands and kidneys are unremarkable. No worrisome renal lesions or hydronephrosis. Stomach/Bowel: Stomach, duodenum, visualized small bowel and visualized colon are unremarkable. Vascular/Lymphatic: The aorta and branch vessels are patent. The major venous structures are patent. No mesenteric or retroperitoneal mass or adenopathy. Other:  Small amount perihepatic ascites. Musculoskeletal: No significant bony findings. IMPRESSION: 1. Stable appearing chronic intra and extrahepatic biliary dilatation. Common bile duct stones are present however and likely causing some degree of obstruction. Recommend ERCP. 2. No abdominal mass lesions or adenopathy. 3. Small amount of free abdominal fluid. 4. Small left pleural effusion and bibasilar atelectasis. These results will be called to the ordering clinician or representative by the Radiologist Assistant, and communication documented in the PACS or [employer] Electronically Signed   By: PMarijo SanesM.D.   On: 02/19/2020 05:19   [place]domen Limited RUQ  Result Date: 02/18/2020 CLINICAL DATA:  ELEVATED LIVER ENZYMES.  CHOLECYSTECTOMY. EXAM: ULTRASOUND ABDOMEN LIMITED RIGHT UPPER QUADRANT COMPARISON:  CT SCAN DATED 02/18/2020 FINDINGS: Gallbladder: Removed. Common bile duct: Diameter: 10 mm, within normal limits for a post cholecystectomy patient. No visible mass or stone in the common duct. Liver: No focal lesion identified. There is intrahepatic ductal dilatation as demonstrated on the prior CT scan but this is chronic. Pneumobilia is also demonstrated on this exam and on the prior CT scan. Within normal limits in parenchymal echogenicity. Portal vein is patent on  color Doppler imaging with normal direction of blood flow towards the liver. Other: Small amount of ascites. The patient had a prior ERCP performed on 06/12/2018 which demonstrated the dilated bile ducts. IMPRESSION: No acute abnormality. Chronic biliary ductal dilatation as demonstrated on prior  exams. Electronically Signed   By: Lorriane Shire M.D.   On: 02/18/2020 14:04   Scheduled Meds: Continuous Infusions: . 0.9 % NaCl with KCl 20 mEq / L 100 mL/hr at 02/19/20 0343   PRN Meds:.fentaNYL (SUBLIMAZE) injection, ondansetron **OR** ondansetron (ZOFRAN) IV   ASSESMENT:   *   Acute abdominal pain with nonbloody emesis. Significantly elevated transaminases w lesser elevation alk phos and t bili.  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  S/p cholecystectomy.   CT and ultrasound: stable, chronic intra/extrahepatic biliary ductal dilatation, no choledocholithiasis. MRCP confirms chronic intra/extra hepatic duct dilation and choledocholithiasis, small FF in abdomen.   Acute hepatitis A, B, and C serologies are all nonreactive. LFTS improved but the level of elevation suggests element of liver hypoperfusion is also at play.   WBCs 12.4.    *   Elevated lactate, improved with IV fluids. Not rechecked this AM.    *   Resolved hypokalemia.   *   Hypotension.      PLAN   *   ERCP 3 pm today.  Resume Zosyn for 3 doses. Got dose zosyn 1 PM yesterday.   *   ? add ursodiol as outpatient?    Azucena Freed  02/19/2020, 9:24 AM Phone [phone]    Attending physician's note   I have taken an interval history, reviewed the chart and examined the patient. I agree with the Advanced Practitioner's note, impression and recommendations.   Recurrent choledocholithiasis with abnormal LFTs  for ERCP today.  I discussed risks and benefits.  The risks including risks of bleeding, pancreatitis, perforation.  Benefits also discussed.  She wishes to proceed.  She was seen in preop.  Carmell Austria, MD Velora Heckler Fabienne Bruns [phone].

## 2020-02-19 NOTE — Progress Notes (Signed)
Patient ID: Jasmine Bradford, female   DOB: [birth year], 64 y.o.   MRN: [id number]  PROGRESS NOTE    Jasmine Bradford [facility]  IRS:[id number] DOB: May 26, 1956 DOA: 02/18/2020 PCP: Ronnald Nian, DO   Brief Narrative:  64 year old female with history of recurrent choledocholithiasis status post cholecystectomy, and GERD, seasonal allergies presented on 02/18/2020 with worsening abdominal pain.  In the ED, she had elevated lactic acid, AKI, AST 2102, ALT 1343, total bilirubin of 2.9.  CT abdomen/pelvis showed tiny amount of ascites, diverticulosis of distal descending and proximal sigmoid colon, chronic intra and extrahepatic biliary dilation, chronic small hiatal hernia, 3.6 cm cyst in right ovary.  Right upper quadrant ultrasound showed no acute abnormality except for chronic biliary ductal dilatation.  She was started on IV fluids and antibiotics.  GI was consulted.  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  Will probably need ERCP.  AST and ALT improving but bili total worsening. -NPO.  Continue pain management.  Antiemetics as needed -BCID showing Klebsiella pneumoniae.  Continue current antibiotics.  Follow sensitivities  Acute kidney injury Metabolic acidosis -Improving.  Monitor continue IV fluids.  Acidosis is resolved  Hypokalemia -Improved.  Continue IV fluids with potassium  Thrombocytopenia -Monitor.  No signs of bleeding  Hypotension -Blood pressure still on the lower side.  Patient states that her blood pressure normally runs pretty low.  Continue IV fluids.  Monitor  GERD -Continue PPI   DVT prophylaxis: SCDs Code Status: Full Family Communication:  Patient at bedside Disposition Plan: Status is: Inpatient  Remains inpatient appropriate because:Inpatient level of care appropriate due to severity of illness   Dispo: The patient is from: Home              Anticipated d/c is to: Home              Anticipated d/c date is: 1 day              Patient currently is not medically stable to d/c.   Consultants: GI  Procedures: None  Antimicrobials: Zosyn from 02/18/2020 onwards   Subjective: Patient seen and examined at bedside.  Still complains of intermittent abdominal pain but improving.  No overnight fever.  Currently not nauseous or vomiting.  Objective: Vitals:   02/19/20 0627 02/19/20 0822 02/19/20 1136 02/19/20 1425  BP:  (!) 85/60 93/61 101/64  Pulse: 97  96 (!) 104  Resp:  18 16 (!) 21  Temp:  98.8 F (37.1 C) 98.6 F (37 C) 98.8 F (37.1 C)  TempSrc:  Oral Oral Temporal  SpO2: 98% 97% 96% 92%  Weight:      Height:        Intake/Output Summary (Last 24 hours) at 02/19/2020 1505 Last data filed at 02/19/2020 1136 Gross per 24 hour  Intake 1288.33 ml  Output 900 ml  Net 388.33 ml   Filed Weights   02/18/20 1046 02/18/20 2206 02/19/20 0542  Weight: 59 kg 63.1 kg 63 kg    Examination:  General exam: Appears calm and comfortable  Respiratory system: Bilateral decreased breath sounds at bases Cardiovascular system: S1 & S2 heard, Rate controlled Gastrointestinal system: Abdomen is nondistended, soft and mildly tender in the right upper quadrant. Normal bowel sounds heard. Extremities:  No cyanosis, clubbing, edema  Central nervous system: Alert and oriented. No focal neurological deficits. Moving extremities Skin: No rashes, lesions or ulcers Psychiatry: Judgement and insight appear normal. Mood & affect appropriate.     Data Reviewed: I have personally reviewed following labs and imaging studies  CBC: Recent Labs  Lab 02/18/20 0632 02/19/20 0341  WBC 5.1 12.4*  HGB 13.3 11.8*  HCT 40.7 35.5*  MCV  85.3 86.0  PLT 161 532*   Basic Metabolic Panel: Recent Labs  Lab 02/18/20 0632 02/18/20 1530 02/19/20 0341  NA 136  --  140  K 3.2*  --  3.7  CL 101  --  107  CO2 17*  --  24  GLUCOSE 224*  --  98  BUN 21  --  17  CREATININE 1.38*  --  0.91  CALCIUM 8.8*  --  8.0*  MG  --  1.6*  --   PHOS  --  4.4  --    GFR: Estimated Creatinine Clearance: 53.8 mL/min (by C-G formula based on SCr of 0.91 mg/dL). Liver Function Tests: Recent Labs  Lab 02/18/20 1008 02/19/20 0341  AST 2,102* 538*  ALT 1,343* 750*  ALKPHOS 154* 104  BILITOT 2.9* 4.8*  PROT 6.8 5.3*  ALBUMIN 3.6 2.6*   Recent Labs  Lab 02/18/20 1008  LIPASE 21   Recent Labs  Lab 02/18/20 1530  AMMONIA 28   Coagulation Profile: Recent Labs  Lab 02/18/20 1057  INR 1.2   Cardiac Enzymes: No results for input(s): CKTOTAL, CKMB, CKMBINDEX, TROPONINI in the last 168 hours. BNP (last 3 results) No results for input(s): PROBNP in the last 8760 hours. HbA1C: No results for input(s): HGBA1C in the last 72 hours. CBG: No results for input(s): GLUCAP in the last 168 hours. Lipid Profile: Recent Labs    02/18/20 1530  CHOL 196  HDL 52  LDLCALC 132*  TRIG 60  CHOLHDL 3.8   Thyroid Function Tests: Recent Labs    02/18/20 1530  TSH 0.764   Anemia Panel: No results for input(s): VITAMINB12, FOLATE, FERRITIN, TIBC, IRON, RETICCTPCT in the last 72 hours. Sepsis Labs: Recent Labs  Lab 02/18/20 1008 02/18/20 1233  LATICACIDVEN 4.7* 4.1*    Recent Results (from the past 240 hour(s))  Blood culture (routine x 2)     Status: None (Preliminary result)   Collection Time: 02/18/20 10:18 AM   Specimen: BLOOD  Result Value Ref Range Status   Specimen Description BLOOD RIGHT ANTECUBITAL  Final   Special Requests   Final    BOTTLES DRAWN AEROBIC AND ANAEROBIC Blood Culture adequate volume   Culture  Setup Time   Final    GRAM NEGATIVE RODS IN BOTH AEROBIC AND ANAEROBIC BOTTLES CRITICAL RESULT CALLED TO,  READ BACK BY AND VERIFIED WITH: PHARMD LAURA SEAY AT [place] BY MESSAN H. ON 02/19/2020 Performed at [facility], [place] [address] [place], [place] [address]    Culture GRAM NEGATIVE RODS  Final   Report Status PENDING  Incomplete  Blood Culture ID Panel (Reflexed)     Status: Abnormal   Collection Time: 02/18/20 10:18 AM  Result Value Ref Range Status   Enterococcus species NOT DETECTED NOT DETECTED Final   Listeria monocytogenes NOT DETECTED NOT DETECTED Final   Staphylococcus species NOT DETECTED NOT DETECTED Final   Staphylococcus aureus (BCID) NOT DETECTED NOT DETECTED Final   Streptococcus species NOT DETECTED NOT DETECTED Final   Streptococcus agalactiae NOT DETECTED NOT DETECTED Final  Streptococcus pneumoniae NOT DETECTED NOT DETECTED Final   Streptococcus pyogenes NOT DETECTED NOT DETECTED Final   Acinetobacter baumannii NOT DETECTED NOT DETECTED Final   Enterobacteriaceae species DETECTED (A) NOT DETECTED Final    Comment: Enterobacteriaceae represent a large family of gram-negative bacteria, not a single organism. CRITICAL RESULT CALLED TO, READ BACK BY AND VERIFIED WITH: PHARMD LAURA SEAY AT 0255 BY MESSAN H ON 02/19/2020    Enterobacter cloacae complex NOT DETECTED NOT DETECTED Final   Escherichia coli NOT DETECTED NOT DETECTED Final   Klebsiella oxytoca NOT DETECTED NOT DETECTED Final   Klebsiella pneumoniae DETECTED (A) NOT DETECTED Final    Comment: CRITICAL RESULT CALLED TO, READ BACK BY AND VERIFIED WITH: PHARMD LAURA SEAY AT 0255 BY MESSAN H ON 02/19/2020    Proteus species NOT DETECTED NOT DETECTED Final   Serratia marcescens NOT DETECTED NOT DETECTED Final   Carbapenem resistance NOT DETECTED NOT DETECTED Final   Haemophilus influenzae NOT DETECTED NOT DETECTED Final   Neisseria meningitidis NOT DETECTED NOT DETECTED Final   Pseudomonas aeruginosa NOT DETECTED NOT DETECTED Final   Candida albicans NOT DETECTED NOT DETECTED Final   Candida glabrata NOT  DETECTED NOT DETECTED Final   Candida krusei NOT DETECTED NOT DETECTED Final   Candida parapsilosis NOT DETECTED NOT DETECTED Final   Candida tropicalis NOT DETECTED NOT DETECTED Final    Comment: Performed at [facility], [place] [address] [place], [place] [address]  Blood culture (routine x 2)     Status: None (Preliminary result)   Collection Time: 02/18/20 10:30 AM   Specimen: BLOOD RIGHT HAND  Result Value Ref Range Status   Specimen Description BLOOD RIGHT HAND  Final   Special Requests   Final    BOTTLES DRAWN AEROBIC AND ANAEROBIC Blood Culture adequate volume   Culture  Setup Time   Final    IN BOTH AEROBIC AND ANAEROBIC BOTTLES GRAM NEGATIVE RODS CRITICAL VALUE NOTED.  VALUE IS CONSISTENT WITH PREVIOUSLY REPORTED AND CALLED VALUE.    Culture   Final    NO GROWTH < 24 HOURS Performed at [facility], [place] [address], [place], [place] [address]    Report Status PENDING  Incomplete  SARS Coronavirus 2 by RT PCR (hospital order, performed in [facility] hospital lab) Nasopharyngeal Nasopharyngeal Swab     Status: None   Collection Time: 02/18/20  2:12 PM   Specimen: Nasopharyngeal Swab  Result Value Ref Range Status   SARS Coronavirus 2 NEGATIVE NEGATIVE Final    Comment: (NOTE) SARS-CoV-2 target nucleic acids are NOT DETECTED.  The SARS-CoV-2 RNA is generally detectable in upper and lower respiratory specimens during the acute phase of infection. The lowest concentration of SARS-CoV-2 viral copies this assay can detect is 250 copies / mL. A negative result does not preclude SARS-CoV-2 infection and should not be used as the sole basis for treatment or other patient management decisions.  A negative result may occur with improper specimen collection / handling, submission of specimen other than nasopharyngeal swab, presence of viral mutation(s) within the areas targeted by this assay, and inadequate number of viral copies (<250 copies / mL). A negative result  must be combined with clinical observations, patient history, and epidemiological information.  Fact Sheet for Patients:   StrictlyIdeas.no  Fact Sheet for Healthcare Providers: BankingDealers.co.za  This test is not yet approved or  cleared by the [place] FDA and has been authorized for detection and/or diagnosis of SARS-CoV-2 by FDA  under an Emergency Use Authorization (EUA).  This EUA will remain in effect (meaning this test can be used) for the duration of the COVID-19 declaration under Section 564(b)(1) of the Act, 21 U.S.C. section 360bbb-3(b)(1), unless the authorization is terminated or revoked sooner.  Performed at [facility], [place] [address] [place], [place] [address]   Urine culture     Status: None   Collection Time: 02/18/20  2:30 PM   Specimen: Urine, Random  Result Value Ref Range Status   Specimen Description URINE, RANDOM  Final   Special Requests NONE  Final   Culture   Final    NO GROWTH Performed at [facility], [place] [address] [place], [place] [address]    Report Status 02/19/2020 FINAL  Final         Radiology Studies: DG Chest 2 View  Result Date: 02/18/2020 CLINICAL DATA:  Chest pain. EXAM: CHEST - 2 VIEW COMPARISON:  03/03/2016. FINDINGS: Mediastinum hilar structures normal. Heart size normal. Low lung volumes. Mild bibasilar atelectasis/infiltrates. No pleural effusion or pneumothorax. Mild thoracic spine scoliosis. IMPRESSION: Low lung volumes with mild bibasilar atelectasis/infiltrates. Electronically Signed   By: Marcello Moores  Register   On: 02/18/2020 07:21   CT ABDOMEN PELVIS W CONTRAST  Result Date: 02/18/2020 CLINICAL DATA:  Left-sided abdominal pain. EXAM: CT ABDOMEN AND PELVIS WITH CONTRAST TECHNIQUE: Multidetector CT imaging of the abdomen and pelvis was performed using the standard protocol following bolus administration of intravenous contrast. CONTRAST:  162mL OMNIPAQUE  IOHEXOL 300 MG/ML  SOLN COMPARISON:  06/05/2016 FINDINGS: Lower chest: Slight atelectasis at both lung bases posteriorly with slight bronchiectasis at the left lung base. Small hiatal hernia. 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 Cholecystectomy. No liver masses. Pancreas: Unremarkable. No pancreatic ductal dilatation or surrounding inflammatory changes. Spleen: Normal in size without focal abnormality. Adrenals/Urinary Tract: Adrenal glands are unremarkable. Kidneys are normal, without renal calculi, focal lesion, or hydronephrosis. Bladder is unremarkable. Stomach/Bowel: Chronic small hiatal hernia. Stomach is otherwise normal. Small bowel appears normal including the terminal ileum. Appendix is normal. There are few diverticula in the distal descending and proximal sigmoid portions of the colon without evidence of diverticulitis. Vascular/Lymphatic: Aortic atherosclerosis. No enlarged abdominal or pelvic lymph nodes. Reproductive: Chronic 3.6 cm cyst on the right ovary, unchanged. Uterus and left ovary appear normal. Surgical clips in both adnexa consistent with previous tubal ligation. Other: There is a tiny amount of ascites in the right pericolic gutter adjacent to the inferior tip of the right lobe of the liver. No abdominal wall hernia. Musculoskeletal: No acute bone abnormality. Narrowing of the joint space of the left hip. Degenerative disc disease at L5-S1. IMPRESSION: 1. Tiny amount of ascites in the right pericolic gutter adjacent to the inferior tip of the right lobe of the liver. 2. Diverticulosis of the distal descending and proximal sigmoid portions of the colon. 3. 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 4. Chronic small hiatal hernia. 5. Chronic 3.6 cm cyst on the right ovary, unchanged. 6. Aortic atherosclerosis. Aortic Atherosclerosis (ICD10-I70.0). Electronically Signed   By: Lorriane Shire M.D.   On: 02/18/2020 12:20   MR  ABDOMEN MRCP W WO CONTAST  Result Date: 02/19/2020 CLINICAL DATA:  Acute abdominal pain and jaundice. EXAM: MRI ABDOMEN WITHOUT AND WITH CONTRAST (INCLUDING MRCP) TECHNIQUE: Multiplanar multisequence MR imaging of the abdomen was performed both before and after the administration of intravenous contrast. Heavily T2-weighted images of the biliary and pancreatic ducts were  obtained, and three-dimensional MRCP images were rendered by post processing. CONTRAST:  77mL GADAVIST GADOBUTROL 1 MMOL/ML IV SOLN COMPARISON:  CT scan and ultrasound same date. FINDINGS: Lower chest: Small left pleural effusion and bibasilar atelectasis. Hepatobiliary: Moderate intrahepatic biliary dilatation appears relatively stable. No worrisome hepatic lesions. Dilated common bile duct in the porta hepatis measuring up to 13 mm. Abrupt caliber change and findings consistent with common bile duct stones. The largest measures approximately 6.5 mm. The distal common bile duct near the ampulla is normal in caliber measuring 3.4 mm. Pancreas:  No mass, inflammation or ductal dilatation. Spleen:  Normal size. No focal lesions. Adrenals/Urinary Tract: The adrenal glands and kidneys are unremarkable. No worrisome renal lesions or hydronephrosis. Stomach/Bowel: Stomach, duodenum, visualized small bowel and visualized colon are unremarkable. Vascular/Lymphatic: The aorta and branch vessels are patent. The major venous structures are patent. No mesenteric or retroperitoneal mass or adenopathy. Other:  Small amount perihepatic ascites. Musculoskeletal: No significant bony findings. IMPRESSION: 1. Stable appearing chronic intra and extrahepatic biliary dilatation. Common bile duct stones are present however and likely causing some degree of obstruction. Recommend ERCP. 2. No abdominal mass lesions or adenopathy. 3. Small amount of free abdominal fluid. 4. Small left pleural effusion and bibasilar atelectasis. These results will be called to the ordering  clinician or representative by the Radiologist Assistant, and communication documented in the PACS or [employer]. Electronically Signed   By: Marijo Sanes M.D.   On: 02/19/2020 05:19   US Abdomen Limited RUQ  Result Date: 02/18/2020 CLINICAL DATA:  ELEVATED LIVER ENZYMES.  CHOLECYSTECTOMY. EXAM: ULTRASOUND ABDOMEN LIMITED RIGHT UPPER QUADRANT COMPARISON:  CT SCAN DATED 02/18/2020 FINDINGS: Gallbladder: Removed. Common bile duct: Diameter: 10 mm, within normal limits for a post cholecystectomy patient. No visible mass or stone in the common duct. Liver: No focal lesion identified. There is intrahepatic ductal dilatation as demonstrated on the prior CT scan but this is chronic. Pneumobilia is also demonstrated on this exam and on the prior CT scan. Within normal limits in parenchymal echogenicity. Portal vein is patent on color Doppler imaging with normal direction of blood flow towards the liver. Other: Small amount of ascites. The patient had a prior ERCP performed on 06/12/2018 which demonstrated the dilated bile ducts. IMPRESSION: No acute abnormality. 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 Electronically Signed   By: Lorriane Shire M.D.   On: 02/18/2020 14:04        Scheduled Meds: . indomethacin  100 mg Rectal Once  . [MAR Hold] pantoprazole  40 mg Oral Daily   Continuous Infusions: . 0.9 % NaCl with KCl 20 mEq / L 100 mL/hr at 02/19/20 0343  . lactated ringers    . [MAR Hold] piperacillin-tazobactam 3.375 g (02/19/20 1259)          Aline August, MD [facility] 02/19/2020, 3:05 PM

## 2020-02-19 NOTE — Anesthesia Preprocedure Evaluation (Addendum)
Anesthesia Evaluation  Patient identified by MRN, date of birth, ID band Patient awake    Reviewed: Allergy & Precautions, NPO status , Patient's Chart, lab work & pertinent test results  History of Anesthesia Complications Negative for: history of anesthetic complications  Airway Mallampati: II  TM Distance: >3 FB Neck ROM: Full    Dental  (+) Dental Advisory Given, Chipped   Pulmonary former smoker,  02/18/2020 SARS coronavirus NEG   breath sounds clear to auscultation       Cardiovascular (-) angina Rhythm:Regular Rate:Normal  '17 ECHO: EF 60-65%, valves OK   Neuro/Psych negative neurological ROS     GI/Hepatic GERD  Medicated and Controlled,(+) Hepatitis -Cholelithiasis: elevated LFTs N/v with cholelithiasis   Endo/Other  negative endocrine ROS  Renal/GU negative Renal ROS     Musculoskeletal   Abdominal   Peds  Hematology Hb 11.8, plt 142k   Anesthesia Other Findings   Reproductive/Obstetrics                            Anesthesia Physical Anesthesia Plan  ASA: III  Anesthesia Plan: General   Post-op Pain Management:    Induction: Intravenous  PONV Risk Score and Plan: 3 and Ondansetron, Dexamethasone and Treatment may vary due to age or medical condition  Airway Management Planned: Oral ETT  Additional Equipment: None  Intra-op Plan:   Post-operative Plan: Extubation in OR  Informed Consent: I have reviewed the patients History and Physical, chart, labs and discussed the procedure including the risks, benefits and alternatives for the proposed anesthesia with the patient or authorized representative who has indicated his/her understanding and acceptance.     Dental advisory given  Plan Discussed with: CRNA and Surgeon  Anesthesia Plan Comments:        Anesthesia Quick Evaluation

## 2020-02-19 NOTE — Anesthesia Procedure Notes (Signed)
Procedure Name: Intubation Date/Time: 02/19/2020 3:56 PM Performed by: Renato Shin, CRNA Pre-anesthesia Checklist: Patient identified, Emergency Drugs available, Suction available and Patient being monitored Patient Re-evaluated:Patient Re-evaluated prior to induction Oxygen Delivery Method: Circle system utilized Preoxygenation: Pre-oxygenation with 100% oxygen Induction Type: IV induction, Rapid sequence and Cricoid Pressure applied Laryngoscope Size: Miller and 2 Grade View: Grade I Tube type: Oral Number of attempts: 1 Airway Equipment and Method: Stylet and Oral airway Placement Confirmation: ETT inserted through vocal cords under direct vision,  positive ETCO2 and breath sounds checked- equal and bilateral Secured at: 22 cm Tube secured with: Tape Dental Injury: Teeth and Oropharynx as per pre-operative assessment

## 2020-02-19 NOTE — Anesthesia Postprocedure Evaluation (Signed)
Anesthesia Post Note  Patient: Jasmine Bradford  Procedure(s) Performed: ENDOSCOPIC RETROGRADE CHOLANGIOPANCREATOGRAPHY (ERCP) WITH PROPOFOL (N/A ) SPHINCTEROTOMY BILIARY DILATION REMOVAL OF STONES     Patient location during evaluation: PACU Anesthesia Type: General Level of consciousness: awake and alert, oriented and patient cooperative Pain management: pain level controlled Vital Signs Assessment: post-procedure vital signs reviewed and stable Respiratory status: spontaneous breathing, nonlabored ventilation, respiratory function stable and patient connected to nasal cannula oxygen Cardiovascular status: blood pressure returned to baseline and stable Postop Assessment: no apparent nausea or vomiting Anesthetic complications: no   No complications documented.  Last Vitals:  Vitals:   02/19/20 1701 02/19/20 1716  BP: 111/73 106/75  Pulse: (!) 109 96  Resp: 20 14  Temp: 37.2 C   SpO2: 95% 94%    Last Pain:  Vitals:   02/19/20 1716  TempSrc:   PainSc: 6                  Orpah Hausner,E. Rosia Syme

## 2020-02-19 NOTE — Transfer of Care (Signed)
Immediate Anesthesia Transfer of Care Note  Patient: Jasmine Bradford  Procedure(s) Performed: ENDOSCOPIC RETROGRADE CHOLANGIOPANCREATOGRAPHY (ERCP) WITH PROPOFOL (N/A )  Patient Location: PACU  Anesthesia Type:General  Level of Consciousness: drowsy and patient cooperative  Airway & Oxygen Therapy: Patient Spontanous Breathing and Patient connected to face mask oxygen  Post-op Assessment: Report given to RN and Post -op Vital signs reviewed and stable  Post vital signs: Reviewed and stable  Last Vitals:  Vitals Value Taken Time  BP 111/73 02/19/20 1701  Temp    Pulse 102 02/19/20 1706  Resp 22 02/19/20 1706  SpO2 90 % 02/19/20 1706  Vitals shown include unvalidated device data.  Last Pain:  Vitals:   02/19/20 1425  TempSrc: Temporal  PainSc: 8       Patients Stated Pain Goal: 0 (70/01/74 9449)  Complications: No complications documented.

## 2020-02-19 NOTE — Progress Notes (Signed)
   02/18/20 2210  Assess: MEWS Score  Temp 99.2 F (37.3 C)  BP (!) 89/62  Pulse Rate 100  ECG Heart Rate (!) 101  Resp 19  SpO2 94 %  O2 Device Room Air  Assess: MEWS Score  MEWS Temp 0  MEWS Systolic 1  MEWS Pulse 1  MEWS RR 0  MEWS LOC 0  MEWS Score 2  MEWS Score Color Yellow  Assess: if the MEWS score is Yellow or Red  Were vital signs taken at a resting state? Yes  Focused Assessment Documented focused assessment  Early Detection of Sepsis Score *See Row Information* Low  MEWS guidelines implemented *See Row Information* Yes  Treat  MEWS Interventions Escalated (See documentation below)  Take Vital Signs  Increase Vital Sign Frequency  Yellow: Q 2hr X 2 then Q 4hr X 2, if remains yellow, continue Q 4hrs  Escalate  MEWS: Escalate Yellow: discuss with charge nurse/RN and consider discussing with provider and RRT  Notify: Charge Nurse/RN  Name of Charge Nurse/RN Notified Jamal Collin RN  Date Charge Nurse/RN Notified 02/18/20  Time Charge Nurse/RN Notified 2230

## 2020-02-19 NOTE — Op Note (Signed)
[facility] Patient Name: Jasmine Bradford Procedure Date : 02/19/2020 MRN: [id number] Attending MD: Jackquline Denmark , MD Date of Birth: 1955/09/07 CSN: [id number] Age: 64 Admit Type: Inpatient Procedure:                ERCP Indications:              Recurrent choledocholithiasis with abnormal LFTs. Providers:                Jackquline Denmark, MD, Cleda Daub, RN, Laverda Sorenson,                            Technician Referring MD:              Medicines:                Monitored Anesthesia Care, IV Zosyn, Indocin                            suppositories Complications:            No immediate complications. Estimated Blood Loss:     Estimated blood loss: none. Procedure:                Pre-Anesthesia Assessment:                           - Prior to the procedure, a History and Physical                            was performed, and patient medications and                            allergies were reviewed. The patient's tolerance of                            previous anesthesia was also reviewed. The risks                            and benefits of the procedure and the sedation                            options and risks were discussed with the patient.                            All questions were answered, and informed consent                            was obtained. Prior Anticoagulants: The patient has                            taken no previous anticoagulant or antiplatelet                            agents. ASA Grade Assessment: II - A patient with                            mild systemic  disease. After reviewing the risks                            and benefits, the patient was deemed in                            satisfactory condition to undergo the procedure.                           After obtaining informed consent, the scope was                            passed under direct vision. Throughout the                            procedure, the patient's blood pressure,  pulse, and                            oxygen saturations were monitored continuously. The                            [id number]-Q180V ([id number]) Olympus Duodensocope was                            introduced through the mouth, and used to inject                            contrast into and used to inject contrast into the                            bile duct. The ERCP was accomplished without                            difficulty. The patient tolerated the procedure                            well. Scope In: Scope Out: Findings:      The scout film was normal with clips c/w previous cholecystectomy. The       major papilla was mildly stenotic s/o post sphincterotomy stenosis. A       long 0.035 inch Soft Jagwire was passed into the biliary tree. The       short-nosed traction sphincterotome was passed over the guidewire and       the bile duct was then deeply cannulated. Contrast was injected. I       personally interpreted the bile duct images. Ductal flow of contrast was       adequate. Image quality was adequate. Contrast extended to the entire       biliary tree.      A cholecystectomy had been performed. Multiple filling defects c/w       choledocholithiasis was found in dilated common bile duct and common       hepatic duct. The largest diameter of CBD was 16 mm. A 6 mm biliary       sphincterotomy was made with a monofilament traction (standard)  sphincterotome using ERBE's endocut electrocautery. There was self       limited oozing from the sphincterotomy which did not require treatment.       Distal common bile duct was dilated with a 06-02-11 mm CRE balloon (to a       maximum balloon size of 12 mm) for 1 min each. The biliary tree was       swept with a 97mm and 16 mm balloon starting at the bifurcation several       times. All stones (6-8) of varying sizes (largest 1.2 cm) and sludge       were removed (see endoscopic photodocumentation). Little pus also came       out.  Thereafter, postocclusion cholangiogram did not reveal any residual       filling defects. Hence, we decided to hold off on placing biliary stent.      Pancreatogram was intentionally not performed. Impression:               -Choledocholithiasis s/p extension of biliary                            sphincterotomy, sphincteroplasty followed by stone                            extraction.                           -Ascending cholangitis. Recommendation:           - Return patient to hospital ward for ongoing care.                           - Watch for pancreatitis, bleeding, perforation,                            and cholangitis.                           - Clear liquid diet today at 8 PM. Advance diet to                            low-fat in AM.                           - Trend LFTs.                           - Return to GI clinic in 4 weeks. At follow-up,                            recheck LFTs.                           - Use antibiotics for 5 days.                           - She does understand long-term complications of  sphincterotomy and dilated CBD in form of recurrent                            choledocholithiasis.                           - D/W family. Procedure Code(s):        --- Professional ---                           [phone], 9, Endoscopic retrograde                            cholangiopancreatography (ERCP); with                            trans-endoscopic balloon dilation of                            biliary/pancreatic duct(s) or of ampulla                            (sphincteroplasty), including sphincterotomy, when                            performed, each duct                           43264, Endoscopic retrograde                            cholangiopancreatography (ERCP); with removal of                            calculi/debris from biliary/pancreatic duct(s)                           85462, Endoscopic catheterization of the biliary                             ductal system, radiological supervision and                            interpretation Diagnosis Code(s):        --- Professional ---                           K80.51, Calculus of bile duct without cholangitis                            or cholecystitis with obstruction                           Z90.49, Acquired absence of other specified parts                            of digestive tract  R93.2, Abnormal findings on diagnostic imaging of                            liver and biliary tract CPT copyright 2019 [employer]. All rights reserved. The codes documented in this report are preliminary and upon coder review may  be revised to meet current compliance requirements. Jackquline Denmark, MD 02/19/2020 5:11:25 PM This report has been signed electronically. Number of Addenda: 0

## 2020-02-19 NOTE — Progress Notes (Signed)
PHARMACY - PHYSICIAN COMMUNICATION CRITICAL VALUE ALERT - BLOOD CULTURE IDENTIFICATION (BCID)  Jasmine Bradford is an 64 y.o. female who presented to [facility] on 02/18/2020 with a chief complaint of abdominal pain  Assessment: 64 yo lady with 1/4 BC with Kleb pneumno  Name of physician (or Provider) Contacted: Ardith Dark  Current antibiotics: zosyn  Changes to prescribed antibiotics recommended:  Since she will need additional anaerobic coverage with rocephin will continue zosyn for now  Results for orders placed or performed during the hospital encounter of 02/18/20  Blood Culture ID Panel (Reflexed) (Collected: 02/18/2020 10:18 AM)  Result Value Ref Range   Enterococcus species NOT DETECTED NOT DETECTED   Listeria monocytogenes NOT DETECTED NOT DETECTED   Staphylococcus species NOT DETECTED NOT DETECTED   Staphylococcus aureus (BCID) NOT DETECTED NOT DETECTED   Streptococcus species NOT DETECTED NOT DETECTED   Streptococcus agalactiae NOT DETECTED NOT DETECTED   Streptococcus pneumoniae NOT DETECTED NOT DETECTED   Streptococcus pyogenes NOT DETECTED NOT DETECTED   Acinetobacter baumannii NOT DETECTED NOT DETECTED   Enterobacteriaceae species DETECTED (A) NOT DETECTED   Enterobacter cloacae complex NOT DETECTED NOT DETECTED   Escherichia coli NOT DETECTED NOT DETECTED   Klebsiella oxytoca NOT DETECTED NOT DETECTED   Klebsiella pneumoniae DETECTED (A) NOT DETECTED   Proteus species NOT DETECTED NOT DETECTED   Serratia marcescens NOT DETECTED NOT DETECTED   Carbapenem resistance NOT DETECTED NOT DETECTED   Haemophilus influenzae NOT DETECTED NOT DETECTED   Neisseria meningitidis NOT DETECTED NOT DETECTED   Pseudomonas aeruginosa NOT DETECTED NOT DETECTED   Candida albicans NOT DETECTED NOT DETECTED   Candida glabrata NOT DETECTED NOT DETECTED   Candida krusei NOT DETECTED NOT DETECTED   Candida parapsilosis NOT DETECTED NOT DETECTED   Candida tropicalis NOT DETECTED NOT  DETECTED    Ho Parisi Poteet 02/19/2020  3:01 AM

## 2020-02-20 ENCOUNTER — Other Ambulatory Visit: Payer: Self-pay | Admitting: Gastroenterology

## 2020-02-20 ENCOUNTER — Encounter (HOSPITAL_COMMUNITY): Payer: Self-pay | Admitting: Gastroenterology

## 2020-02-20 DIAGNOSIS — K759 Inflammatory liver disease, unspecified: Secondary | ICD-10-CM

## 2020-02-20 DIAGNOSIS — K8050 Calculus of bile duct without cholangitis or cholecystitis without obstruction: Secondary | ICD-10-CM

## 2020-02-20 DIAGNOSIS — R7881 Bacteremia: Secondary | ICD-10-CM

## 2020-02-20 DIAGNOSIS — B961 Klebsiella pneumoniae [K. pneumoniae] as the cause of diseases classified elsewhere: Secondary | ICD-10-CM

## 2020-02-20 LAB — CBC WITH DIFFERENTIAL/PLATELET
Abs Immature Granulocytes: 1.08 10*3/uL — ABNORMAL HIGH (ref 0.00–0.07)
Basophils Absolute: 0 10*3/uL (ref 0.0–0.1)
Basophils Relative: 0 %
Eosinophils Absolute: 0 10*3/uL (ref 0.0–0.5)
Eosinophils Relative: 0 %
HCT: 32 % — ABNORMAL LOW (ref 36.0–46.0)
Hemoglobin: 10.5 g/dL — ABNORMAL LOW (ref 12.0–15.0)
Immature Granulocytes: 11 %
Lymphocytes Relative: 4 %
Lymphs Abs: 0.5 10*3/uL — ABNORMAL LOW (ref 0.7–4.0)
MCH: 28.1 pg (ref 26.0–34.0)
MCHC: 32.8 g/dL (ref 30.0–36.0)
MCV: 85.6 fL (ref 80.0–100.0)
Monocytes Absolute: 0.2 10*3/uL (ref 0.1–1.0)
Monocytes Relative: 2 %
Neutro Abs: 8.4 10*3/uL — ABNORMAL HIGH (ref 1.7–7.7)
Neutrophils Relative %: 83 %
Platelets: 126 10*3/uL — ABNORMAL LOW (ref 150–400)
RBC: 3.74 MIL/uL — ABNORMAL LOW (ref 3.87–5.11)
RDW: 13 % (ref 11.5–15.5)
WBC: 10.2 10*3/uL (ref 4.0–10.5)
nRBC: 0 % (ref 0.0–0.2)

## 2020-02-20 LAB — COMPREHENSIVE METABOLIC PANEL
ALT: 453 U/L — ABNORMAL HIGH (ref 0–44)
AST: 157 U/L — ABNORMAL HIGH (ref 15–41)
Albumin: 2.3 g/dL — ABNORMAL LOW (ref 3.5–5.0)
Alkaline Phosphatase: 91 U/L (ref 38–126)
Anion gap: 6 (ref 5–15)
BUN: 15 mg/dL (ref 8–23)
CO2: 22 mmol/L (ref 22–32)
Calcium: 8.2 mg/dL — ABNORMAL LOW (ref 8.9–10.3)
Chloride: 111 mmol/L (ref 98–111)
Creatinine, Ser: 0.77 mg/dL (ref 0.44–1.00)
GFR calc Af Amer: >60 mL/min (ref >60)
GFR calc non Af Amer: >60 mL/min (ref >60)
Glucose, Bld: 111 mg/dL — ABNORMAL HIGH (ref 70–99)
Potassium: 4 mmol/L (ref 3.5–5.1)
Sodium: 139 mmol/L (ref 135–145)
Total Bilirubin: 1.9 mg/dL — ABNORMAL HIGH (ref 0.3–1.2)
Total Protein: 5.1 g/dL — ABNORMAL LOW (ref 6.5–8.1)

## 2020-02-20 LAB — MAGNESIUM: Magnesium: 2.1 mg/dL (ref 1.7–2.4)

## 2020-02-20 MED ORDER — PIPERACILLIN-TAZOBACTAM 3.375 G IVPB
3.3750 g | Freq: Three times a day (TID) | INTRAVENOUS | Status: DC
  Administered 2020-02-20 – 2020-02-21 (×3): 3.375 g via INTRAVENOUS
  Filled 2020-02-20 (×5): qty 50

## 2020-02-20 MED ORDER — PIPERACILLIN-TAZOBACTAM 3.375 G IVPB 30 MIN: 3.3750 g | Freq: Three times a day (TID) | INTRAVENOUS | Status: DC

## 2020-02-20 MED ORDER — OXYCODONE HCL 5 MG PO TABS: 5.0000 mg | ORAL_TABLET | Freq: Four times a day (QID) | ORAL | Status: DC | PRN

## 2020-02-20 NOTE — Progress Notes (Addendum)
Patient ID: Jasmine Bradford, female   DOB: Jan 01, 1956, 64 y.o.   MRN: [id number]  PROGRESS NOTE    Jasmine Bradford [facility]  QZE:[id number] DOB: [birth year] DOA: 02/18/2020 PCP: Ronnald Nian, DO   Brief Narrative:  64 year old female with history of recurrent choledocholithiasis status post cholecystectomy, and GERD, seasonal allergies presented on 02/18/2020 with worsening abdominal pain.  In the ED, she had elevated lactic acid, AKI, AST 2102, ALT 1343, total bilirubin of 2.9.  CT abdomen/pelvis showed tiny amount of ascites, diverticulosis of distal descending and proximal sigmoid colon, chronic intra and extrahepatic biliary dilation, chronic small hiatal hernia, 3.6 cm cyst in right ovary.  Right upper quadrant ultrasound showed no acute abnormality except for chronic biliary ductal dilatation.  She was started on IV fluids and antibiotics.  GI was consulted.  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  Continue pain management.  Antiemetics as needed -BCID showing Klebsiella pneumoniae.  Currently on Zosyn.  Follow sensitivities  Acute kidney injury Metabolic acidosis -Improving.  DC IV fluids.  Acidosis is resolved  Hypokalemia -Improved.  Resolved.  Thrombocytopenia -Monitor.  No signs of bleeding  Hypotension -Blood pressure still on the lower side.  Patient states that her blood pressure normally runs pretty low.  DC IV fluids.  Monitor  GERD -Continue PPI   DVT prophylaxis:  SCDs Code Status: Full Family Communication: Patient at bedside Disposition Plan: Status is: Inpatient  Remains inpatient appropriate because:Inpatient level of care appropriate due to severity of illness.  Will need sensitivities of Klebsiella prior to discharging on oral antibiotics   Dispo: The patient is from: Home              Anticipated d/c is to: Home              Anticipated d/c date is: 1 day              Patient currently is not medically stable to d/c.   Consultants: GI  Procedures: None  Antimicrobials: Zosyn from 02/18/2020 onwards.    Subjective: Patient seen and examined at bedside.  Feels better.  Abdominal pain is improving but still having intermittent right upper abdominal pain.  No overnight fevers.  Tolerating clear liquid diet.  Objective: Vitals:   02/19/20 1801 02/19/20 2042 02/20/20 0039 02/20/20 0416  BP: 92/73 96/63 93/65  90/65  Pulse: 92 89 71 80  Resp: 16 17 15    Temp: 98.6 F (37 C) 98.4 F (36.9 C) 98.2 F (36.8 C) 98.5 F (36.9 C)  TempSrc: Oral Oral Oral Oral  SpO2: 90% 92% 95% 93%  Weight:    62.7 kg  Height:        Intake/Output Summary (Last 24 hours) at 02/20/2020 0817 Last data filed at 02/20/2020 0500 Gross per 24 hour  Intake 1810 ml  Output 800 ml  Net 1010 ml   Filed Weights   02/18/20 2206 02/19/20 0542 02/20/20 0416  Weight: 63.1 kg 63 kg 62.7 kg    Examination:  General exam: Appears calm and comfortable.  No distress Respiratory system: Bilateral decreased breath sounds  at bases, no wheezing Cardiovascular system: Rate controlled, S1-S2 heard Gastrointestinal system: Abdomen is nondistended, soft and mildly tender in the right upper quadrant.  Normal bowel sounds heard. Extremities: No clubbing or cyanosis or lower extremity edema  Central nervous system: Awake and alert. No focal neurological deficits. Moving extremities Skin: No lesions or ulcers Psychiatry: Judgement and insight appear normal. Mood & affect  appropriate.     Data Reviewed: I have personally reviewed following labs and imaging studies  CBC: Recent Labs  Lab 02/18/20 0632 02/19/20 0341 02/20/20 0411  WBC 5.1 12.4* 10.2  NEUTROABS  --   --  8.4*  HGB 13.3 11.8* 10.5*  HCT 40.7 35.5* 32.0*  MCV 85.3 86.0 85.6  PLT 161 142* 983*   Basic Metabolic Panel: Recent Labs  Lab 02/18/20 0632 02/18/20 1530 02/19/20 0341 02/20/20 0411  NA 136  --  140 139  K 3.2*  --  3.7 4.0  CL 101  --  107 111  CO2 17*  --  24 22  GLUCOSE 224*  --  98 111*  BUN 21  --  17 15  CREATININE 1.38*  --  0.91 0.77  CALCIUM 8.8*  --  8.0* 8.2*  MG  --  1.6*  --  2.1  PHOS  --  4.4  --   --    GFR: Estimated Creatinine Clearance: 61 mL/min (by C-G formula based on SCr of 0.77 mg/dL). Liver Function Tests: Recent Labs  Lab 02/18/20 1008 02/19/20 0341 02/20/20 0411  AST 2,102* 538* 157*  ALT 1,343* 750* 453*  ALKPHOS 154* 104 91  BILITOT 2.9* 4.8* 1.9*  PROT 6.8 5.3* 5.1*  ALBUMIN 3.6 2.6* 2.3*   Recent Labs  Lab 02/18/20 1008  LIPASE 21   Recent Labs  Lab 02/18/20 1530  AMMONIA 28   Coagulation Profile: Recent Labs  Lab 02/18/20 1057  INR 1.2   Cardiac Enzymes: No results for input(s): CKTOTAL, CKMB, CKMBINDEX, TROPONINI in the last 168 hours. BNP (last 3 results) No results for input(s): PROBNP in the last 8760 hours. HbA1C: No results for input(s): HGBA1C in the last 72 hours. CBG: No results for input(s): GLUCAP in the last 168 hours. Lipid Profile: Recent Labs    02/18/20 1530  CHOL 196  HDL 52  LDLCALC 132*  TRIG 60  CHOLHDL 3.8   Thyroid Function Tests: Recent Labs    02/18/20 1530  TSH 0.764   Anemia Panel: No results for input(s): VITAMINB12, FOLATE, FERRITIN, TIBC, IRON, RETICCTPCT in the last 72 hours. Sepsis Labs: Recent Labs  Lab 02/18/20 1008 02/18/20 1233  LATICACIDVEN 4.7* 4.1*    Recent Results (from the past 240 hour(s))  Blood culture (routine x 2)     Status: None  (Preliminary result)   Collection Time: 02/18/20 10:18 AM   Specimen: BLOOD  Result Value Ref Range Status   Specimen Description BLOOD RIGHT ANTECUBITAL  Final   Special Requests   Final    BOTTLES DRAWN AEROBIC AND ANAEROBIC Blood Culture adequate volume   Culture  Setup Time   Final    GRAM NEGATIVE RODS IN BOTH AEROBIC AND ANAEROBIC BOTTLES CRITICAL RESULT CALLED TO, READ BACK BY AND VERIFIED WITH: PHARMD LAURA SEAY AT [place] [facility] BY MESSAN H. ON 02/19/2020 Performed at [facility], [place] [address] [place], [place] [address]    Culture GRAM NEGATIVE RODS  Final   Report Status PENDING  Incomplete  Blood Culture ID Panel (Reflexed)  Status: Abnormal   Collection Time: 02/18/20 10:18 AM  Result Value Ref Range Status   Enterococcus species NOT DETECTED NOT DETECTED Final   Listeria monocytogenes NOT DETECTED NOT DETECTED Final   Staphylococcus species NOT DETECTED NOT DETECTED Final   Staphylococcus aureus (BCID) NOT DETECTED NOT DETECTED Final   Streptococcus species NOT DETECTED NOT DETECTED Final   Streptococcus agalactiae NOT DETECTED NOT DETECTED Final   Streptococcus pneumoniae NOT DETECTED NOT DETECTED Final   Streptococcus pyogenes NOT DETECTED NOT DETECTED Final   Acinetobacter baumannii NOT DETECTED NOT DETECTED Final   Enterobacteriaceae species DETECTED (A) NOT DETECTED Final    Comment: Enterobacteriaceae represent a large family of gram-negative bacteria, not a single organism. CRITICAL RESULT CALLED TO, READ BACK BY AND VERIFIED WITH: PHARMD LAURA SEAY AT 0255 BY MESSAN H ON 02/19/2020    Enterobacter cloacae complex NOT DETECTED NOT DETECTED Final   Escherichia coli NOT DETECTED NOT DETECTED Final   Klebsiella oxytoca NOT DETECTED NOT DETECTED Final   Klebsiella pneumoniae DETECTED (A) NOT DETECTED Final    Comment: CRITICAL RESULT CALLED TO, READ BACK BY AND VERIFIED WITH: PHARMD LAURA SEAY AT 0255 BY MESSAN H ON 02/19/2020    Proteus species NOT DETECTED NOT  DETECTED Final   Serratia marcescens NOT DETECTED NOT DETECTED Final   Carbapenem resistance NOT DETECTED NOT DETECTED Final   Haemophilus influenzae NOT DETECTED NOT DETECTED Final   Neisseria meningitidis NOT DETECTED NOT DETECTED Final   Pseudomonas aeruginosa NOT DETECTED NOT DETECTED Final   Candida albicans NOT DETECTED NOT DETECTED Final   Candida glabrata NOT DETECTED NOT DETECTED Final   Candida krusei NOT DETECTED NOT DETECTED Final   Candida parapsilosis NOT DETECTED NOT DETECTED Final   Candida tropicalis NOT DETECTED NOT DETECTED Final    Comment: Performed at [facility], [place] [address], [place], [place] [address]  Blood culture (routine x 2)     Status: None (Preliminary result)   Collection Time: 02/18/20 10:30 AM   Specimen: BLOOD RIGHT HAND  Result Value Ref Range Status   Specimen Description BLOOD RIGHT HAND  Final   Special Requests   Final    BOTTLES DRAWN AEROBIC AND ANAEROBIC Blood Culture adequate volume   Culture  Setup Time   Final    IN BOTH AEROBIC AND ANAEROBIC BOTTLES GRAM NEGATIVE RODS CRITICAL VALUE NOTED.  VALUE IS CONSISTENT WITH PREVIOUSLY REPORTED AND CALLED VALUE.    Culture   Final    NO GROWTH 2 DAYS Performed at [facility], [place] [address] [place], [place] [address]    Report Status PENDING  Incomplete  SARS Coronavirus 2 by RT PCR (hospital order, performed in [facility] hospital lab) Nasopharyngeal Nasopharyngeal Swab     Status: None   Collection Time: 02/18/20  2:12 PM   Specimen: Nasopharyngeal Swab  Result Value Ref Range Status   SARS Coronavirus 2 NEGATIVE NEGATIVE Final    Comment: (NOTE) SARS-CoV-2 target nucleic acids are NOT DETECTED.  The SARS-CoV-2 RNA is generally detectable in upper and lower respiratory specimens during the acute phase of infection. The lowest concentration of SARS-CoV-2 viral copies this assay can detect is 250 copies / mL. A negative result does not preclude SARS-CoV-2 infection and  should not be used as the sole basis for treatment or other patient management decisions.  A negative result may occur with improper specimen collection / handling, submission of specimen other than nasopharyngeal swab, presence of viral mutation(s) within the  areas targeted by this assay, and inadequate number of viral copies (<250 copies / mL). A negative result must be combined with clinical observations, patient history, and epidemiological information.  Fact Sheet for Patients:   StrictlyIdeas.no  Fact Sheet for Healthcare Providers: BankingDealers.co.za  This test is not yet approved or  cleared by the [place] FDA and has been authorized for detection and/or diagnosis of SARS-CoV-2 by FDA under an Emergency Use Authorization (EUA).  This EUA will remain in effect (meaning this test can be used) for the duration of the COVID-19 declaration under Section 564(b)(1) of the Act, 21 U.S.C. section 360bbb-3(b)(1), unless the authorization is terminated or revoked sooner.  Performed at [facility], [place] [address], [place], [place] [address]   Urine culture     Status: None   Collection Time: 02/18/20  2:30 PM   Specimen: Urine, Random  Result Value Ref Range Status   Specimen Description URINE, RANDOM  Final   Special Requests NONE  Final   Culture   Final    NO GROWTH Performed at [facility], [place] [address] [place], [place] [address]    Report Status 02/19/2020 FINAL  Final         Radiology Studies: CT ABDOMEN PELVIS W CONTRAST  Result Date: 02/18/2020 CLINICAL DATA:  Left-sided abdominal pain. EXAM: CT ABDOMEN AND PELVIS WITH CONTRAST TECHNIQUE: Multidetector CT imaging of the abdomen and pelvis was performed using the standard protocol following bolus administration of intravenous contrast. CONTRAST:  126mL OMNIPAQUE IOHEXOL 300 MG/ML  SOLN COMPARISON:  06/05/2016 FINDINGS: Lower chest: Slight  atelectasis at both lung bases posteriorly with slight bronchiectasis at the left lung base. Small hiatal hernia. 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 Cholecystectomy. No liver masses. Pancreas: Unremarkable. No pancreatic ductal dilatation or surrounding inflammatory changes. Spleen: Normal in size without focal abnormality. Adrenals/Urinary Tract: Adrenal glands are unremarkable. Kidneys are normal, without renal calculi, focal lesion, or hydronephrosis. Bladder is unremarkable. Stomach/Bowel: Chronic small hiatal hernia. Stomach is otherwise normal. Small bowel appears normal including the terminal ileum. Appendix is normal. There are few diverticula in the distal descending and proximal sigmoid portions of the colon without evidence of diverticulitis. Vascular/Lymphatic: Aortic atherosclerosis. No enlarged abdominal or pelvic lymph nodes. Reproductive: Chronic 3.6 cm cyst on the right ovary, unchanged. Uterus and left ovary appear normal. Surgical clips in both adnexa consistent with previous tubal ligation. Other: There is a tiny amount of ascites in the right pericolic gutter adjacent to the inferior tip of the right lobe of the liver. No abdominal wall hernia. Musculoskeletal: No acute bone abnormality. Narrowing of the joint space of the left hip. Degenerative disc disease at L5-S1. IMPRESSION: 1. Tiny amount of ascites in the right pericolic gutter adjacent to the inferior tip of the right lobe of the liver. 2. Diverticulosis of the distal descending and proximal sigmoid portions of the colon. 3. 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 4. Chronic small hiatal hernia. 5. Chronic 3.6 cm cyst on the right ovary, unchanged. 6. Aortic atherosclerosis. Aortic Atherosclerosis (ICD10-I70.0). Electronically Signed   By: Lorriane Shire M.D.   On: 02/18/2020 12:20   DG ERCP  Result Date: 02/19/2020 CLINICAL DATA:  64 year old female with a  history of choledocholithiasis EXAM: ERCP TECHNIQUE: Multiple spot images obtained with the fluoroscopic device and submitted for interpretation post-procedure. FLUOROSCOPY TIME:  Fluoroscopy Time: 1 minutes 37 seconds COMPARISON:  MR 02/18/2020 FINDINGS: Limited images of ERCP. Initial image  demonstrates endoscope projecting over the upper abdomen. There is gas within the extrahepatic biliary ductal system. Subsequently there is cannulation of the ampulla with a safety wire and partial opacification of the extrahepatic and intrahepatic biliary ductal system. Filling defects present within the ductal system with subsequently deployment of balloon retrieval catheter. IMPRESSION: Limited images during ERCP demonstrates treatment of choledocholithiasis with balloon retrieval catheter. Please refer to the dictated operative report for full details of intraoperative findings and procedure. Electronically Signed   By: Corrie Mckusick D.O.   On: 02/19/2020 17:12   MR ABDOMEN MRCP W WO CONTAST  Result Date: 02/19/2020 CLINICAL DATA:  Acute abdominal pain and jaundice. EXAM: MRI ABDOMEN WITHOUT AND WITH CONTRAST (INCLUDING MRCP) TECHNIQUE: Multiplanar multisequence MR imaging of the abdomen was performed both before and after the administration of intravenous contrast. Heavily T2-weighted images of the biliary and pancreatic ducts were obtained, and three-dimensional MRCP images were rendered by post processing. CONTRAST:  59mL GADAVIST GADOBUTROL 1 MMOL/ML IV SOLN COMPARISON:  CT scan and ultrasound same date. FINDINGS: Lower chest: Small left pleural effusion and bibasilar atelectasis. Hepatobiliary: Moderate intrahepatic biliary dilatation appears relatively stable. No worrisome hepatic lesions. Dilated common bile duct in the porta hepatis measuring up to 13 mm. Abrupt caliber change and findings consistent with common bile duct stones. The largest measures approximately 6.5 mm. The distal common bile duct near the  ampulla is normal in caliber measuring 3.4 mm. Pancreas:  No mass, inflammation or ductal dilatation. Spleen:  Normal size. No focal lesions. Adrenals/Urinary Tract: The adrenal glands and kidneys are unremarkable. No worrisome renal lesions or hydronephrosis. Stomach/Bowel: Stomach, duodenum, visualized small bowel and visualized colon are unremarkable. Vascular/Lymphatic: The aorta and branch vessels are patent. The major venous structures are patent. No mesenteric or retroperitoneal mass or adenopathy. Other:  Small amount perihepatic ascites. Musculoskeletal: No significant bony findings. IMPRESSION: 1. Stable appearing chronic intra and extrahepatic biliary dilatation. Common bile duct stones are present however and likely causing some degree of obstruction. Recommend ERCP. 2. No abdominal mass lesions or adenopathy. 3. Small amount of free abdominal fluid. 4. Small left pleural effusion and bibasilar atelectasis. These results will be called to the ordering clinician or representative by the Radiologist Assistant, and communication documented in the PACS or [employer]. Electronically Signed   By: Marijo Sanes M.D.   On: 02/19/2020 05:19   US Abdomen Limited RUQ  Result Date: 02/18/2020 CLINICAL DATA:  ELEVATED LIVER ENZYMES.  CHOLECYSTECTOMY. EXAM: ULTRASOUND ABDOMEN LIMITED RIGHT UPPER QUADRANT COMPARISON:  CT SCAN DATED 02/18/2020 FINDINGS: Gallbladder: Removed. Common bile duct: Diameter: 10 mm, within normal limits for a post cholecystectomy patient. No visible mass or stone in the common duct. Liver: No focal lesion identified. There is intrahepatic ductal dilatation as demonstrated on the prior CT scan but this is chronic. Pneumobilia is also demonstrated on this exam and on the prior CT scan. Within normal limits in parenchymal echogenicity. Portal vein is patent on color Doppler imaging with normal direction of blood flow towards the liver. Other: Small amount of ascites. The patient had a  prior ERCP performed on 06/12/2018 which demonstrated the dilated bile ducts. IMPRESSION: No acute abnormality. 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 Electronically Signed   By: Lorriane Shire M.D.   On: 02/18/2020 14:04        Scheduled Meds:  pantoprazole  40 mg Oral Daily   Continuous Infusions:  0.9 % NaCl with KCl 20 mEq / L 100 mL/hr  at 02/20/20 0416   piperacillin-tazobactam            Aline August, MD [facility] 02/20/2020, 8:17 AM

## 2020-02-20 NOTE — Progress Notes (Signed)
Daily Rounding Note  02/20/2020, 8:06 AM  LOS: 2 days   SUBJECTIVE:   Chief complaint:  Choledocholithiasis.   Left abd pain.  Pain in shoulder gone, decreased LLQ pain.  No nausea, tolerating low fat diet.  OBJECTIVE:         Vital signs in last 24 hours:    Temp:  [97.9 F (36.6 C)-99 F (37.2 C)] 98.5 F (36.9 C) (07/01 0416) Pulse Rate:  [71-109] 80 (07/01 0416) Resp:  [14-21] 15 (07/01 0039) BP: (85-111)/(60-75) 90/65 (07/01 0416) SpO2:  [90 %-97 %] 93 % (07/01 0416) Weight:  [62.7 kg] 62.7 kg (07/01 0416) Last BM Date: 02/19/20 Filed Weights   02/18/20 2206 02/19/20 0542 02/20/20 0416  Weight: 63.1 kg 63 kg 62.7 kg   General: looks well, comfortable   Heart: RRR Chest: clear bil.  No SOB or cough Abdomen: soft, ND.  Minor LLQ tenderness.  Active BS  Extremities: no CCE Neuro/Psych:  Alert, fully oriented.    Intake/Output from previous day: 06/30 0701 - 07/01 0700 In: 1810 [P.O.:360; I.V.:1400; IV Piggyback:50] Out: 800 [Urine:800]  Intake/Output this shift: No intake/output data recorded.  Lab Results: Recent Labs    02/18/20 0632 02/19/20 0341 02/20/20 0411  WBC 5.1 12.4* 10.2  HGB 13.3 11.8* 10.5*  HCT 40.7 35.5* 32.0*  PLT 161 142* 126*   BMET Recent Labs    02/18/20 0632 02/19/20 0341 02/20/20 0411  NA 136 140 139  K 3.2* 3.7 4.0  CL 101 107 111  CO2 17* 24 22  GLUCOSE 224* 98 111*  BUN 21 17 15   CREATININE 1.38* 0.91 0.77  CALCIUM 8.8* 8.0* 8.2*   LFT Recent Labs    02/18/20 1008 02/19/20 0341 02/20/20 0411  PROT 6.8 5.3* 5.1*  ALBUMIN 3.6 2.6* 2.3*  AST 2,102* 538* 157*  ALT 1,343* 750* 453*  ALKPHOS 154* 104 91  BILITOT 2.9* 4.8* 1.9*  BILIDIR 1.9*  --   --   IBILI 1.0*  --   --    PT/INR Recent Labs    02/18/20 1057  LABPROT 14.3  INR 1.2   Hepatitis Panel Recent Labs    02/18/20 1220  HEPBSAG NON REACTIVE  HCVAB NON REACTIVE  HEPAIGM NON REACTIVE    HEPBIGM NON REACTIVE    Studies/Results: CT ABDOMEN PELVIS W CONTRAST  Result Date: 02/18/2020 CLINICAL DATA:  Left-sided abdominal pain. EXAM: CT ABDOMEN AND PELVIS WITH CONTRAST TECHNIQUE: Multidetector CT imaging of the abdomen and pelvis was performed using the standard protocol following bolus administration of intravenous contrast. CONTRAST:  143mL OMNIPAQUE IOHEXOL 300 MG/ML  SOLN COMPARISON:  06/05/2016 FINDINGS: Lower chest: Slight atelectasis at both lung bases posteriorly with slight bronchiectasis at the left lung base. Small hiatal hernia. 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 Cholecystectomy. No liver masses. Pancreas: Unremarkable. No pancreatic ductal dilatation or surrounding inflammatory changes. Spleen: Normal in size without focal abnormality. Adrenals/Urinary Tract: Adrenal glands are unremarkable. Kidneys are normal, without renal calculi, focal lesion, or hydronephrosis. Bladder is unremarkable. Stomach/Bowel: Chronic small hiatal hernia. Stomach is otherwise normal. Small bowel appears normal including the terminal ileum. Appendix is normal. There are few diverticula in the distal descending and proximal sigmoid portions of the colon without evidence of diverticulitis. Vascular/Lymphatic: Aortic atherosclerosis. No enlarged abdominal or pelvic lymph nodes. Reproductive: Chronic 3.6 cm cyst on the right ovary, unchanged. Uterus and left ovary appear normal. Surgical clips in both adnexa consistent  with previous tubal ligation. Other: There is a tiny amount of ascites in the right pericolic gutter adjacent to the inferior tip of the right lobe of the liver. No abdominal wall hernia. Musculoskeletal: No acute bone abnormality. Narrowing of the joint space of the left hip. Degenerative disc disease at L5-S1. IMPRESSION: 1. Tiny amount of ascites in the right pericolic gutter adjacent to the inferior tip of the right lobe of the liver. 2.  Diverticulosis of the distal descending and proximal sigmoid portions of the colon. 3. 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 4. Chronic small hiatal hernia. 5. Chronic 3.6 cm cyst on the right ovary, unchanged. 6. Aortic atherosclerosis. Aortic Atherosclerosis (ICD10-I70.0). Electronically Signed   By: Lorriane Shire M.D.   On: 02/18/2020 12:20   DG ERCP  Result Date: 02/19/2020 CLINICAL DATA:  64 year old female with a history of choledocholithiasis EXAM: ERCP TECHNIQUE: Multiple spot images obtained with the fluoroscopic device and submitted for interpretation post-procedure. FLUOROSCOPY TIME:  Fluoroscopy Time: 1 minutes 37 seconds COMPARISON:  MR 02/18/2020 FINDINGS: Limited images of ERCP. Initial image demonstrates endoscope projecting over the upper abdomen. There is gas within the extrahepatic biliary ductal system. Subsequently there is cannulation of the ampulla with a safety wire and partial opacification of the extrahepatic and intrahepatic biliary ductal system. Filling defects present within the ductal system with subsequently deployment of balloon retrieval catheter. IMPRESSION: Limited images during ERCP demonstrates treatment of choledocholithiasis with balloon retrieval catheter. Please refer to the dictated operative report for full details of intraoperative findings and procedure. Electronically Signed   By: Corrie Mckusick D.O.   On: 02/19/2020 17:12   MR ABDOMEN MRCP W WO CONTAST  Result Date: 02/19/2020 CLINICAL DATA:  Acute abdominal pain and jaundice. EXAM: MRI ABDOMEN WITHOUT AND WITH CONTRAST (INCLUDING MRCP) TECHNIQUE: Multiplanar multisequence MR imaging of the abdomen was performed both before and after the administration of intravenous contrast. Heavily T2-weighted images of the biliary and pancreatic ducts were obtained, and three-dimensional MRCP images were rendered by post processing. CONTRAST:  46mL GADAVIST GADOBUTROL 1 MMOL/ML IV SOLN COMPARISON:   CT scan and ultrasound same date. FINDINGS: Lower chest: Small left pleural effusion and bibasilar atelectasis. Hepatobiliary: Moderate intrahepatic biliary dilatation appears relatively stable. No worrisome hepatic lesions. Dilated common bile duct in the porta hepatis measuring up to 13 mm. Abrupt caliber change and findings consistent with common bile duct stones. The largest measures approximately 6.5 mm. The distal common bile duct near the ampulla is normal in caliber measuring 3.4 mm. Pancreas:  No mass, inflammation or ductal dilatation. Spleen:  Normal size. No focal lesions. Adrenals/Urinary Tract: The adrenal glands and kidneys are unremarkable. No worrisome renal lesions or hydronephrosis. Stomach/Bowel: Stomach, duodenum, visualized small bowel and visualized colon are unremarkable. Vascular/Lymphatic: The aorta and branch vessels are patent. The major venous structures are patent. No mesenteric or retroperitoneal mass or adenopathy. Other:  Small amount perihepatic ascites. Musculoskeletal: No significant bony findings. IMPRESSION: 1. Stable appearing chronic intra and extrahepatic biliary dilatation. Common bile duct stones are present however and likely causing some degree of obstruction. Recommend ERCP. 2. No abdominal mass lesions or adenopathy. 3. Small amount of free abdominal fluid. 4. Small left pleural effusion and bibasilar atelectasis. These results will be called to the ordering clinician or representative by the Radiologist Assistant, and communication documented in the PACS or [employer]. Electronically Signed   By: Marijo Sanes M.D.   On: 02/19/2020 05:19   US Abdomen Limited RUQ  Result Date: 02/18/2020 CLINICAL DATA:  ELEVATED LIVER ENZYMES.  CHOLECYSTECTOMY. EXAM: ULTRASOUND ABDOMEN LIMITED RIGHT UPPER QUADRANT COMPARISON:  CT SCAN DATED 02/18/2020 FINDINGS: Gallbladder: Removed. Common bile duct: Diameter: 10 mm, within normal limits for a post cholecystectomy patient. No  visible mass or stone in the common duct. Liver: No focal lesion identified. There is intrahepatic ductal dilatation as demonstrated on the prior CT scan but this is chronic. Pneumobilia is also demonstrated on this exam and on the prior CT scan. Within normal limits in parenchymal echogenicity. Portal vein is patent on color Doppler imaging with normal direction of blood flow towards the liver. Other: Small amount of ascites. The patient had a prior ERCP performed on 06/12/2018 which demonstrated the dilated bile ducts. IMPRESSION: No acute abnormality. 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 Electronically Signed   By: Lorriane Shire M.D.   On: 02/18/2020 14:04   Scheduled Meds: . pantoprazole  40 mg Oral Daily   Continuous Infusions: . 0.9 % NaCl with KCl 20 mEq / L 100 mL/hr at 02/20/20 0416  . piperacillin-tazobactam (ZOSYN)  IV     PRN Meds:.fentaNYL (SUBLIMAZE) injection, ondansetron **OR** ondansetron (ZOFRAN) IV   ASSESMENT:   *   Choledocholithiasis.  Recurrent.  Chronic CBD dilatation.  6/30 ERCP.  CBD 16 mm max diameter.  S/p extension of previously extended sphincterotomy, balloon dilation of distal duct (sphincteroplasty) and stone extraction.  Ascending cholangitis.  Self-limited blood oozing at sphincterotomy did not require intervention.   Growing Klebsiella from blood.  Day 3 Zosyn.    LFTs continue normalizing trend.  Leukocytosis resolved, 12.4 >> 10.2.    *   GERD, controlled w PPI.    *   Thrombocytopenia.  Non-critical.     PLAN   *  Total 7 (vs 10) days of abx.  Continue Zosyn of now.   Await abx sensitivities, pndg at present but likely could transition to Augmentin 875 mg bid at discharge.  GI fup (Cirigl[facility]) w LFT recheck in ~ 1 month.  Office will call w appt From GI standpoint, ok for discharge today.  Will sign off.      Azucena Freed  02/20/2020, 8:06 AM Phone [phone]

## 2020-02-21 ENCOUNTER — Encounter (HOSPITAL_COMMUNITY): Payer: Self-pay

## 2020-02-21 ENCOUNTER — Telehealth: Payer: Self-pay | Admitting: Gastroenterology

## 2020-02-21 LAB — COMPREHENSIVE METABOLIC PANEL
ALT: 287 U/L — ABNORMAL HIGH (ref 0–44)
AST: 57 U/L — ABNORMAL HIGH (ref 15–41)
Albumin: 2.3 g/dL — ABNORMAL LOW (ref 3.5–5.0)
Alkaline Phosphatase: 89 U/L (ref 38–126)
Anion gap: 8 (ref 5–15)
BUN: 14 mg/dL (ref 8–23)
CO2: 24 mmol/L (ref 22–32)
Calcium: 8 mg/dL — ABNORMAL LOW (ref 8.9–10.3)
Chloride: 109 mmol/L (ref 98–111)
Creatinine, Ser: 0.87 mg/dL (ref 0.44–1.00)
GFR calc Af Amer: >60 mL/min (ref >60)
GFR calc non Af Amer: >60 mL/min (ref >60)
Glucose, Bld: 102 mg/dL — ABNORMAL HIGH (ref 70–99)
Potassium: 3.5 mmol/L (ref 3.5–5.1)
Sodium: 141 mmol/L (ref 135–145)
Total Bilirubin: 1.3 mg/dL — ABNORMAL HIGH (ref 0.3–1.2)
Total Protein: 5.2 g/dL — ABNORMAL LOW (ref 6.5–8.1)

## 2020-02-21 LAB — CULTURE, BLOOD (ROUTINE X 2)
Special Requests: ADEQUATE
Special Requests: ADEQUATE

## 2020-02-21 LAB — CBC WITH DIFFERENTIAL/PLATELET
Abs Immature Granulocytes: 0.06 10*3/uL (ref 0.00–0.07)
Basophils Absolute: 0 10*3/uL (ref 0.0–0.1)
Basophils Relative: 0 %
Eosinophils Absolute: 0 10*3/uL (ref 0.0–0.5)
Eosinophils Relative: 0 %
HCT: 30.9 % — ABNORMAL LOW (ref 36.0–46.0)
Hemoglobin: 10.3 g/dL — ABNORMAL LOW (ref 12.0–15.0)
Immature Granulocytes: 1 %
Lymphocytes Relative: 10 %
Lymphs Abs: 1.2 10*3/uL (ref 0.7–4.0)
MCH: 28.4 pg (ref 26.0–34.0)
MCHC: 33.3 g/dL (ref 30.0–36.0)
MCV: 85.1 fL (ref 80.0–100.0)
Monocytes Absolute: 0.4 10*3/uL (ref 0.1–1.0)
Monocytes Relative: 3 %
Neutro Abs: 9.7 10*3/uL — ABNORMAL HIGH (ref 1.7–7.7)
Neutrophils Relative %: 86 %
Platelets: 150 10*3/uL (ref 150–400)
RBC: 3.63 MIL/uL — ABNORMAL LOW (ref 3.87–5.11)
RDW: 12.9 % (ref 11.5–15.5)
WBC: 11.3 10*3/uL — ABNORMAL HIGH (ref 4.0–10.5)
nRBC: 0 % (ref 0.0–0.2)

## 2020-02-21 LAB — MAGNESIUM: Magnesium: 1.8 mg/dL (ref 1.7–2.4)

## 2020-02-21 MED ORDER — OXYCODONE HCL 5 MG PO TABS: 5.0000 mg | ORAL_TABLET | Freq: Four times a day (QID) | ORAL | 0 refills | Status: DC | PRN

## 2020-02-21 MED ORDER — CEPHALEXIN 500 MG PO CAPS: 500.0000 mg | ORAL_CAPSULE | Freq: Three times a day (TID) | ORAL | 0 refills | Status: DC

## 2020-02-21 MED ORDER — ONDANSETRON HCL 4 MG PO TABS: 4.0000 mg | ORAL_TABLET | Freq: Four times a day (QID) | ORAL | 0 refills | Status: DC | PRN

## 2020-02-21 NOTE — Discharge Summary (Signed)
Physician Discharge Summary  Jasmine Bradford YOV:[id number] DOB: 1956-05-29 DOA: 02/18/2020  PCP: Ronnald Nian, DO  Admit date: 02/18/2020 Discharge date: 02/21/2020  Admitted From: Home Disposition: Home  Recommendations for Outpatient Follow-up:  1. Follow up with PCP in 1 week with repeat CBC/CMP 2. Outpatient follow-up with GI 3. Follow up in ED if symptoms worsen or new appear   Home Health: No Equipment/Devices: None  Discharge Condition: Stable CODE STATUS: Full Diet recommendation: Heart healthy  Brief/Interim Summary: 64 year old female with history of recurrent choledocholithiasis status post cholecystectomy, and GERD, seasonal allergies presented on 02/18/2020 with worsening abdominal pain.  In the ED, she had elevated lactic acid, AKI, AST 2102, ALT 1343, total bilirubin of 2.9.  CT abdomen/pelvis showed tiny amount of ascites, diverticulosis of distal descending and proximal sigmoid colon, chronic intra and extrahepatic biliary dilation, chronic small hiatal hernia, 3.6 cm cyst in right ovary.  Right upper quadrant ultrasound showed no acute abnormality except for chronic biliary ductal dilatation.  She was started on IV fluids and antibiotics.  GI was consulted.  She was found to have Klebsiella pneumoniae bacteremia.  She underwent ERCP and stone extraction by GI on 02/19/2020.  Subsequently, her LFTs are improving and she is tolerating diet.  She will be discharged on oral Keflex for 5 more days.  Outpatient follow-up with GI.  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 -Status post ERCP with extension of  biliary sphincterotomy, sphincteroplasty followed by stone extraction done by GI on 02/19/2020.  Patient is tolerating advanced diet although appetite is poor.  No fever and current hemodynamically stable. -Currently on Zosyn for Klebsiella pneumonia bacteremia.  Switch to oral Keflex for 5 more days and discharge the patient today.  GI has cleared the patient for discharge.  Outpatient follow-up with GI.  Follow CMP as an outpatient within a week.  Leukocytosis -Mild.    Acute kidney injury Metabolic acidosis -Improving.    Off IV fluids.  Acidosis is resolved  Hypokalemia -Improved.  Resolved.  Thrombocytopenia -Resolved.  Hypotension -Blood pressure still on the lower side.  Patient states that her blood pressure normally runs pretty low.    Of IV fluids.    Outpatient follow-up  GERD -Continue PPI    Discharge Instructions  Discharge Instructions    Diet - low sodium heart healthy   Complete by: As directed    Increase activity slowly   Complete by: As directed      Allergies as of 02/21/2020      Reactions   Oxycodone Nausea And Vomiting, Other (See Comments)   Also made patient lightheaded and lethargic      Medication List    TAKE these medications   cephALEXin 500 MG capsule Commonly known as: KEFLEX Take 1 capsule (500 mg total) by mouth 3 (three) times daily for 5 days.   cetirizine 10 MG tablet Commonly known as: ZYRTEC Take 10 mg by mouth daily as needed for allergies.   ibuprofen 200 MG tablet Commonly known as: ADVIL Take 200 mg by mouth every 6 (six) hours as needed for headache or moderate pain.   multivitamin with minerals Tabs tablet Take 1 tablet by mouth daily with lunch.  omeprazole 40 MG capsule Commonly known as: PRILOSEC Take 1 capsule (40 mg total) by mouth daily.   ondansetron 4 MG tablet Commonly known as: ZOFRAN Take 1 tablet (4 mg total) by mouth every 6 (six) hours as needed for nausea.   oxyCODONE 5 MG immediate release  tablet Commonly known as: Oxy IR/ROXICODONE Take 1 tablet (5 mg total) by mouth every 6 (six) hours as needed for severe pain.   Theratears 0.25 % Soln Generic drug: Carboxymethylcellulose Sodium Place 1 drop into both eyes daily.       Follow-up Information    Gerrit Heck V, DO Follow up on 03/24/2020.   Specialty: Gastroenterology Why: 10 AM follow up appt.   Contact information: [place] [address] [phone]        Ronnald Nian, DO. Schedule an appointment as soon as possible for a visit in 1 week(s).   Specialty: Family Medicine Why: With repeat CBC/CMP Contact information: [place] [address] [phone]              Allergies  Allergen Reactions  . Oxycodone Nausea And Vomiting and Other (See Comments)    Also made patient lightheaded and lethargic    Consultations:  GI   Procedures/Studies: DG Chest 2 View  Result Date: 02/18/2020 CLINICAL DATA:  Chest pain. EXAM: CHEST - 2 VIEW COMPARISON:  03/03/2016. FINDINGS: Mediastinum hilar structures normal. Heart size normal. Low lung volumes. Mild bibasilar atelectasis/infiltrates. No pleural effusion or pneumothorax. Mild thoracic spine scoliosis. IMPRESSION: Low lung volumes with mild bibasilar atelectasis/infiltrates. Electronically Signed   By: Marcello Moores  Register   On: 02/18/2020 07:21   CT ABDOMEN PELVIS W CONTRAST  Result Date: 02/18/2020 CLINICAL DATA:  Left-sided abdominal pain. EXAM: CT ABDOMEN AND PELVIS WITH CONTRAST TECHNIQUE: Multidetector CT imaging of the abdomen and pelvis was performed using the standard protocol following bolus administration of intravenous contrast. CONTRAST:  165mL OMNIPAQUE IOHEXOL 300 MG/ML  SOLN COMPARISON:  06/05/2016 FINDINGS: Lower chest: Slight atelectasis at both lung bases posteriorly with slight bronchiectasis at the left lung base. Small hiatal hernia. Hepatobiliary: Chronic intra and  extrahepatic biliary ductal dilatation, stable. Cholecystectomy. No liver masses. Pancreas: Unremarkable. No pancreatic ductal dilatation or surrounding inflammatory changes. Spleen: Normal in size without focal abnormality. Adrenals/Urinary Tract: Adrenal glands are unremarkable. Kidneys are normal, without renal calculi, focal lesion, or hydronephrosis. Bladder is unremarkable. Stomach/Bowel: Chronic small hiatal hernia. Stomach is otherwise normal. Small bowel appears normal including the terminal ileum. Appendix is normal. There are few diverticula in the distal descending and proximal sigmoid portions of the colon without evidence of diverticulitis. Vascular/Lymphatic: Aortic atherosclerosis. No enlarged abdominal or pelvic lymph nodes. Reproductive: Chronic 3.6 cm cyst on the right ovary, unchanged. Uterus and left ovary appear normal. Surgical clips in both adnexa consistent with previous tubal ligation. Other: There is a tiny amount of ascites in the right pericolic gutter adjacent to the inferior tip of the right lobe of the liver. No abdominal wall hernia. Musculoskeletal: No acute bone abnormality. Narrowing of the joint space of the left hip. Degenerative disc disease at L5-S1. IMPRESSION: 1. Tiny amount of ascites in the right pericolic gutter adjacent to the inferior tip of the right lobe of the liver. 2. Diverticulosis of the distal descending and proximal sigmoid portions of the colon. 3. 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 4. Chronic small hiatal hernia. 5. Chronic 3.6 cm cyst on the right ovary, unchanged. 6.  Aortic atherosclerosis. Aortic Atherosclerosis (ICD10-I70.0). Electronically Signed   By: Lorriane Shire M.D.   On: 02/18/2020 12:20   DG ERCP  Result Date: 02/19/2020 CLINICAL DATA:  64 year old female with a history of choledocholithiasis EXAM: ERCP TECHNIQUE: Multiple spot images obtained with the fluoroscopic device and submitted for interpretation  post-procedure. FLUOROSCOPY TIME:  Fluoroscopy Time: 1 minutes 37 seconds COMPARISON:  MR 02/18/2020 FINDINGS: Limited images of ERCP. Initial image demonstrates endoscope projecting over the upper abdomen. There is gas within the extrahepatic biliary ductal system. Subsequently there is cannulation of the ampulla with a safety wire and partial opacification of the extrahepatic and intrahepatic biliary ductal system. Filling defects present within the ductal system with subsequently deployment of balloon retrieval catheter. IMPRESSION: Limited images during ERCP demonstrates treatment of choledocholithiasis with balloon retrieval catheter. Please refer to the dictated operative report for full details of intraoperative findings and procedure. Electronically Signed   By: Corrie Mckusick D.O.   On: 02/19/2020 17:12   MR ABDOMEN MRCP W WO CONTAST  Result Date: 02/19/2020 CLINICAL DATA:  Acute abdominal pain and jaundice. EXAM: MRI ABDOMEN WITHOUT AND WITH CONTRAST (INCLUDING MRCP) TECHNIQUE: Multiplanar multisequence MR imaging of the abdomen was performed both before and after the administration of intravenous contrast. Heavily T2-weighted images of the biliary and pancreatic ducts were obtained, and three-dimensional MRCP images were rendered by post processing. CONTRAST:  28mL GADAVIST GADOBUTROL 1 MMOL/ML IV SOLN COMPARISON:  CT scan and ultrasound same date. FINDINGS: Lower chest: Small left pleural effusion and bibasilar atelectasis. Hepatobiliary: Moderate intrahepatic biliary dilatation appears relatively stable. No worrisome hepatic lesions. Dilated common bile duct in the porta hepatis measuring up to 13 mm. Abrupt caliber change and findings consistent with common bile duct stones. The largest measures approximately 6.5 mm. The distal common bile duct near the ampulla is normal in caliber measuring 3.4 mm. Pancreas:  No mass, inflammation or ductal dilatation. Spleen:  Normal size. No focal lesions.  Adrenals/Urinary Tract: The adrenal glands and kidneys are unremarkable. No worrisome renal lesions or hydronephrosis. Stomach/Bowel: Stomach, duodenum, visualized small bowel and visualized colon are unremarkable. Vascular/Lymphatic: The aorta and branch vessels are patent. The major venous structures are patent. No mesenteric or retroperitoneal mass or adenopathy. Other:  Small amount perihepatic ascites. Musculoskeletal: No significant bony findings. IMPRESSION: 1. Stable appearing chronic intra and extrahepatic biliary dilatation. Common bile duct stones are present however and likely causing some degree of obstruction. Recommend ERCP. 2. No abdominal mass lesions or adenopathy. 3. Small amount of free abdominal fluid. 4. Small left pleural effusion and bibasilar atelectasis. These results will be called to the ordering clinician or representative by the Radiologist Assistant, and communication documented in the PACS or [employer]. Electronically Signed   By: Marijo Sanes M.D.   On: 02/19/2020 05:19   US BREAST LTD UNI LEFT INC AXILLA  Result Date: 01/22/2020 CLINICAL DATA:  Screening recall for a possible right breast asymmetry and possible left axillary mass. This for from the screening study dated 11/04/2019. There were no previous mammograms available for comparison to that study. EXAM: DIGITAL DIAGNOSTIC BILATERAL MAMMOGRAM WITH CAD AND TOMO ULTRASOUND BILATERAL BREAST COMPARISON:  Screening study dated 11/04/2019 ACR Breast Density Category c: The breast tissue is heterogeneously dense, which may obscure small masses. FINDINGS: In the right breast, the possible mass seen medially persists as a small oval circumscribed mass, middle depth. There are no other right breast masses, no areas of architectural distortion and no suspicious calcifications. In the left  breast, the possible axillary mass is no longer visualized, probably due to an area of fibroglandular tissue superimposed on the pectoralis  muscle on the screening study. There are 2 or 3 small retroareolar masses that are evident on the tomosynthesis images, as well as a peripherally calcified small mass in the immediate retroareolar left breast. There are no areas of architectural distortion and no suspicious calcifications. Mammographic images were processed with CAD. Targeted right breast ultrasound is performed, showing a simple cyst in the 3 o'clock position, retroareolar, middle depth, measuring 6 x 3 x 6 mm, consistent in size, shape and location to the mammographic mass. Targeted left breast ultrasound is performed, showing 2 adjacent retroareolar cysts, 1 with peripheral calcification, corresponding to the peripherally calcified retroareolar mass noted mammographically. In the deeper aspect of the medial, central left breast, there is a hypoechoic round mass, with no internal blood flow on color Doppler analysis, measuring 5 x 4 x 5 mm, most likely a mildly complicated cyst. IMPRESSION: 1. Small apparent masses in the retroareolar left breast, 1 of which is in the central medial aspect of the left breast. This latter mammographic lesion likely corresponds to the small, 5 mm, ultrasound mass and is likely a mildly complicated cyst. The left breast findings are probably benign and short-term follow-up is recommended. 2. Small benign right breast cyst. RECOMMENDATION: Diagnostic left breast mammography and ultrasound in 6 months. I have discussed the findings and recommendations with the patient. If applicable, a reminder letter will be sent to the patient regarding the next appointment. BI-RADS CATEGORY  3: Probably benign. Electronically Signed   By: Lajean Manes M.D.   On: 01/22/2020 16:21   US BREAST LTD UNI RIGHT INC AXILLA  Result Date: 01/22/2020 CLINICAL DATA:  Screening recall for a possible right breast asymmetry and possible left axillary mass. This for from the screening study dated 11/04/2019. There were no previous mammograms  available for comparison to that study. EXAM: DIGITAL DIAGNOSTIC BILATERAL MAMMOGRAM WITH CAD AND TOMO ULTRASOUND BILATERAL BREAST COMPARISON:  Screening study dated 11/04/2019 ACR Breast Density Category c: The breast tissue is heterogeneously dense, which may obscure small masses. FINDINGS: In the right breast, the possible mass seen medially persists as a small oval circumscribed mass, middle depth. There are no other right breast masses, no areas of architectural distortion and no suspicious calcifications. In the left breast, the possible axillary mass is no longer visualized, probably due to an area of fibroglandular tissue superimposed on the pectoralis muscle on the screening study. There are 2 or 3 small retroareolar masses that are evident on the tomosynthesis images, as well as a peripherally calcified small mass in the immediate retroareolar left breast. There are no areas of architectural distortion and no suspicious calcifications. Mammographic images were processed with CAD. Targeted right breast ultrasound is performed, showing a simple cyst in the 3 o'clock position, retroareolar, middle depth, measuring 6 x 3 x 6 mm, consistent in size, shape and location to the mammographic mass. Targeted left breast ultrasound is performed, showing 2 adjacent retroareolar cysts, 1 with peripheral calcification, corresponding to the peripherally calcified retroareolar mass noted mammographically. In the deeper aspect of the medial, central left breast, there is a hypoechoic round mass, with no internal blood flow on color Doppler analysis, measuring 5 x 4 x 5 mm, most likely a mildly complicated cyst. IMPRESSION: 1. Small apparent masses in the retroareolar left breast, 1 of which is in the central medial aspect of the left breast. This  latter mammographic lesion likely corresponds to the small, 5 mm, ultrasound mass and is likely a mildly complicated cyst. The left breast findings are probably benign and  short-term follow-up is recommended. 2. Small benign right breast cyst. RECOMMENDATION: Diagnostic left breast mammography and ultrasound in 6 months. I have discussed the findings and recommendations with the patient. If applicable, a reminder letter will be sent to the patient regarding the next appointment. BI-RADS CATEGORY  3: Probably benign. Electronically Signed   By: Lajean Manes M.D.   On: 01/22/2020 16:21   MM DIAG BREAST TOMO BILATERAL  Result Date: 01/22/2020 CLINICAL DATA:  Screening recall for a possible right breast asymmetry and possible left axillary mass. This for from the screening study dated 11/04/2019. There were no previous mammograms available for comparison to that study. EXAM: DIGITAL DIAGNOSTIC BILATERAL MAMMOGRAM WITH CAD AND TOMO ULTRASOUND BILATERAL BREAST COMPARISON:  Screening study dated 11/04/2019 ACR Breast Density Category c: The breast tissue is heterogeneously dense, which may obscure small masses. FINDINGS: In the right breast, the possible mass seen medially persists as a small oval circumscribed mass, middle depth. There are no other right breast masses, no areas of architectural distortion and no suspicious calcifications. In the left breast, the possible axillary mass is no longer visualized, probably due to an area of fibroglandular tissue superimposed on the pectoralis muscle on the screening study. There are 2 or 3 small retroareolar masses that are evident on the tomosynthesis images, as well as a peripherally calcified small mass in the immediate retroareolar left breast. There are no areas of architectural distortion and no suspicious calcifications. Mammographic images were processed with CAD. Targeted right breast ultrasound is performed, showing a simple cyst in the 3 o'clock position, retroareolar, middle depth, measuring 6 x 3 x 6 mm, consistent in size, shape and location to the mammographic mass. Targeted left breast ultrasound is performed, showing 2  adjacent retroareolar cysts, 1 with peripheral calcification, corresponding to the peripherally calcified retroareolar mass noted mammographically. In the deeper aspect of the medial, central left breast, there is a hypoechoic round mass, with no internal blood flow on color Doppler analysis, measuring 5 x 4 x 5 mm, most likely a mildly complicated cyst. IMPRESSION: 1. Small apparent masses in the retroareolar left breast, 1 of which is in the central medial aspect of the left breast. This latter mammographic lesion likely corresponds to the small, 5 mm, ultrasound mass and is likely a mildly complicated cyst. The left breast findings are probably benign and short-term follow-up is recommended. 2. Small benign right breast cyst. RECOMMENDATION: Diagnostic left breast mammography and ultrasound in 6 months. I have discussed the findings and recommendations with the patient. If applicable, a reminder letter will be sent to the patient regarding the next appointment. BI-RADS CATEGORY  3: Probably benign. Electronically Signed   By: Lajean Manes M.D.   On: 01/22/2020 16:21   US Abdomen Limited RUQ  Result Date: 02/18/2020 CLINICAL DATA:  ELEVATED LIVER ENZYMES.  CHOLECYSTECTOMY. EXAM: ULTRASOUND ABDOMEN LIMITED RIGHT UPPER QUADRANT COMPARISON:  CT SCAN DATED 02/18/2020 FINDINGS: Gallbladder: Removed. Common bile duct: Diameter: 10 mm, within normal limits for a post cholecystectomy patient. No visible mass or stone in the common duct. Liver: No focal lesion identified. There is intrahepatic ductal dilatation as demonstrated on the prior CT scan but this is chronic. Pneumobilia is also demonstrated on this exam and on the prior CT scan. Within normal limits in parenchymal echogenicity. Portal vein is patent on color  Doppler imaging with normal direction of blood flow towards the liver. Other: Small amount of ascites. The patient had a prior ERCP performed on 06/12/2018 which demonstrated the dilated bile ducts.  IMPRESSION: No acute abnormality. 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 Electronically Signed   By: Lorriane Shire M.D.   On: 02/18/2020 14:04     ERCP with extension of biliary sphincterotomy, sphincteroplasty followed by stone extraction done by GI on 02/19/2020  Subjective: Patient seen and examined at bedside.  She states that she felt bloated yesterday after trying solid diet.  Denies any vomiting.  No fevers.  Feels okay to go home.  Still has intermittent mild abdominal pain.  Discharge Exam: Vitals:   02/21/20 0102 02/21/20 0553  BP: 97/62 91/65  Pulse: 85 90  Resp: 17 20  Temp: 99.1 F (37.3 C) 99 F (37.2 C)  SpO2: 92% 90%    General: Pt is alert, awake, not in acute distress Cardiovascular: rate controlled, S1/S2 + Respiratory: bilateral decreased breath sounds at bases Abdominal: Soft, mildly tender in the right upper quadrant, ND, bowel sounds + Extremities: no edema, no cyanosis    The results of significant diagnostics from this hospitalization (including imaging, microbiology, ancillary and laboratory) are listed below for reference.     Microbiology: Recent Results (from the past 240 hour(s))  Blood culture (routine x 2)     Status: Abnormal   Collection Time: 02/18/20 10:18 AM   Specimen: BLOOD  Result Value Ref Range Status   Specimen Description BLOOD RIGHT ANTECUBITAL  Final   Special Requests   Final    BOTTLES DRAWN AEROBIC AND ANAEROBIC Blood Culture adequate volume   Culture  Setup Time   Final    GRAM NEGATIVE RODS IN BOTH AEROBIC AND ANAEROBIC BOTTLES CRITICAL RESULT CALLED TO, READ BACK BY AND VERIFIED WITH: PHARMD LAURA SEAY AT 4081 BY MESSAN H. ON 02/19/2020 Performed at [facility], [place] [address] [place],  [address]    Culture KLEBSIELLA PNEUMONIAE (A)  Final   Report Status 02/21/2020 FINAL  Final   Organism ID, Bacteria KLEBSIELLA PNEUMONIAE  Final      Susceptibility   Klebsiella pneumoniae  - MIC*    AMPICILLIN >=32 RESISTANT Resistant     CEFAZOLIN <=4 SENSITIVE Sensitive     CEFEPIME <=0.12 SENSITIVE Sensitive     CEFTAZIDIME <=1 SENSITIVE Sensitive     CEFTRIAXONE <=0.25 SENSITIVE Sensitive     CIPROFLOXACIN <=0.25 SENSITIVE Sensitive     GENTAMICIN <=1 SENSITIVE Sensitive     IMIPENEM 1 SENSITIVE Sensitive     TRIMETH/SULFA <=20 SENSITIVE Sensitive     AMPICILLIN/SULBACTAM 16 INTERMEDIATE Intermediate     PIP/TAZO <=4 SENSITIVE Sensitive     * KLEBSIELLA PNEUMONIAE  Blood Culture ID Panel (Reflexed)     Status: Abnormal   Collection Time: 02/18/20 10:18 AM  Result Value Ref Range Status   Enterococcus species NOT DETECTED NOT DETECTED Final   Listeria monocytogenes NOT DETECTED NOT DETECTED Final   Staphylococcus species NOT DETECTED NOT DETECTED Final   Staphylococcus aureus (BCID) NOT DETECTED NOT DETECTED Final   Streptococcus species NOT DETECTED NOT DETECTED Final   Streptococcus agalactiae NOT DETECTED NOT DETECTED Final   Streptococcus pneumoniae NOT DETECTED NOT DETECTED Final   Streptococcus pyogenes NOT DETECTED NOT DETECTED Final   Acinetobacter baumannii NOT DETECTED NOT DETECTED Final   Enterobacteriaceae species DETECTED (A) NOT DETECTED Final    Comment: Enterobacteriaceae represent a large family  of gram-negative bacteria, not a single organism. CRITICAL RESULT CALLED TO, READ BACK BY AND VERIFIED WITH: PHARMD LAURA SEAY AT 0255 BY MESSAN H ON 02/19/2020    Enterobacter cloacae complex NOT DETECTED NOT DETECTED Final   Escherichia coli NOT DETECTED NOT DETECTED Final   Klebsiella oxytoca NOT DETECTED NOT DETECTED Final   Klebsiella pneumoniae DETECTED (A) NOT DETECTED Final    Comment: CRITICAL RESULT CALLED TO, READ BACK BY AND VERIFIED WITH: PHARMD LAURA SEAY AT 0255 BY MESSAN H ON 02/19/2020    Proteus species NOT DETECTED NOT DETECTED Final   Serratia marcescens NOT DETECTED NOT DETECTED Final   Carbapenem resistance NOT DETECTED NOT DETECTED  Final   Haemophilus influenzae NOT DETECTED NOT DETECTED Final   Neisseria meningitidis NOT DETECTED NOT DETECTED Final   Pseudomonas aeruginosa NOT DETECTED NOT DETECTED Final   Candida albicans NOT DETECTED NOT DETECTED Final   Candida glabrata NOT DETECTED NOT DETECTED Final   Candida krusei NOT DETECTED NOT DETECTED Final   Candida parapsilosis NOT DETECTED NOT DETECTED Final   Candida tropicalis NOT DETECTED NOT DETECTED Final    Comment: Performed at [facility], [place] [address], [place], [place] [address]  Blood culture (routine x 2)     Status: Abnormal   Collection Time: 02/18/20 10:30 AM   Specimen: BLOOD RIGHT HAND  Result Value Ref Range Status   Specimen Description BLOOD RIGHT HAND  Final   Special Requests   Final    BOTTLES DRAWN AEROBIC AND ANAEROBIC Blood Culture adequate volume   Culture  Setup Time   Final    IN BOTH AEROBIC AND ANAEROBIC BOTTLES GRAM NEGATIVE RODS CRITICAL VALUE NOTED.  VALUE IS CONSISTENT WITH PREVIOUSLY REPORTED AND CALLED VALUE.    Culture (A)  Final    KLEBSIELLA PNEUMONIAE SUSCEPTIBILITIES PERFORMED ON PREVIOUS CULTURE WITHIN THE LAST 5 DAYS. Performed at [facility], [place] [address], [place], [place] [address]    Report Status 02/21/2020 FINAL  Final  SARS Coronavirus 2 by RT PCR (hospital order, performed in [facility] hospital lab) Nasopharyngeal Nasopharyngeal Swab     Status: None   Collection Time: 02/18/20  2:12 PM   Specimen: Nasopharyngeal Swab  Result Value Ref Range Status   SARS Coronavirus 2 NEGATIVE NEGATIVE Final    Comment: (NOTE) SARS-CoV-2 target nucleic acids are NOT DETECTED.  The SARS-CoV-2 RNA is generally detectable in upper and lower respiratory specimens during the acute phase of infection. The lowest concentration of SARS-CoV-2 viral copies this assay can detect is 250 copies / mL. A negative result does not preclude SARS-CoV-2 infection and should not be used as the sole basis for treatment or  other patient management decisions.  A negative result may occur with improper specimen collection / handling, submission of specimen other than nasopharyngeal swab, presence of viral mutation(s) within the areas targeted by this assay, and inadequate number of viral copies (<250 copies / mL). A negative result must be combined with clinical observations, patient history, and epidemiological information.  Fact Sheet for Patients:   StrictlyIdeas.no  Fact Sheet for Healthcare Providers: BankingDealers.co.za  This test is not yet approved or  cleared by the [place] FDA and has been authorized for detection and/or diagnosis of SARS-CoV-2 by FDA under an Emergency Use Authorization (EUA).  This EUA will remain in effect (meaning this test can be used) for the duration of the COVID-19 declaration under Section 564(b)(1) of the Act, 21 U.S.C. section 360bbb-3(b)(1), unless  the authorization is terminated or revoked sooner.  Performed at [facility], [place] [address] [place], [place] [address]   Urine culture     Status: None   Collection Time: 02/18/20  2:30 PM   Specimen: Urine, Random  Result Value Ref Range Status   Specimen Description URINE, RANDOM  Final   Special Requests NONE  Final   Culture   Final    NO GROWTH Performed at [facility], [place] [address], [place],  [address]    Report Status 02/19/2020 FINAL  Final     Labs: BNP (last 3 results) No results for input(s): BNP in the last 8760 hours. Basic Metabolic Panel: Recent Labs  Lab 02/18/20 0632 02/18/20 1530 02/19/20 0341 02/20/20 0411 02/21/20 0241  NA 136  --  140 139 141  K 3.2*  --  3.7 4.0 3.5  CL 101  --  107 111 109  CO2 17*  --  24 22 24   GLUCOSE 224*  --  98 111* 102*  BUN 21  --  17 15 14   CREATININE 1.38*  --  0.91 0.77 0.87  CALCIUM 8.8*  --  8.0* 8.2* 8.0*  MG  --  1.6*  --  2.1 1.8  PHOS  --  4.4  --   --   --     Liver Function Tests: Recent Labs  Lab 02/18/20 1008 02/19/20 0341 02/20/20 0411 02/21/20 0241  AST 2,102* 538* 157* 57*  ALT 1,343* 750* 453* 287*  ALKPHOS 154* 104 91 89  BILITOT 2.9* 4.8* 1.9* 1.3*  PROT 6.8 5.3* 5.1* 5.2*  ALBUMIN 3.6 2.6* 2.3* 2.3*   Recent Labs  Lab 02/18/20 1008  LIPASE 21   Recent Labs  Lab 02/18/20 1530  AMMONIA 28   CBC: Recent Labs  Lab 02/18/20 0632 02/19/20 0341 02/20/20 0411 02/21/20 0241  WBC 5.1 12.4* 10.2 11.3*  NEUTROABS  --   --  8.4* 9.7*  HGB 13.3 11.8* 10.5* 10.3*  HCT 40.7 35.5* 32.0* 30.9*  MCV 85.3 86.0 85.6 85.1  PLT 161 142* 126* 150   Cardiac Enzymes: No results for input(s): CKTOTAL, CKMB, CKMBINDEX, TROPONINI in the last 168 hours. BNP: Invalid input(s): POCBNP CBG: No results for input(s): GLUCAP in the last 168 hours. D-Dimer No results for input(s): DDIMER in the last 72 hours. Hgb A1c No results for input(s): HGBA1C in the last 72 hours. Lipid Profile Recent Labs    02/18/20 1530  CHOL 196  HDL 52  LDLCALC 132*  TRIG 60  CHOLHDL 3.8   Thyroid function studies Recent Labs    02/18/20 1530  TSH 0.764   Anemia work up No results for input(s): VITAMINB12, FOLATE, FERRITIN, TIBC, IRON, RETICCTPCT in the last 72 hours. Urinalysis    Component Value Date/Time   COLORURINE AMBER (A) 02/18/2020 1430   APPEARANCEUR CLEAR 02/18/2020 1430   LABSPEC >1.046 (H) 02/18/2020 1430   PHURINE 5.0 02/18/2020 1430   GLUCOSEU NEGATIVE 02/18/2020 1430   HGBUR SMALL (A) 02/18/2020 1430   BILIRUBINUR NEGATIVE 02/18/2020 1430   KETONESUR NEGATIVE 02/18/2020 1430   PROTEINUR NEGATIVE 02/18/2020 1430   NITRITE NEGATIVE 02/18/2020 1430   LEUKOCYTESUR TRACE (A) 02/18/2020 1430   Sepsis Labs Invalid input(s): PROCALCITONIN,  WBC,  LACTICIDVEN Microbiology Recent Results (from the past 240 hour(s))  Blood culture (routine x 2)     Status: Abnormal   Collection Time: 02/18/20 10:18 AM   Specimen: BLOOD   Result Value  Ref Range Status   Specimen Description BLOOD RIGHT ANTECUBITAL  Final   Special Requests   Final    BOTTLES DRAWN AEROBIC AND ANAEROBIC Blood Culture adequate volume   Culture  Setup Time   Final    GRAM NEGATIVE RODS IN BOTH AEROBIC AND ANAEROBIC BOTTLES CRITICAL RESULT CALLED TO, READ BACK BY AND VERIFIED WITH: PHARMD LAURA SEAY AT [place] BY MESSAN H. ON 02/19/2020 Performed at [facility], [place] [address] [place], [place] [address]    Culture KLEBSIELLA PNEUMONIAE (A)  Final   Report Status 02/21/2020 FINAL  Final   Organism ID, Bacteria KLEBSIELLA PNEUMONIAE  Final      Susceptibility   Klebsiella pneumoniae - MIC*    AMPICILLIN >=32 RESISTANT Resistant     CEFAZOLIN <=4 SENSITIVE Sensitive     CEFEPIME <=0.12 SENSITIVE Sensitive     CEFTAZIDIME <=1 SENSITIVE Sensitive     CEFTRIAXONE <=0.25 SENSITIVE Sensitive     CIPROFLOXACIN <=0.25 SENSITIVE Sensitive     GENTAMICIN <=1 SENSITIVE Sensitive     IMIPENEM 1 SENSITIVE Sensitive     TRIMETH/SULFA <=20 SENSITIVE Sensitive     AMPICILLIN/SULBACTAM 16 INTERMEDIATE Intermediate     PIP/TAZO <=4 SENSITIVE Sensitive     * KLEBSIELLA PNEUMONIAE  Blood Culture ID Panel (Reflexed)     Status: Abnormal   Collection Time: 02/18/20 10:18 AM  Result Value Ref Range Status   Enterococcus species NOT DETECTED NOT DETECTED Final   Listeria monocytogenes NOT DETECTED NOT DETECTED Final   Staphylococcus species NOT DETECTED NOT DETECTED Final   Staphylococcus aureus (BCID) NOT DETECTED NOT DETECTED Final   Streptococcus species NOT DETECTED NOT DETECTED Final   Streptococcus agalactiae NOT DETECTED NOT DETECTED Final   Streptococcus pneumoniae NOT DETECTED NOT DETECTED Final   Streptococcus pyogenes NOT DETECTED NOT DETECTED Final   Acinetobacter baumannii NOT DETECTED NOT DETECTED Final   Enterobacteriaceae species DETECTED (A) NOT DETECTED Final    Comment: Enterobacteriaceae represent a large family of gram-negative  bacteria, not a single organism. CRITICAL RESULT CALLED TO, READ BACK BY AND VERIFIED WITH: PHARMD LAURA SEAY AT 0255 BY MESSAN H ON 02/19/2020    Enterobacter cloacae complex NOT DETECTED NOT DETECTED Final   Escherichia coli NOT DETECTED NOT DETECTED Final   Klebsiella oxytoca NOT DETECTED NOT DETECTED Final   Klebsiella pneumoniae DETECTED (A) NOT DETECTED Final    Comment: CRITICAL RESULT CALLED TO, READ BACK BY AND VERIFIED WITH: PHARMD LAURA SEAY AT 0255 BY MESSAN H ON 02/19/2020    Proteus species NOT DETECTED NOT DETECTED Final   Serratia marcescens NOT DETECTED NOT DETECTED Final   Carbapenem resistance NOT DETECTED NOT DETECTED Final   Haemophilus influenzae NOT DETECTED NOT DETECTED Final   Neisseria meningitidis NOT DETECTED NOT DETECTED Final   Pseudomonas aeruginosa NOT DETECTED NOT DETECTED Final   Candida albicans NOT DETECTED NOT DETECTED Final   Candida glabrata NOT DETECTED NOT DETECTED Final   Candida krusei NOT DETECTED NOT DETECTED Final   Candida parapsilosis NOT DETECTED NOT DETECTED Final   Candida tropicalis NOT DETECTED NOT DETECTED Final    Comment: Performed at [facility], [place] [address], [place], [place] [address]  Blood culture (routine x 2)     Status: Abnormal   Collection Time: 02/18/20 10:30 AM   Specimen: BLOOD RIGHT HAND  Result Value Ref Range Status   Specimen Description BLOOD RIGHT HAND  Final   Special Requests   Final    BOTTLES  DRAWN AEROBIC AND ANAEROBIC Blood Culture adequate volume   Culture  Setup Time   Final    IN BOTH AEROBIC AND ANAEROBIC BOTTLES GRAM NEGATIVE RODS CRITICAL VALUE NOTED.  VALUE IS CONSISTENT WITH PREVIOUSLY REPORTED AND CALLED VALUE.    Culture (A)  Final    KLEBSIELLA PNEUMONIAE SUSCEPTIBILITIES PERFORMED ON PREVIOUS CULTURE WITHIN THE LAST 5 DAYS. Performed at [facility], [place] [address] [place], [place] [address]    Report Status 02/21/2020 FINAL  Final  SARS Coronavirus 2 by RT PCR (hospital  order, performed in [facility] hospital lab) Nasopharyngeal Nasopharyngeal Swab     Status: None   Collection Time: 02/18/20  2:12 PM   Specimen: Nasopharyngeal Swab  Result Value Ref Range Status   SARS Coronavirus 2 NEGATIVE NEGATIVE Final    Comment: (NOTE) SARS-CoV-2 target nucleic acids are NOT DETECTED.  The SARS-CoV-2 RNA is generally detectable in upper and lower respiratory specimens during the acute phase of infection. The lowest concentration of SARS-CoV-2 viral copies this assay can detect is 250 copies / mL. A negative result does not preclude SARS-CoV-2 infection and should not be used as the sole basis for treatment or other patient management decisions.  A negative result may occur with improper specimen collection / handling, submission of specimen other than nasopharyngeal swab, presence of viral mutation(s) within the areas targeted by this assay, and inadequate number of viral copies (<250 copies / mL). A negative result must be combined with clinical observations, patient history, and epidemiological information.  Fact Sheet for Patients:   StrictlyIdeas.no  Fact Sheet for Healthcare Providers: BankingDealers.co.za  This test is not yet approved or  cleared by the [place] FDA and has been authorized for detection and/or diagnosis of SARS-CoV-2 by FDA under an Emergency Use Authorization (EUA).  This EUA will remain in effect (meaning this test can be used) for the duration of the COVID-19 declaration under Section 564(b)(1) of the Act, 21 U.S.C. section 360bbb-3(b)(1), unless the authorization is terminated or revoked sooner.  Performed at [facility], [place] [address] [place], [place] [address]   Urine culture     Status: None   Collection Time: 02/18/20  2:30 PM   Specimen: Urine, Random  Result Value Ref Range Status   Specimen Description URINE, RANDOM  Final   Special Requests NONE  Final    Culture   Final    NO GROWTH Performed at [facility], [place] [address], [place], [place] [address]    Report Status 02/19/2020 FINAL  Final     Time coordinating discharge: 35 minutes  SIGNED:   Aline August, MD  [facility] 02/21/2020, 9:29 AM

## 2020-02-21 NOTE — Care Management Important Message (Signed)
Important Message  Patient Details  Name: Jasmine Bradford MRN: [id number] Date of Birth: June 05, 1956   Medicare Important Message Given:  Yes     Orbie Pyo 02/21/2020, 12:37 PM

## 2020-02-21 NOTE — Progress Notes (Signed)
Discharged patient to home, AVS given and explained. Belongings returned accordingly.

## 2020-02-23 ENCOUNTER — Inpatient Hospital Stay (HOSPITAL_COMMUNITY)
Admission: EM | Admit: 2020-02-23 | Discharge: 2020-02-25 | DRG: 187 | Disposition: A | Discharge status: Home / Self Care | Payer: Medicare HMO | Attending: Internal Medicine | Admitting: Internal Medicine

## 2020-02-23 ENCOUNTER — Encounter (HOSPITAL_COMMUNITY): Payer: Self-pay | Admitting: Emergency Medicine

## 2020-02-23 ENCOUNTER — Other Ambulatory Visit: Payer: Self-pay

## 2020-02-23 ENCOUNTER — Emergency Department (HOSPITAL_COMMUNITY): Payer: Medicare HMO

## 2020-02-23 DIAGNOSIS — Z9049 Acquired absence of other specified parts of digestive tract: Secondary | ICD-10-CM | POA: Diagnosis not present

## 2020-02-23 DIAGNOSIS — J90 Pleural effusion, not elsewhere classified: Secondary | ICD-10-CM | POA: Diagnosis not present

## 2020-02-23 DIAGNOSIS — Z87891 Personal history of nicotine dependence: Secondary | ICD-10-CM

## 2020-02-23 DIAGNOSIS — B961 Klebsiella pneumoniae [K. pneumoniae] as the cause of diseases classified elsewhere: Secondary | ICD-10-CM | POA: Diagnosis not present

## 2020-02-23 DIAGNOSIS — R7881 Bacteremia: Secondary | ICD-10-CM | POA: Diagnosis present

## 2020-02-23 DIAGNOSIS — Z20822 Contact with and (suspected) exposure to covid-19: Secondary | ICD-10-CM | POA: Diagnosis not present

## 2020-02-23 DIAGNOSIS — Z8041 Family history of malignant neoplasm of ovary: Secondary | ICD-10-CM | POA: Diagnosis not present

## 2020-02-23 DIAGNOSIS — J189 Pneumonia, unspecified organism: Secondary | ICD-10-CM | POA: Diagnosis not present

## 2020-02-23 DIAGNOSIS — R079 Chest pain, unspecified: Secondary | ICD-10-CM | POA: Diagnosis not present

## 2020-02-23 DIAGNOSIS — R0602 Shortness of breath: Secondary | ICD-10-CM | POA: Diagnosis not present

## 2020-02-23 LAB — CBC WITH DIFFERENTIAL/PLATELET
Abs Immature Granulocytes: 0 10*3/uL (ref 0.00–0.07)
Basophils Absolute: 0.1 10*3/uL (ref 0.0–0.1)
Basophils Relative: 1 %
Eosinophils Absolute: 0.1 10*3/uL (ref 0.0–0.5)
Eosinophils Relative: 1 %
HCT: 44.6 % (ref 36.0–46.0)
Hemoglobin: 14.9 g/dL (ref 12.0–15.0)
Lymphocytes Relative: 25 %
Lymphs Abs: 1.8 10*3/uL (ref 0.7–4.0)
MCH: 28 pg (ref 26.0–34.0)
MCHC: 33.4 g/dL (ref 30.0–36.0)
MCV: 83.8 fL (ref 80.0–100.0)
Monocytes Absolute: 0.4 10*3/uL (ref 0.1–1.0)
Monocytes Relative: 6 %
Neutro Abs: 4.7 10*3/uL (ref 1.7–7.7)
Neutrophils Relative %: 67 %
Platelets: 227 10*3/uL (ref 150–400)
RBC: 5.32 MIL/uL — ABNORMAL HIGH (ref 3.87–5.11)
RDW: 12.7 % (ref 11.5–15.5)
WBC: 7 10*3/uL (ref 4.0–10.5)
nRBC: 0 % (ref 0.0–0.2)
nRBC: 0 /100 WBC

## 2020-02-23 LAB — COMPREHENSIVE METABOLIC PANEL
ALT: 165 U/L — ABNORMAL HIGH (ref 0–44)
AST: 33 U/L (ref 15–41)
Albumin: 3 g/dL — ABNORMAL LOW (ref 3.5–5.0)
Alkaline Phosphatase: 107 U/L (ref 38–126)
Anion gap: 15 (ref 5–15)
BUN: 11 mg/dL (ref 8–23)
CO2: 21 mmol/L — ABNORMAL LOW (ref 22–32)
Calcium: 8.8 mg/dL — ABNORMAL LOW (ref 8.9–10.3)
Chloride: 102 mmol/L (ref 98–111)
Creatinine, Ser: 0.77 mg/dL (ref 0.44–1.00)
GFR calc Af Amer: >60 mL/min (ref >60)
GFR calc non Af Amer: >60 mL/min (ref >60)
Glucose, Bld: 102 mg/dL — ABNORMAL HIGH (ref 70–99)
Potassium: 3.2 mmol/L — ABNORMAL LOW (ref 3.5–5.1)
Sodium: 138 mmol/L (ref 135–145)
Total Bilirubin: 1.8 mg/dL — ABNORMAL HIGH (ref 0.3–1.2)
Total Protein: 6.8 g/dL (ref 6.5–8.1)

## 2020-02-23 LAB — SARS CORONAVIRUS 2 BY RT PCR (HOSPITAL ORDER, PERFORMED IN ~~LOC~~ HOSPITAL LAB): SARS Coronavirus 2: NEGATIVE

## 2020-02-23 LAB — LIPASE, BLOOD: Lipase: 65 U/L — ABNORMAL HIGH (ref 11–51)

## 2020-02-23 LAB — TROPONIN I (HIGH SENSITIVITY): Troponin I (High Sensitivity): 5 ng/L (ref <18)

## 2020-02-23 MED ORDER — IOHEXOL 350 MG/ML SOLN
100.0000 mL | Freq: Once | INTRAVENOUS | Status: AC | PRN
  Administered 2020-02-23: 48 mL via INTRAVENOUS

## 2020-02-23 MED ORDER — PIPERACILLIN-TAZOBACTAM 3.375 G IVPB 30 MIN
3.3750 g | Freq: Once | INTRAVENOUS | Status: AC
  Administered 2020-02-23: 3.375 g via INTRAVENOUS
  Filled 2020-02-23: qty 50

## 2020-02-23 MED ORDER — MORPHINE SULFATE 15 MG PO TABS
7.5000 mg | ORAL_TABLET | Freq: Once | ORAL | Status: AC
  Administered 2020-02-23: 7.5 mg via ORAL
  Filled 2020-02-23: qty 1

## 2020-02-23 MED ORDER — ENOXAPARIN SODIUM 40 MG/0.4ML ~~LOC~~ SOLN
40.0000 mg | SUBCUTANEOUS | Status: DC
  Administered 2020-02-23 – 2020-02-25 (×2): 40 mg via SUBCUTANEOUS
  Filled 2020-02-23 (×2): qty 0.4

## 2020-02-23 MED ORDER — ACETAMINOPHEN 500 MG PO TABS
1000.0000 mg | ORAL_TABLET | Freq: Once | ORAL | Status: AC
  Administered 2020-02-23: 1000 mg via ORAL
  Filled 2020-02-23: qty 2

## 2020-02-23 MED ORDER — LORATADINE 10 MG PO TABS
10.0000 mg | ORAL_TABLET | Freq: Every day | ORAL | Status: DC
  Administered 2020-02-25: 10 mg via ORAL
  Filled 2020-02-23 (×2): qty 1

## 2020-02-23 MED ORDER — HYDROMORPHONE HCL 2 MG PO TABS
2.0000 mg | ORAL_TABLET | ORAL | Status: DC | PRN
  Administered 2020-02-24 – 2020-02-25 (×3): 2 mg via ORAL
  Filled 2020-02-23 (×3): qty 1

## 2020-02-23 MED ORDER — SODIUM CHLORIDE 0.9 % IV SOLN
1.0000 g | INTRAVENOUS | Status: DC
  Administered 2020-02-23 – 2020-02-24 (×2): 1 g via INTRAVENOUS
  Filled 2020-02-23 (×2): qty 0.02

## 2020-02-23 MED ORDER — ONDANSETRON HCL 4 MG/2ML IJ SOLN
4.0000 mg | Freq: Once | INTRAMUSCULAR | Status: AC
  Administered 2020-02-23: 4 mg via INTRAVENOUS
  Filled 2020-02-23: qty 2

## 2020-02-23 MED ORDER — PANTOPRAZOLE SODIUM 40 MG PO TBEC
40.0000 mg | DELAYED_RELEASE_TABLET | Freq: Every day | ORAL | Status: DC
  Administered 2020-02-24: 40 mg via ORAL
  Filled 2020-02-23 (×3): qty 1

## 2020-02-23 MED ORDER — POTASSIUM CHLORIDE CRYS ER 20 MEQ PO TBCR
40.0000 meq | EXTENDED_RELEASE_TABLET | Freq: Once | ORAL | Status: AC
  Administered 2020-02-23: 40 meq via ORAL
  Filled 2020-02-23: qty 2

## 2020-02-23 MED ORDER — ADULT MULTIVITAMIN W/MINERALS CH
1.0000 | ORAL_TABLET | Freq: Every day | ORAL | Status: DC
  Administered 2020-02-24 – 2020-02-25 (×2): 1 via ORAL
  Filled 2020-02-23 (×2): qty 1

## 2020-02-23 MED ORDER — ALBUTEROL SULFATE (2.5 MG/3ML) 0.083% IN NEBU
5.0000 mg | INHALATION_SOLUTION | Freq: Once | RESPIRATORY_TRACT | Status: AC
  Administered 2020-02-23: 5 mg via RESPIRATORY_TRACT
  Filled 2020-02-23: qty 6

## 2020-02-23 MED ORDER — ONDANSETRON HCL 4 MG PO TABS
4.0000 mg | ORAL_TABLET | Freq: Four times a day (QID) | ORAL | Status: DC | PRN
  Administered 2020-02-24 – 2020-02-25 (×2): 4 mg via ORAL
  Filled 2020-02-23 (×2): qty 1

## 2020-02-23 MED ORDER — HYPROMELLOSE (GONIOSCOPIC) 2.5 % OP SOLN
1.0000 [drp] | Freq: Every day | OPHTHALMIC | Status: DC
  Filled 2020-02-23: qty 15

## 2020-02-23 MED ORDER — VANCOMYCIN HCL 1250 MG/250ML IV SOLN
1250.0000 mg | Freq: Once | INTRAVENOUS | Status: AC
  Administered 2020-02-23: 1250 mg via INTRAVENOUS
  Filled 2020-02-23: qty 250

## 2020-02-23 MED ORDER — DIPHENHYDRAMINE HCL 25 MG PO CAPS
25.0000 mg | ORAL_CAPSULE | Freq: Four times a day (QID) | ORAL | Status: DC | PRN
  Administered 2020-02-24: 25 mg via ORAL
  Filled 2020-02-23: qty 1

## 2020-02-23 MED ORDER — IBUPROFEN 200 MG PO TABS
200.0000 mg | ORAL_TABLET | Freq: Four times a day (QID) | ORAL | Status: DC | PRN
  Administered 2020-02-24 – 2020-02-25 (×2): 200 mg via ORAL
  Filled 2020-02-23 (×2): qty 1

## 2020-02-23 MED ORDER — POLYVINYL ALCOHOL 1.4 % OP SOLN
1.0000 [drp] | Freq: Every day | OPHTHALMIC | Status: DC
  Administered 2020-02-24 – 2020-02-25 (×2): 1 [drp] via OPHTHALMIC
  Filled 2020-02-23: qty 15

## 2020-02-23 MED ORDER — MORPHINE SULFATE 15 MG PO TABS: 15.0000 mg | ORAL_TABLET | Freq: Once | ORAL | Status: DC

## 2020-02-23 NOTE — H&P (Signed)
History and Physical    Jasmine Bradford HCW:[id number] DOB: Jan 26, 1956 DOA: 02/23/2020  PCP: Ronnald Nian, DO (Confirm with patient/family/NH records and if not entered, this has to be entered at [facility] point of entry) Patient coming from: Home  I have personally briefly reviewed patient's old medical records in [place]  Chief Complaint: Chest pain short of breath  HPI: Jasmine Bradford is a 64 y.o. female with medical history significant of recurrent choledocholithiasis status post ERCP/biliary sphincterotomy/sphincteroplasty/stone extraction on 6/30/2021cholecystectomy presented with increasing short of breath and left-sided chest pain. On last admission, patient was diagnosed with Klebsiella pneumonia bacteremia which was conservatively related to biliary infection. Patient was discharged with Keflex. Increasing, patient started to feel left-sided sharp-like pleural chest pain worsening with chest movement and deep breath and cough, denies any fever chills. Meantime she started to feel worsening of short of breath, she could barely walk 5 minutes then started to feel strong feeling short of breath. No diarrhea, no abdominal pain. ED Course: CTA negative for PE positive for bilateral new onset pleural effusion moderate amount on the left side. WBC 7.0  Review of Systems: As per HPI otherwise 10 point review of systems negative.    Past Medical History:  Diagnosis Date  . Cholelithiasis with choledocholithiasis   . Diverticulitis   . Tobacco abuse   . Varicose vein of leg    no problems now  . Wears glasses     Past Surgical History:  Procedure Laterality Date  . BILIARY DILATION  02/19/2020   Procedure: BILIARY DILATION;  Surgeon: Jackquline Denmark, MD;  [facility] END[facility]COPY;  Service: Endoscopy;;  . CESAREAN SECTION    . CHOLECYSTECTOMY N/A 05/23/2016   Procedure: LAPAROSCOPIC CHOLECYSTECTOMY WITH INTRAOPERATIVE CHOLANGIOGRAM;  Surgeon: Jackolyn Confer, MD;   Location: [facility];  Service: General;  Laterality: N/A;  . ENDOSCOPIC RETROGRADE CHOLANGIOPANCREATOGRAPHY (ERCP) WITH PROPOFOL N/A 06/06/2016   Procedure: ENDOSCOPIC RETROGRADE CHOLANGIOPANCREATOGRAPHY (ERCP) WITH PROPOFOL;  Surgeon: Clarene Essex, MD;  Location: [facility] ENDOSCOPY;  Service: Endoscopy;  Laterality: N/A;  . ENDOSCOPIC RETROGRADE CHOLANGIOPANCREATOGRAPHY (ERCP) WITH PROPOFOL N/A 02/19/2020   Procedure: ENDOSCOPIC RETROGRADE CHOLANGIOPANCREATOGRAPHY (ERCP) WITH PROPOFOL;  Surgeon: Jackquline Denmark, MD;  Location: [facility] ENDOSCOPY;  Service: Endoscopy;  Laterality: N/A;  . ERCP N/A 03/09/2016   Procedure: ENDOSCOPIC RETROGRADE CHOLANGIOPANCREATOGRAPHY (ERCP);  Surgeon: Clarene Essex, MD;  Location: [facility] ENDOSCOPY;  Service: Endoscopy;  Laterality: N/A;  . ERCP N/A 06/12/2018   Procedure: ENDOSCOPIC RETROGRADE CHOLANGIOPANCREATOGRAPHY (ERCP);  Surgeon: Clarene Essex, MD;  Location: [facility] ENDOSCOPY;  Service: Endoscopy;  Laterality: N/A;  . ESOPHAGOGASTRODUODENOSCOPY N/A 06/12/2018   Procedure: ESOPHAGOGASTRODUODENOSCOPY (EGD);  Surgeon: Clarene Essex, MD;  Location: [facility] ENDOSCOPY;  Service: Endoscopy;  Laterality: N/A;  . laser vein surgery     last year  . REMOVAL OF STONES  06/12/2018   Procedure: REMOVAL OF STONES;  Surgeon: Clarene Essex, MD;  Location: [facility] ENDOSCOPY;  Service: Endoscopy;;  . REMOVAL OF STONES  02/19/2020   Procedure: REMOVAL OF STONES;  Surgeon: Jackquline Denmark, MD;  Location: [facility] ENDOSCOPY;  Service: Endoscopy;;  . Joan Mayans  06/12/2018   Procedure: Joan Mayans;  Surgeon: Clarene Essex, MD;  Location: [facility] ENDOSCOPY;  Service: Endoscopy;;  . Joan Mayans  02/19/2020   Procedure: SPHINCTEROTOMY;  Surgeon: Jackquline Denmark, MD;  Location: [facility];  Service: Endoscopy;;  . TUBAL LIGATION    . VARICOSE VEIN SURGERY Left      reports that she has quit smoking. Her smoking use included cigarettes. She has a 17.50 pack-year  smoking history. She has never used smokeless tobacco. She reports that  she does not drink alcohol and does not use drugs.  Allergies  Allergen Reactions  . Oxycodone Nausea And Vomiting and Other (See Comments)    Also made patient lightheaded and lethargic    Family History  Problem Relation Age of Onset  . Memory loss Mother   . Ovarian cancer Mother   . Colon polyps Neg Hx   . Colon cancer Neg Hx      Prior to Admission medications   Medication Sig Start Date End Date Taking? Authorizing Provider  Carboxymethylcellulose Sodium (THERATEARS) 0.25 % SOLN Place 1 drop into both eyes daily.    [provider]  cephALEXin (KEFLEX) 500 MG capsule Take 1 capsule (500 mg total) by mouth 3 (three) times daily for 5 days. 02/21/20 02/26/20  Aline August, MD  cetirizine (ZYRTEC) 10 MG tablet Take 10 mg by mouth daily as needed for allergies.    [provider]  ibuprofen (ADVIL) 200 MG tablet Take 200 mg by mouth every 6 (six) hours as needed for headache or moderate pain.    [provider]  Multiple Vitamin (MULTIVITAMIN WITH MINERALS) TABS tablet Take 1 tablet by mouth daily with lunch.    [provider]  omeprazole (PRILOSEC) 40 MG capsule Take 1 capsule (40 mg total) by mouth daily. 11/08/19   Cirigliano, Mary K, DO  ondansetron (ZOFRAN) 4 MG tablet Take 1 tablet (4 mg total) by mouth every 6 (six) hours as needed for nausea. 02/21/20   Aline August, MD  oxyCODONE (OXY IR/ROXICODONE) 5 MG immediate release tablet Take 1 tablet (5 mg total) by mouth every 6 (six) hours as needed for severe pain. 02/21/20   Aline August, MD    Physical Exam: Vitals:   02/23/20 1455 02/23/20 1500 02/23/20 1521 02/23/20 1544  BP:  (!) 95/57 99/61 (!) 102/47  Pulse:  75  78  Resp:  20 18 19   Temp:   98.4 F (36.9 C) 98.6 F (37 C)  TempSrc:   Oral   SpO2:  93% 94% 95%  Weight: 62.7 kg     Height: 5\' 2"  (1.575 m)       Constitutional: NAD, calm, comfortable Vitals:   02/23/20 1455 02/23/20 1500 02/23/20 1521 02/23/20 1544  BP:  (!)  95/57 99/61 (!) 102/47  Pulse:  75  78  Resp:  20 18 19   Temp:   98.4 F (36.9 C) 98.6 F (37 C)  TempSrc:   Oral   SpO2:  93% 94% 95%  Weight: 62.7 kg     Height: 5\' 2"  (1.575 m)      Eyes: PERRL, lids and conjunctivae normal ENMT: Mucous membranes are moist. Posterior pharynx clear of any exudate or lesions.Normal dentition.  Neck: normal, supple, no masses, no thyromegaly Respiratory: Decreased breathing sound on left lower field, no wheezing, no crackles. Normal respiratory effort. No accessory muscle use.  Cardiovascular: Regular rate and rhythm, no murmurs / rubs / gallops. No extremity edema. 2+ pedal pulses. No carotid bruits.  Abdomen: no tenderness, no masses palpated. No hepatosplenomegaly. Bowel sounds positive.  Musculoskeletal: no clubbing / cyanosis. No joint deformity upper and lower extremities. Good ROM, no contractures. Normal muscle tone.  Skin: no rashes, lesions, ulcers. No induration Neurologic: CN 2-12 grossly intact. Sensation intact, DTR normal. Strength 5/5 in all 4.  Psychiatric: Normal judgment and insight. Alert and oriented x 3. Normal mood.     Labs  on Admission: I have personally reviewed following labs and imaging studies  CBC: Recent Labs  Lab 02/18/20 0632 02/19/20 0341 02/20/20 0411 02/21/20 0241 02/23/20 1030  WBC 5.1 12.4* 10.2 11.3* 7.0  NEUTROABS  --   --  8.4* 9.7* 4.7  HGB 13.3 11.8* 10.5* 10.3* 14.9  HCT 40.7 35.5* 32.0* 30.9* 44.6  MCV 85.3 86.0 85.6 85.1 83.8  PLT 161 142* 126* 150 237   Basic Metabolic Panel: Recent Labs  Lab 02/18/20 0632 02/18/20 1530 02/19/20 0341 02/20/20 0411 02/21/20 0241 02/23/20 1030  NA 136  --  140 139 141 138  K 3.2*  --  3.7 4.0 3.5 3.2*  CL 101  --  107 111 109 102  CO2 17*  --  24 22 24  21*  GLUCOSE 224*  --  98 111* 102* 102*  BUN 21  --  17 15 14 11   CREATININE 1.38*  --  0.91 0.77 0.87 0.77  CALCIUM 8.8*  --  8.0* 8.2* 8.0* 8.8*  MG  --  1.6*  --  2.1 1.8  --   PHOS  --  4.4  --    --   --   --    GFR: Estimated Creatinine Clearance: 61 mL/min (by C-G formula based on SCr of 0.77 mg/dL). Liver Function Tests: Recent Labs  Lab 02/18/20 1008 02/19/20 0341 02/20/20 0411 02/21/20 0241 02/23/20 1030  AST 2,102* 538* 157* 57* 33  ALT 1,343* 750* 453* 287* 165*  ALKPHOS 154* 104 91 89 107  BILITOT 2.9* 4.8* 1.9* 1.3* 1.8*  PROT 6.8 5.3* 5.1* 5.2* 6.8  ALBUMIN 3.6 2.6* 2.3* 2.3* 3.0*   Recent Labs  Lab 02/18/20 1008 02/23/20 1030  LIPASE 21 65*   Recent Labs  Lab 02/18/20 1530  AMMONIA 28   Coagulation Profile: Recent Labs  Lab 02/18/20 1057  INR 1.2   Cardiac Enzymes: No results for input(s): CKTOTAL, CKMB, CKMBINDEX, TROPONINI in the last 168 hours. BNP (last 3 results) No results for input(s): PROBNP in the last 8760 hours. HbA1C: No results for input(s): HGBA1C in the last 72 hours. CBG: No results for input(s): GLUCAP in the last 168 hours. Lipid Profile: No results for input(s): CHOL, HDL, LDLCALC, TRIG, CHOLHDL, LDLDIRECT in the last 72 hours. Thyroid Function Tests: No results for input(s): TSH, T4TOTAL, FREET4, T3FREE, THYROIDAB in the last 72 hours. Anemia Panel: No results for input(s): VITAMINB12, FOLATE, FERRITIN, TIBC, IRON, RETICCTPCT in the last 72 hours. Urine analysis:    Component Value Date/Time   COLORURINE AMBER (A) 02/18/2020 1430   APPEARANCEUR CLEAR 02/18/2020 1430   LABSPEC >1.046 (H) 02/18/2020 1430   PHURINE 5.0 02/18/2020 1430   GLUCOSEU NEGATIVE 02/18/2020 1430   HGBUR SMALL (A) 02/18/2020 1430   BILIRUBINUR NEGATIVE 02/18/2020 1430   KETONESUR NEGATIVE 02/18/2020 1430   PROTEINUR NEGATIVE 02/18/2020 1430   NITRITE NEGATIVE 02/18/2020 1430   LEUKOCYTESUR TRACE (A) 02/18/2020 1430    Radiological Exams on Admission: DG Chest 2 View  Result Date: 02/23/2020 CLINICAL DATA:  Shortness of breath, LEFT-sided chest pain. EXAM: CHEST - 2 VIEW COMPARISON:  02/18/2020 FINDINGS: Trachea is midline.  Cardiomediastinal contours are stable accounting for partial obscuration of LEFT heart border and LEFT hemidiaphragm which has developed since the prior study. Hilar structures are normal. Basilar airspace disease and blunting of LEFT and RIGHT costodiaphragmatic sulci worse on the LEFT extending into the lower third of the LEFT chest. No additional areas of airspace consolidation. On limited assessment visualized  skeletal structures are unremarkable. IMPRESSION: 1. New bibasilar airspace disease and effusions, worse on the LEFT. 2. Findings may represent developing pneumonia or sequela of aspiration. Electronically Signed   By: Zetta Bills M.D.   On: 02/23/2020 10:52   CT Angio Chest PE W and/or Wo Contrast  Result Date: 02/23/2020 CLINICAL DATA:  Shortness of breath.  Assess for pulmonary embolus. EXAM: CT ANGIOGRAPHY CHEST WITH CONTRAST TECHNIQUE: Multidetector CT imaging of the chest was performed using the standard protocol during bolus administration of intravenous contrast. Multiplanar CT image reconstructions and MIPs were obtained to evaluate the vascular anatomy. CONTRAST:  73mL OMNIPAQUE IOHEXOL 350 MG/ML SOLN COMPARISON:  None. Chest x-ray July 4th 2021 FINDINGS: Cardiovascular: Satisfactory opacification of the pulmonary arteries to the segmental level. No evidence of pulmonary embolism. Normal heart size. No pericardial effusion. Mediastinum/Nodes: No enlarged mediastinal, hilar, or axillary lymph nodes. Thyroid gland, trachea, and esophagus demonstrate no significant findings. Lungs/Pleura: Moderate bilateral pleural effusions are identified. Consolidation with air bronchograms are identified in bilateral lower lobe in part due to compression atelectasis but superimposed pneumonia is not excluded. Upper Abdomen: Status post prior cholecystectomy with intra hepatic biliary air. Musculoskeletal: Degenerative joint changes of the spine are noted. Review of the MIP images confirms the above  findings. IMPRESSION: 1. No pulmonary embolus. 2. Moderate bilateral pleural effusions. Consolidation with air bronchograms are identified in bilateral lower lobe in part due to compression atelectasis but superimposed pneumonia is not excluded. Electronically Signed   By: Abelardo Diesel M.D.   On: 02/23/2020 11:52    EKG: Independently reviewed. ST-T flattening on chest leads  Assessment/Plan Active Problems:   Pleural effusion  (please populate well all problems here in Problem List. (For example, if patient is on BP meds at home and you resume or decide to hold them, it is a problem that needs to be her. Same for CAD, COPD, HLD and so on)  Acute bilateral pleural effusion -On the left side which caused a lot of symptoms. Will order IR guided Thora centesis. Overall suspect parapneumonic effusion, but other etiology cannot be ruled out at this point. Will escalate antibiotics to ceftriaxone. -Pleural evaluation cytology and culture, also ordered from LDH and albumin level to help differentiate transudate versus exudate. -Echo  DVT prophylaxis: Lovenox Code Status: Full code Family Communication: Husband at bedside Disposition Plan: Likely can be discharged home in next 24 to 48 hours, if symptoms stabilized and fluid analyzed rule out severe infection/empyema. Consults called: Interventional radiology Admission status: MedSurg   Lequita Halt MD [facility] Pager [phone]    02/23/2020, 4:37 PM

## 2020-02-23 NOTE — ED Notes (Signed)
Pt in CT.

## 2020-02-23 NOTE — ED Triage Notes (Signed)
Pt. Stated, I think I have pneumonia for a week. I was admitted here for gall stones and Ive had SOB, cant take a deep breath without it hurting. I hurts along my side and lower back. I was D/C'ed on Friday.

## 2020-02-23 NOTE — ED Provider Notes (Signed)
[facility] EMERGENCY DEPARTMENT Provider Note   CSN: [id number] Arrival date & time: 02/23/20  9242     History Chief Complaint  Patient presents with  . Shortness of Breath  . Chest Pain    Jasmine Bradford is a 64 y.o. female.  64 yo F with a chief complaints of left-sided chest pain.  Sharp and pleuritic.  Difficulty taking a deep breath.  Also having shortness of breath with this.  Symptoms radiates up into her left trapezius area.  Denies trauma having a mild cough over the past couple days.  No fevers.  Patient was recently in the hospital and found to have common bile duct dilatation and acute hepatitis.  He was also found to have Klebsiella bacteremia and was started on antibiotics.  She was discharged home on Keflex.  She has been home for 2 days now but with the worsening pain and difficulty breathing she came to the hospital.  States that she was short of breath even just putting her shirt on.  Having some pain with laying back flat.  Worsening shortness of breath without as well.  Feels that her abdominal pain is improved significantly post procedure.  Patient was having this pain prior to coming to the hospital.  She was prescribed Percocet which she has an intolerance for and she will not take.  The history is provided by the patient.  Shortness of Breath Severity:  Moderate Onset quality:  Gradual Duration:  2 weeks Timing:  Constant Progression:  Worsening Chronicity:  New Relieved by:  Nothing Worsened by:  Nothing Ineffective treatments:  None tried Associated symptoms: chest pain   Associated symptoms: no fever, no headaches, no vomiting and no wheezing   Chest Pain Associated symptoms: shortness of breath   Associated symptoms: no dizziness, no fever, no headache, no nausea, no palpitations and no vomiting        Past Medical History:  Diagnosis Date  . Cholelithiasis with choledocholithiasis   . Diverticulitis   . Tobacco abuse     . Varicose vein of leg    no problems now  . Wears glasses     Patient Active Problem List   Diagnosis Date Noted  . Choledocholithiasis   . Elevated liver enzymes 02/18/2020  . AKI (acute kidney injury) ([place]) 02/18/2020  . High anion gap metabolic acidosis 68/34/1962  . GERD (gastroesophageal reflux disease) 02/18/2020  . Sepsis ([place]) 02/18/2020  . Shock liver 02/18/2020  . Hypotension 02/18/2020  . Cholelithiasis with choledocholithiasis   . Biliary obstruction 06/05/2016  . Elevated LFTs   . Abdominal pain 03/03/2016  . Hypokalemia 03/03/2016  . Chest pressure 03/03/2016  . LFTs abnormal 03/03/2016  . Tobacco abuse     Past Surgical History:  Procedure Laterality Date  . BILIARY DILATION  02/19/2020   Procedure: BILIARY DILATION;  Surgeon: Jackquline Denmark, MD;  Location: [facility] ENDOSCOPY;  Service: Endoscopy;;  . CESAREAN SECTION    . CHOLECYSTECTOMY N/A 05/23/2016   Procedure: LAPAROSCOPIC CHOLECYSTECTOMY WITH INTRAOPERATIVE CHOLANGIOGRAM;  Surgeon: Jackolyn Confer, MD;  Location: [place];  Service: General;  Laterality: N/A;  . ENDOSCOPIC RETROGRADE CHOLANGIOPANCREATOGRAPHY (ERCP) WITH PROPOFOL N/A 06/06/2016   Procedure: ENDOSCOPIC RETROGRADE CHOLANGIOPANCREATOGRAPHY (ERCP) WITH PROPOFOL;  Surgeon: Clarene Essex, MD;  Location: [facility] ENDOSCOPY;  Service: Endoscopy;  Laterality: N/A;  . ENDOSCOPIC RETROGRADE CHOLANGIOPANCREATOGRAPHY (ERCP) WITH PROPOFOL N/A 02/19/2020   Procedure: ENDOSCOPIC RETROGRADE CHOLANGIOPANCREATOGRAPHY (ERCP) WITH PROPOFOL;  Surgeon: Jackquline Denmark, MD;  Location: [facility] ENDOSCOPY;  Service: Endoscopy;  Laterality: N/A;  . ERCP N/A 03/09/2016   Procedure: ENDOSCOPIC RETROGRADE CHOLANGIOPANCREATOGRAPHY (ERCP);  Surgeon: Clarene Essex, MD;  Location: [facility] ENDOSCOPY;  Service: Endoscopy;  Laterality: N/A;  . ERCP N/A 06/12/2018   Procedure: ENDOSCOPIC RETROGRADE CHOLANGIOPANCREATOGRAPHY (ERCP);  Surgeon: Clarene Essex, MD;  Location: [facility] ENDOSCOPY;  Service: Endoscopy;   Laterality: N/A;  . ESOPHAGOGASTRODUODENOSCOPY N/A 06/12/2018   Procedure: ESOPHAGOGASTRODUODENOSCOPY (EGD);  Surgeon: Clarene Essex, MD;  Location: [facility] ENDOSCOPY;  Service: Endoscopy;  Laterality: N/A;  . laser vein surgery     last year  . REMOVAL OF STONES  06/12/2018   Procedure: REMOVAL OF STONES;  Surgeon: Clarene Essex, MD;  Location: [facility] ENDOSCOPY;  Service: Endoscopy;;  . REMOVAL OF STONES  02/19/2020   Procedure: REMOVAL OF STONES;  Surgeon: Jackquline Denmark, MD;  Location: [facility] ENDOSCOPY;  Service: Endoscopy;;  . Joan Mayans  06/12/2018   Procedure: Joan Mayans;  Surgeon: Clarene Essex, MD;  Location: [facility] ENDOSCOPY;  Service: Endoscopy;;  . Joan Mayans  02/19/2020   Procedure: SPHINCTEROTOMY;  Surgeon: Jackquline Denmark, MD;  Location: [place];  Service: Endoscopy;;  . TUBAL LIGATION    . VARICOSE VEIN SURGERY Left      OB History   No obstetric history on file.     Family History  Problem Relation Age of Onset  . Memory loss Mother   . Ovarian cancer Mother   . Colon polyps Neg Hx   . Colon cancer Neg Hx     Social History   Tobacco Use  . Smoking status: Former Smoker    Packs/day: 0.50    Years: 35.00    Pack years: 17.50    Types: Cigarettes  . Smokeless tobacco: Never Used  . Tobacco comment: No smoing since 02/2016- "wearing Nicotine patch"  Vaping Use  . Vaping Use: Never used  Substance Use Topics  . Alcohol use: No    Alcohol/week: 0.0 standard drinks  . Drug use: No    Home Medications Prior to Admission medications   Medication Sig Start Date End Date Taking? Authorizing Provider  Carboxymethylcellulose Sodium (THERATEARS) 0.25 % SOLN Place 1 drop into both eyes daily.    [provider]  cephALEXin (KEFLEX) 500 MG capsule Take 1 capsule (500 mg total) by mouth 3 (three) times daily for 5 days. 02/21/20 02/26/20  Aline August, MD  cetirizine (ZYRTEC) 10 MG tablet Take 10 mg by mouth daily as needed for allergies.    [provider]    ibuprofen (ADVIL) 200 MG tablet Take 200 mg by mouth every 6 (six) hours as needed for headache or moderate pain.    [provider]  Multiple Vitamin (MULTIVITAMIN WITH MINERALS) TABS tablet Take 1 tablet by mouth daily with lunch.    [provider]  omeprazole (PRILOSEC) 40 MG capsule Take 1 capsule (40 mg total) by mouth daily. 11/08/19   Cirigliano, Mary K, DO  ondansetron (ZOFRAN) 4 MG tablet Take 1 tablet (4 mg total) by mouth every 6 (six) hours as needed for nausea. 02/21/20   Aline August, MD  oxyCODONE (OXY IR/ROXICODONE) 5 MG immediate release tablet Take 1 tablet (5 mg total) by mouth every 6 (six) hours as needed for severe pain. 02/21/20   Aline August, MD    Allergies    Oxycodone  Review of Systems   Review of Systems  Constitutional: Negative for chills and fever.  HENT: Negative for congestion and rhinorrhea.   Eyes: Negative for redness and visual disturbance.  Respiratory: Positive for shortness  of breath. Negative for wheezing.   Cardiovascular: Positive for chest pain. Negative for palpitations.  Gastrointestinal: Negative for nausea and vomiting.  Genitourinary: Negative for dysuria and urgency.  Musculoskeletal: Negative for arthralgias and myalgias.  Skin: Negative for pallor and wound.  Neurological: Negative for dizziness and headaches.    Physical Exam Updated Vital Signs BP 123/70   Pulse (!) 110   Temp 98.8 F (37.1 C) (Oral)   Resp 17   SpO2 95%   Physical Exam Vitals and nursing note reviewed.  Constitutional:      General: She is not in acute distress.    Appearance: She is well-developed. She is not diaphoretic.  HENT:     Head: Normocephalic and atraumatic.  Eyes:     Pupils: Pupils are equal, round, and reactive to light.  Cardiovascular:     Rate and Rhythm: Normal rate and regular rhythm.     Heart sounds: No murmur heard.  No friction rub. No gallop.   Pulmonary:     Effort: Pulmonary effort is normal.      Breath sounds: Examination of the left-lower field reveals decreased breath sounds. Decreased breath sounds present. No wheezing or rales.  Chest:     Chest wall: There is dullness to percussion.     Comments: Decreased breath sounds in the left lower lung fields dull to percussion. Abdominal:     General: There is no distension.     Palpations: Abdomen is soft.     Tenderness: There is no abdominal tenderness.  Musculoskeletal:        General: No tenderness.     Cervical back: Normal range of motion and neck supple.  Skin:    General: Skin is warm and dry.  Neurological:     Mental Status: She is alert and oriented to person, place, and time.  Psychiatric:        Behavior: Behavior normal.     ED Results / Procedures / Treatments   Labs (all labs ordered are listed, but only abnormal results are displayed) Labs Reviewed  CBC WITH DIFFERENTIAL/PLATELET - Abnormal; Notable for the following components:      Result Value   RBC 5.32 (*)    All other components within normal limits  COMPREHENSIVE METABOLIC PANEL - Abnormal; Notable for the following components:   Potassium 3.2 (*)    CO2 21 (*)    Glucose, Bld 102 (*)    Calcium 8.8 (*)    Albumin 3.0 (*)    ALT 165 (*)    Total Bilirubin 1.8 (*)    All other components within normal limits  LIPASE, BLOOD - Abnormal; Notable for the following components:   Lipase 65 (*)    All other components within normal limits  CULTURE, BLOOD (ROUTINE X 2)  CULTURE, BLOOD (ROUTINE X 2)  TROPONIN I (HIGH SENSITIVITY)    EKG EKG Interpretation  Date/Time:  Sunday February 23 2020 09:35:07 EDT Ventricular Rate:  98 PR Interval:  122 QRS Duration: 74 QT Interval:  346 QTC Calculation: 441 R Axis:   61 Text Interpretation: Normal sinus rhythm Nonspecific ST abnormality Abnormal ECG No significant change since last tracing Confirmed by Deno Etienne [phone]) on 02/23/2020 9:39:28 AM   Radiology DG Chest 2 View  Result Date:  02/23/2020 CLINICAL DATA:  Shortness of breath, LEFT-sided chest pain. EXAM: CHEST - 2 VIEW COMPARISON:  02/18/2020 FINDINGS: Trachea is midline. Cardiomediastinal contours are stable accounting for partial obscuration of LEFT heart border and  LEFT hemidiaphragm which has developed since the prior study. Hilar structures are normal. Basilar airspace disease and blunting of LEFT and RIGHT costodiaphragmatic sulci worse on the LEFT extending into the lower third of the LEFT chest. No additional areas of airspace consolidation. On limited assessment visualized skeletal structures are unremarkable. IMPRESSION: 1. New bibasilar airspace disease and effusions, worse on the LEFT. 2. Findings may represent developing pneumonia or sequela of aspiration. Electronically Signed   By: Zetta Bills M.D.   On: 02/23/2020 10:52   CT Angio Chest PE W and/or Wo Contrast  Result Date: 02/23/2020 CLINICAL DATA:  Shortness of breath.  Assess for pulmonary embolus. EXAM: CT ANGIOGRAPHY CHEST WITH CONTRAST TECHNIQUE: Multidetector CT imaging of the chest was performed using the standard protocol during bolus administration of intravenous contrast. Multiplanar CT image reconstructions and MIPs were obtained to evaluate the vascular anatomy. CONTRAST:  95mL OMNIPAQUE IOHEXOL 350 MG/ML SOLN COMPARISON:  None. Chest x-ray July 4th 2021 FINDINGS: Cardiovascular: Satisfactory opacification of the pulmonary arteries to the segmental level. No evidence of pulmonary embolism. Normal heart size. No pericardial effusion. Mediastinum/Nodes: No enlarged mediastinal, hilar, or axillary lymph nodes. Thyroid gland, trachea, and esophagus demonstrate no significant findings. Lungs/Pleura: Moderate bilateral pleural effusions are identified. Consolidation with air bronchograms are identified in bilateral lower lobe in part due to compression atelectasis but superimposed pneumonia is not excluded. Upper Abdomen: Status post prior cholecystectomy with  intra hepatic biliary air. Musculoskeletal: Degenerative joint changes of the spine are noted. Review of the MIP images confirms the above findings. IMPRESSION: 1. No pulmonary embolus. 2. Moderate bilateral pleural effusions. Consolidation with air bronchograms are identified in bilateral lower lobe in part due to compression atelectasis but superimposed pneumonia is not excluded. Electronically Signed   By: Abelardo Diesel M.D.   On: 02/23/2020 11:52    Procedures Procedures (including critical care time)  Medications Ordered in ED Medications  vancomycin (VANCOREADY) IVPB 1250 mg/250 mL (has no administration in time range)  piperacillin-tazobactam (ZOSYN) IVPB 3.375 g (has no administration in time range)  albuterol (PROVENTIL) (2.5 MG/3ML) 0.083% nebulizer solution 5 mg (5 mg Nebulization Given 02/23/20 1014)  acetaminophen (TYLENOL) tablet 1,000 mg (1,000 mg Oral Given 02/23/20 1026)  ondansetron (ZOFRAN) injection 4 mg (4 mg Intravenous Given 02/23/20 1029)  morphine (MSIR) tablet 7.5 mg (7.5 mg Oral Given 02/23/20 1026)  iohexol (OMNIPAQUE) 350 MG/ML injection 100 mL (48 mLs Intravenous Contrast Given 02/23/20 1138)    ED Course  I have reviewed the triage vital signs and the nursing notes.  Pertinent labs & imaging results that were available during my care of the patient were reviewed by me and considered in my medical decision making (see chart for details).    MDM Rules/Calculators/A&P                          64 yo F with a chief complaints of left-sided chest pain.  This is pleuritic and radiates up into the left shoulder.  Has been going on since she was recently in the hospital.  She unfortunately had a dilated common bile duct and required an ERCP.  Was also found to have acute hepatitis and Klebsiella bacteremia.  Patient feels that her abdominal symptoms had significantly improved after her procedure but she continued to have the left-sided chest discomfort.  On review of the  patient's imaging it appears that she did have a small pleural effusion on that side on her MRCP  that was not seen on CT scan.  Clinically she has likely an effusion based on her lung sounds.  We will start with a chest x-ray.  Attempt to treat her pain here.  Reassess.  Patient with some mild pain improvement on reassessment.  Chest x-ray reviewed by me that is consistent with a left-sided pleural effusion.  CT scan of the chest consistent with a bilateral pleural effusion with possible component of pneumonia.  As the patient has been having increased cough pain and shortness of breath will broaden antibiotics.  Will discuss with medicine.  The patients results and plan were reviewed and discussed.   Any x-rays performed were independently reviewed by myself.   Differential diagnosis were considered with the presenting HPI.  Medications  vancomycin (VANCOREADY) IVPB 1250 mg/250 mL (has no administration in time range)  piperacillin-tazobactam (ZOSYN) IVPB 3.375 g (has no administration in time range)  albuterol (PROVENTIL) (2.5 MG/3ML) 0.083% nebulizer solution 5 mg (5 mg Nebulization Given 02/23/20 1014)  acetaminophen (TYLENOL) tablet 1,000 mg (1,000 mg Oral Given 02/23/20 1026)  ondansetron (ZOFRAN) injection 4 mg (4 mg Intravenous Given 02/23/20 1029)  morphine (MSIR) tablet 7.5 mg (7.5 mg Oral Given 02/23/20 1026)  iohexol (OMNIPAQUE) 350 MG/ML injection 100 mL (48 mLs Intravenous Contrast Given 02/23/20 1138)    Vitals:   02/23/20 1015 02/23/20 1030 02/23/20 1045 02/23/20 1100  BP: 128/76 134/72 128/70 123/70  Pulse: 79 (!) 105 98 (!) 110  Resp: 14 (!) 28 19 17   Temp:      TempSrc:      SpO2: 96% 99% 99% 95%    Final diagnoses:  HCAP (healthcare-associated pneumonia)    Admission/ observation were discussed with the admitting physician, patient and/or family and they are comfortable with the plan.   Final Clinical Impression(s) / ED Diagnoses Final diagnoses:  HCAP  (healthcare-associated pneumonia)    Rx / DC Orders ED Discharge Orders    None       Deno Etienne, DO 02/23/20 1214

## 2020-02-23 NOTE — Plan of Care (Signed)
New care plan added 

## 2020-02-24 ENCOUNTER — Inpatient Hospital Stay (HOSPITAL_COMMUNITY): Payer: Medicare HMO

## 2020-02-24 DIAGNOSIS — J90 Pleural effusion, not elsewhere classified: Principal | ICD-10-CM

## 2020-02-24 DIAGNOSIS — R079 Chest pain, unspecified: Secondary | ICD-10-CM

## 2020-02-24 DIAGNOSIS — R0602 Shortness of breath: Secondary | ICD-10-CM

## 2020-02-24 LAB — BASIC METABOLIC PANEL
Anion gap: 11 (ref 5–15)
BUN: 8 mg/dL (ref 8–23)
CO2: 23 mmol/L (ref 22–32)
Calcium: 8.3 mg/dL — ABNORMAL LOW (ref 8.9–10.3)
Chloride: 106 mmol/L (ref 98–111)
Creatinine, Ser: 0.7 mg/dL (ref 0.44–1.00)
GFR calc Af Amer: >60 mL/min (ref >60)
GFR calc non Af Amer: >60 mL/min (ref >60)
Glucose, Bld: 99 mg/dL (ref 70–99)
Potassium: 3.7 mmol/L (ref 3.5–5.1)
Sodium: 140 mmol/L (ref 135–145)

## 2020-02-24 LAB — BODY FLUID CELL COUNT WITH DIFFERENTIAL
Eos, Fluid: 0 %
Lymphs, Fluid: 4 %
Monocyte-Macrophage-Serous Fluid: 28 % — ABNORMAL LOW (ref 50–90)
Neutrophil Count, Fluid: 68 % — ABNORMAL HIGH (ref 0–25)
Total Nucleated Cell Count, Fluid: 1760 cu mm — ABNORMAL HIGH (ref 0–1000)

## 2020-02-24 LAB — ALBUMIN: Albumin: 2.4 g/dL — ABNORMAL LOW (ref 3.5–5.0)

## 2020-02-24 LAB — LACTATE DEHYDROGENASE
LDH: 131 U/L (ref 98–192)
LDH: 186 U/L (ref 98–192)

## 2020-02-24 LAB — CBC
HCT: 37.6 % (ref 36.0–46.0)
Hemoglobin: 12.7 g/dL (ref 12.0–15.0)
MCH: 28.4 pg (ref 26.0–34.0)
MCHC: 33.8 g/dL (ref 30.0–36.0)
MCV: 84.1 fL (ref 80.0–100.0)
Platelets: 233 10*3/uL (ref 150–400)
RBC: 4.47 MIL/uL (ref 3.87–5.11)
RDW: 12.8 % (ref 11.5–15.5)
WBC: 7.5 10*3/uL (ref 4.0–10.5)
nRBC: 0 % (ref 0.0–0.2)

## 2020-02-24 LAB — PROTEIN, PLEURAL OR PERITONEAL FLUID: Total protein, fluid: 3.2 g/dL

## 2020-02-24 LAB — LACTATE DEHYDROGENASE, PLEURAL OR PERITONEAL FLUID: LD, Fluid: 184 U/L — ABNORMAL HIGH (ref 3–23)

## 2020-02-24 LAB — GRAM STAIN

## 2020-02-24 LAB — PROTEIN, TOTAL: Total Protein: 6.7 g/dL (ref 6.5–8.1)

## 2020-02-24 LAB — ECHOCARDIOGRAM COMPLETE
Height: 62 in
Weight: 2211.2 oz

## 2020-02-24 LAB — BRAIN NATRIURETIC PEPTIDE: B Natriuretic Peptide: 64.3 pg/mL (ref 0.0–100.0)

## 2020-02-24 LAB — ALBUMIN, PLEURAL OR PERITONEAL FLUID: Albumin, Fluid: 1.8 g/dL

## 2020-02-24 MED ORDER — LIDOCAINE HCL (PF) 1 % IJ SOLN
INTRAMUSCULAR | Status: AC
  Filled 2020-02-24: qty 10

## 2020-02-24 MED ORDER — SODIUM CHLORIDE 0.9 % IV SOLN
2.0000 g | INTRAVENOUS | Status: DC
  Administered 2020-02-24 – 2020-02-25 (×2): 2 g via INTRAVENOUS
  Filled 2020-02-24 (×2): qty 20

## 2020-02-24 NOTE — Progress Notes (Signed)
  Echocardiogram 2D Echocardiogram has been performed.  Darlina Sicilian M 02/24/2020, 10:25 AM

## 2020-02-24 NOTE — Plan of Care (Signed)

## 2020-02-24 NOTE — Procedures (Signed)
Interventional Radiology Procedure Note  Procedure: [place] LEFT THORACENTESIS  Complications: None  Estimated Blood Loss: 0  Findings: 370CC REMOVED, LABS SENT

## 2020-02-24 NOTE — Progress Notes (Signed)
PROGRESS NOTE                                                                                                                                                                                                             Patient Demographics:    Jasmine Bradford, is a 64 y.o. female, DOB - 06/17/1956, UMP:[id number]  Admit date - 02/23/2020   Admitting Physician Lequita Halt, MD  Outpatient Primary MD for the patient is Cirigliano, Garvin Fila, DO  LOS - 1   Chief Complaint  Patient presents with  . Shortness of Breath  . Chest Pain       Brief Narrative    HPI: Jasmine Bradford is a 64 y.o. female with medical history significant of recurrent choledocholithiasis status post ERCP/biliary sphincterotomy/sphincteroplasty/stone extraction on 6/30/2021cholecystectomy presented with increasing short of breath and left-sided chest pain. On last admission, patient was diagnosed with Klebsiella pneumonia bacteremia which was conservatively related to biliary infection. Patient was discharged with Keflex. Increasing, patient started to feel left-sided sharp-like pleural chest pain worsening with chest movement and deep breath and cough, denies any fever chills. Meantime she started to feel worsening of short of breath, she could barely walk 5 minutes then started to feel strong feeling short of breath. No diarrhea, no abdominal pain. ED Course: CTA negative for PE positive for bilateral new onset pleural effusion moderate amount on the left side. WBC 7.0   Subjective:    Jasmine Bradford today has, No headache, she does report sporadic chest pain with movement and activity, she does report mild dyspnea with exertion as well. .   Assessment  & Plan :    Active Problems:   Pleural effusion   HCAP (healthcare-associated pneumonia)   Pleural effusion -New onset, appears to be bilateral, left> right, symptomatic with pain, and dyspnea with activity, so far no clear  etiology. -Ultrasound-guided paracentesis is pending for today, will to differentiate between transudative versus exudative. -Patient with recent gram-negative bacteremia, so we will continue with IV Rocephin to cover empirically for parapneumonic effusion. -Does not appear to be clinically having CHF, as well with no leg edema, BNP within normal limits, but will follow on her 2D echo. -We will get her incentive spirometry, and she was encouraged to keep using it.   [place]  02/24/20 0416  LDH 131    Lab Results  Component Value Date   SARSCOV2NAA NEGATIVE 02/23/2020   [place] NEGATIVE 02/18/2020     Code Status : Full  Family Communication  : None at bedside  Disposition Plan  :  Status is: Inpatient  Remains inpatient appropriate because:IV treatments appropriate due to intensity of illness or inability to take PO   Dispo: The patient is from: Home              Anticipated d/c is to: Home              Anticipated d/c date is: 1 day              Patient currently is not medically stable to d/c.        Barriers For Discharge :   Consults  :  none  Procedures  : none  DVT Prophylaxis  :  [place] lovenox  Lab Results  Component Value Date   PLT 233 02/24/2020    Antibiotics  :    Anti-infectives (From admission, onward)   Start     Dose/Rate Route Frequency Ordered Stop   02/24/20 1400  cefTRIAXone (ROCEPHIN) 2 g in sodium chloride 0.9 % 100 mL IVPB     Discontinue     2 g 200 mL/hr over 30 Minutes Intravenous Every 24 hours 02/24/20 1204     02/23/20 1530  cefTRIAXone (ROCEPHIN) 1 g in sodium chloride 0.9 % 100 mL IVPB  Status:  Discontinued        1 g 200 mL/hr over 30 Minutes Intravenous Every 24 hours 02/23/20 1518 02/24/20 1204   02/23/20 1215  vancomycin (VANCOREADY) IVPB 1250 mg/250 mL        1,250 mg 166.7 mL/hr over 90 Minutes Intravenous  Once 02/23/20 1211 02/23/20 1445   02/23/20 1215  piperacillin-tazobactam (ZOSYN)  IVPB 3.375 g        3.375 g 100 mL/hr over 30 Minutes Intravenous  Once 02/23/20 1211 02/23/20 1414        Objective:   Vitals:   02/23/20 1521 02/23/20 1544 02/23/20 1934 02/24/20 0804  BP: 99/61 (!) 102/47 103/61 117/71  Pulse:  78 70 78  Resp: 18 19 14 19   Temp: 98.4 F (36.9 C) 98.6 F (37 C) 98.1 F (36.7 C) 98.3 F (36.8 C)  TempSrc: Oral  Oral   SpO2: 94% 95% 96% 92%  Weight:      Height:        Wt Readings from Last 3 Encounters:  02/23/20 62.7 kg  02/20/20 62.7 kg  11/08/19 59.1 kg    No intake or output data in the 24 hours ending 02/24/20 1237   Physical Exam  Awake Alert, Oriented X 3, No new F.N deficits, Normal affect Symmetrical Chest wall movement, diminished air entry at the bases bilaterally RRR,No Gallops,Rubs or new Murmurs, No Parasternal Heave +ve B.Sounds, Abd Soft, No tenderness, No organomegaly appriciated, No rebound - guarding or rigidity. No Cyanosis, Clubbing or edema, No new Rash or bruise     Data Review:    CBC Recent Labs  Lab 02/19/20 0341 02/20/20 0411 02/21/20 0241 02/23/20 1030 02/24/20 0416  WBC 12.4* 10.2 11.3* 7.0 7.5  HGB 11.8* 10.5* 10.3* 14.9 12.7  HCT 35.5* 32.0* 30.9* 44.6 37.6  PLT 142* 126* 150 227 233  MCV 86.0 85.6 85.1 83.8 84.1  MCH 28.6 28.1 28.4 28.0 28.4  MCHC 33.2 32.8 33.3 33.4 33.8  RDW 12.9 13.0 12.9 12.7 12.8  LYMPHSABS  --  0.5* 1.2 1.8  --   MONOABS  --  0.2 0.4 0.4  --   EOSABS  --  0.0 0.0 0.1  --   BASOSABS  --  0.0 0.0 0.1  --     Chemistries  Recent Labs  Lab 02/18/20 0632 02/18/20 1008 02/18/20 1530 02/19/20 0341 02/20/20 0411 02/21/20 0241 02/23/20 1030 02/24/20 0416  NA   < >  --   --  140 139 141 138 140  K   < >  --   --  3.7 4.0 3.5 3.2* 3.7  CL   < >  --   --  107 111 109 102 106  CO2   < >  --   --  24 22 24  21* 23  GLUCOSE   < >  --   --  98 111* 102* 102* 99  BUN   < >  --   --  17 15 14 11 8   CREATININE   < >  --   --  0.91 0.77 0.87 0.77 0.70  CALCIUM   <  >  --   --  8.0* 8.2* 8.0* 8.8* 8.3*  MG  --   --  1.6*  --  2.1 1.8  --   --   AST  --  2,102*  --  538* 157* 57* 33  --   ALT  --  1,343*  --  750* 453* 287* 165*  --   ALKPHOS  --  154*  --  104 91 89 107  --   BILITOT  --  2.9*  --  4.8* 1.9* 1.3* 1.8*  --    < > = values in this interval not displayed.   ------------------------------------------------------------------------------------------------------------------ No results for input(s): CHOL, HDL, LDLCALC, TRIG, CHOLHDL, LDLDIRECT in the last 72 hours.  Lab Results  Component Value Date   HGBA1C 5.5 03/04/2016   ------------------------------------------------------------------------------------------------------------------ No results for input(s): TSH, T4TOTAL, T3FREE, THYROIDAB in the last 72 hours.  Invalid input(s): FREET3 ------------------------------------------------------------------------------------------------------------------ No results for input(s): VITAMINB12, FOLATE, FERRITIN, TIBC, IRON, RETICCTPCT in the last 72 hours.  Coagulation profile Recent Labs  Lab 02/18/20 1057  INR 1.2    No results for input(s): DDIMER in the last 72 hours.  Cardiac Enzymes No results for input(s): CKMB, TROPONINI, MYOGLOBIN in the last 168 hours.  Invalid input(s): CK ------------------------------------------------------------------------------------------------------------------    Component Value Date/Time   BNP 64.3 02/24/2020 0416    Inpatient Medications  Scheduled Meds: . enoxaparin (LOVENOX) injection  40 mg Subcutaneous Q24H  . loratadine  10 mg Oral Daily  . multivitamin with minerals  1 tablet Oral Q lunch  . pantoprazole  40 mg Oral Daily  . polyvinyl alcohol  1 drop Both Eyes Daily   Continuous Infusions: . cefTRIAXone (ROCEPHIN)  IV     PRN Meds:.diphenhydrAMINE, HYDROmorphone, ibuprofen, ondansetron  Micro Results Recent Results (from the past 240 hour(s))  Blood culture (routine x 2)      Status: Abnormal   Collection Time: 02/18/20 10:18 AM   Specimen: BLOOD  Result Value Ref Range Status   Specimen Description BLOOD RIGHT ANTECUBITAL  Final   Special Requests   Final    BOTTLES DRAWN AEROBIC AND ANAEROBIC Blood Culture adequate volume   Culture  Setup Time   Final    GRAM NEGATIVE RODS IN BOTH AEROBIC AND ANAEROBIC BOTTLES CRITICAL RESULT CALLED TO, READ BACK BY AND VERIFIED  WITH: PHARMD LAURA SEAY AT 0255 BY MESSAN H. ON 02/19/2020 Performed at [facility] [address] [place], [place] [address]    Culture KLEBSIELLA PNEUMONIAE (A)  Final   Report Status 02/21/2020 FINAL  Final   Organism ID, Bacteria KLEBSIELLA PNEUMONIAE  Final      Susceptibility   Klebsiella pneumoniae - MIC*    AMPICILLIN >=32 RESISTANT Resistant     CEFAZOLIN <=4 SENSITIVE Sensitive     CEFEPIME <=0.12 SENSITIVE Sensitive     CEFTAZIDIME <=1 SENSITIVE Sensitive     CEFTRIAXONE <=0.25 SENSITIVE Sensitive     CIPROFLOXACIN <=0.25 SENSITIVE Sensitive     GENTAMICIN <=1 SENSITIVE Sensitive     IMIPENEM 1 SENSITIVE Sensitive     TRIMETH/SULFA <=20 SENSITIVE Sensitive     AMPICILLIN/SULBACTAM 16 INTERMEDIATE Intermediate     PIP/TAZO <=4 SENSITIVE Sensitive     * KLEBSIELLA PNEUMONIAE  Blood Culture ID Panel (Reflexed)     Status: Abnormal   Collection Time: 02/18/20 10:18 AM  Result Value Ref Range Status   Enterococcus species NOT DETECTED NOT DETECTED Final   Listeria monocytogenes NOT DETECTED NOT DETECTED Final   Staphylococcus species NOT DETECTED NOT DETECTED Final   Staphylococcus aureus (BCID) NOT DETECTED NOT DETECTED Final   Streptococcus species NOT DETECTED NOT DETECTED Final   Streptococcus agalactiae NOT DETECTED NOT DETECTED Final   Streptococcus pneumoniae NOT DETECTED NOT DETECTED Final   Streptococcus pyogenes NOT DETECTED NOT DETECTED Final   Acinetobacter baumannii NOT DETECTED NOT DETECTED Final   Enterobacteriaceae species DETECTED (A) NOT DETECTED Final      Comment: Enterobacteriaceae represent a large family of gram-negative bacteria, not a single organism. CRITICAL RESULT CALLED TO, READ BACK BY AND VERIFIED WITH: PHARMD LAURA SEAY AT 0255 BY MESSAN H ON 02/19/2020    Enterobacter cloacae complex NOT DETECTED NOT DETECTED Final   Escherichia coli NOT DETECTED NOT DETECTED Final   Klebsiella oxytoca NOT DETECTED NOT DETECTED Final   Klebsiella pneumoniae DETECTED (A) NOT DETECTED Final    Comment: CRITICAL RESULT CALLED TO, READ BACK BY AND VERIFIED WITH: PHARMD LAURA SEAY AT 0255 BY MESSAN H ON 02/19/2020    Proteus species NOT DETECTED NOT DETECTED Final   Serratia marcescens NOT DETECTED NOT DETECTED Final   Carbapenem resistance NOT DETECTED NOT DETECTED Final   Haemophilus influenzae NOT DETECTED NOT DETECTED Final   Neisseria meningitidis NOT DETECTED NOT DETECTED Final   Pseudomonas aeruginosa NOT DETECTED NOT DETECTED Final   Candida albicans NOT DETECTED NOT DETECTED Final   Candida glabrata NOT DETECTED NOT DETECTED Final   Candida krusei NOT DETECTED NOT DETECTED Final   Candida parapsilosis NOT DETECTED NOT DETECTED Final   Candida tropicalis NOT DETECTED NOT DETECTED Final    Comment: Performed at [facility], [place] [address] [place], [place] [address]  Blood culture (routine x 2)     Status: Abnormal   Collection Time: 02/18/20 10:30 AM   Specimen: BLOOD RIGHT HAND  Result Value Ref Range Status   Specimen Description BLOOD RIGHT HAND  Final   Special Requests   Final    BOTTLES DRAWN AEROBIC AND ANAEROBIC Blood Culture adequate volume   Culture  Setup Time   Final    IN BOTH AEROBIC AND ANAEROBIC BOTTLES GRAM NEGATIVE RODS CRITICAL VALUE NOTED.  VALUE IS CONSISTENT WITH PREVIOUSLY REPORTED AND CALLED VALUE.    Culture (A)  Final    KLEBSIELLA PNEUMONIAE SUSCEPTIBILITIES PERFORMED ON PREVIOUS CULTURE WITHIN  THE LAST 5 DAYS. Performed at [facility], [place] [address] [place], [place] [address]     Report Status 02/21/2020 FINAL  Final  SARS Coronavirus 2 by RT PCR (hospital order, performed in [facility] hospital lab) Nasopharyngeal Nasopharyngeal Swab     Status: None   Collection Time: 02/18/20  2:12 PM   Specimen: Nasopharyngeal Swab  Result Value Ref Range Status   SARS Coronavirus 2 NEGATIVE NEGATIVE Final    Comment: (NOTE) SARS-CoV-2 target nucleic acids are NOT DETECTED.  The SARS-CoV-2 RNA is generally detectable in upper and lower respiratory specimens during the acute phase of infection. The lowest concentration of SARS-CoV-2 viral copies this assay can detect is 250 copies / mL. A negative result does not preclude SARS-CoV-2 infection and should not be used as the sole basis for treatment or other patient management decisions.  A negative result may occur with improper specimen collection / handling, submission of specimen other than nasopharyngeal swab, presence of viral mutation(s) within the areas targeted by this assay, and inadequate number of viral copies (<250 copies / mL). A negative result must be combined with clinical observations, patient history, and epidemiological information.  Fact Sheet for Patients:   StrictlyIdeas.no  Fact Sheet for Healthcare Providers: BankingDealers.co.za  This test is not yet approved or  cleared by the [place] FDA and has been authorized for detection and/or diagnosis of SARS-CoV-2 by FDA under an Emergency Use Authorization (EUA).  This EUA will remain in effect (meaning this test can be used) for the duration of the COVID-19 declaration under Section 564(b)(1) of the Act, 21 U.S.C. section 360bbb-3(b)(1), unless the authorization is terminated or revoked sooner.  Performed at [facility], [place] [address], [place], [place] [address]   Urine culture     Status: None   Collection Time: 02/18/20  2:30 PM   Specimen: Urine, Random  Result Value Ref Range Status     Specimen Description URINE, RANDOM  Final   Special Requests NONE  Final   Culture   Final    NO GROWTH Performed at [facility], [place] [address], [place], [place] [address]    Report Status 02/19/2020 FINAL  Final  Blood culture (routine x 2)     Status: None (Preliminary result)   Collection Time: 02/23/20 12:35 PM   Specimen: BLOOD RIGHT HAND  Result Value Ref Range Status   Specimen Description BLOOD RIGHT HAND  Final   Special Requests   Final    BOTTLES DRAWN AEROBIC AND ANAEROBIC Blood Culture results may not be optimal due to an inadequate volume of blood received in culture bottles   Culture   Final    NO GROWTH < 24 HOURS Performed at [facility], [place] [address] [place], [place] [address]    Report Status PENDING  Incomplete  Blood culture (routine x 2)     Status: None (Preliminary result)   Collection Time: 02/23/20 12:43 PM   Specimen: BLOOD  Result Value Ref Range Status   Specimen Description BLOOD LEFT ANTECUBITAL  Final   Special Requests   Final    BOTTLES DRAWN AEROBIC AND ANAEROBIC Blood Culture results may not be optimal due to an excessive volume of blood received in culture bottles   Culture   Final    NO GROWTH < 24 HOURS Performed at [facility], [place] [address] [place], [place] [address]    Report Status PENDING  Incomplete  SARS Coronavirus 2 by RT PCR (hospital order, performed in [facility] hospital lab) Nasopharyngeal Nasopharyngeal Swab     Status: None   Collection Time: 02/23/20  2:20 PM   Specimen: Nasopharyngeal Swab  Result Value Ref Range Status   SARS Coronavirus 2 NEGATIVE NEGATIVE Final    Comment: (NOTE) SARS-CoV-2 target nucleic acids are NOT DETECTED.  The SARS-CoV-2 RNA is generally detectable in upper and lower respiratory specimens during the acute phase of infection. The lowest concentration of SARS-CoV-2 viral copies this assay can detect is 250 copies / mL. A negative result does not preclude  SARS-CoV-2 infection and should not be used as the sole basis for treatment or other patient management decisions.  A negative result may occur with improper specimen collection / handling, submission of specimen other than nasopharyngeal swab, presence of viral mutation(s) within the areas targeted by this assay, and inadequate number of viral copies (<250 copies / mL). A negative result must be combined with clinical observations, patient history, and epidemiological information.  Fact Sheet for Patients:   StrictlyIdeas.no  Fact Sheet for Healthcare Providers: BankingDealers.co.za  This test is not yet approved or  cleared by the [place] FDA and has been authorized for detection and/or diagnosis of SARS-CoV-2 by FDA under an Emergency Use Authorization (EUA).  This EUA will remain in effect (meaning this test can be used) for the duration of the COVID-19 declaration under Section 564(b)(1) of the Act, 21 U.S.C. section 360bbb-3(b)(1), unless the authorization is terminated or revoked sooner.  Performed at [facility], [place] [address] [place], [place] [address]     Radiology Reports DG Chest 2 View  Result Date: 02/23/2020 CLINICAL DATA:  Shortness of breath, LEFT-sided chest pain. EXAM: CHEST - 2 VIEW COMPARISON:  02/18/2020 FINDINGS: Trachea is midline. Cardiomediastinal contours are stable accounting for partial obscuration of LEFT heart border and LEFT hemidiaphragm which has developed since the prior study. Hilar structures are normal. Basilar airspace disease and blunting of LEFT and RIGHT costodiaphragmatic sulci worse on the LEFT extending into the lower third of the LEFT chest. No additional areas of airspace consolidation. On limited assessment visualized skeletal structures are unremarkable. IMPRESSION: 1. New bibasilar airspace disease and effusions, worse on the LEFT. 2. Findings may represent developing pneumonia  or sequela of aspiration. Electronically Signed   By: Zetta Bills M.D.   On: 02/23/2020 10:52   DG Chest 2 View  Result Date: 02/18/2020 CLINICAL DATA:  Chest pain. EXAM: CHEST - 2 VIEW COMPARISON:  03/03/2016. FINDINGS: Mediastinum hilar structures normal. Heart size normal. Low lung volumes. Mild bibasilar atelectasis/infiltrates. No pleural effusion or pneumothorax. Mild thoracic spine scoliosis. IMPRESSION: Low lung volumes with mild bibasilar atelectasis/infiltrates. Electronically Signed   By: Marcello Moores  Register   On: 02/18/2020 07:21   CT Angio Chest PE W and/or Wo Contrast  Result Date: 02/23/2020 CLINICAL DATA:  Shortness of breath.  Assess for pulmonary embolus. EXAM: CT ANGIOGRAPHY CHEST WITH CONTRAST TECHNIQUE: Multidetector CT imaging of the chest was performed using the standard protocol during bolus administration of intravenous contrast. Multiplanar CT image reconstructions and MIPs were obtained to evaluate the vascular anatomy. CONTRAST:  39mL OMNIPAQUE IOHEXOL 350 MG/ML SOLN COMPARISON:  None. Chest x-ray July 4th 2021 FINDINGS: Cardiovascular: Satisfactory opacification of the pulmonary arteries to the segmental level. No evidence of pulmonary embolism. Normal heart size. No pericardial effusion. Mediastinum/Nodes: No enlarged mediastinal, hilar, or axillary lymph nodes. Thyroid gland, trachea, and esophagus demonstrate no significant findings. Lungs/Pleura:  Moderate bilateral pleural effusions are identified. Consolidation with air bronchograms are identified in bilateral lower lobe in part due to compression atelectasis but superimposed pneumonia is not excluded. Upper Abdomen: Status post prior cholecystectomy with intra hepatic biliary air. Musculoskeletal: Degenerative joint changes of the spine are noted. Review of the MIP images confirms the above findings. IMPRESSION: 1. No pulmonary embolus. 2. Moderate bilateral pleural effusions. Consolidation with air bronchograms are  identified in bilateral lower lobe in part due to compression atelectasis but superimposed pneumonia is not excluded. Electronically Signed   By: Abelardo Diesel M.D.   On: 02/23/2020 11:52   CT ABDOMEN PELVIS W CONTRAST  Result Date: 02/18/2020 CLINICAL DATA:  Left-sided abdominal pain. EXAM: CT ABDOMEN AND PELVIS WITH CONTRAST TECHNIQUE: Multidetector CT imaging of the abdomen and pelvis was performed using the standard protocol following bolus administration of intravenous contrast. CONTRAST:  174mL OMNIPAQUE IOHEXOL 300 MG/ML  SOLN COMPARISON:  06/05/2016 FINDINGS: Lower chest: Slight atelectasis at both lung bases posteriorly with slight bronchiectasis at the left lung base. Small hiatal hernia. 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 Cholecystectomy. No liver masses. Pancreas: Unremarkable. No pancreatic ductal dilatation or surrounding inflammatory changes. Spleen: Normal in size without focal abnormality. Adrenals/Urinary Tract: Adrenal glands are unremarkable. Kidneys are normal, without renal calculi, focal lesion, or hydronephrosis. Bladder is unremarkable. Stomach/Bowel: Chronic small hiatal hernia. Stomach is otherwise normal. Small bowel appears normal including the terminal ileum. Appendix is normal. There are few diverticula in the distal descending and proximal sigmoid portions of the colon without evidence of diverticulitis. Vascular/Lymphatic: Aortic atherosclerosis. No enlarged abdominal or pelvic lymph nodes. Reproductive: Chronic 3.6 cm cyst on the right ovary, unchanged. Uterus and left ovary appear normal. Surgical clips in both adnexa consistent with previous tubal ligation. Other: There is a tiny amount of ascites in the right pericolic gutter adjacent to the inferior tip of the right lobe of the liver. No abdominal wall hernia. Musculoskeletal: No acute bone abnormality. Narrowing of the joint space of the left hip. Degenerative disc disease at  L5-S1. IMPRESSION: 1. Tiny amount of ascites in the right pericolic gutter adjacent to the inferior tip of the right lobe of the liver. 2. Diverticulosis of the distal descending and proximal sigmoid portions of the colon. 3. 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 4. Chronic small hiatal hernia. 5. Chronic 3.6 cm cyst on the right ovary, unchanged. 6. Aortic atherosclerosis. Aortic Atherosclerosis (ICD10-I70.0). Electronically Signed   By: Lorriane Shire M.D.   On: 02/18/2020 12:20   DG ERCP  Result Date: 02/19/2020 CLINICAL DATA:  64 year old female with a history of choledocholithiasis EXAM: ERCP TECHNIQUE: Multiple spot images obtained with the fluoroscopic device and submitted for interpretation post-procedure. FLUOROSCOPY TIME:  Fluoroscopy Time: 1 minutes 37 seconds COMPARISON:  MR 02/18/2020 FINDINGS: Limited images of ERCP. Initial image demonstrates endoscope projecting over the upper abdomen. There is gas within the extrahepatic biliary ductal system. Subsequently there is cannulation of the ampulla with a safety wire and partial opacification of the extrahepatic and intrahepatic biliary ductal system. Filling defects present within the ductal system with subsequently deployment of balloon retrieval catheter. IMPRESSION: Limited images during ERCP demonstrates treatment of choledocholithiasis with balloon retrieval catheter. Please refer to the dictated operative report for full details of intraoperative findings and procedure. Electronically Signed   By: Corrie Mckusick D.O.   On: 02/19/2020 17:12   MR ABDOMEN MRCP W WO CONTAST  Result Date: 02/19/2020 CLINICAL DATA:  Acute abdominal  pain and jaundice. EXAM: MRI ABDOMEN WITHOUT AND WITH CONTRAST (INCLUDING MRCP) TECHNIQUE: Multiplanar multisequence MR imaging of the abdomen was performed both before and after the administration of intravenous contrast. Heavily T2-weighted images of the biliary and pancreatic ducts were  obtained, and three-dimensional MRCP images were rendered by post processing. CONTRAST:  66mL GADAVIST GADOBUTROL 1 MMOL/ML IV SOLN COMPARISON:  CT scan and ultrasound same date. FINDINGS: Lower chest: Small left pleural effusion and bibasilar atelectasis. Hepatobiliary: Moderate intrahepatic biliary dilatation appears relatively stable. No worrisome hepatic lesions. Dilated common bile duct in the porta hepatis measuring up to 13 mm. Abrupt caliber change and findings consistent with common bile duct stones. The largest measures approximately 6.5 mm. The distal common bile duct near the ampulla is normal in caliber measuring 3.4 mm. Pancreas:  No mass, inflammation or ductal dilatation. Spleen:  Normal size. No focal lesions. Adrenals/Urinary Tract: The adrenal glands and kidneys are unremarkable. No worrisome renal lesions or hydronephrosis. Stomach/Bowel: Stomach, duodenum, visualized small bowel and visualized colon are unremarkable. Vascular/Lymphatic: The aorta and branch vessels are patent. The major venous structures are patent. No mesenteric or retroperitoneal mass or adenopathy. Other:  Small amount perihepatic ascites. Musculoskeletal: No significant bony findings. IMPRESSION: 1. Stable appearing chronic intra and extrahepatic biliary dilatation. Common bile duct stones are present however and likely causing some degree of obstruction. Recommend ERCP. 2. No abdominal mass lesions or adenopathy. 3. Small amount of free abdominal fluid. 4. Small left pleural effusion and bibasilar atelectasis. These results will be called to the ordering clinician or representative by the Radiologist Assistant, and communication documented in the PACS or [employer]. Electronically Signed   By: Marijo Sanes M.D.   On: 02/19/2020 05:19   US Abdomen Limited RUQ  Result Date: 02/18/2020 CLINICAL DATA:  ELEVATED LIVER ENZYMES.  CHOLECYSTECTOMY. EXAM: ULTRASOUND ABDOMEN LIMITED RIGHT UPPER QUADRANT COMPARISON:  CT SCAN  DATED 02/18/2020 FINDINGS: Gallbladder: Removed. Common bile duct: Diameter: 10 mm, within normal limits for a post cholecystectomy patient. No visible mass or stone in the common duct. Liver: No focal lesion identified. There is intrahepatic ductal dilatation as demonstrated on the prior CT scan but this is chronic. Pneumobilia is also demonstrated on this exam and on the prior CT scan. Within normal limits in parenchymal echogenicity. Portal vein is patent on color Doppler imaging with normal direction of blood flow towards the liver. Other: Small amount of ascites. The patient had a prior ERCP performed on 06/12/2018 which demonstrated the dilated bile ducts. IMPRESSION: No acute abnormality. 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 Electronically Signed   By: Lorriane Shire M.D.   On: 02/18/2020 14:04      Phillips Climes M.D on 02/24/2020 at 12:37 PM    [facility] -  Office  [phone]

## 2020-02-25 LAB — CBC
HCT: 38.3 % (ref 36.0–46.0)
Hemoglobin: 12.4 g/dL (ref 12.0–15.0)
MCH: 27.6 pg (ref 26.0–34.0)
MCHC: 32.4 g/dL (ref 30.0–36.0)
MCV: 85.1 fL (ref 80.0–100.0)
Platelets: 267 10*3/uL (ref 150–400)
RBC: 4.5 MIL/uL (ref 3.87–5.11)
RDW: 12.7 % (ref 11.5–15.5)
WBC: 8.3 10*3/uL (ref 4.0–10.5)
nRBC: 0 % (ref 0.0–0.2)

## 2020-02-25 LAB — BASIC METABOLIC PANEL
Anion gap: 8 (ref 5–15)
BUN: 7 mg/dL — ABNORMAL LOW (ref 8–23)
CO2: 28 mmol/L (ref 22–32)
Calcium: 8.4 mg/dL — ABNORMAL LOW (ref 8.9–10.3)
Chloride: 102 mmol/L (ref 98–111)
Creatinine, Ser: 0.87 mg/dL (ref 0.44–1.00)
GFR calc Af Amer: >60 mL/min (ref >60)
GFR calc non Af Amer: >60 mL/min (ref >60)
Glucose, Bld: 108 mg/dL — ABNORMAL HIGH (ref 70–99)
Potassium: 3.7 mmol/L (ref 3.5–5.1)
Sodium: 138 mmol/L (ref 135–145)

## 2020-02-25 MED ORDER — CEPHALEXIN 500 MG PO CAPS: 500.0000 mg | ORAL_CAPSULE | Freq: Three times a day (TID) | ORAL | 0 refills | Status: AC

## 2020-02-25 NOTE — Discharge Instructions (Signed)
Follow with Primary MD Ronnald Nian, DO in 10 days   Get CBC, CMP, 2 view Chest X ray checked  by Primary MD next visit.    Activity: As tolerated with Full fall precautions use walker/cane & assistance as needed   Disposition Home    Diet: Regular diet   On your next visit with your primary care physician please Get Medicines reviewed and adjusted.   Please request your Prim.MD to go over all Hospital Tests and Procedure/Radiological results at the follow up, please get all Hospital records sent to your Prim MD by signing hospital release before you go home.   If you experience worsening of your admission symptoms, develop shortness of breath, life threatening emergency, suicidal or homicidal thoughts you must seek medical attention immediately by calling 911 or calling your MD immediately  if symptoms less severe.  You Must read complete instructions/literature along with all the possible adverse reactions/side effects for all the Medicines you take and that have been prescribed to you. Take any new Medicines after you have completely understood and accpet all the possible adverse reactions/side effects.   Do not drive, operating heavy machinery, perform activities at heights, swimming or participation in water activities or provide baby sitting services if your were admitted for syncope or siezures until you have seen by Primary MD or a Neurologist and advised to do so again.  Do not drive when taking Pain medications.    Do not take more than prescribed Pain, Sleep and Anxiety Medications  Special Instructions: If you have smoked or chewed Tobacco  in the last 2 yrs please stop smoking, stop any regular Alcohol  and or any Recreational drug use.  Wear Seat belts while driving.   Please note  You were cared for by a hospitalist during your hospital stay. If you have any questions about your discharge medications or the care you received while you were in the hospital  after you are discharged, you can call the unit and asked to speak with the hospitalist on call if the hospitalist that took care of you is not available. Once you are discharged, your primary care physician will handle any further medical issues. Please note that NO REFILLS for any discharge medications will be authorized once you are discharged, as it is imperative that you return to your primary care physician (or establish a relationship with a primary care physician if you do not have one) for your aftercare needs so that they can reassess your need for medications and monitor your lab values.

## 2020-02-25 NOTE — Discharge Summary (Signed)
Jasmine Bradford, is a 64 y.o. female  DOB [birth year]  MRN [id number].  Admission date:  02/23/2020  Admitting Physician  Lequita Halt, MD  Discharge Date:  02/25/2020   Primary MD  Ronnald Nian, DO  Recommendations for primary care physician for things to follow:  - please check CBC, CMP during next vsit. - please repeat 2 view chest x-ray in 3 to 4 weeks to ensure resolution of pleural effusion, if persistent or no resolution, then will need to follow with pulmonary as an outpatient. -Please follow on the final results of pleural effusion cultures   Admission Diagnosis  Pleural effusion [J90] HCAP (healthcare-associated pneumonia) [J18.9]   Discharge Diagnosis  Pleural effusion [J90] HCAP (healthcare-associated pneumonia) [J18.9]    Active Problems:   Pleural effusion   HCAP (healthcare-associated pneumonia)      Past Medical History:  Diagnosis Date  . Cholelithiasis with choledocholithiasis   . Diverticulitis   . Tobacco abuse   . Varicose vein of leg    no problems now  . Wears glasses     Past Surgical History:  Procedure Laterality Date  . BILIARY DILATION  02/19/2020   Procedure: BILIARY DILATION;  Surgeon: Jackquline Denmark, MD;  Location: [facility] ENDOSCOPY;  Service: Endoscopy;;  . CESAREAN SECTION    . CHOLECYSTECTOMY N/A 05/23/2016   Procedure: LAPAROSCOPIC CHOLECYSTECTOMY WITH INTRAOPERATIVE CHOLANGIOGRAM;  Surgeon: Jackolyn Confer, MD;  Location: [place];  Service: General;  Laterality: N/A;  . ENDOSCOPIC RETROGRADE CHOLANGIOPANCREATOGRAPHY (ERCP) WITH PROPOFOL N/A 06/06/2016   Procedure: ENDOSCOPIC RETROGRADE CHOLANGIOPANCREATOGRAPHY (ERCP) WITH PROPOFOL;  Surgeon: Clarene Essex, MD;  Location: [facility] ENDOSCOPY;  Service: Endoscopy;  Laterality: N/A;  . ENDOSCOPIC RETROGRADE CHOLANGIOPANCREATOGRAPHY (ERCP) WITH PROPOFOL N/A 02/19/2020   Procedure: ENDOSCOPIC RETROGRADE  CHOLANGIOPANCREATOGRAPHY (ERCP) WITH PROPOFOL;  Surgeon: Jackquline Denmark, MD;  Location: [facility] ENDOSCOPY;  Service: Endoscopy;  Laterality: N/A;  . ERCP N/A 03/09/2016   Procedure: ENDOSCOPIC RETROGRADE CHOLANGIOPANCREATOGRAPHY (ERCP);  Surgeon: Clarene Essex, MD;  Location: [facility] ENDOSCOPY;  Service: Endoscopy;  Laterality: N/A;  . ERCP N/A 06/12/2018   Procedure: ENDOSCOPIC RETROGRADE CHOLANGIOPANCREATOGRAPHY (ERCP);  Surgeon: Clarene Essex, MD;  Location: [facility] ENDOSCOPY;  Service: Endoscopy;  Laterality: N/A;  . ESOPHAGOGASTRODUODENOSCOPY N/A 06/12/2018   Procedure: ESOPHAGOGASTRODUODENOSCOPY (EGD);  Surgeon: Clarene Essex, MD;  Location: [facility] ENDOSCOPY;  Service: Endoscopy;  Laterality: N/A;  . laser vein surgery     last year  . REMOVAL OF STONES  06/12/2018   Procedure: REMOVAL OF STONES;  Surgeon: Clarene Essex, MD;  Location: [facility] ENDOSCOPY;  Service: Endoscopy;;  . REMOVAL OF STONES  02/19/2020   Procedure: REMOVAL OF STONES;  Surgeon: Jackquline Denmark, MD;  Location: [facility] ENDOSCOPY;  Service: Endoscopy;;  . Joan Mayans  06/12/2018   Procedure: Joan Mayans;  Surgeon: Clarene Essex, MD;  Location: [facility] ENDOSCOPY;  Service: Endoscopy;;  . Joan Mayans  02/19/2020   Procedure: SPHINCTEROTOMY;  Surgeon: Jackquline Denmark, MD;  Location: [place];  Service: Endoscopy;;  . TUBAL LIGATION    . VARICOSE VEIN SURGERY Left  History of present illness and  Hospital Course:     Kindly see H&P for history of present illness and admission details, please review complete Labs, Consult reports and Test reports for all details in brief  HPI  from the history and physical done on the day of admission 02/23/2020  HPI: Jasmine Bradford is a 64 y.o. female with medical history significant of recurrent choledocholithiasis status post ERCP/biliary sphincterotomy/sphincteroplasty/stone extraction on 6/30/2021cholecystectomy presented with increasing short of breath and left-sided chest pain. On last admission, patient  was diagnosed with Klebsiella pneumonia bacteremia which was conservatively related to biliary infection. Patient was discharged with Keflex. Increasing, patient started to feel left-sided sharp-like pleural chest pain worsening with chest movement and deep breath and cough, denies any fever chills. Meantime she started to feel worsening of short of breath, she could barely walk 5 minutes then started to feel strong feeling short of breath. No diarrhea, no abdominal pain. ED Course: CTA negative for PE positive for bilateral new onset pleural effusion moderate amount on the left side. WBC 7.0   Hospital Course   Pleural effusion -New onset, appears to be bilateral, left> right, symptomatic with pain, and dyspnea with activity, patient had ultrasound-guided paracentesis done 7/5, where 370 cc of serosanguineous pleural fluid was removed, pleural fluid analysis showing mixed picture transudative and exudative, where LDH ratio>0.7, but protein ratio<0.5, Gram stain, has been negative, and so far culture with no growth which has been reassuring, it is most likely reactive inflammatory purulent effusion in the setting of her recent cholangitis infection. -Patient was encouraged to keep using incentive spirometry, and to keep active, discussed plan with pulmonary Dr. Einar Grad, patient will need repeat chest x-ray in 3 to 4 weeks to ensure total resolution of her pleural effusion, if not then she will need follow-up with pulmonary as an outpatient to prevent chronic pleural effusion. -Patient was empirically on IV Rocephin during hospital stay pending fluid analysis results especially in the setting of her recent Klebsiella pneumonia bacteremia and cholangitis, she will be discharged on another 5 days of oral Keflex as an outpatient for treatment course, overall her LFTs are trending down during this hospital stay, and she had no complaints of right upper quadrant pain.    Discharge Condition:   Stable   Follow UP   Follow-up Information    Ronnald Nian, DO Follow up in 2 week(s).   Specialty: Family Medicine Contact information: [place] [address] [phone]                 Discharge Instructions  and  Discharge Medications    Discharge Instructions    Discharge instructions   Complete by: As directed    Follow with Primary MD Ronnald Nian, DO in 10 days   Get CBC, CMP, 2 view Chest X ray checked  by Primary MD next visit.    Activity: As tolerated with Full fall precautions use walker/cane & assistance as needed   Disposition Home    Diet: Regular diet   On your next visit with your primary care physician please Get Medicines reviewed and adjusted.   Please request your Prim.MD to go over all Hospital Tests and Procedure/Radiological results at the follow up, please get all Hospital records sent to your Prim MD by signing hospital release before you go home.   If you experience worsening of your admission symptoms, develop shortness of breath, life threatening emergency, suicidal or homicidal thoughts you must  seek medical attention immediately by calling 911 or calling your MD immediately  if symptoms less severe.  You Must read complete instructions/literature along with all the possible adverse reactions/side effects for all the Medicines you take and that have been prescribed to you. Take any new Medicines after you have completely understood and accpet all the possible adverse reactions/side effects.   Do not drive, operating heavy machinery, perform activities at heights, swimming or participation in water activities or provide baby sitting services if your were admitted for syncope or siezures until you have seen by Primary MD or a Neurologist and advised to do so again.  Do not drive when taking Pain medications.    Do not take more than prescribed Pain, Sleep and Anxiety Medications  Special  Instructions: If you have smoked or chewed Tobacco  in the last 2 yrs please stop smoking, stop any regular Alcohol  and or any Recreational drug use.  Wear Seat belts while driving.   Please note  You were cared for by a hospitalist during your hospital stay. If you have any questions about your discharge medications or the care you received while you were in the hospital after you are discharged, you can call the unit and asked to speak with the hospitalist on call if the hospitalist that took care of you is not available. Once you are discharged, your primary care physician will handle any further medical issues. Please note that NO REFILLS for any discharge medications will be authorized once you are discharged, as it is imperative that you return to your primary care physician (or establish a relationship with a primary care physician if you do not have one) for your aftercare needs so that they can reassess your need for medications and monitor your lab values.   Increase activity slowly   Complete by: As directed      Allergies as of 02/25/2020      Reactions   Oxycodone Nausea And Vomiting, Other (See Comments)   Also made patient lightheaded and lethargic      Medication List    TAKE these medications   cephALEXin 500 MG capsule Commonly known as: KEFLEX Take 1 capsule (500 mg total) by mouth 3 (three) times daily for 5 days.   cetirizine 10 MG tablet Commonly known as: ZYRTEC Take 10 mg by mouth daily as needed for allergies.   ibuprofen 200 MG tablet Commonly known as: ADVIL Take 200 mg by mouth every 6 (six) hours as needed for headache or moderate pain.   multivitamin with minerals Tabs tablet Take 1 tablet by mouth daily with lunch.   omeprazole 40 MG capsule Commonly known as: PRILOSEC Take 1 capsule (40 mg total) by mouth daily.   ondansetron 4 MG tablet Commonly known as: ZOFRAN Take 1 tablet (4 mg total) by mouth every 6 (six) hours as needed for nausea.    oxyCODONE 5 MG immediate release tablet Commonly known as: Oxy IR/ROXICODONE Take 1 tablet (5 mg total) by mouth every 6 (six) hours as needed for severe pain.   Theratears 0.25 % Soln Generic drug: Carboxymethylcellulose Sodium Place 1 drop into both eyes daily.         Diet and Activity recommendation: See Discharge Instructions above   Consults obtained -  None   Major procedures and Radiology Reports - PLEASE review detailed and final reports for all details, in brief -      DG Chest 1 View  Result Date: 02/24/2020 CLINICAL DATA:  Shortness of breath, pneumonia, pleural effusion status post left thoracentesis EXAM: CHEST  1 VIEW COMPARISON:  02/23/2020 FINDINGS: Resolution of the left effusion following thoracentesis. No pneumothorax. Residual bibasilar atelectasis/pneumonia. Small right effusion remains. Stable heart size and vascularity. Trachea midline. IMPRESSION: No pneumothorax following left thoracentesis.  See above comment. Electronically Signed   By: Jerilynn Mages.  Shick M.D.   On: 02/24/2020 14:23   DG Chest 2 View  Result Date: 02/23/2020 CLINICAL DATA:  Shortness of breath, LEFT-sided chest pain. EXAM: CHEST - 2 VIEW COMPARISON:  02/18/2020 FINDINGS: Trachea is midline. Cardiomediastinal contours are stable accounting for partial obscuration of LEFT heart border and LEFT hemidiaphragm which has developed since the prior study. Hilar structures are normal. Basilar airspace disease and blunting of LEFT and RIGHT costodiaphragmatic sulci worse on the LEFT extending into the lower third of the LEFT chest. No additional areas of airspace consolidation. On limited assessment visualized skeletal structures are unremarkable. IMPRESSION: 1. New bibasilar airspace disease and effusions, worse on the LEFT. 2. Findings may represent developing pneumonia or sequela of aspiration. Electronically Signed   By: Zetta Bills M.D.   On: 02/23/2020 10:52   DG Chest 2 View  Result Date:  02/18/2020 CLINICAL DATA:  Chest pain. EXAM: CHEST - 2 VIEW COMPARISON:  03/03/2016. FINDINGS: Mediastinum hilar structures normal. Heart size normal. Low lung volumes. Mild bibasilar atelectasis/infiltrates. No pleural effusion or pneumothorax. Mild thoracic spine scoliosis. IMPRESSION: Low lung volumes with mild bibasilar atelectasis/infiltrates. Electronically Signed   By: Marcello Moores  Register   On: 02/18/2020 07:21   CT Angio Chest PE W and/or Wo Contrast  Result Date: 02/23/2020 CLINICAL DATA:  Shortness of breath.  Assess for pulmonary embolus. EXAM: CT ANGIOGRAPHY CHEST WITH CONTRAST TECHNIQUE: Multidetector CT imaging of the chest was performed using the standard protocol during bolus administration of intravenous contrast. Multiplanar CT image reconstructions and MIPs were obtained to evaluate the vascular anatomy. CONTRAST:  21mL OMNIPAQUE IOHEXOL 350 MG/ML SOLN COMPARISON:  None. Chest x-ray July 4th 2021 FINDINGS: Cardiovascular: Satisfactory opacification of the pulmonary arteries to the segmental level. No evidence of pulmonary embolism. Normal heart size. No pericardial effusion. Mediastinum/Nodes: No enlarged mediastinal, hilar, or axillary lymph nodes. Thyroid gland, trachea, and esophagus demonstrate no significant findings. Lungs/Pleura: Moderate bilateral pleural effusions are identified. Consolidation with air bronchograms are identified in bilateral lower lobe in part due to compression atelectasis but superimposed pneumonia is not excluded. Upper Abdomen: Status post prior cholecystectomy with intra hepatic biliary air. Musculoskeletal: Degenerative joint changes of the spine are noted. Review of the MIP images confirms the above findings. IMPRESSION: 1. No pulmonary embolus. 2. Moderate bilateral pleural effusions. Consolidation with air bronchograms are identified in bilateral lower lobe in part due to compression atelectasis but superimposed pneumonia is not excluded. Electronically Signed    By: Abelardo Diesel M.D.   On: 02/23/2020 11:52   CT ABDOMEN PELVIS W CONTRAST  Result Date: 02/18/2020 CLINICAL DATA:  Left-sided abdominal pain. EXAM: CT ABDOMEN AND PELVIS WITH CONTRAST TECHNIQUE: Multidetector CT imaging of the abdomen and pelvis was performed using the standard protocol following bolus administration of intravenous contrast. CONTRAST:  141mL OMNIPAQUE IOHEXOL 300 MG/ML  SOLN COMPARISON:  06/05/2016 FINDINGS: Lower chest: Slight atelectasis at both lung bases posteriorly with slight bronchiectasis at the left lung base. Small hiatal hernia. 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 Cholecystectomy. No liver masses. Pancreas: Unremarkable. No pancreatic ductal dilatation or surrounding inflammatory changes. Spleen: Normal in size without focal abnormality. Adrenals/Urinary Tract:  Adrenal glands are unremarkable. Kidneys are normal, without renal calculi, focal lesion, or hydronephrosis. Bladder is unremarkable. Stomach/Bowel: Chronic small hiatal hernia. Stomach is otherwise normal. Small bowel appears normal including the terminal ileum. Appendix is normal. There are few diverticula in the distal descending and proximal sigmoid portions of the colon without evidence of diverticulitis. Vascular/Lymphatic: Aortic atherosclerosis. No enlarged abdominal or pelvic lymph nodes. Reproductive: Chronic 3.6 cm cyst on the right ovary, unchanged. Uterus and left ovary appear normal. Surgical clips in both adnexa consistent with previous tubal ligation. Other: There is a tiny amount of ascites in the right pericolic gutter adjacent to the inferior tip of the right lobe of the liver. No abdominal wall hernia. Musculoskeletal: No acute bone abnormality. Narrowing of the joint space of the left hip. Degenerative disc disease at L5-S1. IMPRESSION: 1. Tiny amount of ascites in the right pericolic gutter adjacent to the inferior tip of the right lobe of the liver. 2.  Diverticulosis of the distal descending and proximal sigmoid portions of the colon. 3. 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 4. Chronic small hiatal hernia. 5. Chronic 3.6 cm cyst on the right ovary, unchanged. 6. Aortic atherosclerosis. Aortic Atherosclerosis (ICD10-I70.0). Electronically Signed   By: Lorriane Shire M.D.   On: 02/18/2020 12:20   DG ERCP  Result Date: 02/19/2020 CLINICAL DATA:  64 year old female with a history of choledocholithiasis EXAM: ERCP TECHNIQUE: Multiple spot images obtained with the fluoroscopic device and submitted for interpretation post-procedure. FLUOROSCOPY TIME:  Fluoroscopy Time: 1 minutes 37 seconds COMPARISON:  MR 02/18/2020 FINDINGS: Limited images of ERCP. Initial image demonstrates endoscope projecting over the upper abdomen. There is gas within the extrahepatic biliary ductal system. Subsequently there is cannulation of the ampulla with a safety wire and partial opacification of the extrahepatic and intrahepatic biliary ductal system. Filling defects present within the ductal system with subsequently deployment of balloon retrieval catheter. IMPRESSION: Limited images during ERCP demonstrates treatment of choledocholithiasis with balloon retrieval catheter. Please refer to the dictated operative report for full details of intraoperative findings and procedure. Electronically Signed   By: Corrie Mckusick D.O.   On: 02/19/2020 17:12   MR ABDOMEN MRCP W WO CONTAST  Result Date: 02/19/2020 CLINICAL DATA:  Acute abdominal pain and jaundice. EXAM: MRI ABDOMEN WITHOUT AND WITH CONTRAST (INCLUDING MRCP) TECHNIQUE: Multiplanar multisequence MR imaging of the abdomen was performed both before and after the administration of intravenous contrast. Heavily T2-weighted images of the biliary and pancreatic ducts were obtained, and three-dimensional MRCP images were rendered by post processing. CONTRAST:  73mL GADAVIST GADOBUTROL 1 MMOL/ML IV SOLN COMPARISON:   CT scan and ultrasound same date. FINDINGS: Lower chest: Small left pleural effusion and bibasilar atelectasis. Hepatobiliary: Moderate intrahepatic biliary dilatation appears relatively stable. No worrisome hepatic lesions. Dilated common bile duct in the porta hepatis measuring up to 13 mm. Abrupt caliber change and findings consistent with common bile duct stones. The largest measures approximately 6.5 mm. The distal common bile duct near the ampulla is normal in caliber measuring 3.4 mm. Pancreas:  No mass, inflammation or ductal dilatation. Spleen:  Normal size. No focal lesions. Adrenals/Urinary Tract: The adrenal glands and kidneys are unremarkable. No worrisome renal lesions or hydronephrosis. Stomach/Bowel: Stomach, duodenum, visualized small bowel and visualized colon are unremarkable. Vascular/Lymphatic: The aorta and branch vessels are patent. The major venous structures are patent. No mesenteric or retroperitoneal mass or adenopathy. Other:  Small amount perihepatic ascites. Musculoskeletal: No significant bony findings. IMPRESSION: 1. Stable appearing chronic  intra and extrahepatic biliary dilatation. Common bile duct stones are present however and likely causing some degree of obstruction. Recommend ERCP. 2. No abdominal mass lesions or adenopathy. 3. Small amount of free abdominal fluid. 4. Small left pleural effusion and bibasilar atelectasis. These results will be called to the ordering clinician or representative by the Radiologist Assistant, and communication documented in the PACS or [employer]. Electronically Signed   By: Marijo Sanes M.D.   On: 02/19/2020 05:19   ECHOCARDIOGRAM COMPLETE  Result Date: 02/24/2020    ECHOCARDIOGRAM REPORT   Patient Name:   Jasmine Bradford Date of Exam: 02/24/2020 Medical Rec #:  [id number]      Height:       62.0 in Accession #:    [id number]     Weight:       138.2 lb Date of Birth:  1956-01-27      BSA:          1.634 m Patient Age:    59 years       BP:            117/71 mmHg Patient Gender: F              HR:           78 bpm. Exam Location:  Inpatient Procedure: 2D Echo Indications:    Pleural effusion  History:        Patient has prior history of Echocardiogram examinations, most                 recent 03/04/2016. Risk Factors:Current Smoker. Diverticulitis.  Sonographer:    Darlina Sicilian RDCS Referring Phys: [id number] [place]  1. Left ventricular ejection fraction, by estimation, is 55 to 60%. The left ventricle has normal function. The left ventricle has no regional wall motion abnormalities. Left ventricular diastolic parameters are indeterminate.  2. Right ventricular systolic function is normal. The right ventricular size is normal. Tricuspid regurgitation signal is inadequate for assessing PA pressure.  3. The mitral valve is grossly normal. Trivial mitral valve regurgitation.  4. The aortic valve is tricuspid. Aortic valve regurgitation is not visualized.  5. The inferior vena cava is normal in size with greater than 50% respiratory variability, suggesting right atrial pressure of 3 mmHg.  6. Left pleural effusion noted. FINDINGS  Left Ventricle: Left ventricular ejection fraction, by estimation, is 55 to 60%. The left ventricle has normal function. The left ventricle has no regional wall motion abnormalities. The left ventricular internal cavity size was normal in size. There is  no left ventricular hypertrophy. Left ventricular diastolic parameters are indeterminate. Right Ventricle: The right ventricular size is normal. No increase in right ventricular wall thickness. Right ventricular systolic function is normal. Tricuspid regurgitation signal is inadequate for assessing PA pressure. Left Atrium: Left atrial size was normal in size. Right Atrium: Right atrial size was normal in size. Pericardium: There is no evidence of pericardial effusion. Presence of pericardial fat pad. Mitral Valve: The mitral valve is grossly normal. Trivial mitral  valve regurgitation. Tricuspid Valve: The tricuspid valve is grossly normal. Tricuspid valve regurgitation is trivial. Aortic Valve: The aortic valve is tricuspid. Aortic valve regurgitation is not visualized. Pulmonic Valve: The pulmonic valve was grossly normal. Pulmonic valve regurgitation is trivial. Aorta: The aortic root is normal in size and structure. Venous: The inferior vena cava is normal in size with greater than 50% respiratory variability, suggesting right atrial pressure of 3 mmHg. IAS/Shunts: No atrial level  shunt detected by color flow Doppler. Additional Comments: There is pleural effusion in the left lateral region.  LEFT VENTRICLE PLAX 2D LVIDd:         4.40 cm Diastology LVIDs:         3.40 cm LV e' lateral:   8.05 cm/s LV PW:         0.70 cm LV E/e' lateral: 4.9 LV IVS:        0.70 cm LV e' medial:    5.87 cm/s                        LV E/e' medial:  6.7  LEFT ATRIUM             Index       RIGHT ATRIUM          Index LA diam:        2.00 cm 1.22 cm/m  RA Area:     8.05 cm LA Vol (A2C):   22.8 ml 13.96 ml/m RA Volume:   13.30 ml 8.14 ml/m LA Vol (A4C):   20.2 ml 12.36 ml/m LA Biplane Vol: 23.4 ml 14.32 ml/m  AORTIC VALVE LVOT Vmax:   79.10 cm/s LVOT Vmean:  60.600 cm/s LVOT VTI:    0.147 m  AORTA Ao Root diam: 2.60 cm MITRAL VALVE MV Area (PHT): 2.01 cm    SHUNTS MV Decel Time: 377 msec    Systemic VTI: 0.15 m MV E velocity: 39.20 cm/s MV A velocity: 48.00 cm/s MV E/A ratio:  0.82 Rozann Lesches MD Electronically signed by Rozann Lesches MD Signature Date/Time: 02/24/2020/3:30:13 PM    Final    US Abdomen Limited RUQ  Result Date: 02/18/2020 CLINICAL DATA:  ELEVATED LIVER ENZYMES.  CHOLECYSTECTOMY. EXAM: ULTRASOUND ABDOMEN LIMITED RIGHT UPPER QUADRANT COMPARISON:  CT SCAN DATED 02/18/2020 FINDINGS: Gallbladder: Removed. Common bile duct: Diameter: 10 mm, within normal limits for a post cholecystectomy patient. No visible mass or stone in the common duct. Liver: No focal lesion  identified. There is intrahepatic ductal dilatation as demonstrated on the prior CT scan but this is chronic. Pneumobilia is also demonstrated on this exam and on the prior CT scan. Within normal limits in parenchymal echogenicity. Portal vein is patent on color Doppler imaging with normal direction of blood flow towards the liver. Other: Small amount of ascites. The patient had a prior ERCP performed on 06/12/2018 which demonstrated the dilated bile ducts. IMPRESSION: No acute abnormality. 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 Electronically Signed   By: Lorriane Shire M.D.   On: 02/18/2020 14:04   US THORACENTESIS ASP PLEURAL SPACE W/IMG GUIDE  Result Date: 02/24/2020 INDICATION: Pleural effusion, pneumonia EXAM: ULTRASOUND GUIDED LEFT THORACENTESIS MEDICATIONS: 1% lidocaine local COMPLICATIONS: None immediate. PROCEDURE: An ultrasound guided thoracentesis was thoroughly discussed with the patient and questions answered. The benefits, risks, alternatives and complications were also discussed. The patient understands and wishes to proceed with the procedure. Written consent was obtained. Ultrasound was performed to localize and mark an adequate pocket of fluid in the left chest. The area was then prepped and draped in the normal sterile fashion. 1% Lidocaine was used for local anesthesia. Under ultrasound guidance a 6 Fr Safe-T-Centesis catheter was introduced. Thoracentesis was performed. The catheter was removed and a dressing applied. FINDINGS: A total of approximately 370 cc of serosanguineous pleural fluid was removed. Samples were sent to the laboratory as requested by the clinical team. IMPRESSION: Successful ultrasound  guided left thoracentesis yielding 370 cc of pleural fluid. Electronically Signed   By: Jerilynn Mages.  Shick M.D.   On: 02/24/2020 14:20    Micro Results     Recent Results (from the past 240 hour(s))  Blood culture (routine x 2)     Status: Abnormal   Collection  Time: 02/18/20 10:18 AM   Specimen: BLOOD  Result Value Ref Range Status   Specimen Description BLOOD RIGHT ANTECUBITAL  Final   Special Requests   Final    BOTTLES DRAWN AEROBIC AND ANAEROBIC Blood Culture adequate volume   Culture  Setup Time   Final    GRAM NEGATIVE RODS IN BOTH AEROBIC AND ANAEROBIC BOTTLES CRITICAL RESULT CALLED TO, READ BACK BY AND VERIFIED WITH: PHARMD LAURA SEAY AT [place] BY MESSAN H. ON 02/19/2020 Performed at [facility], [place] [address], [place], [place] [address]    Culture KLEBSIELLA PNEUMONIAE (A)  Final   Report Status 02/21/2020 FINAL  Final   Organism ID, Bacteria KLEBSIELLA PNEUMONIAE  Final      Susceptibility   Klebsiella pneumoniae - MIC*    AMPICILLIN >=32 RESISTANT Resistant     CEFAZOLIN <=4 SENSITIVE Sensitive     CEFEPIME <=0.12 SENSITIVE Sensitive     CEFTAZIDIME <=1 SENSITIVE Sensitive     CEFTRIAXONE <=0.25 SENSITIVE Sensitive     CIPROFLOXACIN <=0.25 SENSITIVE Sensitive     GENTAMICIN <=1 SENSITIVE Sensitive     IMIPENEM 1 SENSITIVE Sensitive     TRIMETH/SULFA <=20 SENSITIVE Sensitive     AMPICILLIN/SULBACTAM 16 INTERMEDIATE Intermediate     PIP/TAZO <=4 SENSITIVE Sensitive     * KLEBSIELLA PNEUMONIAE  Blood Culture ID Panel (Reflexed)     Status: Abnormal   Collection Time: 02/18/20 10:18 AM  Result Value Ref Range Status   Enterococcus species NOT DETECTED NOT DETECTED Final   Listeria monocytogenes NOT DETECTED NOT DETECTED Final   Staphylococcus species NOT DETECTED NOT DETECTED Final   Staphylococcus aureus (BCID) NOT DETECTED NOT DETECTED Final   Streptococcus species NOT DETECTED NOT DETECTED Final   Streptococcus agalactiae NOT DETECTED NOT DETECTED Final   Streptococcus pneumoniae NOT DETECTED NOT DETECTED Final   Streptococcus pyogenes NOT DETECTED NOT DETECTED Final   Acinetobacter baumannii NOT DETECTED NOT DETECTED Final   Enterobacteriaceae species DETECTED (A) NOT DETECTED Final    Comment: Enterobacteriaceae  represent a large family of gram-negative bacteria, not a single organism. CRITICAL RESULT CALLED TO, READ BACK BY AND VERIFIED WITH: PHARMD LAURA SEAY AT 0255 BY MESSAN H ON 02/19/2020    Enterobacter cloacae complex NOT DETECTED NOT DETECTED Final   Escherichia coli NOT DETECTED NOT DETECTED Final   Klebsiella oxytoca NOT DETECTED NOT DETECTED Final   Klebsiella pneumoniae DETECTED (A) NOT DETECTED Final    Comment: CRITICAL RESULT CALLED TO, READ BACK BY AND VERIFIED WITH: PHARMD LAURA SEAY AT 0255 BY MESSAN H ON 02/19/2020    Proteus species NOT DETECTED NOT DETECTED Final   Serratia marcescens NOT DETECTED NOT DETECTED Final   Carbapenem resistance NOT DETECTED NOT DETECTED Final   Haemophilus influenzae NOT DETECTED NOT DETECTED Final   Neisseria meningitidis NOT DETECTED NOT DETECTED Final   Pseudomonas aeruginosa NOT DETECTED NOT DETECTED Final   Candida albicans NOT DETECTED NOT DETECTED Final   Candida glabrata NOT DETECTED NOT DETECTED Final   Candida krusei NOT DETECTED NOT DETECTED Final   Candida parapsilosis NOT DETECTED NOT DETECTED Final   Candida tropicalis NOT DETECTED NOT DETECTED Final  Comment: Performed at [facility], [place] [address], [place], [place] [address]  Blood culture (routine x 2)     Status: Abnormal   Collection Time: 02/18/20 10:30 AM   Specimen: BLOOD RIGHT HAND  Result Value Ref Range Status   Specimen Description BLOOD RIGHT HAND  Final   Special Requests   Final    BOTTLES DRAWN AEROBIC AND ANAEROBIC Blood Culture adequate volume   Culture  Setup Time   Final    IN BOTH AEROBIC AND ANAEROBIC BOTTLES GRAM NEGATIVE RODS CRITICAL VALUE NOTED.  VALUE IS CONSISTENT WITH PREVIOUSLY REPORTED AND CALLED VALUE.    Culture (A)  Final    KLEBSIELLA PNEUMONIAE SUSCEPTIBILITIES PERFORMED ON PREVIOUS CULTURE WITHIN THE LAST 5 DAYS. Performed at [facility], [place] [address], [place], [place] [address]    Report Status 02/21/2020 FINAL   Final  SARS Coronavirus 2 by RT PCR (hospital order, performed in [facility] hospital lab) Nasopharyngeal Nasopharyngeal Swab     Status: None   Collection Time: 02/18/20  2:12 PM   Specimen: Nasopharyngeal Swab  Result Value Ref Range Status   SARS Coronavirus 2 NEGATIVE NEGATIVE Final    Comment: (NOTE) SARS-CoV-2 target nucleic acids are NOT DETECTED.  The SARS-CoV-2 RNA is generally detectable in upper and lower respiratory specimens during the acute phase of infection. The lowest concentration of SARS-CoV-2 viral copies this assay can detect is 250 copies / mL. A negative result does not preclude SARS-CoV-2 infection and should not be used as the sole basis for treatment or other patient management decisions.  A negative result may occur with improper specimen collection / handling, submission of specimen other than nasopharyngeal swab, presence of viral mutation(s) within the areas targeted by this assay, and inadequate number of viral copies (<250 copies / mL). A negative result must be combined with clinical observations, patient history, and epidemiological information.  Fact Sheet for Patients:   StrictlyIdeas.no  Fact Sheet for Healthcare Providers: BankingDealers.co.za  This test is not yet approved or  cleared by the [place] FDA and has been authorized for detection and/or diagnosis of SARS-CoV-2 by FDA under an Emergency Use Authorization (EUA).  This EUA will remain in effect (meaning this test can be used) for the duration of the COVID-19 declaration under Section 564(b)(1) of the Act, 21 U.S.C. section 360bbb-3(b)(1), unless the authorization is terminated or revoked sooner.  Performed at [facility], [place] [address] [place], [place] [address]   Urine culture     Status: None   Collection Time: 02/18/20  2:30 PM   Specimen: Urine, Random  Result Value Ref Range Status   Specimen Description URINE,  RANDOM  Final   Special Requests NONE  Final   Culture   Final    NO GROWTH Performed at [facility], [place] [address] [place], [place] [address]    Report Status 02/19/2020 FINAL  Final  Blood culture (routine x 2)     Status: None (Preliminary result)   Collection Time: 02/23/20 12:35 PM   Specimen: BLOOD RIGHT HAND  Result Value Ref Range Status   Specimen Description BLOOD RIGHT HAND  Final   Special Requests   Final    BOTTLES DRAWN AEROBIC AND ANAEROBIC Blood Culture results may not be optimal due to an inadequate volume of blood received in culture bottles   Culture   Final    NO GROWTH 1 DAY Performed at [facility],  [address] [place], [place] [address]    Report Status PENDING  Incomplete  Blood culture (routine x 2)     Status: None (Preliminary result)   Collection Time: 02/23/20 12:43 PM   Specimen: BLOOD  Result Value Ref Range Status   Specimen Description BLOOD LEFT ANTECUBITAL  Final   Special Requests   Final    BOTTLES DRAWN AEROBIC AND ANAEROBIC Blood Culture results may not be optimal due to an excessive volume of blood received in culture bottles   Culture   Final    NO GROWTH 1 DAY Performed at [facility], [place] [address] [place], [place] [address]    Report Status PENDING  Incomplete  SARS Coronavirus 2 by RT PCR (hospital order, performed in [facility] hospital lab) Nasopharyngeal Nasopharyngeal Swab     Status: None   Collection Time: 02/23/20  2:20 PM   Specimen: Nasopharyngeal Swab  Result Value Ref Range Status   SARS Coronavirus 2 NEGATIVE NEGATIVE Final    Comment: (NOTE) SARS-CoV-2 target nucleic acids are NOT DETECTED.  The SARS-CoV-2 RNA is generally detectable in upper and lower respiratory specimens during the acute phase of infection. The lowest concentration of SARS-CoV-2 viral copies this assay can detect is 250 copies / mL. A negative result does not preclude SARS-CoV-2 infection and should not be used as  the sole basis for treatment or other patient management decisions.  A negative result may occur with improper specimen collection / handling, submission of specimen other than nasopharyngeal swab, presence of viral mutation(s) within the areas targeted by this assay, and inadequate number of viral copies (<250 copies / mL). A negative result must be combined with clinical observations, patient history, and epidemiological information.  Fact Sheet for Patients:   StrictlyIdeas.no  Fact Sheet for Healthcare Providers: BankingDealers.co.za  This test is not yet approved or  cleared by the [place] FDA and has been authorized for detection and/or diagnosis of SARS-CoV-2 by FDA under an Emergency Use Authorization (EUA).  This EUA will remain in effect (meaning this test can be used) for the duration of the COVID-19 declaration under Section 564(b)(1) of the Act, 21 U.S.C. section 360bbb-3(b)(1), unless the authorization is terminated or revoked sooner.  Performed at [facility], [place] [address] [place], [place] [address]   Gram stain     Status: None   Collection Time: 02/24/20  2:22 PM   Specimen: Lung, Left; Pleural Fluid  Result Value Ref Range Status   Specimen Description PLEURAL FLUID  Final   Special Requests LEFT LUNG  Final   Gram Stain   Final    WBC PRESENT,BOTH PMN AND MONONUCLEAR NO ORGANISMS SEEN CYTOSPIN SMEAR Performed at [facility], [address] [place], [place] [address]    Report Status 02/24/2020 FINAL  Final       Today   Subjective:   Jasmine Bradford today has no headache,no chest or  abdominal pain, denies any dyspnea, reports her appetite has improved, she did report some pain at left lower lung yesterday post procedure, but this is all has resolved currently .  Objective:   Blood pressure 100/65, pulse 74, temperature 98.3 F (36.8 C), resp. rate 18, height 5\' 2"  (1.575 m),  weight 62.7 kg, SpO2 93 %.   Intake/Output Summary (Last 24 hours) at 02/25/2020 1153 Last data filed at 02/25/2020 0956 Gross per 24 hour  Intake 240 ml  Output --  Net 240 ml  Exam Awake Alert, Oriented x 3, No new F.N deficits, Normal affect  Symmetrical Chest wall movement, improved air entry bilaterally, some Rales at left lung base. RRR,No Gallops,Rubs or new Murmurs, No Parasternal Heave +ve B.Sounds, Abd Soft, Non tender, No organomegaly appriciated, No rebound -guarding or rigidity. No Cyanosis, Clubbing or edema, No new Rash or bruise  Data Review   CBC w Diff:  Lab Results  Component Value Date   WBC 8.3 02/25/2020   HGB 12.4 02/25/2020   HCT 38.3 02/25/2020   PLT 267 02/25/2020   LYMPHOPCT 25 02/23/2020   MONOPCT 6 02/23/2020   EOSPCT 1 02/23/2020   BASOPCT 1 02/23/2020    CMP:  Lab Results  Component Value Date   NA 138 02/25/2020   K 3.7 02/25/2020   CL 102 02/25/2020   CO2 28 02/25/2020   BUN 7 (L) 02/25/2020   CREATININE 0.87 02/25/2020   PROT 6.7 02/24/2020   ALBUMIN 2.4 (L) 02/24/2020   BILITOT 1.8 (H) 02/23/2020   ALKPHOS 107 02/23/2020   AST 33 02/23/2020   ALT 165 (H) 02/23/2020  .   Total Time in preparing paper work, data evaluation and todays exam - 58 minutes  Phillips Climes M.D on 02/25/2020 at 11:53 AM  [facility]   Office  [phone]

## 2020-02-26 ENCOUNTER — Telehealth: Payer: Self-pay

## 2020-02-26 LAB — PATHOLOGIST SMEAR REVIEW

## 2020-02-26 NOTE — Telephone Encounter (Signed)
Transition Care Management Follow-up Telephone Call  Date of discharge and from where: 02/25/2020-[facility]  How have you been since you were released from the hospital? Better today  Any questions or concerns? No   Items Reviewed:  Did the pt receive and understand the discharge instructions provided? Yes   Medications obtained and verified? Yes   Any new allergies since your discharge? No   Dietary orders reviewed? Yes  Do you have support at home? Yes   Functional Questionnaire: (I = Independent and D = Dependent) ADLs: I  Bathing/Dressing- I  Meal Prep- I  Eating- I  Maintaining continence- I  Transferring/Ambulation- I  Managing Meds- I  Follow up appointments reviewed:   PCP Hospital f/u appt confirmed? Yes  Scheduled to see Dr. Bryan Lemma on 03/04/20 @ [facility] f/u appt confirmed? Yes  Scheduled to see Dr. Gerrit Heck on 04/17/20 @ 3pm.  Are transportation arrangements needed? No   If their condition worsens, is the pt aware to call PCP or go to the Emergency Dept.? Yes  Was the patient provided with contact information for the PCP's office or ED? Yes  Was to pt encouraged to call back with questions or concerns? Yes

## 2020-02-27 LAB — PH, BODY FLUID: pH, Body Fluid: 7.5

## 2020-02-28 LAB — CULTURE, BLOOD (ROUTINE X 2)
Culture: NO GROWTH
Culture: NO GROWTH

## 2020-02-29 LAB — CULTURE, BODY FLUID W GRAM STAIN -BOTTLE: Culture: NO GROWTH

## 2020-03-03 ENCOUNTER — Other Ambulatory Visit: Payer: Self-pay

## 2020-03-04 ENCOUNTER — Encounter: Payer: Self-pay | Admitting: Family Medicine

## 2020-03-04 ENCOUNTER — Ambulatory Visit (INDEPENDENT_AMBULATORY_CARE_PROVIDER_SITE_OTHER): Payer: Medicare HMO | Admitting: Family Medicine

## 2020-03-04 ENCOUNTER — Ambulatory Visit: Payer: Medicare HMO

## 2020-03-04 VITALS — BP 100/70 | HR 93 | Temp 97.2°F | Ht 62.0 in | Wt 124.4 lb

## 2020-03-04 DIAGNOSIS — J189 Pneumonia, unspecified organism: Secondary | ICD-10-CM | POA: Diagnosis not present

## 2020-03-04 DIAGNOSIS — R748 Abnormal levels of other serum enzymes: Secondary | ICD-10-CM | POA: Diagnosis not present

## 2020-03-04 DIAGNOSIS — J90 Pleural effusion, not elsewhere classified: Secondary | ICD-10-CM

## 2020-03-04 DIAGNOSIS — N179 Acute kidney failure, unspecified: Secondary | ICD-10-CM

## 2020-03-04 DIAGNOSIS — R1013 Epigastric pain: Secondary | ICD-10-CM

## 2020-03-04 MED ORDER — OMEPRAZOLE 40 MG PO CPDR: 40.0000 mg | DELAYED_RELEASE_CAPSULE | Freq: Every day | ORAL | 1 refills | Status: DC

## 2020-03-04 NOTE — Progress Notes (Signed)
Jasmine Bradford is a 64 y.o. female  Chief Complaint  Patient presents with  . Hospitalization Follow-up    Pt has a list of labs to be performed today, CBC, CMP and Chest Xray.    HPI: Jasmine Bradford is a 64 y.o. female seen today for hospital f/u.   She was admitted with acute hepatitis, choledocholithiasis on 02/18/20. She had ERCP on 02/19/20. She was discharged on 02/21/20. She was readmitted on 02/23/20 with HCAP, pleural effusion. She had a thoracentesis on 02/24/20. She was discharged on 02/25/20.  Hospital notes, labs, results reviewed by myself today. Pleural effusion cultures were negative. She is due for labs today and CXR in 3-4 wks. She has minimal, intermittent chest pain on Rt side. She can lay comfortably in bed. She is able to take a deep breath.  She needs a refill of her omeprazole today.   Past Medical History:  Diagnosis Date  . Cholelithiasis with choledocholithiasis   . Diverticulitis   . Tobacco abuse   . Varicose vein of leg    no problems now  . Wears glasses     Past Surgical History:  Procedure Laterality Date  . BILIARY DILATION  02/19/2020   Procedure: BILIARY DILATION;  Surgeon: Jackquline Denmark, MD;  Location: [facility] ENDOSCOPY;  Service: Endoscopy;;  . CESAREAN SECTION    . CHOLECYSTECTOMY N/A 05/23/2016   Procedure: LAPAROSCOPIC CHOLECYSTECTOMY WITH INTRAOPERATIVE CHOLANGIOGRAM;  Surgeon: Jackolyn Confer, MD;  Location: [facility];  Service: General;  Laterality: N/A;  . ENDOSCOPIC RETROGRADE CHOLANGIOPANCREATOGRAPHY (ERCP) WITH PROPOFOL N/A 06/06/2016   Procedure: ENDOSCOPIC RETROGRADE CHOLANGIOPANCREATOGRAPHY (ERCP) WITH PROPOFOL;  Surgeon: Clarene Essex, MD;  Location: [facility] ENDOSCOPY;  Service: Endoscopy;  Laterality: N/A;  . ENDOSCOPIC RETROGRADE CHOLANGIOPANCREATOGRAPHY (ERCP) WITH PROPOFOL N/A 02/19/2020   Procedure: ENDOSCOPIC RETROGRADE CHOLANGIOPANCREATOGRAPHY (ERCP) WITH PROPOFOL;  Surgeon: Jackquline Denmark, MD;  Location: [facility] ENDOSCOPY;  Service:  Endoscopy;  Laterality: N/A;  . ERCP N/A 03/09/2016   Procedure: ENDOSCOPIC RETROGRADE CHOLANGIOPANCREATOGRAPHY (ERCP);  Surgeon: Clarene Essex, MD;  Location: [facility] ENDOSCOPY;  Service: Endoscopy;  Laterality: N/A;  . ERCP N/A 06/12/2018   Procedure: ENDOSCOPIC RETROGRADE CHOLANGIOPANCREATOGRAPHY (ERCP);  Surgeon: Clarene Essex, MD;  Location: [facility] ENDOSCOPY;  Service: Endoscopy;  Laterality: N/A;  . ESOPHAGOGASTRODUODENOSCOPY N/A 06/12/2018   Procedure: ESOPHAGOGASTRODUODENOSCOPY (EGD);  Surgeon: Clarene Essex, MD;  Location: [facility] ENDOSCOPY;  Service: Endoscopy;  Laterality: N/A;  . laser vein surgery     last year  . REMOVAL OF STONES  06/12/2018   Procedure: REMOVAL OF STONES;  Surgeon: Clarene Essex, MD;  Location: [facility] ENDOSCOPY;  Service: Endoscopy;;  . REMOVAL OF STONES  02/19/2020   Procedure: REMOVAL OF STONES;  Surgeon: Jackquline Denmark, MD;  Location: [facility] ENDOSCOPY;  Service: Endoscopy;;  . Joan Mayans  06/12/2018   Procedure: Joan Mayans;  Surgeon: Clarene Essex, MD;  Location: [facility] ENDOSCOPY;  Service: Endoscopy;;  . Joan Mayans  02/19/2020   Procedure: SPHINCTEROTOMY;  Surgeon: Jackquline Denmark, MD;  Location: [facility];  Service: Endoscopy;;  . TUBAL LIGATION    . VARICOSE VEIN SURGERY Left     Social History   Socioeconomic History  . Marital status: Married    Spouse name: Not on file  . Number of children: Not on file  . Years of education: Not on file  . Highest education level: Not on file  Occupational History  . Not on file  Tobacco Use  . Smoking status: Former Smoker    Packs/day: 0.50    Years: 35.00  Pack years: 17.50    Types: Cigarettes  . Smokeless tobacco: Never Used  . Tobacco comment: No smoing since 02/2016- "wearing Nicotine patch"  Vaping Use  . Vaping Use: Never used  Substance and Sexual Activity  . Alcohol use: No    Alcohol/week: 0.0 standard drinks  . Drug use: No  . Sexual activity: Not on file  Other Topics Concern  . Not on file  Social History  Narrative  . Not on file   Social Determinants of Health   Financial Resource Strain:   . Difficulty of Paying Living Expenses:   Food Insecurity:   . Worried About Charity fundraiser in the Last Year:   . Arboriculturist in the Last Year:   Transportation Needs:   . Film/video editor (Medical):   Marland Kitchen Lack of Transportation (Non-Medical):   Physical Activity:   . Days of Exercise per Week:   . Minutes of Exercise per Session:   Stress:   . Feeling of Stress :   Social Connections:   . Frequency of Communication with Friends and Family:   . Frequency of Social Gatherings with Friends and Family:   . Attends Religious Services:   . Active Member of Clubs or Organizations:   . Attends Archivist Meetings:   Marland Kitchen Marital Status:   Intimate Partner Violence:   . Fear of Current or Ex-Partner:   . Emotionally Abused:   Marland Kitchen Physically Abused:   . Sexually Abused:     Family History  Problem Relation Age of Onset  . Memory loss Mother   . Ovarian cancer Mother   . Colon polyps Neg Hx   . Colon cancer Neg Hx      Immunization History  Administered Date(s) Administered  . Influenza, Quadrivalent, Recombinant, Inj, Pf 06/09/2019  . Influenza,inj,Quad PF,6+ Mos 06/07/2016  . PFIZER SARS-COV-2 Vaccination 10/13/2019, 11/04/2019  . Pneumococcal Conjugate-13 06/09/2019  . Tdap 02/10/2015    Outpatient Encounter Medications as of 03/04/2020  Medication Sig  . acetaminophen (TYLENOL) 500 MG tablet Take 500 mg by mouth every 6 (six) hours as needed.  . Carboxymethylcellulose Sodium (THERATEARS) 0.25 % SOLN Place 1 drop into both eyes daily.  . cetirizine (ZYRTEC) 10 MG tablet Take 10 mg by mouth daily as needed for allergies.  . Multiple Vitamin (MULTIVITAMIN WITH MINERALS) TABS tablet Take 1 tablet by mouth daily with lunch.  Marland Kitchen omeprazole (PRILOSEC) 40 MG capsule Take 1 capsule (40 mg total) by mouth daily.  Marland Kitchen ibuprofen (ADVIL) 200 MG tablet Take 200 mg by mouth every 6  (six) hours as needed for headache or moderate pain. (Patient not taking: Reported on 03/04/2020)  . ondansetron (ZOFRAN) 4 MG tablet Take 1 tablet (4 mg total) by mouth every 6 (six) hours as needed for nausea. (Patient not taking: Reported on 03/04/2020)  . oxyCODONE (OXY IR/ROXICODONE) 5 MG immediate release tablet Take 1 tablet (5 mg total) by mouth every 6 (six) hours as needed for severe pain.   No facility-administered encounter medications on file as of 03/04/2020.     ROS: Pertinent positives and negatives noted in HPI. Remainder of ROS non-contributory    Allergies  Allergen Reactions  . Oxycodone Nausea And Vomiting and Other (See Comments)    Also made patient lightheaded and lethargic  . Potassium Itching    BP 100/70 (BP Location: Left Arm, Patient Position: Sitting, Cuff Size: Normal)   Pulse 93   Temp (!) 97.2 F (36.2  C) (Temporal)   Ht 5\' 2"  (1.575 m)   Wt 124 lb 6.4 oz (56.4 kg)   SpO2 96%   BMI 22.75 kg/m    BP Readings from Last 3 Encounters:  03/04/20 100/70  02/25/20 100/65  02/21/20 91/65   Pulse Readings from Last 3 Encounters:  03/04/20 93  02/25/20 74  02/21/20 90   Wt Readings from Last 3 Encounters:  03/04/20 124 lb 6.4 oz (56.4 kg)  02/23/20 138 lb 3.2 oz (62.7 kg)  02/20/20 138 lb 3.2 oz (62.7 kg)    Physical Exam Constitutional:      General: She is not in acute distress.    Appearance: Normal appearance. She is not ill-appearing.  Cardiovascular:     Rate and Rhythm: Normal rate and regular rhythm.     Pulses: Normal pulses.  Pulmonary:     Effort: Pulmonary effort is normal. No respiratory distress.     Breath sounds: Normal breath sounds. No wheezing or rhonchi.  Abdominal:     General: Abdomen is flat. Bowel sounds are normal.  Skin:    General: Skin is warm and dry.     Coloration: Skin is not jaundiced.  Neurological:     General: No focal deficit present.     Mental Status: She is alert and oriented to person, place,  and time.  Psychiatric:        Mood and Affect: Mood normal.        Behavior: Behavior normal.      A/P:  1. Pleural effusion 2. HCAP (healthcare-associated pneumonia) - DG Chest 2 View; Future - CBC - symptoms improving - pt is back to work and is on light type duty  3. AKI (acute kidney injury) ([place]) - Comprehensive metabolic panel  4. Elevated liver enzymes - Comprehensive metabolic panel  5. Epigastric abdominal pain - much-improved Refill: - omeprazole (PRILOSEC) 40 MG capsule; Take 1 capsule (40 mg total) by mouth daily.  Dispense: 90 capsule; Refill: 1    This visit occurred during the SARS-CoV-2 public health emergency.  Safety protocols were in place, including screening questions prior to the visit, additional usage of staff PPE, and extensive cleaning of exam room while observing appropriate contact time as indicated for disinfecting solutions.

## 2020-03-05 LAB — COMPREHENSIVE METABOLIC PANEL
ALT: 30 U/L (ref 0–35)
AST: 24 U/L (ref 0–37)
Albumin: 3.9 g/dL (ref 3.5–5.2)
Alkaline Phosphatase: 84 U/L (ref 39–117)
BUN: 18 mg/dL (ref 6–23)
CO2: 28 mEq/L (ref 19–32)
Calcium: 9.2 mg/dL (ref 8.4–10.5)
Chloride: 101 mEq/L (ref 96–112)
Creatinine, Ser: 0.83 mg/dL (ref 0.40–1.20)
GFR: 68.8 mL/min (ref >60.00)
Glucose, Bld: 111 mg/dL — ABNORMAL HIGH (ref 70–99)
Potassium: 4 mEq/L (ref 3.5–5.1)
Sodium: 138 mEq/L (ref 135–145)
Total Bilirubin: 0.5 mg/dL (ref 0.2–1.2)
Total Protein: 7 g/dL (ref 6.0–8.3)

## 2020-03-05 LAB — CBC
HCT: 34.7 % — ABNORMAL LOW (ref 36.0–46.0)
Hemoglobin: 11.7 g/dL — ABNORMAL LOW (ref 12.0–15.0)
MCHC: 33.8 g/dL (ref 30.0–36.0)
MCV: 84 fl (ref 78.0–100.0)
Platelets: 370 10*3/uL (ref 150.0–400.0)
RBC: 4.12 Mil/uL (ref 3.87–5.11)
RDW: 13.1 % (ref 11.5–15.5)
WBC: 6.7 10*3/uL (ref 4.0–10.5)

## 2020-03-06 ENCOUNTER — Encounter: Payer: Self-pay | Admitting: Family Medicine

## 2020-03-18 ENCOUNTER — Other Ambulatory Visit: Payer: Medicare HMO

## 2020-03-18 ENCOUNTER — Other Ambulatory Visit: Payer: Self-pay

## 2020-03-18 ENCOUNTER — Ambulatory Visit (INDEPENDENT_AMBULATORY_CARE_PROVIDER_SITE_OTHER): Payer: Medicare HMO

## 2020-03-18 DIAGNOSIS — J189 Pneumonia, unspecified organism: Secondary | ICD-10-CM

## 2020-03-18 DIAGNOSIS — J90 Pleural effusion, not elsewhere classified: Secondary | ICD-10-CM | POA: Diagnosis not present

## 2020-03-18 NOTE — Addendum Note (Signed)
Addended by: Lynnea Ferrier on: 03/18/2020 03:44 PM   Modules accepted: Orders

## 2020-03-24 ENCOUNTER — Ambulatory Visit: Payer: Medicare HMO | Admitting: Gastroenterology

## 2020-04-17 ENCOUNTER — Encounter: Payer: Self-pay | Admitting: Gastroenterology

## 2020-04-17 ENCOUNTER — Other Ambulatory Visit: Payer: Self-pay | Admitting: Family Medicine

## 2020-04-17 ENCOUNTER — Ambulatory Visit: Payer: Medicare HMO | Admitting: Gastroenterology

## 2020-04-17 VITALS — BP 110/60 | HR 88 | Ht 62.0 in | Wt 125.0 lb

## 2020-04-17 DIAGNOSIS — R0989 Other specified symptoms and signs involving the circulatory and respiratory systems: Secondary | ICD-10-CM | POA: Diagnosis not present

## 2020-04-17 DIAGNOSIS — K219 Gastro-esophageal reflux disease without esophagitis: Secondary | ICD-10-CM | POA: Diagnosis not present

## 2020-04-17 DIAGNOSIS — R110 Nausea: Secondary | ICD-10-CM

## 2020-04-17 DIAGNOSIS — K8050 Calculus of bile duct without cholangitis or cholecystitis without obstruction: Secondary | ICD-10-CM

## 2020-04-17 DIAGNOSIS — Z86010 Personal history of colonic polyps: Secondary | ICD-10-CM

## 2020-04-17 MED ORDER — URSODIOL 300 MG PO CAPS: 300.0000 mg | ORAL_CAPSULE | Freq: Two times a day (BID) | ORAL | 3 refills | Status: DC

## 2020-04-17 MED ORDER — FAMOTIDINE 20 MG PO TABS
20.0000 mg | ORAL_TABLET | Freq: Every day | ORAL | 3 refills | Status: DC
Start: 2020-04-17 — End: 2020-08-11

## 2020-04-17 NOTE — Telephone Encounter (Signed)
Per pharmacy, medication Ursodiol is not covered by patient's insurance. An alternative medication is being requested. Forwarding request to PCP for review.

## 2020-04-17 NOTE — Patient Instructions (Signed)
If you are age 67 or older, your body mass index should be between 23-30. Your Body mass index is 22.86 kg/m. If this is out of the aforementioned range listed, please consider follow up with your Primary Care Provider.  If you are age 30 or younger, your body mass index should be between 19-25. Your Body mass index is 22.86 kg/m. If this is out of the aformentioned range listed, please consider follow up with your Primary Care Provider.   We have sent the following medications to your pharmacy for you to pick up at your convenience: Ursodiol 600 mg daily Pepcid 20 mg at bedtime  Start Probiotic - this is over the counter.  Follow up in three to six months. Please call the office for an appointment as the schedule is not available at this time.  It was a pleasure to see you today!  Vito Cirigliano, D.O.

## 2020-04-17 NOTE — Telephone Encounter (Signed)
Rx'd by Dr. Gerrit Heck (GI) so will route message to him

## 2020-04-17 NOTE — Progress Notes (Signed)
P  Chief Complaint:    Procedure follow-up, diarrhea  GI History: 23 old female follows in the GI clinic for the following:  1) GERD: Index symptoms of dyspepsia, globus sensation.  No heartburn or regurgitation.  No dysphagia.  Had been well-controlled with Prilosec 40 mg/day. Possible bile acid reflux overlap.  2) Choledocholithiasis: History of cholelithiasis s/p ccy with IOC in 05/2016 with subsequent ERCP in 05/2018.  Readmitted 01/2020 with choledocholithiasis, underwent repeat ERCP with stone extraction.  3) History of colon polyps: Due for repeat colonoscopy in 2023 for ongoing polyp surveillance  Endoscopic history: -ERCP (02/2016, Dr. Watt Climes, [place]): Choledocholithiasis, removed with sphincterotomy and balloon sweeps -ERCP (05/2016, Dr. Watt Climes): Choledocholithiasis, removed with further sphincterotomy and balloon sweeps with removal of stones and sludge -EGD (05/2018, Dr. Watt Climes): Normal -ERCP (05/2018, Dr. Watt Climes): No stones on cholangiogram.  Balloon sweeps (nothing removed) and dilation of distal CBD/ampulla with 4 cm x 8 mm balloon dilator to facilitate improved drainage -Colonoscopy (09/2018, Dr. Watt Climes): External/internal hemorrhoids, sigmoid diverticulosis, 7 polyps resected (no path review), normal TI.  Recommended repeat in 3 years -ERCP (01/2020, Dr. Lyndel Safe): Removal of multiple stones and pus.  Normal post occlusion cholangiogram  -Abdominal ultrasound (05/2018): Surgically absent GB, CBD 10.1 mm tapering to 7.1 mm.  Increased hepatic echotexture   HPI:     Patient is a 64 y.o. female presenting to the Gastroenterology Clinic for follow-up.  Admitted 02/18/2020 with choledocholithiasis and Kleb bacteremia, s/p ERCP 6/30 with stone extraction, and discharged 7/2.  Readmitted 02/23/2020 with HCAP, pleural effusion, treated with antibiotics and thoracentesis on 7/5, and discharge 02/25/2020.  Follow-up with her PCM, Dr. Letta Median, on 03/04/2020.  Liver enzymes normalized.    Today, she states no recurrence of index sxs. Appetite improved.  She is very concerned about recurrence of choledocholithiasis in the future.  Has had recurrence of intermittent occasional globus sensation, particularly in the AM. Approx 2 days/week and described as bothersome. No HB, regurgitation. No dysphagia. +nausea without emesis intermittently with these episodes. Still taking Prilosec 40 mg/day.   Episodes of diarrhea earlier in the week, but o/w had been at baseline over last several weeks.   Review of systems:     No chest pain, no SOB, no fevers, no urinary sx   Past Medical History:  Diagnosis Date  . Cholelithiasis with choledocholithiasis   . Diverticulitis   . Tobacco abuse   . Varicose vein of leg    no problems now  . Wears glasses     Patient's surgical history, family medical history, social history, medications and allergies were all reviewed in Epic    Current Outpatient Medications  Medication Sig Dispense Refill  . acetaminophen (TYLENOL) 500 MG tablet Take 500 mg by mouth every 6 (six) hours as needed.    . Carboxymethylcellulose Sodium (THERATEARS) 0.25 % SOLN Place 1 drop into both eyes daily.    . cetirizine (ZYRTEC) 10 MG tablet Take 10 mg by mouth daily as needed for allergies.    Marland Kitchen ibuprofen (ADVIL) 200 MG tablet Take 200 mg by mouth every 6 (six) hours as needed for headache or moderate pain.     . Multiple Vitamin (MULTIVITAMIN WITH MINERALS) TABS tablet Take 1 tablet by mouth daily with lunch.    Marland Kitchen omeprazole (PRILOSEC) 40 MG capsule Take 1 capsule (40 mg total) by mouth daily. 90 capsule 1   No current facility-administered medications for this visit.    Physical Exam:     BP  110/60   Pulse 88   Ht 5\' 2"  (1.575 m)   Wt 125 lb (56.7 kg)   BMI 22.86 kg/m   GENERAL:  Pleasant female in NAD PSYCH: : Cooperative, normal affect Musculoskeletal:  Normal muscle tone, normal strength NEURO: Alert and oriented x 3, no focal neurologic  deficits   IMPRESSION and PLAN:    1) Choledocholithiasis -Unfortunately, she has had recurrent choledocholithiasis despite cholecystectomy back in 2017, with most recent course complicated by bacteremia/cholangitis, treated with ERCP with stone extraction and antibiotics. -Trial course of ursodiol for stone dissolution/choledocholithiasis prevention -Liver enzymes have since normalized  2) GERD 3) Globus sensation 4) Nausea without emesis -Continue Prilosec 40 mg/day and add Pepcid 20 mg qhs -Possibility that she has overlapping bile acid reflux, but not interested in antireflux surgical options at this juncture, which is very understandable given recent complicated hospital courses  5) Diarrhea -Isolated episode of diarrhea earlier this week, with bowel habits back to baseline -Reasonable to trial 2-3-week course of probiotics given recent multiple antibiotic exposures  6) Colon polyps -Repeat colonoscopy in 2023 for ongoing polyp surveillance  RTC in 3-6 months or sooner as needed  I spent 35 minutes of time, including in depth chart review, independent review of results as outlined above, communicating results with the patient directly, face-to-face time with the patient, coordinating care, and ordering studies and medications as appropriate, and documentation.           Lavena Bullion ,DO, FACG 04/17/2020, 3:45 PM

## 2020-04-20 NOTE — Telephone Encounter (Signed)
Please review her insurance for which form of ursodeoxycholic acid is covered and resubmit Rx? Thanks.

## 2020-04-23 NOTE — Telephone Encounter (Signed)
Closing encounter. Medication has been prescribed by Dr. Lucille Passy.

## 2020-04-24 ENCOUNTER — Telehealth: Payer: Self-pay | Admitting: Gastroenterology

## 2020-04-24 NOTE — Telephone Encounter (Signed)
Patient called needs prior auth for insurance to get the Ursodiol medication

## 2020-04-29 NOTE — Telephone Encounter (Signed)
Yes, the 500 mg per day dose is perfectly acceptable. Plan to take for 12 months, then reassess need for ongoing therapy yearly. Thank you.

## 2020-04-29 NOTE — Telephone Encounter (Signed)
Spoke with patient.  She called insurance to see if there is an alternative medication.  They told her they would cover Ursodiol 250 mg twice daily but not 300 mg bid or 600 mg qd.  Also, alternative med Chenodal 250 mg.  Please advise if okay to take  Ursodiol 250 mg bid or 500 qd.  Thanks, Peter Congo

## 2020-04-30 ENCOUNTER — Other Ambulatory Visit: Payer: Self-pay

## 2020-04-30 MED ORDER — URSODIOL 250 MG PO TABS: 250.0000 mg | ORAL_TABLET | Freq: Two times a day (BID) | ORAL | 11 refills | Status: DC

## 2020-04-30 NOTE — Telephone Encounter (Signed)
Ursodiol 250 mg sent to pharmacy.  Patient aware.  To follow up in one year.

## 2020-05-05 ENCOUNTER — Telehealth: Payer: Self-pay | Admitting: Family Medicine

## 2020-05-05 NOTE — Telephone Encounter (Signed)
Spoke with patient spouse he stated to call her back tomorrow after 3:30pm

## 2020-05-08 ENCOUNTER — Telehealth: Payer: Self-pay | Admitting: Family Medicine

## 2020-05-08 NOTE — Telephone Encounter (Signed)
Left message for patient to schedule Annual Wellness Visit.  Please schedule with Nurse Health Advisor Martha Stanley, RN at [facility]  °

## 2020-07-24 ENCOUNTER — Ambulatory Visit
Admission: RE | Admit: 2020-07-24 | Discharge: 2020-07-24 | Disposition: A | Discharge status: Home / Self Care | Payer: Medicare HMO | Source: Ambulatory Visit | Attending: Family Medicine | Admitting: Family Medicine

## 2020-07-24 ENCOUNTER — Other Ambulatory Visit: Payer: Self-pay | Admitting: Family Medicine

## 2020-07-24 ENCOUNTER — Other Ambulatory Visit: Payer: Self-pay

## 2020-07-24 DIAGNOSIS — R928 Other abnormal and inconclusive findings on diagnostic imaging of breast: Secondary | ICD-10-CM

## 2020-07-24 DIAGNOSIS — R922 Inconclusive mammogram: Secondary | ICD-10-CM | POA: Diagnosis not present

## 2020-07-24 DIAGNOSIS — N6322 Unspecified lump in the left breast, upper inner quadrant: Secondary | ICD-10-CM | POA: Diagnosis not present

## 2020-07-24 DIAGNOSIS — N6321 Unspecified lump in the left breast, upper outer quadrant: Secondary | ICD-10-CM | POA: Diagnosis not present

## 2020-08-11 ENCOUNTER — Other Ambulatory Visit: Payer: Self-pay | Admitting: General Surgery

## 2020-08-11 ENCOUNTER — Telehealth: Payer: Self-pay | Admitting: Gastroenterology

## 2020-08-11 ENCOUNTER — Other Ambulatory Visit: Payer: Self-pay | Admitting: Gastroenterology

## 2020-08-11 MED ORDER — FAMOTIDINE 20 MG PO TABS
20.0000 mg | ORAL_TABLET | Freq: Every day | ORAL | 3 refills | Status: DC
Start: 2020-08-11 — End: 2020-11-09

## 2020-09-17 ENCOUNTER — Telehealth: Payer: Self-pay | Admitting: Family Medicine

## 2020-09-17 NOTE — Telephone Encounter (Signed)
Left message for patient to schedule Annual Wellness Visit.  Please schedule with Nurse Health Advisor Martha Stanley, RN at [facility]  °

## 2020-11-01 ENCOUNTER — Other Ambulatory Visit: Payer: Self-pay | Admitting: Family Medicine

## 2020-11-01 DIAGNOSIS — R1013 Epigastric pain: Secondary | ICD-10-CM

## 2020-11-02 ENCOUNTER — Other Ambulatory Visit: Payer: Self-pay | Admitting: Gastroenterology

## 2020-11-06 ENCOUNTER — Ambulatory Visit
Admission: RE | Admit: 2020-11-06 | Discharge: 2020-11-06 | Disposition: A | Discharge status: Home / Self Care | Payer: Medicare HMO | Source: Ambulatory Visit | Attending: Family Medicine | Admitting: Family Medicine

## 2020-11-06 ENCOUNTER — Other Ambulatory Visit: Payer: Self-pay

## 2020-11-06 DIAGNOSIS — R928 Other abnormal and inconclusive findings on diagnostic imaging of breast: Secondary | ICD-10-CM

## 2020-11-06 DIAGNOSIS — R922 Inconclusive mammogram: Secondary | ICD-10-CM | POA: Diagnosis not present

## 2020-11-06 DIAGNOSIS — N6322 Unspecified lump in the left breast, upper inner quadrant: Secondary | ICD-10-CM | POA: Diagnosis not present

## 2020-11-06 DIAGNOSIS — N6321 Unspecified lump in the left breast, upper outer quadrant: Secondary | ICD-10-CM | POA: Diagnosis not present

## 2020-11-12 ENCOUNTER — Other Ambulatory Visit: Payer: Self-pay | Admitting: General Surgery

## 2020-11-12 DIAGNOSIS — R1013 Epigastric pain: Secondary | ICD-10-CM

## 2020-11-12 MED ORDER — OMEPRAZOLE 40 MG PO CPDR: DELAYED_RELEASE_CAPSULE | ORAL | 0 refills | Status: DC

## 2020-11-18 ENCOUNTER — Other Ambulatory Visit (HOSPITAL_COMMUNITY)
Admission: RE | Admit: 2020-11-18 | Discharge: 2020-11-18 | Disposition: A | Discharge status: Home / Self Care | Payer: Medicare HMO | Source: Ambulatory Visit | Attending: Family Medicine | Admitting: Family Medicine

## 2020-11-18 ENCOUNTER — Other Ambulatory Visit: Payer: Self-pay

## 2020-11-18 ENCOUNTER — Ambulatory Visit (INDEPENDENT_AMBULATORY_CARE_PROVIDER_SITE_OTHER): Payer: Medicare HMO | Admitting: Family Medicine

## 2020-11-18 ENCOUNTER — Encounter: Payer: Self-pay | Admitting: Family Medicine

## 2020-11-18 VITALS — BP 100/62 | HR 68 | Temp 96.9°F | Ht 62.0 in | Wt 128.8 lb

## 2020-11-18 DIAGNOSIS — Z0000 Encounter for general adult medical examination without abnormal findings: Secondary | ICD-10-CM

## 2020-11-18 DIAGNOSIS — Z780 Asymptomatic menopausal state: Secondary | ICD-10-CM

## 2020-11-18 DIAGNOSIS — Z13220 Encounter for screening for lipoid disorders: Secondary | ICD-10-CM | POA: Diagnosis not present

## 2020-11-18 DIAGNOSIS — R748 Abnormal levels of other serum enzymes: Secondary | ICD-10-CM | POA: Diagnosis not present

## 2020-11-18 DIAGNOSIS — Z124 Encounter for screening for malignant neoplasm of cervix: Secondary | ICD-10-CM

## 2020-11-18 DIAGNOSIS — Z13820 Encounter for screening for osteoporosis: Secondary | ICD-10-CM | POA: Diagnosis not present

## 2020-11-18 DIAGNOSIS — K219 Gastro-esophageal reflux disease without esophagitis: Secondary | ICD-10-CM | POA: Diagnosis not present

## 2020-11-18 LAB — COMPREHENSIVE METABOLIC PANEL
ALT: 14 U/L (ref 0–35)
AST: 15 U/L (ref 0–37)
Albumin: 4.3 g/dL (ref 3.5–5.2)
Alkaline Phosphatase: 82 U/L (ref 39–117)
BUN: 17 mg/dL (ref 6–23)
CO2: 30 mEq/L (ref 19–32)
Calcium: 9.5 mg/dL (ref 8.4–10.5)
Chloride: 102 mEq/L (ref 96–112)
Creatinine, Ser: 0.86 mg/dL (ref 0.40–1.20)
GFR: 70.3 mL/min (ref >60.00)
Glucose, Bld: 90 mg/dL (ref 70–99)
Potassium: 4.2 mEq/L (ref 3.5–5.1)
Sodium: 141 mEq/L (ref 135–145)
Total Bilirubin: 0.5 mg/dL (ref 0.2–1.2)
Total Protein: 7 g/dL (ref 6.0–8.3)

## 2020-11-18 LAB — CBC
HCT: 41.4 % (ref 36.0–46.0)
Hemoglobin: 14 g/dL (ref 12.0–15.0)
MCHC: 33.8 g/dL (ref 30.0–36.0)
MCV: 82.8 fl (ref 78.0–100.0)
Platelets: 231 10*3/uL (ref 150.0–400.0)
RBC: 5 Mil/uL (ref 3.87–5.11)
RDW: 13 % (ref 11.5–15.5)
WBC: 4.7 10*3/uL (ref 4.0–10.5)

## 2020-11-18 LAB — LIPID PANEL
Cholesterol: 248 mg/dL — ABNORMAL HIGH (ref 0–200)
HDL: 65 mg/dL (ref >39.00)
LDL Cholesterol: 158 mg/dL — ABNORMAL HIGH (ref 0–99)
NonHDL: 182.62
Total CHOL/HDL Ratio: 4
Triglycerides: 122 mg/dL (ref 0.0–149.0)
VLDL: 24.4 mg/dL (ref 0.0–40.0)

## 2020-11-18 LAB — VITAMIN D 25 HYDROXY (VIT D DEFICIENCY, FRACTURES): VITD: 36.34 ng/mL (ref 30.00–100.00)

## 2020-11-18 NOTE — Progress Notes (Signed)
Jasmine Bradford is a 65 y.o. female  Chief Complaint  Patient presents with  . Annual Exam    Pt is fasting for lab work. Pt will receive pap today, she need a referral for dexa scan.  Pt has no concerns today.    HPI: Jasmine Bradford is a 65 y.o. female seen today for annual exam, fasting labs, PAP.  husband dx w/ SCLC in 06/2020 - undergoing chemo.  Last PAP: due today Last mammo: 10/2020 Last Dexa: ordered 10/2019 but not done Last colonoscopy: 09/2018 - w/ [facility]  Dental: UTD Vision: UTD  Med refills needed today? None  Pt w/ h/o GERD, now controlled with prilosec and dietary changes/avoidance of trigger foods. Appetite is good.    Past Medical History:  Diagnosis Date  . Cholelithiasis with choledocholithiasis   . Diverticulitis   . Tobacco abuse   . Varicose vein of leg    no problems now  . Wears glasses     Past Surgical History:  Procedure Laterality Date  . BILIARY DILATION  02/19/2020   Procedure: BILIARY DILATION;  Surgeon: Jackquline Denmark, MD;  Location: [facility] ENDOSCOPY;  Service: Endoscopy;;  . CESAREAN SECTION    . CHOLECYSTECTOMY N/A 05/23/2016   Procedure: LAPAROSCOPIC CHOLECYSTECTOMY WITH INTRAOPERATIVE CHOLANGIOGRAM;  Surgeon: Jackolyn Confer, MD;  Location: [place];  Service: General;  Laterality: N/A;  . ENDOSCOPIC RETROGRADE CHOLANGIOPANCREATOGRAPHY (ERCP) WITH PROPOFOL N/A 06/06/2016   Procedure: ENDOSCOPIC RETROGRADE CHOLANGIOPANCREATOGRAPHY (ERCP) WITH PROPOFOL;  Surgeon: Clarene Essex, MD;  Location: [facility] ENDOSCOPY;  Service: Endoscopy;  Laterality: N/A;  . ENDOSCOPIC RETROGRADE CHOLANGIOPANCREATOGRAPHY (ERCP) WITH PROPOFOL N/A 02/19/2020   Procedure: ENDOSCOPIC RETROGRADE CHOLANGIOPANCREATOGRAPHY (ERCP) WITH PROPOFOL;  Surgeon: Jackquline Denmark, MD;  Location: [facility] ENDOSCOPY;  Service: Endoscopy;  Laterality: N/A;  . ERCP N/A 03/09/2016   Procedure: ENDOSCOPIC RETROGRADE CHOLANGIOPANCREATOGRAPHY (ERCP);  Surgeon: Clarene Essex, MD;  Location: [facility]  ENDOSCOPY;  Service: Endoscopy;  Laterality: N/A;  . ERCP N/A 06/12/2018   Procedure: ENDOSCOPIC RETROGRADE CHOLANGIOPANCREATOGRAPHY (ERCP);  Surgeon: Clarene Essex, MD;  Location: [facility] ENDOSCOPY;  Service: Endoscopy;  Laterality: N/A;  . ESOPHAGOGASTRODUODENOSCOPY N/A 06/12/2018   Procedure: ESOPHAGOGASTRODUODENOSCOPY (EGD);  Surgeon: Clarene Essex, MD;  Location: [facility] ENDOSCOPY;  Service: Endoscopy;  Laterality: N/A;  . laser vein surgery     last year  . REMOVAL OF STONES  06/12/2018   Procedure: REMOVAL OF STONES;  Surgeon: Clarene Essex, MD;  Location: [facility] ENDOSCOPY;  Service: Endoscopy;;  . REMOVAL OF STONES  02/19/2020   Procedure: REMOVAL OF STONES;  Surgeon: Jackquline Denmark, MD;  Location: [facility] ENDOSCOPY;  Service: Endoscopy;;  . Joan Mayans  06/12/2018   Procedure: Joan Mayans;  Surgeon: Clarene Essex, MD;  Location: [facility] ENDOSCOPY;  Service: Endoscopy;;  . Joan Mayans  02/19/2020   Procedure: SPHINCTEROTOMY;  Surgeon: Jackquline Denmark, MD;  Location: [place];  Service: Endoscopy;;  . TUBAL LIGATION    . VARICOSE VEIN SURGERY Left     Social History   Socioeconomic History  . Marital status: Married    Spouse name: Not on file  . Number of children: Not on file  . Years of education: Not on file  . Highest education level: Not on file  Occupational History  . Not on file  Tobacco Use  . Smoking status: Former Smoker    Packs/day: 0.50    Years: 35.00    Pack years: 17.50    Types: Cigarettes  . Smokeless tobacco: Never Used  . Tobacco comment: No smoing since 02/2016- "wearing Nicotine  patch"  Vaping Use  . Vaping Use: Never used  Substance and Sexual Activity  . Alcohol use: No    Alcohol/week: 0.0 standard drinks  . Drug use: No  . Sexual activity: Not on file  Other Topics Concern  . Not on file  Social History Narrative  . Not on file   Social Determinants of Health   Financial Resource Strain: Not on file  Food Insecurity: Not on file  Transportation Needs:  Not on file  Physical Activity: Not on file  Stress: Not on file  Social Connections: Not on file  Intimate Partner Violence: Not on file    Family History  Problem Relation Age of Onset  . Memory loss Mother   . Ovarian cancer Mother   . Colon polyps Neg Hx   . Colon cancer Neg Hx      Immunization History  Administered Date(s) Administered  . Fluad Quad(high Dose 65+) 04/25/2020  . Influenza, Quadrivalent, Recombinant, Inj, Pf 06/09/2019  . Influenza,inj,Quad PF,6+ Mos 06/07/2016  . PFIZER(Purple Top)SARS-COV-2 Vaccination 10/13/2019, 11/04/2019, 06/13/2020  . Pneumococcal Conjugate-13 06/09/2019  . Pneumococcal Polysaccharide-23 06/20/2020  . Tdap 02/10/2015    Outpatient Encounter Medications as of 11/18/2020  Medication Sig  . acetaminophen (TYLENOL) 500 MG tablet Take 500 mg by mouth every 6 (six) hours as needed.  . Carboxymethylcellulose Sodium 0.25 % SOLN Place 1 drop into both eyes daily.  . cetirizine (ZYRTEC) 10 MG tablet Take 10 mg by mouth daily as needed for allergies.  . famotidine (PEPCID) 20 MG tablet TAKE 1 TABLET BY MOUTH EVERYDAY AT BEDTIME  . ibuprofen (ADVIL) 200 MG tablet Take 200 mg by mouth every 6 (six) hours as needed for headache or moderate pain.   . Multiple Vitamin (MULTIVITAMIN WITH MINERALS) TABS tablet Take 1 tablet by mouth daily with lunch.  Marland Kitchen omeprazole (PRILOSEC) 40 MG capsule TAKE 1 CAPSULE BY MOUTH EVERY DAY  . ursodiol (ACTIGALL) 250 MG tablet Take 1 tablet (250 mg total) by mouth in the morning and at bedtime. May take (1) - 250 mg twice daily or  (2) - 500 mg once daily.   No facility-administered encounter medications on file as of 11/18/2020.     ROS: Gen: no fever, chills  Skin: no rash, itching ENT: no ear pain, ear drainage, nasal congestion, rhinorrhea, sinus pressure, sore throat Eyes: no blurry vision, double vision Resp: no cough, wheeze,SOB Breast: no breast tenderness, no nipple discharge, no breast masses CV: no CP,  palpitations, LE edema,  GI: no heartburn, n/v/d/c, abd pain GU: no dysuria, urgency, frequency, hematuria; no vaginal itching, odor, discharge MSK: no joint pain, myalgias, back pain Neuro: no dizziness, headache, weakness, vertigo Psych: no depression, anxiety, insomnia   Allergies  Allergen Reactions  . Oxycodone Nausea And Vomiting and Other (See Comments)    Also made patient lightheaded and lethargic  . Potassium Itching    BP 100/62 (BP Location: Left Arm, Patient Position: Sitting, Cuff Size: Normal)   Pulse 68   Temp (!) 96.9 F (36.1 C) (Temporal)   Ht 5\' 2"  (1.575 m)   Wt 128 lb 12.8 oz (58.4 kg)   SpO2 98%   BMI 23.56 kg/m   Wt Readings from Last 3 Encounters:  11/18/20 128 lb 12.8 oz (58.4 kg)  04/17/20 125 lb (56.7 kg)  03/04/20 124 lb 6.4 oz (56.4 kg)   Temp Readings from Last 3 Encounters:  11/18/20 (!) 96.9 F (36.1 C) (Temporal)  03/04/20 (!) 97.2 F (  36.2 C) (Temporal)  02/25/20 98.3 F (36.8 C)   BP Readings from Last 3 Encounters:  11/18/20 100/62  04/17/20 110/60  03/04/20 100/70   Pulse Readings from Last 3 Encounters:  11/18/20 68  04/17/20 88  03/04/20 93     Physical Exam Exam conducted with a chaperone present.  Constitutional:      General: She is not in acute distress.    Appearance: She is well-developed.  HENT:     Head: Normocephalic and atraumatic.     Right Ear: Tympanic membrane and ear canal normal.     Left Ear: Tympanic membrane and ear canal normal.     Nose: Nose normal.     Mouth/Throat:     Mouth: Mucous membranes are moist.  Eyes:     Conjunctiva/sclera: Conjunctivae normal.  Neck:     Thyroid: No thyromegaly.  Cardiovascular:     Rate and Rhythm: Normal rate and regular rhythm.     Heart sounds: Normal heart sounds. No murmur heard.   Pulmonary:     Effort: Pulmonary effort is normal. No respiratory distress.     Breath sounds: Normal breath sounds. No wheezing or rhonchi.  Abdominal:     General:  Bowel sounds are normal. There is no distension.     Palpations: Abdomen is soft. There is no mass.     Tenderness: There is no abdominal tenderness.  Genitourinary:    General: Normal vulva.     Labia:        Right: Lesion (cyst, non-tender, not new as per pt) present. No rash or tenderness.        Left: No rash, tenderness or lesion.      Vagina: No vaginal discharge, erythema, tenderness, bleeding or lesions.     Cervix: No cervical motion tenderness, discharge, friability, lesion or cervical bleeding.     Uterus: Not deviated and not enlarged.      Adnexa: Right adnexa normal and left adnexa normal.  Musculoskeletal:     Cervical back: Neck supple.     Right lower leg: No edema.     Left lower leg: No edema.  Lymphadenopathy:     Cervical: No cervical adenopathy.  Skin:    General: Skin is warm and dry.  Neurological:     Mental Status: She is alert and oriented to person, place, and time.     Motor: No abnormal muscle tone.     Coordination: Coordination normal.  Psychiatric:        Mood and Affect: Mood normal.        Behavior: Behavior normal.      A/P:  1. Gastroesophageal reflux disease, unspecified whether esophagitis present - controlled - cont prilosec 40mg  daily - pt takes pepcid PRN - cont dietary modification   2. Elevated liver enzymes - Comprehensive metabolic panel  3. Screening for cervical cancer - Cytology - PAP( [place])  4. Annual physical exam - discussed importance of regular CV exercise, healthy diet, adequate sleep - PAP today, mammo UTD, dexa referral placed, colonoscopy UTD - UTD on dental and vision - Comprehensive metabolic panel - CBC - Lipid panel - next CPE in 1 year  5. Screening for osteoporosis 6. Postmenopausal estrogen deficiency - DG Bone Density; Future - VITAMIN D 25 Hydroxy (Vit-D Deficiency, Fractures)  7. Screening for lipid disorders - Lipid panel    This visit occurred during the SARS-CoV-2 public health  emergency.  Safety protocols were in place, including screening questions  prior to the visit, additional usage of staff PPE, and extensive cleaning of exam room while observing appropriate contact time as indicated for disinfecting solutions.

## 2020-11-19 LAB — CYTOLOGY - PAP: Diagnosis: NEGATIVE

## 2020-12-09 ENCOUNTER — Other Ambulatory Visit: Payer: Self-pay | Admitting: Family Medicine

## 2020-12-09 DIAGNOSIS — Z13820 Encounter for screening for osteoporosis: Secondary | ICD-10-CM

## 2020-12-09 DIAGNOSIS — Z780 Asymptomatic menopausal state: Secondary | ICD-10-CM

## 2020-12-12 ENCOUNTER — Encounter: Payer: Self-pay | Admitting: Family Medicine

## 2020-12-14 NOTE — Telephone Encounter (Signed)
Please see message and advise.  Thank you. ° °

## 2021-01-20 ENCOUNTER — Telehealth: Payer: Self-pay | Admitting: Family Medicine

## 2021-01-20 NOTE — Telephone Encounter (Signed)
Left message for patient to call back and schedule Medicare Annual Wellness Visit (AWV) in office.   If not able to come in office, please offer to do virtually or by telephone.   Due for AWVI  Please schedule at anytime with Nurse Health Advisor.   

## 2021-02-03 ENCOUNTER — Encounter: Payer: Self-pay | Admitting: Family Medicine

## 2021-02-05 ENCOUNTER — Ambulatory Visit
Admission: RE | Admit: 2021-02-05 | Discharge: 2021-02-05 | Disposition: A | Discharge status: Home / Self Care | Payer: Medicare HMO | Source: Ambulatory Visit | Attending: Family Medicine | Admitting: Family Medicine

## 2021-02-05 ENCOUNTER — Other Ambulatory Visit: Payer: Self-pay

## 2021-02-05 DIAGNOSIS — Z780 Asymptomatic menopausal state: Secondary | ICD-10-CM | POA: Diagnosis not present

## 2021-02-05 DIAGNOSIS — Z13820 Encounter for screening for osteoporosis: Secondary | ICD-10-CM

## 2021-02-05 DIAGNOSIS — M810 Age-related osteoporosis without current pathological fracture: Secondary | ICD-10-CM | POA: Diagnosis not present

## 2021-02-05 DIAGNOSIS — M8588 Other specified disorders of bone density and structure, other site: Secondary | ICD-10-CM | POA: Diagnosis not present

## 2021-02-18 ENCOUNTER — Encounter: Payer: Self-pay | Admitting: Family Medicine

## 2021-02-18 DIAGNOSIS — M810 Age-related osteoporosis without current pathological fracture: Secondary | ICD-10-CM

## 2021-02-19 MED ORDER — ALENDRONATE SODIUM 70 MG PO TABS: 70.0000 mg | ORAL_TABLET | ORAL | 4 refills | Status: DC

## 2021-02-22 ENCOUNTER — Other Ambulatory Visit: Payer: Self-pay | Admitting: Gastroenterology

## 2021-02-22 DIAGNOSIS — R1013 Epigastric pain: Secondary | ICD-10-CM

## 2021-03-03 ENCOUNTER — Telehealth: Payer: Self-pay | Admitting: Gastroenterology

## 2021-03-03 ENCOUNTER — Other Ambulatory Visit: Payer: Self-pay

## 2021-03-03 ENCOUNTER — Ambulatory Visit (HOSPITAL_COMMUNITY)
Admission: RE | Admit: 2021-03-03 | Discharge: 2021-03-03 | Disposition: A | Discharge status: Home / Self Care | Payer: Medicare HMO | Source: Ambulatory Visit | Attending: Gastroenterology | Admitting: Gastroenterology

## 2021-03-03 ENCOUNTER — Other Ambulatory Visit (INDEPENDENT_AMBULATORY_CARE_PROVIDER_SITE_OTHER): Payer: Medicare HMO

## 2021-03-03 DIAGNOSIS — Z8719 Personal history of other diseases of the digestive system: Secondary | ICD-10-CM

## 2021-03-03 DIAGNOSIS — K8050 Calculus of bile duct without cholangitis or cholecystitis without obstruction: Secondary | ICD-10-CM | POA: Insufficient documentation

## 2021-03-03 DIAGNOSIS — R1013 Epigastric pain: Secondary | ICD-10-CM

## 2021-03-03 DIAGNOSIS — Z9049 Acquired absence of other specified parts of digestive tract: Secondary | ICD-10-CM | POA: Diagnosis not present

## 2021-03-03 LAB — COMPREHENSIVE METABOLIC PANEL
ALT: 41 U/L — ABNORMAL HIGH (ref 0–35)
AST: 39 U/L — ABNORMAL HIGH (ref 0–37)
Albumin: 4.1 g/dL (ref 3.5–5.2)
Alkaline Phosphatase: 85 U/L (ref 39–117)
BUN: 10 mg/dL (ref 6–23)
CO2: 25 mEq/L (ref 19–32)
Calcium: 8.3 mg/dL — ABNORMAL LOW (ref 8.4–10.5)
Chloride: 102 mEq/L (ref 96–112)
Creatinine, Ser: 0.78 mg/dL (ref 0.40–1.20)
GFR: 78.88 mL/min (ref >60.00)
Glucose, Bld: 115 mg/dL — ABNORMAL HIGH (ref 70–99)
Potassium: 3.7 mEq/L (ref 3.5–5.1)
Sodium: 137 mEq/L (ref 135–145)
Total Bilirubin: 1 mg/dL (ref 0.2–1.2)
Total Protein: 6.7 g/dL (ref 6.0–8.3)

## 2021-03-03 NOTE — Telephone Encounter (Signed)
Patient has been scheduled for a RUQ [place] at [facility] today, patient will need to arrive at 3:45 PM and scan will begin at 4 PM. Patient is aware that she can have anything to eat or drink, only small sips of water if she needs it. Patient verbalized understanding of all information and had no concerns at the end of the call.

## 2021-03-03 NOTE — Telephone Encounter (Signed)
Spoke with patient, she states that she is "97% sure that she has gallstones again." Patient reports that symptoms started Monday after work. Patient reports sharp pain, chills, slight fever, nausea, and headache. Patient states that she had to take off work yesterday. She reports that she has not vomited yet and is trying to avoid that because once she starts she can't stop. She states that she used Excedrin which dulled the pain but it is still there. She states that the pain is more to the right side of her chest but goes all the way across her chest. She states that she feels exactly how she did last year when she had gallstones. Please advise, thanks.

## 2021-03-03 NOTE — Telephone Encounter (Signed)
Spoke with patient in regards to recommendations. Patient is going to get lab work now. She is aware that I am working on the [place] appt at this time and will call her back with that information. Advised patient to not have anything else to eat or drink. She states that she has been sipping water and had about 4-5 crackers within the last 3 hours but that was making her nauseous.

## 2021-03-03 NOTE — Telephone Encounter (Signed)
Inbound call from patient requesting a call from a nurse please.  Patient believes she may have gallstones.  Please advise.

## 2021-03-04 ENCOUNTER — Other Ambulatory Visit: Payer: Self-pay

## 2021-03-04 DIAGNOSIS — K8050 Calculus of bile duct without cholangitis or cholecystitis without obstruction: Secondary | ICD-10-CM

## 2021-03-04 DIAGNOSIS — R7989 Other specified abnormal findings of blood chemistry: Secondary | ICD-10-CM

## 2021-03-04 DIAGNOSIS — Z8719 Personal history of other diseases of the digestive system: Secondary | ICD-10-CM

## 2021-03-04 LAB — CBC
HCT: 38.1 % (ref 36.0–46.0)
Hemoglobin: 13.1 g/dL (ref 12.0–15.0)
MCHC: 34.4 g/dL (ref 30.0–36.0)
MCV: 83.5 fl (ref 78.0–100.0)
Platelets: 192 10*3/uL (ref 150.0–400.0)
RBC: 4.56 Mil/uL (ref 3.87–5.11)
RDW: 13.3 % (ref 11.5–15.5)
WBC: 6.2 10*3/uL (ref 4.0–10.5)

## 2021-03-08 ENCOUNTER — Other Ambulatory Visit: Payer: Self-pay

## 2021-03-08 ENCOUNTER — Other Ambulatory Visit (INDEPENDENT_AMBULATORY_CARE_PROVIDER_SITE_OTHER): Payer: Medicare HMO

## 2021-03-08 ENCOUNTER — Telehealth: Payer: Self-pay

## 2021-03-08 DIAGNOSIS — Z8719 Personal history of other diseases of the digestive system: Secondary | ICD-10-CM

## 2021-03-08 DIAGNOSIS — K8050 Calculus of bile duct without cholangitis or cholecystitis without obstruction: Secondary | ICD-10-CM

## 2021-03-08 DIAGNOSIS — R7989 Other specified abnormal findings of blood chemistry: Secondary | ICD-10-CM

## 2021-03-08 NOTE — Telephone Encounter (Signed)
-----   Message from Yevette Edwards, RN sent at 03/04/2021 10:57 AM EDT ----- Regarding: Labs Repeat LFT's, order in epic.

## 2021-03-08 NOTE — Telephone Encounter (Signed)
Spoke with patient to remind her that she is due for repeat labs at this time. No appointment is necessary. Patient is aware that she can stop by the lab at her convenience. Patient states that she will go later today after work. Patient verbalized understanding and had no concerns at the end of the call.

## 2021-03-09 LAB — HEPATIC FUNCTION PANEL
ALT: 70 U/L — ABNORMAL HIGH (ref 0–35)
AST: 38 U/L — ABNORMAL HIGH (ref 0–37)
Albumin: 4 g/dL (ref 3.5–5.2)
Alkaline Phosphatase: 86 U/L (ref 39–117)
Bilirubin, Direct: 0.1 mg/dL (ref 0.0–0.3)
Total Bilirubin: 0.4 mg/dL (ref 0.2–1.2)
Total Protein: 6.4 g/dL (ref 6.0–8.3)

## 2021-03-12 ENCOUNTER — Telehealth: Payer: Self-pay | Admitting: General Surgery

## 2021-03-12 DIAGNOSIS — K8070 Calculus of gallbladder and bile duct without cholecystitis without obstruction: Secondary | ICD-10-CM

## 2021-03-12 DIAGNOSIS — K831 Obstruction of bile duct: Secondary | ICD-10-CM

## 2021-03-12 NOTE — Telephone Encounter (Signed)
Patient contacted the office and went over the labs and explained we would like her to come back 03/18/2021 for a repeat to check if they are downtrending. The patient will return [facility] on 03/18/2021. She states she is feeling a lot better.

## 2021-03-12 NOTE — Telephone Encounter (Signed)
LVM for the patient to call back for results to labs, and to see how she is feeling. Orders placed for her to have labs done at [facility] on 03/18/2021 to make sure level are downtrending.

## 2021-03-12 NOTE — Telephone Encounter (Signed)
-----   Message from Villalba, DO sent at 03/11/2021  7:38 AM EDT ----- Liver enzymes are still slightly elevated with AST/ALT 38/70 (from 39/41 5 days prior).  Normal bilirubin, alkaline phosphatase, and protein/albumin.  If she is otherwise feeling okay, recommend repeat LFT panel in 10 days to see that this is downtrending back to baseline.

## 2021-03-16 ENCOUNTER — Encounter: Payer: Self-pay | Admitting: Family Medicine

## 2021-03-18 ENCOUNTER — Other Ambulatory Visit: Payer: Self-pay

## 2021-03-18 ENCOUNTER — Other Ambulatory Visit (INDEPENDENT_AMBULATORY_CARE_PROVIDER_SITE_OTHER): Payer: Medicare HMO

## 2021-03-18 DIAGNOSIS — K831 Obstruction of bile duct: Secondary | ICD-10-CM | POA: Diagnosis not present

## 2021-03-18 DIAGNOSIS — K8070 Calculus of gallbladder and bile duct without cholecystitis without obstruction: Secondary | ICD-10-CM | POA: Diagnosis not present

## 2021-03-19 LAB — HEPATIC FUNCTION PANEL
ALT: 22 U/L (ref 0–35)
AST: 21 U/L (ref 0–37)
Albumin: 4.1 g/dL (ref 3.5–5.2)
Alkaline Phosphatase: 80 U/L (ref 39–117)
Bilirubin, Direct: 0.1 mg/dL (ref 0.0–0.3)
Total Bilirubin: 0.4 mg/dL (ref 0.2–1.2)
Total Protein: 6.8 g/dL (ref 6.0–8.3)

## 2021-04-06 ENCOUNTER — Other Ambulatory Visit: Payer: Self-pay

## 2021-04-06 ENCOUNTER — Ambulatory Visit: Payer: Medicare HMO | Admitting: Gastroenterology

## 2021-04-06 ENCOUNTER — Encounter: Payer: Self-pay | Admitting: Gastroenterology

## 2021-04-06 VITALS — BP 100/68 | HR 74 | Ht 61.0 in | Wt 124.0 lb

## 2021-04-06 DIAGNOSIS — R7989 Other specified abnormal findings of blood chemistry: Secondary | ICD-10-CM

## 2021-04-06 DIAGNOSIS — K8050 Calculus of bile duct without cholangitis or cholecystitis without obstruction: Secondary | ICD-10-CM | POA: Diagnosis not present

## 2021-04-06 DIAGNOSIS — K219 Gastro-esophageal reflux disease without esophagitis: Secondary | ICD-10-CM

## 2021-04-06 DIAGNOSIS — Z86010 Personal history of colonic polyps: Secondary | ICD-10-CM

## 2021-04-06 NOTE — Progress Notes (Signed)
Chief Complaint:    Ultrasound, lab follow-up   GI History: 88 old female follows in the GI clinic for the following:   1) GERD: Index symptoms of dyspepsia, globus sensation.  No heartburn or regurgitation.  No dysphagia.  Had been well-controlled with Prilosec 40 mg/day. Possible bile acid reflux overlap.   2) Choledocholithiasis: History of cholelithiasis s/p ccy with IOC in 05/2016 with subsequent ERCP in 05/2018.  Readmitted 01/2020 with choledocholithiasis, underwent repeat ERCP with stone extraction.  Complicated by readmission 02/23/2020 with HCAP, pleural effusion treated with ABX and thoracentesis.   3) History of colon polyps: Due for repeat colonoscopy in 2023 for ongoing polyp surveillance   Endoscopic history: -ERCP (02/2016, Dr. Watt Climes, [place]): Choledocholithiasis, removed with sphincterotomy and balloon sweeps -ERCP (05/2016, Dr. Watt Climes): Choledocholithiasis, removed with further sphincterotomy and balloon sweeps with removal of stones and sludge -EGD (05/2018, Dr. Watt Climes): Normal -ERCP (05/2018, Dr. Watt Climes): No stones on cholangiogram.  Balloon sweeps (nothing removed) and dilation of distal CBD/ampulla with 4 cm x 8 mm balloon dilator to facilitate improved drainage -Colonoscopy (09/2018, Dr. Watt Climes): External/internal hemorrhoids, sigmoid diverticulosis, 7 polyps resected (no path review), normal TI.  Recommended repeat in 3 years -ERCP (01/2020, Dr. Lyndel Safe): Removal of multiple stones and pus.  Normal post occlusion cholangiogram   -Abdominal ultrasound (05/2018): Surgically absent GB, CBD 10.1 mm tapering to 7.1 mm.  Increased hepatic echotexture  HPI:     Patient is a 65 y.o. female presenting to the Gastroenterology Clinic for follow-up.  Last seen by me on 04/17/2020.  At that time was doing well.  We decided to trial course of ursodiol 2 to 50 mg bid for possible stone dissolution given recurrent choledocholithiasis.  Did have recurrence of abdominal pain, nausea, headache in  July.  Thankfully evaluation unremarkable, including no recurrence of CDL. 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  CBD 4 mm. - Labs on 03/03/2021: Normal CBC.  AST/ALT 39/41 with normal T bili and ALP - Labs on 03/08/2021: AST/ALT 38/70 with normal T bili and ALP - Labs on 03/18/2021: AST/ALT 21/22 with normal T bili and ALP  Today, states "I feel amazing". No reflux sxs on omeprazole. Has made a few dietary modifications with intentional weight loss. Has started taking Probiotic along with MVI with D. Bowel habits normal.   Separately, husband was diagnosed with non-small cell lung cancer, currently going through chemotherapy and immunotherapy.  Review of systems:     No chest pain, no SOB, no fevers, no urinary sx   Past Medical History:  Diagnosis Date   Cholelithiasis with choledocholithiasis    Diverticulitis    Tobacco abuse    Varicose vein of leg    no problems now   Wears glasses     Patient's surgical history, family medical history, social history, medications and allergies were all reviewed in Epic    Current Outpatient Medications  Medication Sig Dispense Refill   acetaminophen (TYLENOL) 500 MG tablet Take 500 mg by mouth every 6 (six) hours as needed.     alendronate (FOSAMAX) 70 MG tablet Take 1 tablet (70 mg total) by mouth every 7 (seven) days. Take with a full glass of water on an empty stomach. 12 tablet 4   Carboxymethylcellulose Sodium 0.25 % SOLN Place 1 drop into both eyes daily.     cetirizine (ZYRTEC) 10 MG tablet Take 10 mg by mouth daily as needed for allergies.     famotidine (PEPCID) 20 MG  tablet TAKE 1 TABLET BY MOUTH EVERYDAY AT BEDTIME 90 tablet 1   ibuprofen (ADVIL) 200 MG tablet Take 200 mg by mouth every 6 (six) hours as needed for headache or moderate pain.      Multiple Vitamin (MULTIVITAMIN WITH MINERALS) TABS tablet Take 1 tablet by mouth daily with lunch.     omeprazole (PRILOSEC) 40 MG  capsule TAKE 1 CAPSULE BY MOUTH EVERY DAY 90 capsule 0   ursodiol (ACTIGALL) 250 MG tablet Take 1 tablet (250 mg total) by mouth in the morning and at bedtime. May take (1) - 250 mg twice daily or  (2) - 500 mg once daily. 60 tablet 11   No current facility-administered medications for this visit.    Physical Exam:     There were no vitals taken for this visit.  GENERAL:  Pleasant female in NAD PSYCH: : Cooperative, normal affect Musculoskeletal:  Normal muscle tone, normal strength NEURO: Alert and oriented x 3, no focal neurologic deficits   IMPRESSION and PLAN:    1) History of choledocholithiasis - No recurrence since ERCP in 01/2020 and starting ursodiol in 02/2020 - Continue ursodiol for stone dissolution/choledocholithiasis prevention - Most recent liver enzymes normalized   2) GERD -Well-controlled with Prilosec 40 mg/day - No recent breakthrough symptoms - Continue antireflux lifestyle/dietary modifications   3) Diarrhea -Resolved  4) Elevated liver enzymes - Suspect recent mild elevation in AST/ALT (with normal T bili and ALP) was more likely from acute infectious etiology (did have fever to 101, chills, generalized aches).  Enzymes have since normalized.  RUQ [place] was unrevealing. - No further work-up needed at this time   5) Colon polyps -Repeat colonoscopy in 2023 for ongoing polyp surveillance  RTC prn         Lavena Bullion ,DO, FACG 04/06/2021, 2:16 PM

## 2021-04-06 NOTE — Patient Instructions (Signed)
If you are age 67 or older, your body mass index should be between 23-30. Your Body mass index is 23.43 kg/m. If this is out of the aforementioned range listed, please consider follow up with your Primary Care Provider.  If you are age 21 or younger, your body mass index should be between 19-25. Your Body mass index is 23.43 kg/m. If this is out of the aformentioned range listed, please consider follow up with your Primary Care Provider.   __________________________________________________________  The [place] [facility] providers would like to encourage you to use [facility] to communicate with providers for non-urgent requests or questions.  Due to long hold times on the telephone, sending your provider a message by [facility] may be a faster and more efficient way to get a response.  Please allow 48 business hours for a response.  Please remember that this is for non-urgent requests.   It was a pleasure to see you today!  Vito Cirigliano, D.O.

## 2021-04-13 DIAGNOSIS — H524 Presbyopia: Secondary | ICD-10-CM | POA: Diagnosis not present

## 2021-04-13 DIAGNOSIS — H52209 Unspecified astigmatism, unspecified eye: Secondary | ICD-10-CM | POA: Diagnosis not present

## 2021-04-13 DIAGNOSIS — H5203 Hypermetropia, bilateral: Secondary | ICD-10-CM | POA: Diagnosis not present

## 2021-04-19 ENCOUNTER — Other Ambulatory Visit: Payer: Self-pay | Admitting: Gastroenterology

## 2021-04-19 ENCOUNTER — Other Ambulatory Visit: Payer: Self-pay

## 2021-04-19 MED ORDER — FAMOTIDINE 20 MG PO TABS
ORAL_TABLET | ORAL | 1 refills | Status: DC
Start: 2021-04-19 — End: 2021-10-18

## 2021-04-19 NOTE — Progress Notes (Signed)
RX script from fax came in for refill

## 2021-05-15 ENCOUNTER — Ambulatory Visit (INDEPENDENT_AMBULATORY_CARE_PROVIDER_SITE_OTHER): Payer: Medicare HMO

## 2021-05-15 DIAGNOSIS — Z0000 Encounter for general adult medical examination without abnormal findings: Secondary | ICD-10-CM

## 2021-05-15 NOTE — Progress Notes (Signed)
Subjective:   Jasmine Bradford is a 65 y.o. female who presents for Medicare Annual (Subsequent) preventive examination.  I connected with  [name] on 05/15/21 by an audio only telemedicine application and verified that I am speaking with the correct person using two identifiers.   I discussed the limitations, risks, security and privacy concerns of performing an evaluation and management service by telephone and the availability of in person appointments. I also discussed with the patient that there may be a patient responsible charge related to this service. The patient expressed understanding and verbally consented to this telephonic visit.  Location of Patient: Home Location of Provider: Office   List any persons and their role that are participating in the visit with the patient.    Review of Systems    Refer to PCP       Objective:    There were no vitals filed for this visit. There is no height or weight on file to calculate BMI.  Advanced Directives 02/23/2020 02/23/2020 02/23/2020 06/12/2018 06/04/2016 05/16/2016 03/03/2016  Does Patient Have a Medical Advance Directive? No No No No No No No  Would patient like information on creating a medical advance directive? No - Patient declined No - Patient declined Yes (ED - Information included in AVS) No - Patient declined No - patient declined information Yes - Educational materials given -    Current Medications (verified) Outpatient Encounter Medications as of 05/15/2021  Medication Sig   acetaminophen (TYLENOL) 500 MG tablet Take 500 mg by mouth every 6 (six) hours as needed.   cetirizine (ZYRTEC) 10 MG tablet Take 10 mg by mouth daily as needed for allergies.   famotidine (PEPCID) 20 MG tablet TAKE 1 TABLET BY MOUTH EVERYDAY AT BEDTIME   Multiple Vitamin (MULTIVITAMIN WITH MINERALS) TABS tablet Take 1 tablet by mouth daily with lunch.   omeprazole (PRILOSEC) 40 MG capsule TAKE 1 CAPSULE BY MOUTH EVERY DAY    ursodiol (ACTIGALL) 250 MG tablet TAKE 1 TABLET IN THE MORNING AND AT BEDTIME. (MAY TAKE 1 TWICE A DAY OR 2 TAB(S) ONCE DAILY.   No facility-administered encounter medications on file as of 05/15/2021.    Allergies (verified) Oxycodone and Potassium   History: Past Medical History:  Diagnosis Date   Cholelithiasis with choledocholithiasis    Diverticulitis    Tobacco abuse    Varicose vein of leg    no problems now   Wears glasses    Past Surgical History:  Procedure Laterality Date   BILIARY DILATION  02/19/2020   Procedure: BILIARY DILATION;  Surgeon: Jackquline Denmark, MD;  Location: [facility] ENDOSCOPY;  Service: Endoscopy;;   CESAREAN SECTION     CHOLECYSTECTOMY N/A 05/23/2016   Procedure: LAPAROSCOPIC CHOLECYSTECTOMY WITH INTRAOPERATIVE CHOLANGIOGRAM;  Surgeon: Jackolyn Confer, MD;  Location: [place];  Service: General;  Laterality: N/A;   ENDOSCOPIC RETROGRADE CHOLANGIOPANCREATOGRAPHY (ERCP) WITH PROPOFOL N/A 06/06/2016   Procedure: ENDOSCOPIC RETROGRADE CHOLANGIOPANCREATOGRAPHY (ERCP) WITH PROPOFOL;  Surgeon: Clarene Essex, MD;  Location: [facility] ENDOSCOPY;  Service: Endoscopy;  Laterality: N/A;   ENDOSCOPIC RETROGRADE CHOLANGIOPANCREATOGRAPHY (ERCP) WITH PROPOFOL N/A 02/19/2020   Procedure: ENDOSCOPIC RETROGRADE CHOLANGIOPANCREATOGRAPHY (ERCP) WITH PROPOFOL;  Surgeon: Jackquline Denmark, MD;  Location: [facility] ENDOSCOPY;  Service: Endoscopy;  Laterality: N/A;   ERCP N/A 03/09/2016   Procedure: ENDOSCOPIC RETROGRADE CHOLANGIOPANCREATOGRAPHY (ERCP);  Surgeon: Clarene Essex, MD;  Location: [facility] ENDOSCOPY;  Service: Endoscopy;  Laterality: N/A;   ERCP N/A 06/12/2018   Procedure: ENDOSCOPIC RETROGRADE CHOLANGIOPANCREATOGRAPHY (ERCP);  Surgeon: Clarene Essex, MD;  Location: [facility] ENDOSCOPY;  Service: Endoscopy;  Laterality: N/A;   ESOPHAGOGASTRODUODENOSCOPY N/A 06/12/2018   Procedure: ESOPHAGOGASTRODUODENOSCOPY (EGD);  Surgeon: Clarene Essex, MD;  Location: [facility] ENDOSCOPY;  Service: Endoscopy;  Laterality: N/A;   laser vein  surgery     last year   REMOVAL OF STONES  06/12/2018   Procedure: REMOVAL OF STONES;  Surgeon: Clarene Essex, MD;  Location: [facility] ENDOSCOPY;  Service: Endoscopy;;   REMOVAL OF STONES  02/19/2020   Procedure: REMOVAL OF STONES;  Surgeon: Jackquline Denmark, MD;  Location: [facility] ENDOSCOPY;  Service: Endoscopy;;   SPHINCTEROTOMY  06/12/2018   Procedure: Joan Mayans;  Surgeon: Clarene Essex, MD;  Location: [facility] ENDOSCOPY;  Service: Endoscopy;;   SPHINCTEROTOMY  02/19/2020   Procedure: Joan Mayans;  Surgeon: Jackquline Denmark, MD;  Location: [facility] ENDOSCOPY;  Service: Endoscopy;;   TUBAL LIGATION     VARICOSE VEIN SURGERY Left    Family History  Problem Relation Age of Onset   Memory loss Mother    Ovarian cancer Mother    Colon polyps Neg Hx    Colon cancer Neg Hx    Social History   Socioeconomic History   Marital status: Married    Spouse name: Nathaneil Canary   Number of children: 4   Years of education: Not on file   Highest education level: Not on file  Occupational History   Not on file  Tobacco Use   Smoking status: Former    Packs/day: 0.50    Years: 35.00    Pack years: 17.50    Types: Cigarettes   Smokeless tobacco: Never   Tobacco comments:    No smoking since 02/2016- "wearing Nicotine patch"  Vaping Use   Vaping Use: Never used  Substance and Sexual Activity   Alcohol use: No    Alcohol/week: 0.0 standard drinks   Drug use: No   Sexual activity: Not Currently  Other Topics Concern   Not on file  Social History Narrative   Not on file   Social Determinants of Health   Financial Resource Strain: Low Risk    Difficulty of Paying Living Expenses: Not hard at all  Food Insecurity: No Food Insecurity   Worried About Charity fundraiser in the Last Year: Never true   Arboriculturist in the Last Year: Never true  Transportation Needs: No Transportation Needs   Lack of Transportation (Medical): No   Lack of Transportation (Non-Medical): No  Physical Activity: Sufficiently Active    Days of Exercise per Week: 7 days   Minutes of Exercise per Session: 30 min  Stress: No Stress Concern Present   Feeling of Stress : Only a little  Social Connections: Moderately Isolated   Frequency of Communication with Friends and Family: More than three times a week   Frequency of Social Gatherings with Friends and Family: Twice a week   Attends Religious Services: Never   Marine scientist or Organizations: No   Attends Archivist Meetings: Never   Marital Status: Married    Tobacco Counseling Counseling given: Not Answered Tobacco comments: No smoking since 02/2016- "wearing Nicotine patch"   Clinical Intake:                 Diabetic?NO         Activities of Daily Living In your present state of health, do you have any difficulty performing the following activities: 05/15/2021  Hearing? N  Vision? N  Difficulty concentrating or making decisions? N  Walking or climbing  stairs? N  Dressing or bathing? N  Doing errands, shopping? N  Some recent data might be hidden    Patient Care Team: Nche, Charlene Brooke, NP as PCP - General (Internal Medicine)  Indicate any recent Medical Services you may have received from other than [facility] providers in the past year (date may be approximate).     Assessment:   This is a routine wellness examination for Katiya.  Hearing/Vision screen No results found.  Dietary issues and exercise activities discussed:     Goals Addressed   None   Depression Screen PHQ 2/9 Scores 05/15/2021 05/15/2021 08/09/2019 02/10/2015  PHQ - 2 Score 0 0 0 0    Fall Risk Fall Risk  05/15/2021  Falls in the past year? 0  Number falls in past yr: 0  Injury with Fall? 0  Risk for fall due to : History of fall(s)  Follow up Falls evaluation completed    [place]:  Any stairs in or around the home? Yes  If so, are there any without handrails? Yes  Home free of loose throw rugs in walkways,  pet beds, electrical cords, etc? Yes  Adequate lighting in your home to reduce risk of falls? Yes   ASSISTIVE DEVICES UTILIZED TO PREVENT FALLS:  Life alert? No  Use of a cane, walker or w/c? No  Grab bars in the bathroom? No  Shower chair or bench in shower? Yes  Elevated toilet seat or a handicapped toilet? No   TIMED UP AND GO:  Was the test performed? No .  Length of time to ambulate 10 feet:  sec.   Telehealth  Cognitive Function:     6CIT Screen 05/15/2021  What Year? 0 points  What month? 0 points  What time? 0 points  Count back from 20 0 points  Months in reverse 0 points  Repeat phrase 0 points  Total Score 0    Immunizations Immunization History  Administered Date(s) Administered   Fluad Quad(high Dose 65+) 04/25/2020   Influenza, Quadrivalent, Recombinant, Inj, Pf 06/09/2019   Influenza,inj,Quad PF,6+ Mos 06/07/2016   PFIZER(Purple Top)SARS-COV-2 Vaccination 10/13/2019, 11/04/2019, 06/13/2020, 01/10/2021   Pneumococcal Conjugate-13 06/09/2019   Pneumococcal Polysaccharide-23 06/20/2020   Tdap 02/10/2015    TDAP status: Up to date  Flu Vaccine status: Up to date  Pneumococcal vaccine status: Up to date  Covid-19 vaccine status: Completed vaccines  Qualifies for Shingles Vaccine? Yes   Zostavax completed Yes   Shingrix Completed?: Yes  Screening Tests Health Maintenance  Topic Date Due   Zoster Vaccines- Shingrix (1 of 2) Never done   INFLUENZA VACCINE  03/22/2021   MAMMOGRAM  11/07/2022   TETANUS/TDAP  02/09/2025   COLONOSCOPY (Pts 45-86yrs Insurance coverage will need to be confirmed)  10/16/2028   DEXA SCAN  Completed   COVID-19 Vaccine  Completed   Hepatitis C Screening  Completed   HPV VACCINES  Aged Out    Health Maintenance  Health Maintenance Due  Topic Date Due   Zoster Vaccines- Shingrix (1 of 2) Never done   INFLUENZA VACCINE  03/22/2021    Colorectal cancer screening: Type of screening: Colonoscopy. Completed  10/16/2018. Repeat every 10 years  Mammogram status: Completed 10/2020. Repeat every year  Bone Density status: Completed 01/2021. Results reflect: Bone density results: OSTEOPOROSIS. Repeat every 2 years.  Lung Cancer Screening: (Low Dose CT Chest recommended if Age 17-80 years, 30 pack-year currently smoking OR have quit w/in 15years.) does not  qualify.   Lung Cancer Screening Referral:   Additional Screening:  Hepatitis C Screening: does qualify;PATIENT DECLINED  Vision Screening: Recommended annual ophthalmology exams for early detection of glaucoma and other disorders of the eye. Is the patient up to date with their annual eye exam?  Yes  Who is the provider or what is the name of the office in which the patient attends annual eye exams? [facility] If pt is not established with a provider, would they like to be referred to a provider to establish care? No .   Dental Screening: Recommended annual dental exams for proper oral hygiene  Community Resource Referral / Chronic Care Management: CRR required this visit?  No   CCM required this visit?  No      Plan:     I have personally reviewed and noted the following in the patient's chart:   Medical and social history Use of alcohol, tobacco or illicit drugs  Current medications and supplements including opioid prescriptions.  Functional ability and status Nutritional status Physical activity Advanced directives List of other physicians Hospitalizations, surgeries, and ER visits in previous 12 months Vitals Screenings to include cognitive, depression, and falls Referrals and appointments  In addition, I have reviewed and discussed with patient certain preventive protocols, quality metrics, and best practice recommendations. A written personalized care plan for preventive services as well as general preventive health recommendations were provided to patient.     [place], CMA   05/15/2021    Nurse Notes: non-face to face 35 minutes

## 2021-05-15 NOTE — Patient Instructions (Signed)
Health Maintenance, Female Adopting a healthy lifestyle and getting preventive care are important in promoting health and wellness. Ask your health care provider about: The right schedule for you to have regular tests and exams. Things you can do on your own to prevent diseases and keep yourself healthy. What should I know about diet, weight, and exercise? Eat a healthy diet  Eat a diet that includes plenty of vegetables, fruits, low-fat dairy products, and lean protein. Do not eat a lot of foods that are high in solid fats, added sugars, or sodium. Maintain a healthy weight Body mass index (BMI) is used to identify weight problems. It estimates body fat based on height and weight. Your health care provider can help determine your BMI and help you achieve or maintain a healthy weight. Get regular exercise Get regular exercise. This is one of the most important things you can do for your health. Most adults should: Exercise for at least 150 minutes each week. The exercise should increase your heart rate and make you sweat (moderate-intensity exercise). Do strengthening exercises at least twice a week. This is in addition to the moderate-intensity exercise. Spend less time sitting. Even light physical activity can be beneficial. Watch cholesterol and blood lipids Have your blood tested for lipids and cholesterol at 67 years of age, then have this test every 5 years. Have your cholesterol levels checked more often if: Your lipid or cholesterol levels are high. You are older than 67 years of age. You are at high risk for heart disease. What should I know about cancer screening? Depending on your health history and family history, you may need to have cancer screening at various ages. This may include screening for: Breast cancer. Cervical cancer. Colorectal cancer. Skin cancer. Lung cancer. What should I know about heart disease, diabetes, and high blood pressure? Blood pressure and heart  disease High blood pressure causes heart disease and increases the risk of stroke. This is more likely to develop in people who have high blood pressure readings, are of African descent, or are overweight. Have your blood pressure checked: Every 3-5 years if you are 18-39 years of age. Every year if you are 40 years old or older. Diabetes Have regular diabetes screenings. This checks your fasting blood sugar level. Have the screening done: Once every three years after age 40 if you are at a normal weight and have a low risk for diabetes. More often and at a younger age if you are overweight or have a high risk for diabetes. What should I know about preventing infection? Hepatitis B If you have a higher risk for hepatitis B, you should be screened for this virus. Talk with your health care provider to find out if you are at risk for hepatitis B infection. Hepatitis C Testing is recommended for: Everyone born from 1945 through 1965. Anyone with known risk factors for hepatitis C. Sexually transmitted infections (STIs) Get screened for STIs, including gonorrhea and chlamydia, if: You are sexually active and are younger than 67 years of age. You are older than 67 years of age and your health care provider tells you that you are at risk for this type of infection. Your sexual activity has changed since you were last screened, and you are at increased risk for chlamydia or gonorrhea. Ask your health care provider if you are at risk. Ask your health care provider about whether you are at high risk for HIV. Your health care provider may recommend a prescription medicine   to help prevent HIV infection. If you choose to take medicine to prevent HIV, you should first get tested for HIV. You should then be tested every 3 months for as long as you are taking the medicine. Pregnancy If you are about to stop having your period (premenopausal) and you may become pregnant, seek counseling before you get  pregnant. Take 400 to 800 micrograms (mcg) of folic acid every day if you become pregnant. Ask for birth control (contraception) if you want to prevent pregnancy. Osteoporosis and menopause Osteoporosis is a disease in which the bones lose minerals and strength with aging. This can result in bone fractures. If you are 65 years old or older, or if you are at risk for osteoporosis and fractures, ask your health care provider if you should: Be screened for bone loss. Take a calcium or vitamin D supplement to lower your risk of fractures. Be given hormone replacement therapy (HRT) to treat symptoms of menopause. Follow these instructions at home: Lifestyle Do not use any products that contain nicotine or tobacco, such as cigarettes, e-cigarettes, and chewing tobacco. If you need help quitting, ask your health care provider. Do not use street drugs. Do not share needles. Ask your health care provider for help if you need support or information about quitting drugs. Alcohol use Do not drink alcohol if: Your health care provider tells you not to drink. You are pregnant, may be pregnant, or are planning to become pregnant. If you drink alcohol: Limit how much you use to 0-1 drink a day. Limit intake if you are breastfeeding. Be aware of how much alcohol is in your drink. In the [place] one drink equals one 12 oz bottle of beer (355 mL), one 5 oz glass of wine (148 mL), or one 1 oz glass of hard liquor (44 mL). General instructions Schedule regular health, dental, and eye exams. Stay current with your vaccines. Tell your health care provider if: You often feel depressed. You have ever been abused or do not feel safe at home. Summary Adopting a healthy lifestyle and getting preventive care are important in promoting health and wellness. Follow your health care provider's instructions about healthy diet, exercising, and getting tested or screened for diseases. Follow your health care provider's  instructions on monitoring your cholesterol and blood pressure. This information is not intended to replace advice given to you by your health care provider. Make sure you discuss any questions you have with your health care provider. Document Revised: 10/16/2020 Document Reviewed: 08/01/2018 Elsevier Patient Education  2022 [employer] Inc.  

## 2021-05-26 ENCOUNTER — Other Ambulatory Visit: Payer: Medicare HMO

## 2021-06-21 ENCOUNTER — Other Ambulatory Visit: Payer: Self-pay

## 2021-06-22 ENCOUNTER — Ambulatory Visit (INDEPENDENT_AMBULATORY_CARE_PROVIDER_SITE_OTHER): Payer: Medicare HMO | Admitting: Nurse Practitioner

## 2021-06-22 ENCOUNTER — Encounter: Payer: Self-pay | Admitting: Nurse Practitioner

## 2021-06-22 VITALS — BP 98/60 | HR 96 | Temp 98.7°F | Ht 62.0 in | Wt 127.0 lb

## 2021-06-22 DIAGNOSIS — M810 Age-related osteoporosis without current pathological fracture: Secondary | ICD-10-CM | POA: Diagnosis not present

## 2021-06-22 NOTE — Assessment & Plan Note (Signed)
Last dexa scan 01/2021: osteoporosis Did not take fosamax as prescribed by previous pcp due to fear of possible side effects. She is not interested in use of prolia injections. Discussed possible side effects and how to take fosamax tab. She verbalized understanding and agreed to start medication

## 2021-06-22 NOTE — Progress Notes (Signed)
   Subjective:  Patient ID: [name], female    DOB: 02-Mar-1956  Age: 65 y.o. MRN: [id number]  CC: Establish Care (TOC-Dr. Cirigliano/Osteoporosis)  HPI  Osteoporosis Last dexa scan 01/2021: osteoporosis Did not take fosamax as prescribed by previous pcp due to fear of possible side effects. She is not interested in use of prolia injections. Discussed possible side effects and how to take fosamax tab. She verbalized understanding and agreed to start medication  Reviewed past Medical, Social and Family history today.  Outpatient Medications Prior to Visit  Medication Sig Dispense Refill   acetaminophen (TYLENOL) 500 MG tablet Take 500 mg by mouth every 6 (six) hours as needed.     alendronate (FOSAMAX) 70 MG tablet Take 70 mg by mouth once a week. Take with a full glass of water on an empty stomach.     cetirizine (ZYRTEC) 10 MG tablet Take 10 mg by mouth daily as needed for allergies.     famotidine (PEPCID) 20 MG tablet TAKE 1 TABLET BY MOUTH EVERYDAY AT BEDTIME 90 tablet 1   Multiple Vitamin (MULTIVITAMIN WITH MINERALS) TABS tablet Take 1 tablet by mouth daily with lunch.     omeprazole (PRILOSEC) 40 MG capsule TAKE 1 CAPSULE BY MOUTH EVERY DAY 90 capsule 0   ursodiol (ACTIGALL) 250 MG tablet TAKE 1 TABLET IN THE MORNING AND AT BEDTIME. (MAY TAKE 1 TWICE A DAY OR 2 TAB(S) ONCE DAILY. 180 tablet 3   No facility-administered medications prior to visit.    ROS See HPI  Objective:  BP 98/60 (BP Location: Left Arm, Patient Position: Sitting, Cuff Size: Normal)   Pulse 96   Temp 98.7 F (37.1 C) (Temporal)   Ht 5\' 2"  (1.575 m)   Wt 127 lb (57.6 kg)   SpO2 97%   BMI 23.23 kg/m   Physical Exam Cardiovascular:     Rate and Rhythm: Normal rate.     Pulses: Normal pulses.  Pulmonary:     Effort: Pulmonary effort is normal.  Neurological:     Mental Status: She is alert and oriented to person, place, and time.    Assessment & Plan:  This visit occurred during  the SARS-CoV-2 public health emergency.  Safety protocols were in place, including screening questions prior to the visit, additional usage of staff PPE, and extensive cleaning of exam room while observing appropriate contact time as indicated for disinfecting solutions.   Havannah was seen today for establish care.  Diagnoses and all orders for this visit:  Age-related osteoporosis without current pathological fracture   Problem List Items Addressed This Visit       Musculoskeletal and Integument   Osteoporosis - Primary    Last dexa scan 01/2021: osteoporosis Did not take fosamax as prescribed by previous pcp due to fear of possible side effects. She is not interested in use of prolia injections. Discussed possible side effects and how to take fosamax tab. She verbalized understanding and agreed to start medication      Relevant Medications   alendronate (FOSAMAX) 70 MG tablet     I have spent 76mins with this patient regarding history taking, documentation, review of labs, GI specialty note, formulating plan and discussing treatment options with patient.  Follow-up: Return in about 4 months (around 10/20/2021) for CPE (fasting).  Wilfred Lacy, NP

## 2021-06-22 NOTE — Patient Instructions (Addendum)
Ok to resume fosamax (alendronate) Continue calcium and vitamin supplement.  Alendronate; Cholecalciferol Oral Tablets What is this medication? ALENDRONATE; CHOLECALCIFEROL (a LEN droe nate; KOL e cal SIF er ol) slows calcium loss from bones. It treats osteoporosis. It may be used in other people at risk for bone loss. This medicine may be used for other purposes; ask your health care provider or pharmacist if you have questions. COMMON BRAND NAME(S): Fosamax Plus D What should I tell my care team before I take this medication? They need to know if you have any of these conditions: bleeding disorder cancer dental disease difficulty swallowing high levels of vitamin D in the blood infection (fever, chills, cough, sore throat, pain or trouble passing urine) kidney disease low levels of calcium or other minerals in the blood low red blood cell counts receiving steroids like dexamethasone or prednisone stomach or intestine problems trouble sitting or standing for 30 minutes an unusual or allergic reaction to alendronate, cholecalciferol, other drugs, foods, dyes or preservatives pregnant or trying to get pregnant breast-feeding How should I use this medication? Take this drug by mouth with a full glass of water. Take it as directed on the prescription label at the same day of each week. Take the dose right after waking up. Do not eat or drink anything before taking it. Do not take it with any other drink except water. Do not chew or crush the tablet. After taking it, do not eat breakfast, drink, or take any other drugs or vitamins for at least 30 minutes. Sit or stand up for at least 30 minutes after you take it. Do not lie down. Keep taking it unless your health care provider tells you to stop. A special MedGuide will be given to you by the pharmacist with each prescription and refill. Be sure to read this information carefully each time. Talk to your health care provider about the use of  this drug in children. Special care may be needed. Overdosage: If you think you have taken too much of this medicine contact a poison control center or emergency room at once. NOTE: This medicine is only for you. Do not share this medicine with others. What if I miss a dose? If you take your drug once a day, skip it. Take your next dose at the scheduled time the next morning. Do not take two doses on the same day. If you take your drug once a week, take the missed dose on the morning after you remember. Do not take two doses on the same day. What may interact with this medication? antacids aspirin and aspirin like drugs calcium supplements, especially calcium with vitamin D cholestyramine or colestipol cimetidine, ranitidine, or other medicines used to decrease stomach acid corticosteroids iron supplements magnesium supplements mineral oil NSAIDs, medicines for pain and inflammation, like ibuprofen or naproxen orlistat phenobarbital phenytoin phosphorous supplements primidone steroid medicines like prednisone or cortisone thiazide diuretics vitamins with minerals, especially with vitamin D This list may not describe all possible interactions. Give your health care provider a list of all the medicines, herbs, non-prescription drugs, or dietary supplements you use. Also tell them if you smoke, drink alcohol, or use illegal drugs. Some items may interact with your medicine. What should I watch for while using this medication? Visit your health care provider for regular checks on your progress. It may be some time before you see the benefit from this drug. Some people who take this drug have severe bone, joint, or muscle pain.  This drug may also increase your risk for jaw problems or a broken thigh bone. Tell your health care provider right away if you have severe pain in your jaw, bones, joints, or muscles. Tell you health care provider if you have any pain that does not go away or that gets  worse. Tell your dentist and dental surgeon that you are taking this drug. You should not have major dental surgery while on this drug. See your dentist to have a dental exam and fix any dental problems before starting this drug. Take good care of your teeth while on this drug. Make sure you see your dentist for regular follow-up appointments. You should make sure you get enough calcium and vitamin D while you are taking this drug. Discuss the foods you eat and the vitamins you take with your health care provider. You may need blood work done while you are taking this drug. What side effects may I notice from receiving this medication? Side effects that you should report to your doctor or health care provider as soon as possible: allergic reactions (skin rash, itching or hives; swelling of the face, lips, or tongue) bone pain heartburn (burning feeling in chest, often after eating or when lying down) jaw pain, especially after dental work joint pain low calcium levels (fast heartbeat; muscle cramps or pain; pain, tingling, or numbness in the hands or feet; seizures) muscle pain painful or difficulty swallowing redness, blistering, peeling, or loosening of the skin, including inside the mouth stomach bleeding (bloody or black, tarry stools; spitting up blood or brown material that looks like coffee grounds) Side effects that usually do not require medical attention (report to your doctor or health care provider if they continue or are bothersome): constipation diarrhea nausea stomach pain This list may not describe all possible side effects. Call your doctor for medical advice about side effects. You may report side effects to FDA at 1-800-FDA-1088. Where should I keep my medication? Keep out of the reach of children and pets. Store at room temperature between 15 and 30 degrees C (59 and 86 degrees F). Protect from light and moisture. Keep this drug in the original packaging until you are  ready to take it. Throw away any unused drug after the expiration date. NOTE: This sheet is a summary. It may not cover all possible information. If you have questions about this medicine, talk to your doctor, pharmacist, or health care provider.  2022 Elsevier/Gold Standard (2019-05-23 08:14:01)

## 2021-08-20 ENCOUNTER — Ambulatory Visit (INDEPENDENT_AMBULATORY_CARE_PROVIDER_SITE_OTHER)
Admission: RE | Admit: 2021-08-20 | Discharge: 2021-08-20 | Disposition: A | Discharge status: Home / Self Care | Payer: Medicare HMO | Source: Ambulatory Visit | Attending: Nurse Practitioner | Admitting: Nurse Practitioner

## 2021-08-20 ENCOUNTER — Encounter: Payer: Self-pay | Admitting: Nurse Practitioner

## 2021-08-20 ENCOUNTER — Ambulatory Visit (INDEPENDENT_AMBULATORY_CARE_PROVIDER_SITE_OTHER): Payer: Medicare HMO | Admitting: Nurse Practitioner

## 2021-08-20 ENCOUNTER — Other Ambulatory Visit: Payer: Self-pay

## 2021-08-20 VITALS — BP 94/60 | HR 85 | Temp 97.8°F | Ht 61.0 in | Wt 127.0 lb

## 2021-08-20 DIAGNOSIS — R0789 Other chest pain: Secondary | ICD-10-CM

## 2021-08-20 DIAGNOSIS — R112 Nausea with vomiting, unspecified: Secondary | ICD-10-CM

## 2021-08-20 DIAGNOSIS — J9811 Atelectasis: Secondary | ICD-10-CM | POA: Diagnosis not present

## 2021-08-20 HISTORY — DX: Nausea with vomiting, unspecified: R11.2

## 2021-08-20 LAB — POC COVID19 BINAXNOW: SARS Coronavirus 2 Ag: NEGATIVE

## 2021-08-20 NOTE — Progress Notes (Signed)
Acute Office Visit  Subjective:    Patient ID: Jasmine Bradford, female    DOB: 06-Jul-1956, 65 y.o.   MRN: [id number]  Chief Complaint  Patient presents with   Gallstones    Had Gallbladder removed. Still gets stones. Most recent episode started 08-17-21 with nausea similar to what she has had in the past. Pain is mid sternal. Had a headache and chills, Vomiting since yesterday. Reflux off and on.     HPI Patient is in today for Gallstones  Tuesday 08/17/2021 she woke up took her omeprazole and then had cerel and over ate a little. States that she felt nausous throughout. Went to bed and felt a pain in the chest and felt the acid in her throat. States she attemtped to vomit without success.  Got up Wednesday 08/18/2021. Went to work in [employer] of a couple hours she got extremily cold, threw up her coffee and central sterunal pain. Got extremly dizzy and lightheaded when she got out of the car and then threw up. She vomited an additional 4-5 times Thursday stayed home from work. States her head doesn't feel right. States around 2 she felt better ate and did not vomit Friday she got up and went to work. Took her omeprazole and drank a frape. Felt the pain in her chest and felt the acid in her throat. Took half a pastery her stomach cramped and she had diarrhea. Then later stomach cramped and had another BM  The chest discomfort is pressure of soreness, dull pain in the chest.  Eating seemed to make it worse or bring it on  Took Tylenol and that seemed to help. Has a cript for nausea pills and some pepcid. Famotidine at night  Has a history of cholesectomy but had retained gallstones  States that the last time it was a few months ago had something similar  No personal heart history states her mother passed with CHF. I did see she has atherosclerosis on a past CT scan but no aneurysms noted  Past Medical History:  Diagnosis Date   Cholelithiasis with choledocholithiasis     Diverticulitis    Tobacco abuse    Varicose vein of leg    no problems now   Wears glasses     Past Surgical History:  Procedure Laterality Date   BILIARY DILATION  02/19/2020   Procedure: BILIARY DILATION;  Surgeon: Jackquline Denmark, MD;  Location: [facility] ENDOSCOPY;  Service: Endoscopy;;   CESAREAN SECTION     CHOLECYSTECTOMY N/A 05/23/2016   Procedure: LAPAROSCOPIC CHOLECYSTECTOMY WITH INTRAOPERATIVE CHOLANGIOGRAM;  Surgeon: Jackolyn Confer, MD;  Location: [place];  Service: General;  Laterality: N/A;   ENDOSCOPIC RETROGRADE CHOLANGIOPANCREATOGRAPHY (ERCP) WITH PROPOFOL N/A 06/06/2016   Procedure: ENDOSCOPIC RETROGRADE CHOLANGIOPANCREATOGRAPHY (ERCP) WITH PROPOFOL;  Surgeon: Clarene Essex, MD;  Location: [facility] ENDOSCOPY;  Service: Endoscopy;  Laterality: N/A;   ENDOSCOPIC RETROGRADE CHOLANGIOPANCREATOGRAPHY (ERCP) WITH PROPOFOL N/A 02/19/2020   Procedure: ENDOSCOPIC RETROGRADE CHOLANGIOPANCREATOGRAPHY (ERCP) WITH PROPOFOL;  Surgeon: Jackquline Denmark, MD;  Location: [facility] ENDOSCOPY;  Service: Endoscopy;  Laterality: N/A;   ERCP N/A 03/09/2016   Procedure: ENDOSCOPIC RETROGRADE CHOLANGIOPANCREATOGRAPHY (ERCP);  Surgeon: Clarene Essex, MD;  Location: [facility] ENDOSCOPY;  Service: Endoscopy;  Laterality: N/A;   ERCP N/A 06/12/2018   Procedure: ENDOSCOPIC RETROGRADE CHOLANGIOPANCREATOGRAPHY (ERCP);  Surgeon: Clarene Essex, MD;  Location: [facility] ENDOSCOPY;  Service: Endoscopy;  Laterality: N/A;   ESOPHAGOGASTRODUODENOSCOPY N/A 06/12/2018   Procedure: ESOPHAGOGASTRODUODENOSCOPY (EGD);  Surgeon: Clarene Essex, MD;  Location: [facility] ENDOSCOPY;  Service: Endoscopy;  Laterality: N/A;   laser vein surgery     last year   REMOVAL OF STONES  06/12/2018   Procedure: REMOVAL OF STONES;  Surgeon: Clarene Essex, MD;  Location: [facility] ENDOSCOPY;  Service: Endoscopy;;   REMOVAL OF STONES  02/19/2020   Procedure: REMOVAL OF STONES;  Surgeon: Jackquline Denmark, MD;  Location: [facility] ENDOSCOPY;  Service: Endoscopy;;   SPHINCTEROTOMY  06/12/2018   Procedure:  Joan Mayans;  Surgeon: Clarene Essex, MD;  Location: [facility] ENDOSCOPY;  Service: Endoscopy;;   SPHINCTEROTOMY  02/19/2020   Procedure: Joan Mayans;  Surgeon: Jackquline Denmark, MD;  Location: [facility] ENDOSCOPY;  Service: Endoscopy;;   TUBAL LIGATION     VARICOSE VEIN SURGERY Left     Family History  Problem Relation Age of Onset   Memory loss Mother    Ovarian cancer Mother    Colon polyps Neg Hx    Colon cancer Neg Hx     Social History   Socioeconomic History   Marital status: Married    Spouse name: Nathaneil Canary   Number of children: 4   Years of education: Not on file   Highest education level: Not on file  Occupational History   Not on file  Tobacco Use   Smoking status: Former    Packs/day: 0.50    Years: 35.00    Pack years: 17.50    Types: Cigarettes   Smokeless tobacco: Never   Tobacco comments:    No smoking since 02/2016- "wearing Nicotine patch"  Vaping Use   Vaping Use: Never used  Substance and Sexual Activity   Alcohol use: No    Alcohol/week: 0.0 standard drinks   Drug use: No   Sexual activity: Not Currently  Other Topics Concern   Not on file  Social History Narrative   Not on file   Social Determinants of Health   Financial Resource Strain: Low Risk    Difficulty of Paying Living Expenses: Not hard at all  Food Insecurity: No Food Insecurity   Worried About Charity fundraiser in the Last Year: Never true   Arboriculturist in the Last Year: Never true  Transportation Needs: No Transportation Needs   Lack of Transportation (Medical): No   Lack of Transportation (Non-Medical): No  Physical Activity: Sufficiently Active   Days of Exercise per Week: 7 days   Minutes of Exercise per Session: 30 min  Stress: No Stress Concern Present   Feeling of Stress : Only a little  Social Connections: Moderately Isolated   Frequency of Communication with Friends and Family: More than three times a week   Frequency of Social Gatherings with Friends and Family: Twice a  week   Attends Religious Services: Never   Marine scientist or Organizations: No   Attends Music therapist: Never   Marital Status: Married  Human resources officer Violence: Not At Risk   Fear of Current or Ex-Partner: No   Emotionally Abused: No   Physically Abused: No   Sexually Abused: No    Outpatient Medications Prior to Visit  Medication Sig Dispense Refill   acetaminophen (TYLENOL) 500 MG tablet Take 500 mg by mouth every 6 (six) hours as needed.     Calcium Carbonate-Vit D-Min (CALCIUM 1200 PO) Take by mouth.     cetirizine (ZYRTEC) 10 MG tablet Take 10 mg by mouth daily as needed for allergies.     famotidine (PEPCID) 20 MG tablet TAKE 1 TABLET BY MOUTH EVERYDAY AT BEDTIME 90 tablet 1  Multiple Vitamin (MULTIVITAMIN WITH MINERALS) TABS tablet Take 1 tablet by mouth daily with lunch.     omeprazole (PRILOSEC) 40 MG capsule TAKE 1 CAPSULE BY MOUTH EVERY DAY 90 capsule 0   Probiotic Product (PROBIOTIC PO) Take by mouth.     ursodiol (ACTIGALL) 250 MG tablet TAKE 1 TABLET IN THE MORNING AND AT BEDTIME. (MAY TAKE 1 TWICE A DAY OR 2 TAB(S) ONCE DAILY. 180 tablet 3   alendronate (FOSAMAX) 70 MG tablet Take 70 mg by mouth once a week. Take with a full glass of water on an empty stomach. (Patient not taking: Reported on 08/20/2021)     No facility-administered medications prior to visit.    Allergies  Allergen Reactions   Oxycodone Nausea And Vomiting and Other (See Comments)    Also made patient lightheaded and lethargic   Potassium Itching    Review of Systems  Constitutional:  Positive for chills (resloved). Negative for fatigue and fever.  HENT:  Negative for congestion, ear discharge, ear pain, sinus pressure and sinus pain.   Respiratory:  Negative for cough and shortness of breath.   Cardiovascular:  Positive for chest pain.  Gastrointestinal:  Positive for abdominal pain and nausea. Negative for diarrhea and vomiting.  Genitourinary:  Negative for  dysuria, frequency and hematuria.  Neurological:  Positive for headaches. Negative for dizziness and light-headedness.      Objective:    Physical Exam Vitals and nursing note reviewed.  Constitutional:      Appearance: Normal appearance.  HENT:     Right Ear: Tympanic membrane, ear canal and external ear normal. There is no impacted cerumen.     Left Ear: Tympanic membrane, ear canal and external ear normal. There is no impacted cerumen.     Mouth/Throat:     Mouth: Mucous membranes are moist.     Pharynx: Oropharynx is clear. No posterior oropharyngeal erythema.  Eyes:     Extraocular Movements: Extraocular movements intact.     Pupils: Pupils are equal, round, and reactive to light.  Neck:     Thyroid: No thyroid mass, thyromegaly or thyroid tenderness.  Cardiovascular:     Rate and Rhythm: Normal rate and regular rhythm.     Pulses: Normal pulses.     Heart sounds: Normal heart sounds.  Pulmonary:     Effort: Pulmonary effort is normal.     Breath sounds: Normal breath sounds.  Chest:     Chest wall: No tenderness.  Abdominal:     General: Bowel sounds are normal. There is no distension.     Palpations: There is no mass.     Tenderness: There is no abdominal tenderness.  Musculoskeletal:     Right lower leg: No edema.     Left lower leg: No edema.  Lymphadenopathy:     Cervical: No cervical adenopathy.  Skin:    General: Skin is warm.  Neurological:     General: No focal deficit present.     Mental Status: She is alert.     Sensory: No sensory deficit.     Motor: No weakness.     Coordination: Finger-Nose-Finger Test normal.     Gait: Gait normal.     Deep Tendon Reflexes:     Reflex Scores:      Bicep reflexes are 2+ on the right side and 2+ on the left side.      Patellar reflexes are 2+ on the left side.      Achilles reflexes are  2+ on the right side.    Comments: Bilateral upper and lower strength extremity 5/5  Psychiatric:        Mood and Affect: Mood  normal.        Behavior: Behavior normal.        Thought Content: Thought content normal.        Judgment: Judgment normal.    BP 94/60 (BP Location: Left Arm, Patient Position: Sitting, Cuff Size: Normal)    Pulse 85    Temp 97.8 F (36.6 C)    Ht 5\' 1"  (1.549 m) Comment: per pt   Wt 127 lb (57.6 kg)    SpO2 97%    BMI 24.00 kg/m  Wt Readings from Last 3 Encounters:  08/20/21 127 lb (57.6 kg)  06/22/21 127 lb (57.6 kg)  04/06/21 124 lb (56.2 kg)    Health Maintenance Due  Topic Date Due   COVID-19 Vaccine (5 - Booster for Pfizer series) 03/07/2021    There are no preventive care reminders to display for this patient.   Lab Results  Component Value Date   TSH 0.764 02/18/2020   Lab Results  Component Value Date   WBC 6.2 03/03/2021   HGB 13.1 03/03/2021   HCT 38.1 03/03/2021   MCV 83.5 03/03/2021   PLT 192.0 03/03/2021   Lab Results  Component Value Date   NA 137 03/03/2021   K 3.7 03/03/2021   CO2 25 03/03/2021   GLUCOSE 115 (H) 03/03/2021   BUN 10 03/03/2021   CREATININE 0.78 03/03/2021   BILITOT 0.4 03/18/2021   ALKPHOS 80 03/18/2021   AST 21 03/18/2021   ALT 22 03/18/2021   PROT 6.8 03/18/2021   ALBUMIN 4.1 03/18/2021   CALCIUM 8.3 (L) 03/03/2021   ANIONGAP 8 02/25/2020   GFR 78.88 03/03/2021   Lab Results  Component Value Date   CHOL 248 (H) 11/18/2020   Lab Results  Component Value Date   HDL 65.00 11/18/2020   Lab Results  Component Value Date   LDLCALC 158 (H) 11/18/2020   Lab Results  Component Value Date   TRIG 122.0 11/18/2020   Lab Results  Component Value Date   CHOLHDL 4 11/18/2020   Lab Results  Component Value Date   HGBA1C 5.5 03/04/2016       Assessment & Plan:   Problem List Items Addressed This Visit       Digestive   Nausea and vomiting    She is having intermittent nausea and vomiting.  She does have a prescription of ondansetron 4 mg ODT from previous study that she can use told her to continue her omeprazole  she does take famotidine 20 mg at night told her she can take that twice a day.  ED precautions reviewed pending lab results and x-ray results. COVID was negative in office.      Relevant Orders   CBC   Lipase   Comprehensive metabolic panel   POC [id number] (Completed)     Other   Atypical chest pain - Primary    She has no cardiac history.  Does have family history of CHF in mother.  Did all CT scan she does have aortic atherosclerosis.  Patient's EKG within normal limits in office.  Did Review strict signs and symptoms when to be seen emergently.  Patient acknowledged.  Will obtain basic labs and chest x-ray today      Relevant Orders   EKG 12-Lead (Completed)   CBC   Lipase  Comprehensive metabolic panel   DG Chest 2 View     No orders of the defined types were placed in this encounter.  This visit occurred during the SARS-CoV-2 public health emergency.  Safety protocols were in place, including screening questions prior to the visit, additional usage of staff PPE, and extensive cleaning of exam room while observing appropriate contact time as indicated for disinfecting solutions.   Romilda Garret, NP

## 2021-08-20 NOTE — Patient Instructions (Signed)
I will be in touch with the labs when they result along with the xray. Take the nausea medication if you need it Also take the famotidine if you feel the acid rolling up it is ok to take with the omeprazole.  If the pain changes in nature or gets worse go be seen. If you continue to vomit or become altered, get short of breath, or become week go to the emergency room  Covid test was negative

## 2021-08-20 NOTE — Assessment & Plan Note (Signed)
She has no cardiac history.  Does have family history of CHF in mother.  Did all CT scan she does have aortic atherosclerosis.  Patient's EKG within normal limits in office.  Did Review strict signs and symptoms when to be seen emergently.  Patient acknowledged.  Will obtain basic labs and chest x-ray today

## 2021-08-20 NOTE — Assessment & Plan Note (Signed)
She is having intermittent nausea and vomiting.  She does have a prescription of ondansetron 4 mg ODT from previous study that she can use told her to continue her omeprazole she does take famotidine 20 mg at night told her she can take that twice a day.  ED precautions reviewed pending lab results and x-ray results. COVID was negative in office.

## 2021-08-21 LAB — COMPREHENSIVE METABOLIC PANEL
AG Ratio: 1.3 (calc) (ref 1.0–2.5)
ALT: 30 U/L — ABNORMAL HIGH (ref 6–29)
AST: 23 U/L (ref 10–35)
Albumin: 3.9 g/dL (ref 3.6–5.1)
Alkaline phosphatase (APISO): 78 U/L (ref 37–153)
BUN: 15 mg/dL (ref 7–25)
CO2: 26 mmol/L (ref 20–32)
Calcium: 9.2 mg/dL (ref 8.6–10.4)
Chloride: 100 mmol/L (ref 98–110)
Creat: 0.86 mg/dL (ref 0.50–1.05)
Globulin: 2.9 g/dL (calc) (ref 1.9–3.7)
Glucose, Bld: 96 mg/dL (ref 65–99)
Potassium: 4.1 mmol/L (ref 3.5–5.3)
Sodium: 139 mmol/L (ref 135–146)
Total Bilirubin: 0.7 mg/dL (ref 0.2–1.2)
Total Protein: 6.8 g/dL (ref 6.1–8.1)

## 2021-08-21 LAB — LIPASE: Lipase: 8 U/L (ref 7–60)

## 2021-08-21 LAB — CBC
HCT: 36.7 % (ref 35.0–45.0)
Hemoglobin: 12.5 g/dL (ref 11.7–15.5)
MCH: 29.3 pg (ref 27.0–33.0)
MCHC: 34.1 g/dL (ref 32.0–36.0)
MCV: 85.9 fL (ref 80.0–100.0)
MPV: 11.4 fL (ref 7.5–12.5)
Platelets: 189 10*3/uL (ref 140–400)
RBC: 4.27 10*6/uL (ref 3.80–5.10)
RDW: 12.7 % (ref 11.0–15.0)
WBC: 7 10*3/uL (ref 3.8–10.8)

## 2021-08-23 ENCOUNTER — Encounter: Payer: Self-pay | Admitting: Nurse Practitioner

## 2021-08-26 DIAGNOSIS — M25512 Pain in left shoulder: Secondary | ICD-10-CM | POA: Diagnosis not present

## 2021-08-30 ENCOUNTER — Encounter: Payer: Self-pay | Admitting: Gastroenterology

## 2021-08-30 ENCOUNTER — Encounter: Payer: Self-pay | Admitting: Nurse Practitioner

## 2021-08-30 DIAGNOSIS — K219 Gastro-esophageal reflux disease without esophagitis: Secondary | ICD-10-CM

## 2021-08-30 DIAGNOSIS — R0789 Other chest pain: Secondary | ICD-10-CM

## 2021-08-30 DIAGNOSIS — R079 Chest pain, unspecified: Secondary | ICD-10-CM

## 2021-08-30 DIAGNOSIS — K8050 Calculus of bile duct without cholangitis or cholecystitis without obstruction: Secondary | ICD-10-CM

## 2021-08-31 ENCOUNTER — Other Ambulatory Visit: Payer: Self-pay | Admitting: Gastroenterology

## 2021-08-31 DIAGNOSIS — R1013 Epigastric pain: Secondary | ICD-10-CM

## 2021-08-31 NOTE — Telephone Encounter (Signed)
I was able to review the recent work-up.  Normal liver enzymes, renal function, CBC, lipase.  CXR was unrevealing.  Negative COVID test.  Not entirely sure with the etiology is for the episodic symptoms.  Is she still taking the ursodiol?  Have symptoms again resolved?  If no ongoing symptoms, not sure that additional imaging right now would be beneficial.  Could consider endoscopy to evaluate for gastritis, PUD, LES laxity, the latter of which could also be related to bile acid reflux s/p ccy.  Otherwise, can schedule OV.

## 2021-09-10 NOTE — Telephone Encounter (Signed)
Glad to hear she was feeling better again yesterday. Could be bile acid reflux, which can be hard to prove, and a potential reason why acid suppression meds arent as effective. The episodic nature also makes it a diagnostic/treatment challenge, since using meds like Baclofen are daily rather than prn. Plan to keep f/u appt w me as scheduled. We will discuss the role of alternate meds (sometimes rabeprazole works better than other PPIs in certain individuals), will discuss role/utility of baclofen, and/or repeat EGD with Bravo placement or referral for EM and pH/Impedance testing. Can trial FD Guard in the interim and see if this cuts down on sxs frequency and severity. Thanks.

## 2021-09-14 NOTE — Telephone Encounter (Signed)
Again, the episodic nature somewhat limits the effectiveness and sensitivity of diagnostic options. However, as she has missed work due to [employer], can plan for the following: - CT chest and abdomen with contrast to rule out extraluminal pathology - Still do not think baclofen is a great option given episodic nature, but will keep this option available - Peppermint oil (or peppermint altoids) during an episode to see if this limits sxs - Esophageal manometry and pH/Impedance testing to be done ON PPI.  - f/u in clinic as scheduled

## 2021-09-14 NOTE — Telephone Encounter (Signed)
CT orders in epic. Secure staff message sent to radiology scheduling to contact pt to set up appt.  My chart message sent to patient with recommendations. Will enter orders for esophageal manometry with 24 hour pH if patient would like to proceed.

## 2021-09-22 ENCOUNTER — Ambulatory Visit (HOSPITAL_COMMUNITY)
Admission: RE | Admit: 2021-09-22 | Discharge: 2021-09-22 | Disposition: A | Discharge status: Home / Self Care | Payer: Medicare HMO | Source: Ambulatory Visit | Attending: Gastroenterology | Admitting: Gastroenterology

## 2021-09-22 ENCOUNTER — Ambulatory Visit: Payer: Medicare HMO | Admitting: Gastroenterology

## 2021-09-22 ENCOUNTER — Other Ambulatory Visit: Payer: Self-pay

## 2021-09-22 ENCOUNTER — Encounter: Payer: Self-pay | Admitting: Gastroenterology

## 2021-09-22 VITALS — BP 106/60 | HR 76 | Ht 61.0 in | Wt 123.4 lb

## 2021-09-22 DIAGNOSIS — K838 Other specified diseases of biliary tract: Secondary | ICD-10-CM | POA: Diagnosis not present

## 2021-09-22 DIAGNOSIS — K8050 Calculus of bile duct without cholangitis or cholecystitis without obstruction: Secondary | ICD-10-CM

## 2021-09-22 DIAGNOSIS — Z86010 Personal history of colonic polyps: Secondary | ICD-10-CM

## 2021-09-22 DIAGNOSIS — R079 Chest pain, unspecified: Secondary | ICD-10-CM | POA: Insufficient documentation

## 2021-09-22 DIAGNOSIS — I700 Atherosclerosis of aorta: Secondary | ICD-10-CM | POA: Diagnosis not present

## 2021-09-22 DIAGNOSIS — R112 Nausea with vomiting, unspecified: Secondary | ICD-10-CM

## 2021-09-22 DIAGNOSIS — R0989 Other specified symptoms and signs involving the circulatory and respiratory systems: Secondary | ICD-10-CM

## 2021-09-22 DIAGNOSIS — R0789 Other chest pain: Secondary | ICD-10-CM | POA: Insufficient documentation

## 2021-09-22 DIAGNOSIS — K219 Gastro-esophageal reflux disease without esophagitis: Secondary | ICD-10-CM | POA: Diagnosis not present

## 2021-09-22 DIAGNOSIS — R1013 Epigastric pain: Secondary | ICD-10-CM | POA: Diagnosis not present

## 2021-09-22 LAB — POCT I-STAT CREATININE: Creatinine, Ser: 0.9 mg/dL (ref 0.44–1.00)

## 2021-09-22 MED ORDER — VENLAFAXINE HCL ER 37.5 MG PO CP24: 37.5000 mg | ORAL_CAPSULE | Freq: Every day | ORAL | 0 refills | Status: DC

## 2021-09-22 MED ORDER — IOHEXOL 300 MG/ML  SOLN
100.0000 mL | Freq: Once | INTRAMUSCULAR | Status: AC | PRN
  Administered 2021-09-22: 100 mL via INTRAVENOUS

## 2021-09-22 MED ORDER — SODIUM CHLORIDE (PF) 0.9 % IJ SOLN
INTRAMUSCULAR | Status: AC
  Filled 2021-09-22: qty 50

## 2021-09-22 NOTE — Progress Notes (Signed)
Chief Complaint:    Nausea/Vomiting, GERD, atypical chest pain  GI History: 55 old female follows in the GI clinic for the following:   1) GERD: Index symptoms of dyspepsia, globus sensation.  No heartburn or regurgitation.  No dysphagia.  Well-controlled with Prilosec 40 mg/day. Possible bile acid reflux overlap.   2) Choledocholithiasis: History of cholelithiasis s/p ccy with IOC in 05/2016 with subsequent ERCP in 05/2018.  Readmitted 01/2020 with choledocholithiasis, underwent repeat ERCP with stone extraction.  Complicated by readmission 02/23/2020 with HCAP, pleural effusion treated with ABX and thoracentesis.  Started ursodiol for stone dissolution/choledocholithiasis prevention   3) History of colon polyps: Due for repeat colonoscopy in 2023 for ongoing polyp surveillance   Endoscopic history: -ERCP (02/2016, Dr. Watt Climes, [place]): Choledocholithiasis, removed with sphincterotomy and balloon sweeps -ERCP (05/2016, Dr. Watt Climes): Choledocholithiasis, removed with further sphincterotomy and balloon sweeps with removal of stones and sludge -EGD (05/2018, Dr. Watt Climes): Normal -ERCP (05/2018, Dr. Watt Climes): No stones on cholangiogram.  Balloon sweeps (nothing removed) and dilation of distal CBD/ampulla with 4 cm x 8 mm balloon dilator to facilitate improved drainage -Colonoscopy (09/2018, Dr. Watt Climes): External/internal hemorrhoids, sigmoid diverticulosis, 7 polyps resected (no path review), normal TI.  Recommended repeat in 3 years -ERCP (01/2020, Dr. Lyndel Safe): Removal of multiple stones and pus.  Normal post occlusion cholangiogram   -Abdominal ultrasound (05/2018): Surgically absent GB, CBD 10.1 mm tapering to 7.1 mm.  Increased hepatic echotexture  HPI:     Patient is a 66 y.o. female presenting to the Gastroenterology Clinic for follow-up.  Last seen by me on 04/06/2021.  Was doing well at that time and without any GI symptoms.  Reflux was well controlled with omeprazole.  Since last appointment, has had  multiple episodes of nausea/vomiting and "feeling like I have something in my throat".  Starts as "feeling a little cold" then globus sensation, followed by pain in the mid chest to epigastrium, followed by nausea/vomiting.  Sxs occur at random. Not a/w PO intake. Can have sxs prior to bed, but does not wake her from sleep.  Always "feels something in my chest" even in between episodes.  Has had to call out of work a couple of times due to symptoms.  Was seen by [facility] for this issue on 08/20/2021; Normal liver enzymes, renal function, CBC, lipase.  CXR was unrevealing.  Negative COVID test.  Normal EKG in office.  Prescribed Zofran 4 mg ODT and added famotidine 20 mg qhs.  Symptoms seem to be more frequent and intense when they occur.  She does note husband going through chemotherapy again which is certainly a great stressor for her.   Started probiotic. Recommended Fdgard and/or peppermint oil (or peppermint Altoids) during episodes; has not tried yet.   Order CT chest/abdomen; completed earlier today with read pending.    Stopped her Fosamax.     Review of systems:     No chest pain, no SOB, no fevers, no urinary sx   Past Medical History:  Diagnosis Date   Cholelithiasis with choledocholithiasis    Diverticulitis    Tobacco abuse    Varicose vein of leg    no problems now   Wears glasses     Patient's surgical history, family medical history, social history, medications and allergies were all reviewed in Epic    Current Outpatient Medications  Medication Sig Dispense Refill   acetaminophen (TYLENOL) 500 MG tablet Take 500 mg by mouth every 6 (six) hours as needed.  alendronate (FOSAMAX) 70 MG tablet Take 70 mg by mouth once a week. Take with a full glass of water on an empty stomach. (Patient not taking: Reported on 08/20/2021)     Calcium Carbonate-Vit D-Min (CALCIUM 1200 PO) Take by mouth.     cetirizine (ZYRTEC) 10 MG tablet Take 10 mg by mouth daily as needed for allergies.      famotidine (PEPCID) 20 MG tablet TAKE 1 TABLET BY MOUTH EVERYDAY AT BEDTIME 90 tablet 1   Multiple Vitamin (MULTIVITAMIN WITH MINERALS) TABS tablet Take 1 tablet by mouth daily with lunch.     omeprazole (PRILOSEC) 40 MG capsule TAKE 1 CAPSULE BY MOUTH EVERY DAY 90 capsule 2   Probiotic Product (PROBIOTIC PO) Take by mouth.     ursodiol (ACTIGALL) 250 MG tablet TAKE 1 TABLET IN THE MORNING AND AT BEDTIME. (MAY TAKE 1 TWICE A DAY OR 2 TAB(S) ONCE DAILY. 180 tablet 3   No current facility-administered medications for this visit.   Facility-Administered Medications Ordered in Other Visits  Medication Dose Route Frequency Provider Last Rate Last Admin   sodium chloride (PF) 0.9 % injection             Physical Exam:     There were no vitals taken for this visit.  GENERAL:  Pleasant female in NAD PSYCH: : Cooperative, normal affect Musculoskeletal:  Normal muscle tone, normal strength NEURO: Alert and oriented x 3, no focal neurologic deficits   IMPRESSION and PLAN:    1) GERD 2) Nausea/Vomiting 3) Atypical chest pain 4) Globus sensation  We discussed the DDx for presenting symptoms today.  Previously curious about bile acid reflux, but the more she described her symptoms and seeming relationship with increased stressors, could be more a component of esophageal hypersensitivity.  Discussed how the episodic nature makes it tough to diagnose and treat, but options would include 1) changing to rabeprazole, 2) trial of baclofen, 3) repeat EGD with Bravo placement (on PPI), 4) esophageal manometry and pH/impedance testing (on PPI), 5) trial of SNRI  After careful consideration, she is strongly opposed to baclofen and does not want to do EM/pH/impedance.  I am not sure that Bravo is sensitive enough to capture hypersensitivity given lack of impedance data.  Ultimately decided on the following:  - Trial of venlafaxine 37.5 mg qhs.  If suboptimal response after 4-7 days, increase to 75 mg  qhs.  Patient will call the office next week to let me know how she is doing - Continue omeprazole - Continue Pepcid for now - Continue monitoring symptoms - Can trial peppermint oil prn during symptoms - Follow-up CT that was done earlier today - If suboptimal response and CT unrevealing, will again discuss esophageal manometry and pH/impedance testing (on PPI) - If work-up unrevealing, plan for referral to Cardiology for atypical CP  5) History of choledocholithiasis - No recurrence since ERCP in 01/2020 and starting ursodiol in 02/2020 - Continue ursodiol for stone dissolution/choledocholithiasis prevention  6) History of colon polyps - Due for repeat colonoscopy in 2023 for ongoing polyp surveillance.  Will address appropriate timing at follow-up  RTC in 3 months or sooner prn     I spent 40 minutes of time, including in depth chart review, independent review of results as outlined above, communicating results with the patient directly, face-to-face time with the patient, coordinating care, ordering studies and medications as appropriate, and documentation.       Lavena Bullion ,DO, FACG 09/22/2021, 3:49 PM

## 2021-09-22 NOTE — Patient Instructions (Signed)
If you are age 68 or older, your body mass index should be between 23-30. Your Body mass index is 23.31 kg/m. If this is out of the aforementioned range listed, please consider follow up with your Primary Care Provider.  __________________________________________________________  The [facility] providers would like to encourage you to use [facility] to communicate with providers for non-urgent requests or questions.  Due to long hold times on the telephone, sending your provider a message by [facility] may be a faster and more efficient way to get a response.  Please allow 48 business hours for a response.  Please remember that this is for non-urgent requests.    Due to recent changes in healthcare laws, you may see the results of your imaging and laboratory studies on MyChart before your provider has had a chance to review them.  We understand that in some cases there may be results that are confusing or concerning to you. Not all laboratory results come back in the same time frame and the provider may be waiting for multiple results in order to interpret others.  Please give [place] 48 hours in order for your provider to thoroughly review all the results before contacting the office for clarification of your results.    We have sent the following medications to your pharmacy for you to pick up at your convenience: Venlafaxine  Call us in 5 days to let us know how you feeling on Venlafaxine, if everything is well we will titrate medication up.  Thank you for choosing me and [facility].  Vito Cirigliano, D.O.

## 2021-09-23 DIAGNOSIS — M25512 Pain in left shoulder: Secondary | ICD-10-CM | POA: Diagnosis not present

## 2021-09-24 ENCOUNTER — Telehealth: Payer: Self-pay | Admitting: General Surgery

## 2021-09-24 NOTE — Telephone Encounter (Signed)
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  Recommend follow-up with PCM to determine if additional work-up Cardiology referral is needed. 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  There is potential narrowing of the CBD which could be indicative of chronic bile duct stricture.  CBD is normal distal to that area.  Recommendations: -As above, follow-up with PCM to discuss atherosclerotic disease on CT -MRCP to evaluate for chronic CBD stricture.  Unclear to what degree that could be playing a role in Jasmine Bradford symptoms as Jasmine Bradford liver enzymes were otherwise normal

## 2021-09-24 NOTE — Telephone Encounter (Signed)
Left the patient a voicemail that I would call her back on Monday, and I would send her a mychart message as well.

## 2021-09-27 ENCOUNTER — Encounter: Payer: Self-pay | Admitting: Nurse Practitioner

## 2021-09-27 ENCOUNTER — Telehealth: Payer: Self-pay | Admitting: General Surgery

## 2021-09-27 DIAGNOSIS — K831 Obstruction of bile duct: Secondary | ICD-10-CM

## 2021-09-27 NOTE — Telephone Encounter (Signed)
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  Recommend follow-up with PCM to determine if additional work-up Cardiology referral is needed. 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  There is potential narrowing of the CBD which could be indicative of chronic bile duct stricture.  CBD is normal distal to that area.  Recommendations: -As above, follow-up with PCM to discuss atherosclerotic disease on CT -MRCP to evaluate for chronic CBD stricture.  Unclear to what degree that could be playing a role in her symptoms as her liver enzymes were otherwise normal

## 2021-09-27 NOTE — Telephone Encounter (Signed)
Will have central scheduling call pt to schedule

## 2021-10-06 ENCOUNTER — Encounter: Payer: Self-pay | Admitting: Nurse Practitioner

## 2021-10-06 ENCOUNTER — Other Ambulatory Visit: Payer: Self-pay

## 2021-10-06 ENCOUNTER — Ambulatory Visit (INDEPENDENT_AMBULATORY_CARE_PROVIDER_SITE_OTHER): Payer: Medicare HMO | Admitting: Nurse Practitioner

## 2021-10-06 VITALS — BP 110/70 | HR 72 | Temp 97.0°F | Ht 62.0 in | Wt 121.8 lb

## 2021-10-06 DIAGNOSIS — E782 Mixed hyperlipidemia: Secondary | ICD-10-CM

## 2021-10-06 DIAGNOSIS — I700 Atherosclerosis of aorta: Secondary | ICD-10-CM

## 2021-10-06 DIAGNOSIS — F418 Other specified anxiety disorders: Secondary | ICD-10-CM

## 2021-10-06 HISTORY — DX: Mixed hyperlipidemia: E78.2

## 2021-10-06 MED ORDER — ALPRAZOLAM 0.25 MG PO TABS: 0.2500 mg | ORAL_TABLET | Freq: Every day | ORAL | 0 refills | Status: DC | PRN

## 2021-10-06 MED ORDER — PRAVASTATIN SODIUM 20 MG PO TABS: 20.0000 mg | ORAL_TABLET | Freq: Every day | ORAL | 5 refills | Status: DC

## 2021-10-06 NOTE — Patient Instructions (Signed)
Start pravastatin Continue DASH diet  Preventing High Cholesterol Cholesterol is a white, waxy substance similar to fat that the human body needs to help build cells. The liver makes all the cholesterol that a person's body needs. Having high cholesterol (hypercholesterolemia) increases your risk for heart disease and stroke. Extra or excess cholesterol comes from the food that you eat. High cholesterol can often be prevented with diet and lifestyle changes. If you already have high cholesterol, you can control it with diet, lifestyle changes, and medicines. How can high cholesterol affect me? If you have high cholesterol, fatty deposits (plaques) may build up on the walls of your blood vessels. The blood vessels that carry blood away from your heart are called arteries. Plaques make the arteries narrower and stiffer. This in turn can: Restrict or block blood flow and cause blood clots to form. Increase your risk for heart attack and stroke. What can increase my risk for high cholesterol? This condition is more likely to develop in people who: Eat foods that are high in saturated fat or cholesterol. Saturated fat is mostly found in foods that come from animal sources. Are overweight. Are not getting enough exercise. Use products that contain nicotine or tobacco, such as cigarettes, e-cigarettes, and chewing tobacco. Have a family history of high cholesterol (familial hypercholesterolemia). What actions can I take to prevent this? Nutrition  Eat less saturated fat. Avoid trans fats (partially hydrogenated oils). These are often found in margarine and in some baked goods, fried foods, and snacks bought in packages. Avoid precooked or cured meat, such as bacon, sausages, or meat loaves. Avoid foods and drinks that have added sugars. Eat more fruits, vegetables, and whole grains. Choose healthy sources of protein, such as fish, poultry, lean cuts of red meat, beans, peas, lentils, and  nuts. Choose healthy sources of fat, such as: Nuts. Vegetable oils, especially olive oil. Fish that have healthy fats, such as omega-3 fatty acids. These fish include mackerel or salmon. Lifestyle Lose weight if you are overweight. Maintaining a healthy body mass index (BMI) can help prevent or control high cholesterol. It can also lower your risk for diabetes and high blood pressure. Ask your health care provider to help you with a diet and exercise plan to lose weight safely. Do not use any products that contain nicotine or tobacco. These products include cigarettes, chewing tobacco, and vaping devices, such as e-cigarettes. If you need help quitting, ask your health care provider. Alcohol use Do not drink alcohol if: Your health care provider tells you not to drink. You are pregnant, may be pregnant, or are planning to become pregnant. If you drink alcohol: Limit how much you have to: 0-1 drink a day for women. 0-2 drinks a day for men. Know how much alcohol is in your drink. In the [place] one drink equals one 12 oz bottle of beer (355 mL), one 5 oz glass of wine (148 mL), or one 1 oz glass of hard liquor (44 mL). Activity  Get enough exercise. Do exercises as told by your health care provider. Each week, do at least 150 minutes of exercise that takes a medium level of effort (moderate-intensity exercise). This kind of exercise: Makes your heart beat faster while allowing you to still be able to talk. Can be done in short sessions several times a day or longer sessions a few times a week. For example, on 5 days each week, you could walk fast or ride your bike 3 times a day for  10 minutes each time. Medicines Your health care provider may recommend medicines to help lower cholesterol. This may be a medicine to lower the amount of cholesterol that your liver makes. You may need medicine if: Diet and lifestyle changes have not lowered your cholesterol enough. You have high cholesterol and  other risk factors for heart disease or stroke. Take over-the-counter and prescription medicines only as told by your health care provider. General information Manage your risk factors for high cholesterol. Talk with your health care provider about all your risk factors and how to lower your risk. Manage other conditions that you have, such as diabetes or high blood pressure (hypertension). Have blood tests to check your cholesterol levels at regular points in time as told by your health care provider. Keep all follow-up visits. This is important. Where to find more information American Heart Association: www.heart.org National Heart, Lung, and Blood Institute: https://wilson-eaton.com/ Summary High cholesterol increases your risk for heart disease and stroke. By keeping your cholesterol level low, you can reduce your risk for these conditions. High cholesterol can often be prevented with diet and lifestyle changes. Work with your health care provider to manage your risk factors, and have your blood tested regularly. This information is not intended to replace advice given to you by your health care provider. Make sure you discuss any questions you have with your health care provider. Document Revised: 10/12/2020 Document Reviewed: 10/12/2020 Elsevier Patient Education  2022 [employer].

## 2021-10-06 NOTE — Assessment & Plan Note (Signed)
Per CT chest completed 09/2021: Aortic atherosclerosis, in addition to two vessel coronary artery disease. Please note that although the presence of coronary artery calcium documents the presence of coronary artery disease, theseverity of this disease and any potential stenosis cannot be assessed on this non-gated CT examination. Assessment for potential risk factor modification, dietary therapy or pharmacologic therapy may be warranted, if clinically indicated.  We discussed her risk for CAD/CVA, pros and cons of statin drug, DASH diet and exercise. Calculated ASCVD risk at 5.3%. She agreed to start pravastatin 20mg . Repeat lipid panel in 71month.

## 2021-10-06 NOTE — Progress Notes (Signed)
Subjective:  Patient ID: Jasmine Bradford, female    DOB: April 16, 1956  Age: 66 y.o. MRN: [id number]  CC: Follow-up (Pt is here to follow up on recent CT results and discuss treatment options. )  HPI  Atherosclerosis of aorta [facility]) Per CT chest completed 09/2021: Aortic atherosclerosis, in addition to two vessel coronary artery disease. Please note that although the presence of coronary artery calcium documents the presence of coronary artery disease, theseverity of this disease and any potential stenosis cannot be assessed on this non-gated CT examination. Assessment for potential risk factor modification, dietary therapy or pharmacologic therapy may be warranted, if clinically indicated.  We discussed her risk for CAD/CVA, pros and cons of statin drug, DASH diet and exercise. Calculated ASCVD risk at 5.3%. She agreed to start pravastatin 20mg . Repeat lipid panel in 37month.  She is waiting for MRI appt. She is concerned about increase anxiety during MRI scan. She agreed to alprazolam Rx.  Reviewed past Medical, Social and Family history today.  Outpatient Medications Prior to Visit  Medication Sig Dispense Refill   acetaminophen (TYLENOL) 500 MG tablet Take 500 mg by mouth every 6 (six) hours as needed.     Calcium Carbonate-Vit D-Min (CALCIUM 1200 PO) Take by mouth.     cetirizine (ZYRTEC) 10 MG tablet Take 10 mg by mouth daily as needed for allergies.     famotidine (PEPCID) 20 MG tablet TAKE 1 TABLET BY MOUTH EVERYDAY AT BEDTIME 90 tablet 1   Multiple Vitamin (MULTIVITAMIN WITH MINERALS) TABS tablet Take 1 tablet by mouth daily with lunch.     omeprazole (PRILOSEC) 40 MG capsule TAKE 1 CAPSULE BY MOUTH EVERY DAY 90 capsule 2   Probiotic Product (PROBIOTIC PO) Take by mouth.     ursodiol (ACTIGALL) 250 MG tablet TAKE 1 TABLET IN THE MORNING AND AT BEDTIME. (MAY TAKE 1 TWICE A DAY OR 2 TAB(S) ONCE DAILY. 180 tablet 3   alendronate (FOSAMAX) 70 MG tablet Take 70 mg by mouth once a  week. Take with a full glass of water on an empty stomach. (Patient not taking: Reported on 09/22/2021)     venlafaxine XR (EFFEXOR XR) 37.5 MG 24 hr capsule Take 1 capsule (37.5 mg total) by mouth at bedtime. (Patient not taking: Reported on 10/06/2021) 30 capsule 0   No facility-administered medications prior to visit.    ROS See HPI  Objective:  BP 110/70 (BP Location: Left Arm, Patient Position: Sitting, Cuff Size: Normal)    Pulse 72    Temp (!) 97 F (36.1 C) (Temporal)    Ht 5\' 2"  (1.575 m)    Wt 121 lb 12.8 oz (55.2 kg)    SpO2 98%    BMI 22.28 kg/m   Physical Exam Vitals reviewed.  Neurological:     Mental Status: She is alert and oriented to person, place, and time.   Assessment & Plan:  This visit occurred during the SARS-CoV-2 public health emergency.  Safety protocols were in place, including screening questions prior to the visit, additional usage of staff PPE, and extensive cleaning of exam room while observing appropriate contact time as indicated for disinfecting solutions.   Roby was seen today for follow-up.  Diagnoses and all orders for this visit:  Atherosclerosis of aorta (HCC) -     pravastatin (PRAVACHOL) 20 MG tablet; Take 1 tablet (20 mg total) by mouth at bedtime.  Situational anxiety -     ALPRAZolam (XANAX) 0.25 MG tablet; Take 1 tablet (0.25  mg total) by mouth daily as needed for anxiety.  Mixed hyperlipidemia -     pravastatin (PRAVACHOL) 20 MG tablet; Take 1 tablet (20 mg total) by mouth at bedtime.   Problem List Items Addressed This Visit       Cardiovascular and Mediastinum   Atherosclerosis of aorta [facility]) - Primary    Per CT chest completed 09/2021: Aortic atherosclerosis, in addition to two vessel coronary artery disease. Please note that although the presence of coronary artery calcium documents the presence of coronary artery disease, theseverity of this disease and any potential stenosis cannot be assessed on this non-gated CT  examination. Assessment for potential risk factor modification, dietary therapy or pharmacologic therapy may be warranted, if clinically indicated.  We discussed her risk for CAD/CVA, pros and cons of statin drug, DASH diet and exercise. Calculated ASCVD risk at 5.3%. She agreed to start pravastatin 20mg . Repeat lipid panel in 26month.      Relevant Medications   pravastatin (PRAVACHOL) 20 MG tablet     Other   Mixed hyperlipidemia   Relevant Medications   pravastatin (PRAVACHOL) 20 MG tablet   Other Visit Diagnoses     Situational anxiety       Relevant Medications   ALPRAZolam (XANAX) 0.25 MG tablet       I have spent 47mins with this patient regarding history taking, documentation, review of labs, radiology, GI-specialty note, formulating plan and discussing treatment options with patient.  Follow-up: Return for maintain upcoming appt.  Wilfred Lacy, NP

## 2021-10-14 ENCOUNTER — Emergency Department (HOSPITAL_COMMUNITY): Payer: Medicare HMO

## 2021-10-14 ENCOUNTER — Inpatient Hospital Stay (HOSPITAL_COMMUNITY)
Admission: EM | Admit: 2021-10-14 | Discharge: 2021-10-16 | DRG: 446 | Disposition: A | Discharge status: Home / Self Care | Payer: Medicare HMO | Attending: Internal Medicine | Admitting: Internal Medicine

## 2021-10-14 ENCOUNTER — Other Ambulatory Visit: Payer: Self-pay

## 2021-10-14 ENCOUNTER — Encounter (HOSPITAL_COMMUNITY): Payer: Self-pay | Admitting: Emergency Medicine

## 2021-10-14 ENCOUNTER — Observation Stay (HOSPITAL_COMMUNITY): Payer: Medicare HMO

## 2021-10-14 DIAGNOSIS — J439 Emphysema, unspecified: Secondary | ICD-10-CM | POA: Diagnosis not present

## 2021-10-14 DIAGNOSIS — K8051 Calculus of bile duct without cholangitis or cholecystitis with obstruction: Secondary | ICD-10-CM | POA: Diagnosis not present

## 2021-10-14 DIAGNOSIS — R112 Nausea with vomiting, unspecified: Secondary | ICD-10-CM

## 2021-10-14 DIAGNOSIS — Z79899 Other long term (current) drug therapy: Secondary | ICD-10-CM | POA: Diagnosis not present

## 2021-10-14 DIAGNOSIS — R109 Unspecified abdominal pain: Secondary | ICD-10-CM | POA: Diagnosis not present

## 2021-10-14 DIAGNOSIS — Z419 Encounter for procedure for purposes other than remedying health state, unspecified: Secondary | ICD-10-CM

## 2021-10-14 DIAGNOSIS — K8031 Calculus of bile duct with cholangitis, unspecified, with obstruction: Secondary | ICD-10-CM | POA: Diagnosis present

## 2021-10-14 DIAGNOSIS — K649 Unspecified hemorrhoids: Secondary | ICD-10-CM | POA: Diagnosis present

## 2021-10-14 DIAGNOSIS — E782 Mixed hyperlipidemia: Secondary | ICD-10-CM

## 2021-10-14 DIAGNOSIS — N83201 Unspecified ovarian cyst, right side: Secondary | ICD-10-CM

## 2021-10-14 DIAGNOSIS — R0789 Other chest pain: Secondary | ICD-10-CM | POA: Diagnosis not present

## 2021-10-14 DIAGNOSIS — Z9049 Acquired absence of other specified parts of digestive tract: Secondary | ICD-10-CM | POA: Diagnosis not present

## 2021-10-14 DIAGNOSIS — R079 Chest pain, unspecified: Secondary | ICD-10-CM | POA: Diagnosis present

## 2021-10-14 DIAGNOSIS — I2510 Atherosclerotic heart disease of native coronary artery without angina pectoris: Secondary | ICD-10-CM | POA: Diagnosis present

## 2021-10-14 DIAGNOSIS — Z87891 Personal history of nicotine dependence: Secondary | ICD-10-CM | POA: Diagnosis not present

## 2021-10-14 DIAGNOSIS — Z885 Allergy status to narcotic agent status: Secondary | ICD-10-CM | POA: Diagnosis not present

## 2021-10-14 DIAGNOSIS — E876 Hypokalemia: Secondary | ICD-10-CM | POA: Diagnosis not present

## 2021-10-14 DIAGNOSIS — Z9851 Tubal ligation status: Secondary | ICD-10-CM | POA: Diagnosis not present

## 2021-10-14 DIAGNOSIS — R1013 Epigastric pain: Secondary | ICD-10-CM

## 2021-10-14 DIAGNOSIS — Z8249 Family history of ischemic heart disease and other diseases of the circulatory system: Secondary | ICD-10-CM

## 2021-10-14 DIAGNOSIS — K8020 Calculus of gallbladder without cholecystitis without obstruction: Secondary | ICD-10-CM | POA: Diagnosis not present

## 2021-10-14 DIAGNOSIS — R7989 Other specified abnormal findings of blood chemistry: Secondary | ICD-10-CM

## 2021-10-14 DIAGNOSIS — Z20822 Contact with and (suspected) exposure to covid-19: Secondary | ICD-10-CM | POA: Diagnosis present

## 2021-10-14 DIAGNOSIS — Z0389 Encounter for observation for other suspected diseases and conditions ruled out: Secondary | ICD-10-CM | POA: Diagnosis not present

## 2021-10-14 DIAGNOSIS — R7401 Elevation of levels of liver transaminase levels: Secondary | ICD-10-CM | POA: Diagnosis not present

## 2021-10-14 DIAGNOSIS — K219 Gastro-esophageal reflux disease without esophagitis: Secondary | ICD-10-CM

## 2021-10-14 DIAGNOSIS — K449 Diaphragmatic hernia without obstruction or gangrene: Secondary | ICD-10-CM | POA: Diagnosis not present

## 2021-10-14 DIAGNOSIS — Z91048 Other nonmedicinal substance allergy status: Secondary | ICD-10-CM | POA: Diagnosis not present

## 2021-10-14 DIAGNOSIS — Z7983 Long term (current) use of bisphosphonates: Secondary | ICD-10-CM

## 2021-10-14 DIAGNOSIS — I700 Atherosclerosis of aorta: Secondary | ICD-10-CM | POA: Diagnosis present

## 2021-10-14 DIAGNOSIS — K8050 Calculus of bile duct without cholangitis or cholecystitis without obstruction: Secondary | ICD-10-CM | POA: Diagnosis not present

## 2021-10-14 DIAGNOSIS — K838 Other specified diseases of biliary tract: Secondary | ICD-10-CM

## 2021-10-14 DIAGNOSIS — N83209 Unspecified ovarian cyst, unspecified side: Secondary | ICD-10-CM | POA: Diagnosis present

## 2021-10-14 LAB — HEPATIC FUNCTION PANEL
ALT: 239 U/L — ABNORMAL HIGH (ref 0–44)
AST: 195 U/L — ABNORMAL HIGH (ref 15–41)
Albumin: 3.2 g/dL — ABNORMAL LOW (ref 3.5–5.0)
Alkaline Phosphatase: 170 U/L — ABNORMAL HIGH (ref 38–126)
Bilirubin, Direct: 1.5 mg/dL — ABNORMAL HIGH (ref 0.0–0.2)
Indirect Bilirubin: 1.3 mg/dL — ABNORMAL HIGH (ref 0.3–0.9)
Total Bilirubin: 2.8 mg/dL — ABNORMAL HIGH (ref 0.3–1.2)
Total Protein: 6.6 g/dL (ref 6.5–8.1)

## 2021-10-14 LAB — BASIC METABOLIC PANEL
Anion gap: 11 (ref 5–15)
BUN: 9 mg/dL (ref 8–23)
CO2: 24 mmol/L (ref 22–32)
Calcium: 8.7 mg/dL — ABNORMAL LOW (ref 8.9–10.3)
Chloride: 104 mmol/L (ref 98–111)
Creatinine, Ser: 0.84 mg/dL (ref 0.44–1.00)
GFR, Estimated: >60 mL/min (ref >60)
Glucose, Bld: 131 mg/dL — ABNORMAL HIGH (ref 70–99)
Potassium: 3.7 mmol/L (ref 3.5–5.1)
Sodium: 139 mmol/L (ref 135–145)

## 2021-10-14 LAB — CBC
HCT: 40.7 % (ref 36.0–46.0)
Hemoglobin: 13.4 g/dL (ref 12.0–15.0)
MCH: 28.6 pg (ref 26.0–34.0)
MCHC: 32.9 g/dL (ref 30.0–36.0)
MCV: 87 fL (ref 80.0–100.0)
Platelets: 168 10*3/uL (ref 150–400)
RBC: 4.68 MIL/uL (ref 3.87–5.11)
RDW: 13.9 % (ref 11.5–15.5)
WBC: 7 10*3/uL (ref 4.0–10.5)
nRBC: 0 % (ref 0.0–0.2)

## 2021-10-14 LAB — PROTIME-INR
INR: 0.9 (ref 0.8–1.2)
Prothrombin Time: 12 seconds (ref 11.4–15.2)

## 2021-10-14 LAB — RESP PANEL BY RT-PCR (FLU A&B, COVID) ARPGX2
Influenza A by PCR: NEGATIVE
Influenza B by PCR: NEGATIVE
SARS Coronavirus 2 by RT PCR: NEGATIVE

## 2021-10-14 LAB — TROPONIN I (HIGH SENSITIVITY)
Troponin I (High Sensitivity): 5 ng/L (ref <18)
Troponin I (High Sensitivity): 3 ng/L (ref <18)

## 2021-10-14 LAB — HIV ANTIBODY (ROUTINE TESTING W REFLEX): HIV Screen 4th Generation wRfx: NONREACTIVE

## 2021-10-14 LAB — LIPASE, BLOOD: Lipase: 29 U/L (ref 11–51)

## 2021-10-14 MED ORDER — MORPHINE SULFATE (PF) 4 MG/ML IV SOLN
4.0000 mg | Freq: Once | INTRAVENOUS | Status: AC
  Administered 2021-10-14: 4 mg via INTRAVENOUS
  Filled 2021-10-14: qty 1

## 2021-10-14 MED ORDER — ACETAMINOPHEN 325 MG PO TABS
650.0000 mg | ORAL_TABLET | Freq: Four times a day (QID) | ORAL | Status: DC | PRN
  Administered 2021-10-14 – 2021-10-15 (×2): 650 mg via ORAL
  Filled 2021-10-14 (×2): qty 2

## 2021-10-14 MED ORDER — MORPHINE SULFATE (PF) 2 MG/ML IV SOLN: 2.0000 mg | INTRAVENOUS | Status: DC | PRN

## 2021-10-14 MED ORDER — SODIUM CHLORIDE 0.9 % IV SOLN: INTRAVENOUS | Status: DC

## 2021-10-14 MED ORDER — ONDANSETRON HCL 4 MG PO TABS
4.0000 mg | ORAL_TABLET | Freq: Four times a day (QID) | ORAL | Status: DC | PRN
Start: 2021-10-14 — End: 2021-10-16

## 2021-10-14 MED ORDER — FAMOTIDINE 20 MG PO TABS
20.0000 mg | ORAL_TABLET | Freq: Every day | ORAL | Status: DC
Start: 2021-10-14 — End: 2021-10-16
  Administered 2021-10-14 – 2021-10-15 (×2): 20 mg via ORAL
  Filled 2021-10-14 (×2): qty 1

## 2021-10-14 MED ORDER — ALUM & MAG HYDROXIDE-SIMETH 200-200-20 MG/5ML PO SUSP
30.0000 mL | Freq: Once | ORAL | Status: AC
  Administered 2021-10-14: 30 mL via ORAL
  Filled 2021-10-14: qty 30

## 2021-10-14 MED ORDER — ONDANSETRON HCL 4 MG/2ML IJ SOLN
4.0000 mg | Freq: Once | INTRAMUSCULAR | Status: AC
  Administered 2021-10-14: 4 mg via INTRAVENOUS
  Filled 2021-10-14: qty 2

## 2021-10-14 MED ORDER — ALBUTEROL SULFATE (2.5 MG/3ML) 0.083% IN NEBU: 2.5000 mg | INHALATION_SOLUTION | Freq: Four times a day (QID) | RESPIRATORY_TRACT | Status: DC | PRN

## 2021-10-14 MED ORDER — IOHEXOL 300 MG/ML  SOLN
100.0000 mL | Freq: Once | INTRAMUSCULAR | Status: AC | PRN
  Administered 2021-10-14: 100 mL via INTRAVENOUS

## 2021-10-14 MED ORDER — METOCLOPRAMIDE HCL 5 MG/ML IJ SOLN
10.0000 mg | Freq: Once | INTRAMUSCULAR | Status: AC
  Administered 2021-10-14: 10 mg via INTRAVENOUS
  Filled 2021-10-14: qty 2

## 2021-10-14 MED ORDER — LORATADINE 10 MG PO TABS
10.0000 mg | ORAL_TABLET | Freq: Every day | ORAL | Status: DC
  Administered 2021-10-14: 10 mg via ORAL
  Filled 2021-10-14: qty 1

## 2021-10-14 MED ORDER — ALPRAZOLAM 0.25 MG PO TABS: 0.2500 mg | ORAL_TABLET | Freq: Every day | ORAL | Status: DC | PRN

## 2021-10-14 MED ORDER — ENOXAPARIN SODIUM 40 MG/0.4ML IJ SOSY
40.0000 mg | PREFILLED_SYRINGE | INTRAMUSCULAR | Status: DC
  Administered 2021-10-14: 40 mg via SUBCUTANEOUS
  Filled 2021-10-14: qty 0.4

## 2021-10-14 MED ORDER — PANTOPRAZOLE SODIUM 40 MG PO TBEC
40.0000 mg | DELAYED_RELEASE_TABLET | Freq: Every day | ORAL | Status: DC
  Administered 2021-10-16: 40 mg via ORAL
  Filled 2021-10-14: qty 1

## 2021-10-14 MED ORDER — ONDANSETRON HCL 4 MG/2ML IJ SOLN
4.0000 mg | Freq: Four times a day (QID) | INTRAMUSCULAR | Status: DC | PRN
  Administered 2021-10-14 – 2021-10-15 (×3): 4 mg via INTRAVENOUS
  Filled 2021-10-14 (×3): qty 2

## 2021-10-14 MED ORDER — SODIUM CHLORIDE 0.9 % IV BOLUS
1000.0000 mL | Freq: Once | INTRAVENOUS | Status: AC
  Administered 2021-10-14: 1000 mL via INTRAVENOUS

## 2021-10-14 MED ORDER — PANTOPRAZOLE SODIUM 40 MG IV SOLR
40.0000 mg | Freq: Once | INTRAVENOUS | Status: AC
  Administered 2021-10-14: 40 mg via INTRAVENOUS
  Filled 2021-10-14: qty 10

## 2021-10-14 MED ORDER — GADOBUTROL 1 MMOL/ML IV SOLN
6.0000 mL | Freq: Once | INTRAVENOUS | Status: AC | PRN
  Administered 2021-10-14: 6 mL via INTRAVENOUS

## 2021-10-14 MED ORDER — SODIUM CHLORIDE 0.9 % IV BOLUS
1000.0000 mL | Freq: Once | INTRAVENOUS | Status: AC
Start: 2021-10-14 — End: 2021-10-14
  Administered 2021-10-14: 1000 mL via INTRAVENOUS

## 2021-10-14 MED ORDER — RISAQUAD PO CAPS: ORAL_CAPSULE | Freq: Every day | ORAL | Status: DC

## 2021-10-14 MED ORDER — PIPERACILLIN-TAZOBACTAM 3.375 G IVPB
3.3750 g | Freq: Three times a day (TID) | INTRAVENOUS | Status: DC
  Administered 2021-10-14 – 2021-10-16 (×7): 3.375 g via INTRAVENOUS
  Filled 2021-10-14 (×7): qty 50

## 2021-10-14 MED ORDER — SODIUM CHLORIDE 0.9% FLUSH
3.0000 mL | Freq: Two times a day (BID) | INTRAVENOUS | Status: DC
  Administered 2021-10-14 – 2021-10-15 (×2): 3 mL via INTRAVENOUS

## 2021-10-14 MED ORDER — URSODIOL 300 MG PO CAPS
300.0000 mg | ORAL_CAPSULE | Freq: Two times a day (BID) | ORAL | Status: DC
  Filled 2021-10-14 (×6): qty 1

## 2021-10-14 NOTE — ED Provider Notes (Signed)
[facility] EMERGENCY DEPARTMENT Provider Note   CSN: [id number] Arrival date & time: 10/14/21  0405     History  Chief Complaint  Patient presents with   Chest Pain    Jasmine Bradford is a 66 y.o. female.  66 yo F with a cc of upper abdominal pain.  Going on for the past four months.  Seems to occur off an on.  Not sure exactly what makes it occur.  Nothing seems to make it better or worse.  Can last for about a day after it happens.  Has seen her PCP and GI, has had labs, CT, [place], plan for MRI in the upcoming weeks.  Tonight was too severe came in for eval.  Chills but no fevers.  N/v at times had it tonight.     Chest Pain     Home Medications Prior to Admission medications   Medication Sig Start Date End Date Taking? Authorizing Provider  acetaminophen (TYLENOL) 500 MG tablet Take 500 mg by mouth every 6 (six) hours as needed for mild pain or moderate pain.   Yes [provider]  Calcium Carbonate-Vit D-Min (CALCIUM 1200 PO) Take 1 tablet by mouth daily.   Yes [provider]  cetirizine (ZYRTEC) 10 MG tablet Take 10 mg by mouth daily as needed for allergies.   Yes [provider]  famotidine (PEPCID) 20 MG tablet TAKE 1 TABLET BY MOUTH EVERYDAY AT BEDTIME Patient taking differently: Take 20 mg by mouth daily. TAKE 1 TABLET BY MOUTH EVERYDAY AT BEDTIME 04/19/21  Yes Cirigliano, Vito V, DO  Multiple Vitamin (MULTIVITAMIN WITH MINERALS) TABS tablet Take 1 tablet by mouth daily with lunch.   Yes [provider]  omeprazole (PRILOSEC) 40 MG capsule TAKE 1 CAPSULE BY MOUTH EVERY DAY Patient taking differently: Take 40 mg by mouth daily. TAKE 1 CAPSULE BY MOUTH EVERY DAY 08/31/21  Yes Cirigliano, Vito V, DO  Probiotic Product (PROBIOTIC PO) Take 1 tablet by mouth daily.   Yes [provider]  ursodiol (ACTIGALL) 250 MG tablet TAKE 1 TABLET IN THE MORNING AND AT BEDTIME. (MAY TAKE 1 TWICE A DAY OR 2 TAB(S) ONCE  DAILY. Patient taking differently: Take 250 mg by mouth 2 (two) times daily. 04/19/21  Yes Cirigliano, Vito V, DO  alendronate (FOSAMAX) 70 MG tablet Take 70 mg by mouth once a week. Take with a full glass of water on an empty stomach. Patient not taking: Reported on 09/22/2021    [provider]  ALPRAZolam Duanne Moron) 0.25 MG tablet Take 1 tablet (0.25 mg total) by mouth daily as needed for anxiety. 10/06/21   Nche, Charlene Brooke, NP  pravastatin (PRAVACHOL) 20 MG tablet Take 1 tablet (20 mg total) by mouth at bedtime. 10/06/21   Nche, Charlene Brooke, NP      Allergies    Oxycodone and Potassium    Review of Systems   Review of Systems  Cardiovascular:  Positive for chest pain.   Physical Exam Updated Vital Signs BP (!) 86/53    Pulse 66    Temp 98.1 F (36.7 C) (Oral)    Resp 16    Ht 5\' 1"  (1.549 m)    Wt 61 kg    SpO2 97%    BMI 25.41 kg/m  Physical Exam Vitals and nursing note reviewed.  Constitutional:      General: She is not in acute distress.    Appearance: She is well-developed. She is not diaphoretic.  HENT:  Head: Normocephalic and atraumatic.  Eyes:     Pupils: Pupils are equal, round, and reactive to light.  Cardiovascular:     Rate and Rhythm: Normal rate and regular rhythm.     Heart sounds: No murmur heard.   No friction rub. No gallop.  Pulmonary:     Effort: Pulmonary effort is normal.     Breath sounds: No wheezing or rales.  Abdominal:     General: There is no distension.     Palpations: Abdomen is soft.     Tenderness: There is no abdominal tenderness.  Musculoskeletal:        General: No tenderness.     Cervical back: Normal range of motion and neck supple.  Skin:    General: Skin is warm and dry.  Neurological:     Mental Status: She is alert and oriented to person, place, and time.  Psychiatric:        Behavior: Behavior normal.    ED Results / Procedures / Treatments   Labs (all labs ordered are listed, but only abnormal results are  displayed) Labs Reviewed  BASIC METABOLIC PANEL - Abnormal; Notable for the following components:      Result Value   Glucose, Bld 131 (*)    Calcium 8.7 (*)    All other components within normal limits  HEPATIC FUNCTION PANEL - Abnormal; Notable for the following components:   Albumin 3.2 (*)    AST 195 (*)    ALT 239 (*)    Alkaline Phosphatase 170 (*)    Total Bilirubin 2.8 (*)    Bilirubin, Direct 1.5 (*)    Indirect Bilirubin 1.3 (*)    All other components within normal limits  RESP PANEL BY RT-PCR (FLU A&B, COVID) ARPGX2  PROTIME-INR  CBC  LIPASE, BLOOD  HIV ANTIBODY (ROUTINE TESTING W REFLEX)  COMPREHENSIVE METABOLIC PANEL  CBC  TROPONIN I (HIGH SENSITIVITY)  TROPONIN I (HIGH SENSITIVITY)    EKG EKG Interpretation  Date/Time:  Thursday October 14 2021 04:06:19 EST Ventricular Rate:  92 PR Interval:  138 QRS Duration: 66 QT Interval:  350 QTC Calculation: 432 R Axis:   86 Text Interpretation: Normal sinus rhythm Normal ECG No significant change since last tracing Confirmed by Deno Etienne [phone]) on 10/14/2021 4:44:13 AM  Radiology DG Chest 2 View  Result Date: 10/14/2021 CLINICAL DATA:  66 year old female with history of chest pain. EXAM: CHEST - 2 VIEW COMPARISON:  Chest x-ray 08/20/2021. FINDINGS: Lung volumes are increased with mild emphysematous changes. No consolidative airspace disease. No pleural effusions. No pneumothorax. No pulmonary nodule or mass noted. Pulmonary vasculature and the cardiomediastinal silhouette are within normal limits. Atherosclerosis in the thoracic aorta. IMPRESSION: 1.  No radiographic evidence of acute cardiopulmonary disease. 2. Aortic atherosclerosis. 3. Emphysema. Electronically Signed   By: Vinnie Langton M.D.   On: 10/14/2021 05:44   CT ABDOMEN PELVIS W CONTRAST  Result Date: 10/14/2021 CLINICAL DATA:  Epigastric pain EXAM: CT ABDOMEN AND PELVIS WITH CONTRAST TECHNIQUE: Multidetector CT imaging of the abdomen and pelvis was  performed using the standard protocol following bolus administration of intravenous contrast. RADIATION DOSE REDUCTION: This exam was performed according to the departmental dose-optimization program which includes automated exposure control, adjustment of the mA and/or kV according to patient size and/or use of iterative reconstruction technique. CONTRAST:  11mL OMNIPAQUE IOHEXOL 300 MG/ML  SOLN COMPARISON:  09/22/2021 abdominal CT and abdomen and pelvis CT 02/18/2020 FINDINGS: Lower chest:  No contributory findings. Hepatobiliary: No  focal liver abnormality.Marked intra and extrahepatic biliary dilatation with scattered pneumobilia that is chronic. There is transition to a normal diameter or even collapse caliber at the level of the pancreatic head, presumed stricture. Cholecystectomy. No detected biliary inflammation or stone. Pancreas: Unremarkable. Spleen: Unremarkable. Adrenals/Urinary Tract: Negative adrenals. No hydronephrosis or stone. Unremarkable bladder. Stomach/Bowel: No obstruction. No appendicitis. Formed stool throughout much of the colon with fecalized appearance of terminal ileal contents. Left colonic diverticulosis. Vascular/Lymphatic: No acute vascular abnormality. Enlarged lymph nodes in the deep liver drainage considered reactive in isolation. Reproductive:4.6 cm right ovarian cyst, previously 3.6 cm. Because this lesion is not adequately characterized, prompt [place] is recommended for further evaluation. Note: This recommendation does not apply to premenarchal patients and to those with increased risk (genetic, family history, elevated tumor markers or other high-risk factors) of ovarian cancer. Reference: JACR 2020 Feb; 17(2):248-254 Multiple clips at the bilateral adnexa. Other: No ascites or pneumoperitoneum. Musculoskeletal: No acute abnormalities. Lumbar spine degeneration and mild scoliosis. IMPRESSION: 1. No acute finding. 2. 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 3. 4.5 cm right ovarian cyst which has enlarged by 1 cm since 2021, recommend gynecology follow-up for pelvic ultrasound. Electronically Signed   By: Jorje Guild M.D.   On: 10/14/2021 06:04   MR ABDOMEN MRCP W WO CONTAST  Result Date: 10/14/2021 CLINICAL DATA:  Abdominal pain and fever. Elevated liver function tests. Question bile duct stricture. Cholecystectomy 5 years ago. EXAM: MRI ABDOMEN WITHOUT AND WITH CONTRAST (INCLUDING MRCP) TECHNIQUE: Multiplanar multisequence MR imaging of the abdomen was performed both before and after the administration of intravenous contrast. Heavily T2-weighted images of the biliary and pancreatic ducts were obtained, and three-dimensional MRCP images were rendered by post processing. CONTRAST:  37mL GADAVIST GADOBUTROL 1 MMOL/ML IV SOLN COMPARISON:  Today's CT and an MRI 02/18/2020. FINDINGS: Lower chest: Normal heart size without pericardial or pleural effusion. Hepatobiliary: Heterogeneous arterial phase enhancement is likely related to bile duct obstruction. No suspicious liver lesion. Lesions status post cholecystectomy. Moderate intrahepatic biliary duct dilatation. The common duct measures 15 mm in the porta hepatis on 18/4 versus 12 mm on the 02/18/2020 MRI. Followed to the level of a mid to distal common duct stone, including at 1.3 cm on 16/4, 1.5 cm on 18/7, and 1.5 cm on 30/13. This is at the site of smaller stones on the 02/18/2020 MRI. There is suggestion of hyperenhancement involving the biliary ducts. Example coronal image 42/28. Pancreas:  Normal, without mass or ductal dilatation. Spleen:  Normal in size, without focal abnormality. Adrenals/Urinary Tract: Normal adrenal glands. Normal kidneys, without hydronephrosis. Stomach/Bowel: Proximal gastric underdistention. Tiny hiatal hernia. Normal abdominal bowel loops. Vascular/Lymphatic: Aortic atherosclerosis. Patent portal and splenic veins. No abdominal adenopathy. Other:  No  abdominal ascites. Musculoskeletal: No acute osseous abnormality. IMPRESSION: 1. Progressive biliary duct dilatation since the MRI of 02/18/2020 secondary to a dominant 15 mm mid to distal common duct stone. Mild biliary ductal enhancement in the setting of obstruction is nonspecific. Recommend clinical correlation for superimposed cholangitis. 2.  Aortic Atherosclerosis (ICD10-I70.0). Electronically Signed   By: Abigail Miyamoto M.D.   On: 10/14/2021 13:08    Procedures Procedures    Medications Ordered in ED Medications  piperacillin-tazobactam (ZOSYN) IVPB 3.375 g (3.375 g Intravenous New Bag/Given 10/14/21 1431)  enoxaparin (LOVENOX) injection 40 mg (40 mg Subcutaneous Given 10/14/21 0950)  sodium chloride flush (NS) 0.9 % injection 3 mL (3 mLs Intravenous Given 10/14/21 0950)  ondansetron [facility]) tablet 4 mg ( Oral See Alternative 10/14/21 1543)    Or  ondansetron (ZOFRAN) injection 4 mg (4 mg Intravenous Given 10/14/21 1543)  albuterol (PROVENTIL) (2.5 MG/3ML) 0.083% nebulizer solution 2.5 mg (has no administration in time range)  0.9 %  sodium chloride infusion ( Intravenous New Bag/Given 10/14/21 0949)  morphine (PF) 2 MG/ML injection 2 mg (has no administration in time range)  ALPRAZolam (XANAX) tablet 0.25 mg (has no administration in time range)  famotidine (PEPCID) tablet 20 mg (has no administration in time range)  pantoprazole (PROTONIX) EC tablet 40 mg (has no administration in time range)  acidophilus (RISAQUAD) capsule (has no administration in time range)  loratadine (CLARITIN) tablet 10 mg (10 mg Oral Given 10/14/21 1303)  ursodiol (ACTIGALL) capsule 300 mg (has no administration in time range)  acetaminophen (TYLENOL) tablet 650 mg (650 mg Oral Given 10/14/21 1821)  morphine (PF) 4 MG/ML injection 4 mg (4 mg Intravenous Given 10/14/21 0541)  ondansetron (ZOFRAN) injection 4 mg (4 mg Intravenous Given 10/14/21 0541)  sodium chloride 0.9 % bolus 1,000 mL (0 mLs Intravenous Stopped  10/14/21 0637)  alum & mag hydroxide-simeth (MAALOX/MYLANTA) 200-200-20 MG/5ML suspension 30 mL (30 mLs Oral Given 10/14/21 0541)  iohexol (OMNIPAQUE) 300 MG/ML solution 100 mL (100 mLs Intravenous Contrast Given 10/14/21 0554)  morphine (PF) 4 MG/ML injection 4 mg (4 mg Intravenous Given 10/14/21 0804)  metoCLOPramide (REGLAN) injection 10 mg (10 mg Intravenous Given 10/14/21 0802)  pantoprazole (PROTONIX) injection 40 mg (40 mg Intravenous Given 10/14/21 1302)  gadobutrol (GADAVIST) 1 MMOL/ML injection 6 mL (6 mLs Intravenous Contrast Given 10/14/21 1213)  sodium chloride 0.9 % bolus 1,000 mL (1,000 mLs Intravenous New Bag/Given 10/14/21 2219)    ED Course/ Medical Decision Making/ A&P                           Medical Decision Making Amount and/or Complexity of Data Reviewed Labs: ordered. Radiology: ordered.  Risk OTC drugs. Prescription drug management. Decision regarding hospitalization.   66 yo F with chief complaints of chest discomfort.  This been going on for 4 months.  Happened off and on.  Occurred this evening had some nausea and vomiting with it.  She has seen her family doctor as well as GI.  On review of this notes I see that she had a CT of the chest abdomen and pelvis.  [place], labs.  Thought to be likely reflux.   Will obtain labs, treat pain and nausea.    Chest x-ray independently interpreted by me without focal infiltrate and pneumothorax.  CBC without significant anemia no leukocytosis.  BMP without renal dysfunction. LFTs lipase.  Troponin negative.  Plan for CT abdomen pelvis.   CT with chronic CBD dilatation.  LFT and lipase added on.   Signed out to Dr. Maryan Rued, please see their note for further details of care in the ED.   The patients results and plan were reviewed and discussed.   Any x-rays performed were independently reviewed by myself.   Differential diagnosis were considered with the presenting HPI.  Medications  piperacillin-tazobactam (ZOSYN) IVPB  3.375 g (3.375 g Intravenous New Bag/Given 10/14/21 1431)  enoxaparin (LOVENOX) injection 40 mg (40 mg Subcutaneous Given 10/14/21 0950)  sodium chloride flush (NS) 0.9 % injection 3 mL (3 mLs Intravenous Given 10/14/21 0950)  ondansetron (ZOFRAN) tablet 4 mg ( Oral See Alternative 10/14/21 1543)    Or  ondansetron (ZOFRAN) injection 4  mg (4 mg Intravenous Given 10/14/21 1543)  albuterol (PROVENTIL) (2.5 MG/3ML) 0.083% nebulizer solution 2.5 mg (has no administration in time range)  0.9 %  sodium chloride infusion ( Intravenous New Bag/Given 10/14/21 0949)  morphine (PF) 2 MG/ML injection 2 mg (has no administration in time range)  ALPRAZolam (XANAX) tablet 0.25 mg (has no administration in time range)  famotidine (PEPCID) tablet 20 mg (has no administration in time range)  pantoprazole (PROTONIX) EC tablet 40 mg (has no administration in time range)  acidophilus (RISAQUAD) capsule (has no administration in time range)  loratadine (CLARITIN) tablet 10 mg (10 mg Oral Given 10/14/21 1303)  ursodiol (ACTIGALL) capsule 300 mg (has no administration in time range)  acetaminophen (TYLENOL) tablet 650 mg (650 mg Oral Given 10/14/21 1821)  morphine (PF) 4 MG/ML injection 4 mg (4 mg Intravenous Given 10/14/21 0541)  ondansetron (ZOFRAN) injection 4 mg (4 mg Intravenous Given 10/14/21 0541)  sodium chloride 0.9 % bolus 1,000 mL (0 mLs Intravenous Stopped 10/14/21 0637)  alum & mag hydroxide-simeth (MAALOX/MYLANTA) 200-200-20 MG/5ML suspension 30 mL (30 mLs Oral Given 10/14/21 0541)  iohexol (OMNIPAQUE) 300 MG/ML solution 100 mL (100 mLs Intravenous Contrast Given 10/14/21 0554)  morphine (PF) 4 MG/ML injection 4 mg (4 mg Intravenous Given 10/14/21 0804)  metoCLOPramide (REGLAN) injection 10 mg (10 mg Intravenous Given 10/14/21 0802)  pantoprazole (PROTONIX) injection 40 mg (40 mg Intravenous Given 10/14/21 1302)  gadobutrol (GADAVIST) 1 MMOL/ML injection 6 mL (6 mLs Intravenous Contrast Given 10/14/21 1213)  sodium  chloride 0.9 % bolus 1,000 mL (1,000 mLs Intravenous New Bag/Given 10/14/21 2219)    Vitals:   10/14/21 1437 10/14/21 1555 10/14/21 2043 10/14/21 2051  BP: 112/76 105/64 (!) 81/47 (!) 86/53  Pulse: 83  64 66  Resp: 14 16 16    Temp:  97.9 F (36.6 C) 98.1 F (36.7 C)   TempSrc:  Oral Oral   SpO2: 96% 95% 97%   Weight:      Height:        Final diagnoses:  Dilated cbd, acquired  Nausea and vomiting, unspecified vomiting type  Transaminitis            Final Clinical Impression(s) / ED Diagnoses Final diagnoses:  Dilated cbd, acquired  Nausea and vomiting, unspecified vomiting type  Transaminitis    Rx / DC Orders ED Discharge Orders     None         Deno Etienne, DO 10/14/21 2257

## 2021-10-14 NOTE — Assessment & Plan Note (Deleted)
Patient presents with complaints of epigastric abdominal pain, chills, nausea, and  vomiting.  Labs noted elevated liver enzymes and hyperbilirubinemia.  CT scan of the abdomen and pelvis are to previous study noting concern for possible CBD stricture.  Plans had already been set in place with Dr. Bryan Lemma of gastroenterology for patient to have MRCP.  Gastroenterology was formally consulted and placed orders for MRCP with likely need of ERCP, and started on empiric antibiotics of Zosyn.  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 -Admit to a MedSurg bed -Follow-up MRCP -Continue empiric antibiotics of Zosyn -Normal saline IV fluids at 164ml/hr -Morphine IV as needed for pain -Appreciate GI consultative services  will follow-up for any further recommendations

## 2021-10-14 NOTE — Assessment & Plan Note (Signed)
Acute on chronic.  CT imaging noted 4.5 cm right ovarian cyst enlarged by 1 cm since 2021. -Recommend gynecology follow-up outpatient and pelvic ultrasound

## 2021-10-14 NOTE — ED Notes (Signed)
Pt is NSR on monitor 

## 2021-10-14 NOTE — Assessment & Plan Note (Signed)
Home medication regimen includes pravastatin 20 mg nightly.  Patient had not recently taken the medication. -Continue to hold pravastatin due to elevated liver enzymes

## 2021-10-14 NOTE — ED Provider Notes (Signed)
Assumed care of patient from Dr. Tyrone Nine at 7 AM.  Patient with intermittent symptoms of upper abdominal pain nausea and vomiting for approximately 4 months.  She has been following up with GI and has an MRI scheduled for possibility of common bile duct stricture in 3 days.  However she had an episode of severe nausea and abdominal pain on Sunday night which improved but then yesterday started having again abdominal pain nausea and vomiting and it is not improved.  CT scan here does show chronic findings of the common bile duct.  LFTs are elevated today with AST of 195, ALT of 239 and total bilirubin of 2.8.  On repeat evaluation patient is still having pain and nausea.  She reported she tried to sip a little bit of water and it made her symptoms worse.  Will discuss with [facility] for further recommendations.  Assume patient will most likely need admission for ongoing evaluation.   Blanchie Dessert, MD 10/14/21 0930

## 2021-10-14 NOTE — ED Notes (Signed)
Patient to MRI.

## 2021-10-14 NOTE — Assessment & Plan Note (Addendum)
Acute on chronic.  Initial labs significant for alkaline phosphatase 170, AST 195, ALT 239, direct bilirubin 1.5, and indirect bilirubin 1.3.  Previously levels that only been mildly elevated in the past.  Hepatitis panels previously noted to be negative as of 2021.  Suspect elevated secondary to a biliary obstruction. -Recheck liver function studies in a.m.

## 2021-10-14 NOTE — H&P (View-Only) (Signed)
[facility] Consult: 8:45 AM 10/14/2021  LOS: 0 days    Referring Provider: Dr Maryan Rued in ED  Primary Care Physician:  Nche, Charlene Brooke, NP Primary Gastroenterologist:  Dr. Bryan Lemma     Reason for Consultation: Episodic upper abdominal pain with nausea, elevated LFTs.   HPI: Jasmine Bradford is a 66 y.o. female.  PMH Pneumonia, pleural effusion, thoracentesis 02/2020.    Dr. Bryan Lemma has been following issues w/ recurrent choledocholithiasis.  Episodes of pain in the epigastric/upper abdominal region, sometimes radiating to the back associated with fevers and sometimes nausea, rarely vomiting.  Associated mild elevation of transaminases up to 30s/70s.  T. bili and alk phos generally not elevated.  Occurrences date back 5 months and last about a day.  Sometimes she has to call in sick to work.  Current episode started on Sunday.  She has had the pain, fevers, sweats.  She had had 2 weeks of no symptoms and felt great.  Overall has lost about 5 pounds over 5 months. Dr. Bryan Lemma obtained 02/2021 abdominal ultrasound showed previous cholecystectomy, chronic intrahepatic hepatic ductal dilatation, stable from prior.  CTAP 09/22/21: Chronic intra and extrahepatic ductal dilatation, similar to previous studies.  Potential stricture at proximal CBD.  Incidental aortic atherosclerosis and to the CAD.  Dr C then ordered MRI/MRCP  set for this coming Sunday 2/26. She is back in ED w above SXS.   No fever.  Normotensive, no tachycardia or hypoxia. 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  4.5 cm R ovarian cyst, enlarged by 1 cm since 2021, recommend pelvic ultrasound, GYN input. T. bili 2.8, direct 1.5.  Alkaline phosphatase  170.  AST/ALT 195/239. Lipase 29.   WBCs normal.  INR normal.  Glucose a bit high at 131.  2017 Lap chole.   Previous ERCPs w sphincterotomy/stone extractions. Twice in 2017, once in 2019 [facility]) and latest in 01/2020 Lyndel Safe). 05/2018 ERCP w sphincterotomy and clean balloon sweep.  Performed dilation of the distal CBD/ampulla to enhance drainage of intrahepatics. 02/19/2020 ERCP.  Dr. Lyndel Safe extended previous biliary sphincterotomy and extracted CBD stones.  Ascending cholangitis noted. 09/2018 colonoscopy.  Magod.  Nonbleeding mixed hemorrhoids.  Sigmoid diverticulosis.  Total of 7 polyps removed from throughout the colon.  Pathology included SSA's and TA's.  No repeat colonoscopy (due 09/2021).    Takes ursodiol, Omeprazole, probiotic and famotidine at HS. Tylenol as needed for pain but not in large doses  Lives with her husband.  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 Does not drink, does not use tobacco products.  Family history negative for liver, biliary, pancreatic, intestinal, gastric issues.  She did not know her biological father's past history but her mother died with congestive heart failure.     Past Medical History:  Diagnosis Date   Cholelithiasis with choledocholithiasis    Diverticulitis    Tobacco abuse    Varicose vein of leg    no problems now   Wears glasses  Past Surgical History:  °Procedure Laterality Date  ° BILIARY DILATION  02/19/2020  ° Procedure: BILIARY DILATION;  Surgeon: Jolea Dolle, MD;  Location: [facility];  Service: Endoscopy;;  ° CESAREAN SECTION    ° CHOLECYSTECTOMY N/A 05/23/2016  ° Procedure: LAPAROSCOPIC CHOLECYSTECTOMY WITH INTRAOPERATIVE CHOLANGIOGRAM;  Surgeon: Todd Rosenbower, MD;  Location: [facility] OR;  Service: General;  Laterality: N/A;  ° ENDOSCOPIC RETROGRADE CHOLANGIOPANCREATOGRAPHY (ERCP) WITH PROPOFOL N/A 06/06/2016  ° Procedure: ENDOSCOPIC RETROGRADE CHOLANGIOPANCREATOGRAPHY (ERCP) WITH PROPOFOL;  Surgeon: Marc Magod, MD;   Location: [facility];  Service: Endoscopy;  Laterality: N/A;  ° ENDOSCOPIC RETROGRADE CHOLANGIOPANCREATOGRAPHY (ERCP) WITH PROPOFOL N/A 02/19/2020  ° Procedure: ENDOSCOPIC RETROGRADE CHOLANGIOPANCREATOGRAPHY (ERCP) WITH PROPOFOL;  Surgeon: Charlisha Market, MD;  Location: [facility];  Service: Endoscopy;  Laterality: N/A;  ° ERCP N/A 03/09/2016  ° Procedure: ENDOSCOPIC RETROGRADE CHOLANGIOPANCREATOGRAPHY (ERCP);  Surgeon: Marc Magod, MD;  Location: [facility];  Service: Endoscopy;  Laterality: N/A;  ° ERCP N/A 06/12/2018  ° Procedure: ENDOSCOPIC RETROGRADE CHOLANGIOPANCREATOGRAPHY (ERCP);  Surgeon: Magod, Marc, MD;  Location: [facility];  Service: Endoscopy;  Laterality: N/A;  ° ESOPHAGOGASTRODUODENOSCOPY N/A 06/12/2018  ° Procedure: ESOPHAGOGASTRODUODENOSCOPY (EGD);  Surgeon: Magod, Marc, MD;  Location: [facility];  Service: Endoscopy;  Laterality: N/A;  ° laser vein surgery    ° last year  ° REMOVAL OF STONES  06/12/2018  ° Procedure: REMOVAL OF STONES;  Surgeon: Magod, Marc, MD;  Location: [facility];  Service: Endoscopy;;  ° REMOVAL OF STONES  02/19/2020  ° Procedure: REMOVAL OF STONES;  Surgeon: Nyella Eckels, MD;  Location: [facility];  Service: Endoscopy;;  ° SPHINCTEROTOMY  06/12/2018  ° Procedure: SPHINCTEROTOMY;  Surgeon: Magod, Marc, MD;  Location: [facility];  Service: Endoscopy;;  ° SPHINCTEROTOMY  02/19/2020  ° Procedure: SPHINCTEROTOMY;  Surgeon: Wilda Wetherell, MD;  Location: [facility];  Service: Endoscopy;;  ° TUBAL LIGATION    ° VARICOSE VEIN SURGERY Left   ° ° °Prior to Admission medications   °Medication Sig Start Date End Date Taking? Authorizing Provider  °acetaminophen (TYLENOL) 500 MG tablet Take 500 mg by mouth every 6 (six) hours as needed for mild pain or moderate pain.   Yes [provider]  °Calcium Carbonate-Vit D-Min (CALCIUM 1200 PO) Take 1 tablet by mouth daily.   Yes [provider]  °cetirizine (ZYRTEC) 10 MG tablet Take 10 mg by mouth daily as needed  for allergies.   Yes [provider]  °famotidine (PEPCID) 20 MG tablet TAKE 1 TABLET BY MOUTH EVERYDAY AT BEDTIME °Patient taking differently: Take 20 mg by mouth daily. TAKE 1 TABLET BY MOUTH EVERYDAY AT BEDTIME 04/19/21  Yes Cirigliano, Vito V, DO  °Multiple Vitamin (MULTIVITAMIN WITH MINERALS) TABS tablet Take 1 tablet by mouth daily with lunch.   Yes [provider]  °omeprazole (PRILOSEC) 40 MG capsule TAKE 1 CAPSULE BY MOUTH EVERY DAY °Patient taking differently: Take 40 mg by mouth daily. TAKE 1 CAPSULE BY MOUTH EVERY DAY 08/31/21  Yes Cirigliano, Vito V, DO  °Probiotic Product (PROBIOTIC PO) Take 1 tablet by mouth daily.   Yes [provider]  °ursodiol (ACTIGALL) 250 MG tablet TAKE 1 TABLET IN THE MORNING AND AT BEDTIME. (MAY TAKE 1 TWICE A DAY OR 2 TAB(S) ONCE DAILY. °Patient taking differently: Take 250 mg by mouth 2 (two) times daily. 04/19/21  Yes Cirigliano, Vito V, DO  °alendronate (FOSAMAX) 70 MG tablet Take 70 mg by mouth once a week. Take with a full glass of water on an empty stomach. °  Patient not taking: Reported on 09/22/2021    [provider]  °ALPRAZolam (XANAX) 0.25 MG tablet Take 1 tablet (0.25 mg total) by mouth daily as needed for anxiety. 10/06/21   Nche, Charlotte Lum, NP  °pravastatin (PRAVACHOL) 20 MG tablet Take 1 tablet (20 mg total) by mouth at bedtime. 10/06/21   Nche, Charlotte Lum, NP  ° ° °Scheduled Meds: ° °Infusions: ° °PRN Meds: ° ° ° °Allergies as of 10/14/2021 - Review Complete 10/14/2021  °Allergen Reaction Noted  ° Oxycodone Nausea And Vomiting and Other (See Comments) 06/04/2016  ° Potassium Itching 02/23/2020  ° ° °Family History  °Problem Relation Age of Onset  ° Memory loss Mother   ° Ovarian cancer Mother   ° Colon polyps Neg Hx   ° Colon cancer Neg Hx   ° ° °Social History  ° °Socioeconomic History  ° Marital status: Married  °  Spouse name: Douglas  ° Number of children: 4  ° Years of education: Not on file  ° Highest education level:  Not on file  °Occupational History  ° Not on file  °Tobacco Use  ° Smoking status: Former  °  Packs/day: 0.50  °  Years: 35.00  °  Pack years: 17.50  °  Types: Cigarettes  ° Smokeless tobacco: Never  ° Tobacco comments:  °  No smoking since 02/2016- "wearing Nicotine patch"  °Vaping Use  ° Vaping Use: Never used  °Substance and Sexual Activity  ° Alcohol use: No  °  Alcohol/week: 0.0 standard drinks  ° Drug use: No  ° Sexual activity: Not Currently  °Other Topics Concern  ° Not on file  °Social History Narrative  ° Not on file  ° °Social Determinants of Health  ° °Financial Resource Strain: Low Risk   ° Difficulty of Paying Living Expenses: Not hard at all  °Food Insecurity: No Food Insecurity  ° Worried About Running Out of Food in the Last Year: Never true  ° Ran Out of Food in the Last Year: Never true  °Transportation Needs: No Transportation Needs  ° Lack of Transportation (Medical): No  ° Lack of Transportation (Non-Medical): No  °Physical Activity: Sufficiently Active  ° Days of Exercise per Week: 7 days  ° Minutes of Exercise per Session: 30 min  °Stress: No Stress Concern Present  ° Feeling of Stress : Only a little  °Social Connections: Moderately Isolated  ° Frequency of Communication with Friends and Family: More than three times a week  ° Frequency of Social Gatherings with Friends and Family: Twice a week  ° Attends Religious Services: Never  ° Active Member of Clubs or Organizations: No  ° Attends Club or Organization Meetings: Never  ° Marital Status: Married  °Intimate Partner Violence: Not At Risk  ° Fear of Current or Ex-Partner: No  ° Emotionally Abused: No  ° Physically Abused: No  ° Sexually Abused: No  ° ° °REVIEW OF SYSTEMS: °Constitutional: Feels somewhat tired and fatigued but nothing profound. °ENT:  No nose bleeds.  Patient is set for full dental extractions in the coming weeks.  She says that her reflux disease caused dental decay. °Pulm: No shortness of breath or cough. °CV:  No  palpitations, no LE edema.  No angina °GU: Urine can get dark with these episodes.  No hematuria, no frequency °GI: See HPI. °Heme: No unusual bleeding or bruising °Transfusions: None °Neuro:  No headaches, no peripheral tingling or numbness.  No syncope, no seizures. °Derm:  No   itching, no rash or sores.  °Endocrine:  No sweats or chills.  No polyuria or dysuria °Immunization: Reviewed. °Travel:  Not worried. ° ° °PHYSICAL EXAM: °Vital signs in last 24 hours: °Vitals:  ° 10/14/21 0804 10/14/21 0838  °BP: (!) 143/83 135/75  °Pulse: 83 91  °Resp: 20 16  °Temp:    °SpO2: 99% 95%  ° °Wt Readings from Last 3 Encounters:  °10/14/21 61 kg  °10/06/21 55.2 kg  °09/22/21 56 kg  ° ° °General: Pleasant, currently comfortable after pain meds.  Does not look toxic or ill.  Not jaundiced. °Head: No facial asymmetry or swelling.  No signs of head trauma. °Eyes: No scleral icterus or conjunctival pallor. °Ears: No hearing deficit °Nose: No congestion or discharge °Mouth: Fair dentition, some teeth are missing.  Tongue midline.  Mucosa moist, pink, clear. °Neck: No JVD, no masses, no thyromegaly °Lungs: Clear bilaterally.  No labored breathing or cough. °Heart: RRR.  No MRG.  S1, S2 present °Abdomen: Not distended.  Soft.  Active bowel sounds.  Currently not tender.  No HSM, masses, bruits, hernias..   °Rectal: Deferred °Musc/Skeltl: No gross joint deformities or swelling. °Extremities: No CCE. °Neurologic: Oriented x3.  Moves all 4 limbs with no deficits.  Strength not tested. °Skin: No jaundice, no rash, no sores, no telangiectasia. °Nodes: No cervical adenopathy °Psych: Calm, pleasant, cooperative.  Good historian. ° °Intake/Output from previous day: °02/22 0701 - 02/23 0700 °In: 949.1 [IV Piggyback:949.1] °Out: -  °Intake/Output this shift: °No intake/output data recorded. ° °LAB RESULTS: °Recent Labs  °  10/14/21 °0420  °WBC 7.0  °HGB 13.4  °HCT 40.7  °PLT 168  ° °BMET °Lab Results  °Component Value Date  ° NA 139 10/14/2021  °  NA 139 08/20/2021  ° NA 137 03/03/2021  ° K 3.7 10/14/2021  ° K 4.1 08/20/2021  ° K 3.7 03/03/2021  ° CL 104 10/14/2021  ° CL 100 08/20/2021  ° CL 102 03/03/2021  ° CO2 24 10/14/2021  ° CO2 26 08/20/2021  ° CO2 25 03/03/2021  ° GLUCOSE 131 (H) 10/14/2021  ° GLUCOSE 96 08/20/2021  ° GLUCOSE 115 (H) 03/03/2021  ° BUN 9 10/14/2021  ° BUN 15 08/20/2021  ° BUN 10 03/03/2021  ° CREATININE 0.84 10/14/2021  ° CREATININE 0.90 09/22/2021  ° CREATININE 0.86 08/20/2021  ° CALCIUM 8.7 (L) 10/14/2021  ° CALCIUM 9.2 08/20/2021  ° CALCIUM 8.3 (L) 03/03/2021  ° °LFT °Recent Labs  °  10/14/21 °0626  °PROT 6.6  °ALBUMIN 3.2*  °AST 195*  °ALT 239*  °ALKPHOS 170*  °BILITOT 2.8*  °BILIDIR 1.5*  °IBILI 1.3*  ° °PT/INR °Lab Results  °Component Value Date  ° INR 0.9 10/14/2021  ° INR 1.2 02/18/2020  ° INR 1.12 06/06/2016  ° °Hepatitis Panel °No results for input(s): HEPBSAG, HCVAB, HEPAIGM, HEPBIGM in the last 72 hours. °C-Diff °No components found for: CDIFF °Lipase  °   °Component Value Date/Time  ° LIPASE 29 10/14/2021 0626  ° ° °Drugs of Abuse  °   °Component Value Date/Time  ° LABOPIA NONE DETECTED 03/04/2016 0650  ° COCAINSCRNUR NONE DETECTED 03/04/2016 0650  ° LABBENZ NONE DETECTED 03/04/2016 0650  ° AMPHETMU NONE DETECTED 03/04/2016 0650  ° THCU NONE DETECTED 03/04/2016 0650  ° LABBARB POSITIVE (A) 03/04/2016 0650  °  ° °RADIOLOGY STUDIES: °DG Chest 2 View ° °Result Date: 10/14/2021 °CLINICAL DATA:  65-year-old female with history of chest pain. EXAM: CHEST - 2 VIEW COMPARISON:  Chest x-ray   08/20/2021. FINDINGS: Lung volumes are increased with mild emphysematous changes. No consolidative airspace disease. No pleural effusions. No pneumothorax. No pulmonary nodule or mass noted. Pulmonary vasculature and the cardiomediastinal silhouette are within normal limits. Atherosclerosis in the thoracic aorta. IMPRESSION: 1.  No radiographic evidence of acute cardiopulmonary disease. 2. Aortic atherosclerosis. 3. Emphysema. Electronically  Signed   By: Daniel  Entrikin M.D.   On: 10/14/2021 05:44  ° °CT ABDOMEN PELVIS W CONTRAST ° °Result Date: 10/14/2021 °CLINICAL DATA:  Epigastric pain EXAM: CT ABDOMEN AND PELVIS WITH CONTRAST TECHNIQUE: Multidetector CT imaging of the abdomen and pelvis was performed using the standard protocol following bolus administration of intravenous contrast. RADIATION DOSE REDUCTION: This exam was performed according to the departmental dose-optimization program which includes automated exposure control, adjustment of the mA and/or kV according to patient size and/or use of iterative reconstruction technique. CONTRAST:  100mL OMNIPAQUE IOHEXOL 300 MG/ML  SOLN COMPARISON:  09/22/2021 abdominal CT and abdomen and pelvis CT 02/18/2020 FINDINGS: Lower chest:  No contributory findings. Hepatobiliary: No focal liver abnormality.Marked intra and extrahepatic biliary dilatation with scattered pneumobilia that is chronic. There is transition to a normal diameter or even collapse caliber at the level of the pancreatic head, presumed stricture. Cholecystectomy. No detected biliary inflammation or stone. Pancreas: Unremarkable. Spleen: Unremarkable. Adrenals/Urinary Tract: Negative adrenals. No hydronephrosis or stone. Unremarkable bladder. Stomach/Bowel: No obstruction. No appendicitis. Formed stool throughout much of the colon with fecalized appearance of terminal ileal contents. Left colonic diverticulosis. Vascular/Lymphatic: No acute vascular abnormality. Enlarged lymph nodes in the deep liver drainage considered reactive in isolation. Reproductive:4.6 cm right ovarian cyst, previously 3.6 cm. Because this lesion is not adequately characterized, prompt US is recommended for further evaluation. Note: This recommendation does not apply to premenarchal patients and to those with increased risk (genetic, family history, elevated tumor markers or other high-risk factors) of ovarian cancer. Reference: JACR 2020 Feb; 17(2):248-254  Multiple clips at the bilateral adnexa. Other: No ascites or pneumoperitoneum. Musculoskeletal: No acute abnormalities. Lumbar spine degeneration and mild scoliosis. IMPRESSION: 1. No acute finding. 2. 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 3. 4.5 cm right ovarian cyst which has enlarged by 1 cm since 2021, recommend gynecology follow-up for pelvic ultrasound. Electronically Signed   By: Jonathan  Watts M.D.   On: 10/14/2021 06:04   ° ° ° °IMPRESSION:  ° °Episodes of epigastric pain, fevers plus minus nausea vomiting in pt w hx recurrent choledocholithiasis.  Current CT imaging suggests CBD stricture.  LFTs are now higher than they have ever been. ° °  R ovarian cyst.  Rads rec for pelvic US and gyn input.   ° ° ° °PLAN:  °  ° °  MRI/MRCP ordered.  Will need ERCP as well.  Going to put orders in depot ° °  Start Zosyn.   ° ° °Sarah Gribbin  10/14/2021, 8:45 AM °Phone [phone] ° ° Attending physician's note  ° °I have taken history, reviewed the chart and examined the patient. I performed a substantive portion of this encounter, including complete performance of at least one of the key components, in conjunction with the APP. I agree with the Advanced Practitioner's note, impression and recommendations.  ° °Recurrent choledocholithiasis in significantly dilated CBD with rising LFTs. No ascending cholangitis or pancreatitis. S/P cholecystectomy in remote past. No CBD strictures noted on MRCP. ° °Plan: °-ERCP in AM with spy, +/- EHL °-IV Zosyn d/t H/O fever °-Trend CBC, CMP °-  Check CA 19-9, CEA    I have explained risks and benefits including small but definite risks of pancreatitis (<10%), bleeding (<1%), perforation (<1%).  The risks of general anesthesia were also discussed.  The benefits were also discussed.  Patient wishes to proceed.  All the questions were answered.    Carmell Austria, MD [facility] [phone]

## 2021-10-14 NOTE — Assessment & Plan Note (Signed)
Patient presents with complaints of epigastric abdominal pain, chills, nausea, and  vomiting.  Labs noted elevated liver enzymes and hyperbilirubinemia.  CT scan of the abdomen and pelvis are to previous study noting concern for possible CBD stricture.  Plans had already been set in place with Dr. Bryan Lemma of gastroenterology for patient to have MRCP.  Gastroenterology was formally consulted and placed orders for MRCP with likely need of ERCP, and started on empiric antibiotics of Zosyn.  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 -Admit to a MedSurg bed -Follow-up MRCP -Continue empiric antibiotics of Zosyn -Normal saline IV fluids at 140ml/hr -Morphine IV as needed for pain -Appreciate GI consultative services  will follow-up for any further recommendations

## 2021-10-14 NOTE — Assessment & Plan Note (Signed)
-   Continue Pepcid nightly and Pharmacy substitution of Protonix for omeprazole

## 2021-10-14 NOTE — H&P (Addendum)
History and Physical    Patient: Jasmine Bradford YTK:[id number] DOB: 03-11-1956 DOA: 10/14/2021 DOS: the patient was seen and examined on 10/14/2021 PCP: Flossie Buffy, NP  Patient coming from: Home  Chief Complaint:  Chief Complaint  Patient presents with   Epigastric pain    HPI: Jasmine Bradford is a 66 y.o. female with medical history significant of hyperlipidemia, recurrent choledocholithiasis s/p cholecystectomy '17, and anxiety presents with complaints of epigastric abdominal pain which initially started 4 days ago.  She reports having sharp epigastric pain that radiates sometimes to her back and other times feels like it goes across her chest.  Pain can last several hours.  She reports associated symptoms of chills, subjective fevers, poor appetite, weight loss, nausea, and vomiting.  Patient has been taking Tylenol at home without significant relief in symptoms.  She has been dealing with this issue for several years, but episodes seem to have been becoming more frequent over the last 4-5 months.  She has currently been followed by Dr. Bryan Lemma gastroenterology.  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  Patient was set up to have an MRCP on 2/26, but recurrent symptoms brought her to the ED.  She denies having any diarrhea, blood in stools, or change in stool color.    Last hospitalized for similar symptoms and 2021 where she was found to choledocholithiasis with elevated LFTs, hyperbilirubinemia, and Klebsiella pneumoniae bacteremia.  Patient underwent ERCP with biliary sphincterotomy, sphincteroplasty followed by stone extraction and was treated with Zosyn and switch to Keflex to complete course.  Upon admission into the emergency department patient was noted to be afebrile with relatively stable vital signs.  Labs significant for alkaline  phosphatase 170, AST 195, ALT 239, direct bilirubin 1.5, indirect bilirubin 1.3, and high-sensitivity troponins negative x2.  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  Patient has been given 1 L normal saline IV fluids, Zofran, GI cocktail, morphine and Reglan.  Review of Systems: As mentioned in the history of present illness. All other systems reviewed and are negative. Past Medical History:  Diagnosis Date   Cholelithiasis with choledocholithiasis    Diverticulitis    Tobacco abuse    Varicose vein of leg    no problems now   Wears glasses    Past Surgical History:  Procedure Laterality Date   BILIARY DILATION  02/19/2020   Procedure: BILIARY DILATION;  Surgeon: Jackquline Denmark, MD;  Location: [facility] ENDOSCOPY;  Service: Endoscopy;;   CESAREAN SECTION     CHOLECYSTECTOMY N/A 05/23/2016   Procedure: LAPAROSCOPIC CHOLECYSTECTOMY WITH INTRAOPERATIVE CHOLANGIOGRAM;  Surgeon: Jackolyn Confer, MD;  Location: [facility];  Service: General;  Laterality: N/A;   ENDOSCOPIC RETROGRADE CHOLANGIOPANCREATOGRAPHY (ERCP) WITH PROPOFOL N/A 06/06/2016   Procedure: ENDOSCOPIC RETROGRADE CHOLANGIOPANCREATOGRAPHY (ERCP) WITH PROPOFOL;  Surgeon: Clarene Essex, MD;  Location: [facility] ENDOSCOPY;  Service: Endoscopy;  Laterality: N/A;   ENDOSCOPIC RETROGRADE CHOLANGIOPANCREATOGRAPHY (ERCP) WITH PROPOFOL N/A 02/19/2020   Procedure: ENDOSCOPIC RETROGRADE CHOLANGIOPANCREATOGRAPHY (ERCP) WITH PROPOFOL;  Surgeon: Jackquline Denmark, MD;  Location: [facility] ENDOSCOPY;  Service: Endoscopy;  Laterality: N/A;   ERCP N/A 03/09/2016   Procedure: ENDOSCOPIC RETROGRADE CHOLANGIOPANCREATOGRAPHY (ERCP);  Surgeon: Clarene Essex, MD;  Location: [facility] ENDOSCOPY;  Service: Endoscopy;  Laterality: N/A;  ERCP N/A 06/12/2018   Procedure: ENDOSCOPIC RETROGRADE CHOLANGIOPANCREATOGRAPHY (ERCP);  Surgeon: Clarene Essex, MD;  Location: [facility]  ENDOSCOPY;  Service: Endoscopy;  Laterality: N/A;   ESOPHAGOGASTRODUODENOSCOPY N/A 06/12/2018   Procedure: ESOPHAGOGASTRODUODENOSCOPY (EGD);  Surgeon: Clarene Essex, MD;  Location: [facility] ENDOSCOPY;  Service: Endoscopy;  Laterality: N/A;   laser vein surgery     last year   REMOVAL OF STONES  06/12/2018   Procedure: REMOVAL OF STONES;  Surgeon: Clarene Essex, MD;  Location: [facility] ENDOSCOPY;  Service: Endoscopy;;   REMOVAL OF STONES  02/19/2020   Procedure: REMOVAL OF STONES;  Surgeon: Jackquline Denmark, MD;  Location: [facility] ENDOSCOPY;  Service: Endoscopy;;   SPHINCTEROTOMY  06/12/2018   Procedure: Joan Mayans;  Surgeon: Clarene Essex, MD;  Location: [facility] ENDOSCOPY;  Service: Endoscopy;;   SPHINCTEROTOMY  02/19/2020   Procedure: Joan Mayans;  Surgeon: Jackquline Denmark, MD;  Location: [facility] ENDOSCOPY;  Service: Endoscopy;;   TUBAL LIGATION     VARICOSE VEIN SURGERY Left    Social History:  reports that she has quit smoking. Her smoking use included cigarettes. She has a 17.50 pack-year smoking history. She has never used smokeless tobacco. She reports that she does not drink alcohol and does not use drugs.  Allergies  Allergen Reactions   Oxycodone Nausea And Vomiting and Other (See Comments)    Also made patient lightheaded and lethargic   Potassium Itching    Family History  Problem Relation Age of Onset   Memory loss Mother    Ovarian cancer Mother    Colon polyps Neg Hx    Colon cancer Neg Hx     Prior to Admission medications   Medication Sig Start Date End Date Taking? Authorizing Provider  acetaminophen (TYLENOL) 500 MG tablet Take 500 mg by mouth every 6 (six) hours as needed for mild pain or moderate pain.   Yes [provider]  Calcium Carbonate-Vit D-Min (CALCIUM 1200 PO) Take 1 tablet by mouth daily.   Yes [provider]  cetirizine (ZYRTEC) 10 MG tablet Take 10 mg by mouth daily as needed for allergies.   Yes [provider]  famotidine (PEPCID) 20 MG tablet TAKE  1 TABLET BY MOUTH EVERYDAY AT BEDTIME Patient taking differently: Take 20 mg by mouth daily. TAKE 1 TABLET BY MOUTH EVERYDAY AT BEDTIME 04/19/21  Yes Cirigliano, Vito V, DO  Multiple Vitamin (MULTIVITAMIN WITH MINERALS) TABS tablet Take 1 tablet by mouth daily with lunch.   Yes [provider]  omeprazole (PRILOSEC) 40 MG capsule TAKE 1 CAPSULE BY MOUTH EVERY DAY Patient taking differently: Take 40 mg by mouth daily. TAKE 1 CAPSULE BY MOUTH EVERY DAY 08/31/21  Yes Cirigliano, Vito V, DO  Probiotic Product (PROBIOTIC PO) Take 1 tablet by mouth daily.   Yes [provider]  ursodiol (ACTIGALL) 250 MG tablet TAKE 1 TABLET IN THE MORNING AND AT BEDTIME. (MAY TAKE 1 TWICE A DAY OR 2 TAB(S) ONCE DAILY. Patient taking differently: Take 250 mg by mouth 2 (two) times daily. 04/19/21  Yes Cirigliano, Vito V, DO  alendronate (FOSAMAX) 70 MG tablet Take 70 mg by mouth once a week. Take with a full glass of water on an empty stomach. Patient not taking: Reported on 09/22/2021    [provider]  ALPRAZolam Duanne Moron) 0.25 MG tablet Take 1 tablet (0.25 mg total) by mouth daily as needed for anxiety. 10/06/21   Nche, Charlene Brooke, NP  pravastatin (PRAVACHOL) 20 MG tablet Take 1 tablet (20 mg total) by mouth at bedtime.  10/06/21   Flossie Buffy, NP    Physical Exam: Vitals:   10/14/21 0730 10/14/21 0804 10/14/21 0838 10/14/21 0900  BP: 134/74 (!) 143/83 135/75 130/69  Pulse: 86 83 91 92  Resp: (!) 22 20 16 19   Temp:      TempSrc:      SpO2: 96% 99% 95% 94%  Weight:      Height:        Constitutional: Elderly female currently in no acute distress Eyes: PERRL, lids.  Conjunctival injection appreciated.  No scleral icterus ENMT: Mucous membranes are moist. Posterior pharynx clear of any exudate or lesions.   Neck: normal, supple, no masses, no thyromegaly Respiratory: clear to auscultation bilaterally, no wheezing, no crackles. Normal respiratory effort.  .  Cardiovascular:  Regular rate and rhythm, no murmurs / rubs / gallops. No extremity edema.  Abdomen: no tenderness present at this time.  Bowel sounds present all 4 quadrants  Musculoskeletal: no clubbing / cyanosis. No joint deformity upper and lower extremities. Good ROM, no contractures. Normal muscle tone.  Skin: no rashes, lesions, ulcers. No induration Neurologic: CN 2-12 grossly intact.   Strength 5/5 in all 4.  Psychiatric: Normal judgment and insight. Alert and oriented x 3. Normal mood.    Data Reviewed:  EKG noted normal sinus rhythm at 92 bpm with significant ischemic changes.  Assessment and Plan: * Choledocholithiasis- (present on admission) Patient presents with complaints of epigastric abdominal pain, chills, nausea, and  vomiting.  Labs noted elevated liver enzymes and hyperbilirubinemia.  CT scan of the abdomen and pelvis are to previous study noting concern for possible CBD stricture.  Plans had already been set in place with Dr. Bryan Lemma of gastroenterology for patient to have MRCP.  Gastroenterology was formally consulted and placed orders for MRCP with likely need of ERCP, and started on empiric antibiotics of Zosyn.  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 -Admit to a MedSurg bed -Follow-up MRCP -N.p.o. after midnight for possible need of ERCP in a.m. -Continue empiric antibiotics of Zosyn -Normal saline IV fluids at 14ml/hr -Morphine IV as needed for pain -Appreciate GI consultative services  will follow-up for any further recommendations  Epigastric abdominal pain with nausea and vomiting- (present on admission) Thought to be likely secondary to biliary obstruction as noted above. -Aspiration precautions -Elevation of the head of the bed -Antiemetics as needed  Ovarian cyst- (present on admission) Acute on chronic.  CT imaging noted 4.5 cm right ovarian cyst enlarged by 1 cm  since 2021. -Recommend gynecology follow-up outpatient and pelvic ultrasound  Mixed hyperlipidemia- (present on admission) Home medication regimen includes pravastatin 20 mg nightly.  Patient had not recently taken the medication. -Continue to hold pravastatin due to elevated liver enzymes  GERD (gastroesophageal reflux disease)- (present on admission) - Continue Pepcid nightly and Pharmacy substitution of Protonix for omeprazole  Elevated LFTs and hyperbilirubinemia- (present on admission) Acute on chronic.  Initial labs significant for alkaline phosphatase 170, AST 195, ALT 239, direct bilirubin 1.5, and indirect bilirubin 1.3.  Previously levels that only been mildly elevated in the past.  Hepatitis panels previously noted to be negative as of 2021.  Suspect elevated secondary to a biliary obstruction. -Recheck liver function studies in a.m.       Advance Care Planning:   Code Status: Full Code   Consults: GI  Family Communication: None requested  Severity of Illness: The appropriate patient status for this patient  is OBSERVATION. Observation status is judged to be reasonable and necessary in order to provide the required intensity of service to ensure the patient's safety. The patient's presenting symptoms, physical exam findings, and initial radiographic and laboratory data in the context of their medical condition is felt to place them at decreased risk for further clinical deterioration. Furthermore, it is anticipated that the patient will be medically stable for discharge from the hospital within 2 midnights of admission.   Author: Norval Morton, MD 10/14/2021 9:09 AM  For on call review [email or web address].

## 2021-10-14 NOTE — ED Notes (Signed)
To room from xray

## 2021-10-14 NOTE — ED Notes (Signed)
Patient alert ands oriented, denies any complaints at this time. Awaiting MRI.

## 2021-10-14 NOTE — Assessment & Plan Note (Addendum)
Thought to be likely secondary to biliary obstruction as noted above. -Aspiration precautions -Elevation of the head of the bed -Antiemetics as needed

## 2021-10-14 NOTE — ED Triage Notes (Signed)
Patient reports chronic/intermittent central chest pain for 4 months with mild SOB , emesis x6 this morning , no cough or fever .

## 2021-10-14 NOTE — Consult Note (Addendum)
[facility] Consult: 8:45 AM 10/14/2021  LOS: 0 days    Referring Provider: Dr Maryan Rued in ED  Primary Care Physician:  Nche, Charlene Brooke, NP Primary Gastroenterologist:  Dr. Bryan Lemma     Reason for Consultation: Episodic upper abdominal pain with nausea, elevated LFTs.   HPI: Jasmine Bradford is a 66 y.o. female.  PMH Pneumonia, pleural effusion, thoracentesis 02/2020.    Dr. Bryan Lemma has been following issues w/ recurrent choledocholithiasis.  Episodes of pain in the epigastric/upper abdominal region, sometimes radiating to the back associated with fevers and sometimes nausea, rarely vomiting.  Associated mild elevation of transaminases up to 30s/70s.  T. bili and alk phos generally not elevated.  Occurrences date back 5 months and last about a day.  Sometimes she has to call in sick to work.  Current episode started on Sunday.  She has had the pain, fevers, sweats.  She had had 2 weeks of no symptoms and felt great.  Overall has lost about 5 pounds over 5 months. Dr. Bryan Lemma obtained 02/2021 abdominal ultrasound showed previous cholecystectomy, chronic intrahepatic hepatic ductal dilatation, stable from prior.  CTAP 09/22/21: Chronic intra and extrahepatic ductal dilatation, similar to previous studies.  Potential stricture at proximal CBD.  Incidental aortic atherosclerosis and to the CAD.  Dr C then ordered MRI/MRCP  set for this coming Sunday 2/26. She is back in ED w above SXS.   No fever.  Normotensive, no tachycardia or hypoxia. 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  4.5 cm R ovarian cyst, enlarged by 1 cm since 2021, recommend pelvic ultrasound, GYN input. T. bili 2.8, direct 1.5.  Alkaline phosphatase  170.  AST/ALT 195/239. Lipase 29.   WBCs normal.  INR normal.  Glucose a bit high at 131.  2017 Lap chole.   Previous ERCPs w sphincterotomy/stone extractions. Twice in 2017, once in 2019 [facility]) and latest in 01/2020 Lyndel Safe). 05/2018 ERCP w sphincterotomy and clean balloon sweep.  Performed dilation of the distal CBD/ampulla to enhance drainage of intrahepatics. 02/19/2020 ERCP.  Dr. Lyndel Safe extended previous biliary sphincterotomy and extracted CBD stones.  Ascending cholangitis noted. 09/2018 colonoscopy.  Magod.  Nonbleeding mixed hemorrhoids.  Sigmoid diverticulosis.  Total of 7 polyps removed from throughout the colon.  Pathology included SSA's and TA's.  No repeat colonoscopy (due 09/2021).    Takes ursodiol, Omeprazole, probiotic and famotidine at HS. Tylenol as needed for pain but not in large doses  Lives with her husband.  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 Does not drink, does not use tobacco products.  Family history negative for liver, biliary, pancreatic, intestinal, gastric issues.  She did not know her biological father's past history but her mother died with congestive heart failure.     Past Medical History:  Diagnosis Date   Cholelithiasis with choledocholithiasis    Diverticulitis    Tobacco abuse    Varicose vein of leg    no problems now   Wears glasses  Past Surgical History:  Procedure Laterality Date   BILIARY DILATION  02/19/2020   Procedure: BILIARY DILATION;  Surgeon: Jackquline Denmark, MD;  Location: [facility] ENDOSCOPY;  Service: Endoscopy;;   CESAREAN SECTION     CHOLECYSTECTOMY N/A 05/23/2016   Procedure: LAPAROSCOPIC CHOLECYSTECTOMY WITH INTRAOPERATIVE CHOLANGIOGRAM;  Surgeon: Jackolyn Confer, MD;  Location: [place];  Service: General;  Laterality: N/A;   ENDOSCOPIC RETROGRADE CHOLANGIOPANCREATOGRAPHY (ERCP) WITH PROPOFOL N/A 06/06/2016   Procedure: ENDOSCOPIC RETROGRADE CHOLANGIOPANCREATOGRAPHY (ERCP) WITH PROPOFOL;  Surgeon: Clarene Essex, MD;   Location: [facility] ENDOSCOPY;  Service: Endoscopy;  Laterality: N/A;   ENDOSCOPIC RETROGRADE CHOLANGIOPANCREATOGRAPHY (ERCP) WITH PROPOFOL N/A 02/19/2020   Procedure: ENDOSCOPIC RETROGRADE CHOLANGIOPANCREATOGRAPHY (ERCP) WITH PROPOFOL;  Surgeon: Jackquline Denmark, MD;  Location: [facility] ENDOSCOPY;  Service: Endoscopy;  Laterality: N/A;   ERCP N/A 03/09/2016   Procedure: ENDOSCOPIC RETROGRADE CHOLANGIOPANCREATOGRAPHY (ERCP);  Surgeon: Clarene Essex, MD;  Location: [facility] ENDOSCOPY;  Service: Endoscopy;  Laterality: N/A;   ERCP N/A 06/12/2018   Procedure: ENDOSCOPIC RETROGRADE CHOLANGIOPANCREATOGRAPHY (ERCP);  Surgeon: Clarene Essex, MD;  Location: [facility] ENDOSCOPY;  Service: Endoscopy;  Laterality: N/A;   ESOPHAGOGASTRODUODENOSCOPY N/A 06/12/2018   Procedure: ESOPHAGOGASTRODUODENOSCOPY (EGD);  Surgeon: Clarene Essex, MD;  Location: [facility] ENDOSCOPY;  Service: Endoscopy;  Laterality: N/A;   laser vein surgery     last year   REMOVAL OF STONES  06/12/2018   Procedure: REMOVAL OF STONES;  Surgeon: Clarene Essex, MD;  Location: [facility] ENDOSCOPY;  Service: Endoscopy;;   REMOVAL OF STONES  02/19/2020   Procedure: REMOVAL OF STONES;  Surgeon: Jackquline Denmark, MD;  Location: [facility] ENDOSCOPY;  Service: Endoscopy;;   SPHINCTEROTOMY  06/12/2018   Procedure: Joan Mayans;  Surgeon: Clarene Essex, MD;  Location: [facility] ENDOSCOPY;  Service: Endoscopy;;   SPHINCTEROTOMY  02/19/2020   Procedure: Joan Mayans;  Surgeon: Jackquline Denmark, MD;  Location[facility] ENDOSCOPY;  Service: Endoscopy;;   TUBAL LIGATION     VARICOSE VEIN SURGERY Left     Prior to Admission medications   Medication Sig Start Date End Date Taking? Authorizing Provider  acetaminophen (TYLENOL) 500 MG tablet Take 500 mg by mouth every 6 (six) hours as needed for mild pain or moderate pain.   Yes [provider]  Calcium Carbonate-Vit D-Min (CALCIUM 1200 PO) Take 1 tablet by mouth daily.   Yes [provider]  cetirizine (ZYRTEC) 10 MG tablet Take 10 mg by mouth daily as needed  for allergies.   Yes [provider]  famotidine (PEPCID) 20 MG tablet TAKE 1 TABLET BY MOUTH EVERYDAY AT BEDTIME Patient taking differently: Take 20 mg by mouth daily. TAKE 1 TABLET BY MOUTH EVERYDAY AT BEDTIME 04/19/21  Yes Cirigliano, Vito V, DO  Multiple Vitamin (MULTIVITAMIN WITH MINERALS) TABS tablet Take 1 tablet by mouth daily with lunch.   Yes [provider]  omeprazole (PRILOSEC) 40 MG capsule TAKE 1 CAPSULE BY MOUTH EVERY DAY Patient taking differently: Take 40 mg by mouth daily. TAKE 1 CAPSULE BY MOUTH EVERY DAY 08/31/21  Yes Cirigliano, Vito V, DO  Probiotic Product (PROBIOTIC PO) Take 1 tablet by mouth daily.   Yes [provider]  ursodiol (ACTIGALL) 250 MG tablet TAKE 1 TABLET IN THE MORNING AND AT BEDTIME. (MAY TAKE 1 TWICE A DAY OR 2 TAB(S) ONCE DAILY. Patient taking differently: Take 250 mg by mouth 2 (two) times daily. 04/19/21  Yes Cirigliano, Vito V, DO  alendronate (FOSAMAX) 70 MG tablet Take 70 mg by mouth once a week. Take with a full glass of water on an empty stomach.  Patient not taking: Reported on 09/22/2021    [provider]  ALPRAZolam Duanne Moron) 0.25 MG tablet Take 1 tablet (0.25 mg total) by mouth daily as needed for anxiety. 10/06/21   Nche, Charlene Brooke, NP  pravastatin (PRAVACHOL) 20 MG tablet Take 1 tablet (20 mg total) by mouth at bedtime. 10/06/21   Nche, Charlene Brooke, NP    Scheduled Meds:  Infusions:  PRN Meds:    Allergies as of 10/14/2021 - Review Complete 10/14/2021  Allergen Reaction Noted   Oxycodone Nausea And Vomiting and Other (See Comments) 06/04/2016   Potassium Itching 02/23/2020    Family History  Problem Relation Age of Onset   Memory loss Mother    Ovarian cancer Mother    Colon polyps Neg Hx    Colon cancer Neg Hx     Social History   Socioeconomic History   Marital status: Married    Spouse name: Nathaneil Canary   Number of children: 4   Years of education: Not on file   Highest education level:  Not on file  Occupational History   Not on file  Tobacco Use   Smoking status: Former    Packs/day: 0.50    Years: 35.00    Pack years: 17.50    Types: Cigarettes   Smokeless tobacco: Never   Tobacco comments:    No smoking since 02/2016- "wearing Nicotine patch"  Vaping Use   Vaping Use: Never used  Substance and Sexual Activity   Alcohol use: No    Alcohol/week: 0.0 standard drinks   Drug use: No   Sexual activity: Not Currently  Other Topics Concern   Not on file  Social History Narrative   Not on file   Social Determinants of Health   Financial Resource Strain: Low Risk    Difficulty of Paying Living Expenses: Not hard at all  Food Insecurity: No Food Insecurity   Worried About Charity fundraiser in the Last Year: Never true   [place] in the Last Year: Never true  Transportation Needs: No Transportation Needs   Lack of Transportation (Medical): No   Lack of Transportation (Non-Medical): No  Physical Activity: Sufficiently Active   Days of Exercise per Week: 7 days   Minutes of Exercise per Session: 30 min  Stress: No Stress Concern Present   Feeling of Stress : Only a little  Social Connections: Moderately Isolated   Frequency of Communication with Friends and Family: More than three times a week   Frequency of Social Gatherings with Friends and Family: Twice a week   Attends Religious Services: Never   Marine scientist or Organizations: No   Attends Music therapist: Never   Marital Status: Married  Human resources officer Violence: Not At Risk   Fear of Current or Ex-Partner: No   Emotionally Abused: No   Physically Abused: No   Sexually Abused: No    REVIEW OF SYSTEMS: Constitutional: Feels somewhat tired and fatigued but nothing profound. ENT:  No nose bleeds.  Patient is set for full dental extractions in the coming weeks.  She says that her reflux disease caused dental decay. Pulm: No shortness of breath or cough. CV:  No  palpitations, no LE edema.  No angina GU: Urine can get dark with these episodes.  No hematuria, no frequency GI: See HPI. Heme: No unusual bleeding or bruising Transfusions: None Neuro:  No headaches, no peripheral tingling or numbness.  No syncope, no seizures. Derm:  No  itching, no rash or sores.  Endocrine:  No sweats or chills.  No polyuria or dysuria Immunization: Reviewed. Travel:  Not worried.   PHYSICAL EXAM: Vital signs in last 24 hours: Vitals:   10/14/21 0804 10/14/21 0838  BP: (!) 143/83 135/75  Pulse: 83 91  Resp: 20 16  Temp:    SpO2: 99% 95%   Wt Readings from Last 3 Encounters:  10/14/21 61 kg  10/06/21 55.2 kg  09/22/21 56 kg    General: Pleasant, currently comfortable after pain meds.  Does not look toxic or ill.  Not jaundiced. Head: No facial asymmetry or swelling.  No signs of head trauma. Eyes: No scleral icterus or conjunctival pallor. Ears: No hearing deficit Nose: No congestion or discharge Mouth: Fair dentition, some teeth are missing.  Tongue midline.  Mucosa moist, pink, clear. Neck: No JVD, no masses, no thyromegaly Lungs: Clear bilaterally.  No labored breathing or cough. Heart: RRR.  No MRG.  S1, S2 present Abdomen: Not distended.  Soft.  Active bowel sounds.  Currently not tender.  No HSM, masses, bruits, hernias..   Rectal: Deferred Musc/Skeltl: No gross joint deformities or swelling. Extremities: No CCE. Neurologic: Oriented x3.  Moves all 4 limbs with no deficits.  Strength not tested. Skin: No jaundice, no rash, no sores, no telangiectasia. Nodes: No cervical adenopathy Psych: Calm, pleasant, cooperative.  Good historian.  Intake/Output from previous day: 02/22 0701 - 02/23 0700 In: 949.1 [IV Piggyback:949.1] Out: -  Intake/Output this shift: No intake/output data recorded.  LAB RESULTS: Recent Labs    10/14/21 0420  WBC 7.0  HGB 13.4  HCT 40.7  PLT 168   BMET Lab Results  Component Value Date   NA 139 10/14/2021    NA 139 08/20/2021   NA 137 03/03/2021   K 3.7 10/14/2021   K 4.1 08/20/2021   K 3.7 03/03/2021   CL 104 10/14/2021   CL 100 08/20/2021   CL 102 03/03/2021   CO2 24 10/14/2021   CO2 26 08/20/2021   CO2 25 03/03/2021   GLUCOSE 131 (H) 10/14/2021   GLUCOSE 96 08/20/2021   GLUCOSE 115 (H) 03/03/2021   BUN 9 10/14/2021   BUN 15 08/20/2021   BUN 10 03/03/2021   CREATININE 0.84 10/14/2021   CREATININE 0.90 09/22/2021   CREATININE 0.86 08/20/2021   CALCIUM 8.7 (L) 10/14/2021   CALCIUM 9.2 08/20/2021   CALCIUM 8.3 (L) 03/03/2021   LFT Recent Labs    10/14/21 0626  PROT 6.6  ALBUMIN 3.2*  AST 195*  ALT 239*  ALKPHOS 170*  BILITOT 2.8*  BILIDIR 1.5*  IBILI 1.3*   PT/INR Lab Results  Component Value Date   INR 0.9 10/14/2021   INR 1.2 02/18/2020   INR 1.12 06/06/2016   Hepatitis Panel No results for input(s): HEPBSAG, HCVAB, HEPAIGM, HEPBIGM in the last 72 hours. C-Diff No components found for: CDIFF Lipase     Component Value Date/Time   LIPASE 29 10/14/2021 0626    Drugs of Abuse     Component Value Date/Time   LABOPIA NONE DETECTED 03/04/2016 0650   COCAINSCRNUR NONE DETECTED 03/04/2016 0650   LABBENZ NONE DETECTED 03/04/2016 0650   AMPHETMU NONE DETECTED 03/04/2016 0650   THCU NONE DETECTED 03/04/2016 0650   LABBARB POSITIVE (A) 03/04/2016 0650     RADIOLOGY STUDIES: DG Chest 2 View  Result Date: 10/14/2021 CLINICAL DATA:  66 year old female with history of chest pain. EXAM: CHEST - 2 VIEW COMPARISON:  Chest x-ray  08/20/2021. FINDINGS: Lung volumes are increased with mild emphysematous changes. No consolidative airspace disease. No pleural effusions. No pneumothorax. No pulmonary nodule or mass noted. Pulmonary vasculature and the cardiomediastinal silhouette are within normal limits. Atherosclerosis in the thoracic aorta. IMPRESSION: 1.  No radiographic evidence of acute cardiopulmonary disease. 2. Aortic atherosclerosis. 3. Emphysema. Electronically  Signed   By: Vinnie Langton M.D.   On: 10/14/2021 05:44   CT ABDOMEN PELVIS W CONTRAST  Result Date: 10/14/2021 CLINICAL DATA:  Epigastric pain EXAM: CT ABDOMEN AND PELVIS WITH CONTRAST TECHNIQUE: Multidetector CT imaging of the abdomen and pelvis was performed using the standard protocol following bolus administration of intravenous contrast. RADIATION DOSE REDUCTION: This exam was performed according to the departmental dose-optimization program which includes automated exposure control, adjustment of the mA and/or kV according to patient size and/or use of iterative reconstruction technique. CONTRAST:  144m OMNIPAQUE IOHEXOL 300 MG/ML  SOLN COMPARISON:  09/22/2021 abdominal CT and abdomen and pelvis CT 02/18/2020 FINDINGS: Lower chest:  No contributory findings. Hepatobiliary: No focal liver abnormality.Marked intra and extrahepatic biliary dilatation with scattered pneumobilia that is chronic. There is transition to a normal diameter or even collapse caliber at the level of the pancreatic head, presumed stricture. Cholecystectomy. No detected biliary inflammation or stone. Pancreas: Unremarkable. Spleen: Unremarkable. Adrenals/Urinary Tract: Negative adrenals. No hydronephrosis or stone. Unremarkable bladder. Stomach/Bowel: No obstruction. No appendicitis. Formed stool throughout much of the colon with fecalized appearance of terminal ileal contents. Left colonic diverticulosis. Vascular/Lymphatic: No acute vascular abnormality. Enlarged lymph nodes in the deep liver drainage considered reactive in isolation. Reproductive:4.6 cm right ovarian cyst, previously 3.6 cm. Because this lesion is not adequately characterized, prompt [place] recommended for further evaluation. Note: This recommendation does not apply to premenarchal patients and to those with increased risk (genetic, family history, elevated tumor markers or other high-risk factors) of ovarian cancer. Reference: JACR 2020 Feb; 17(2):248-254  Multiple clips at the bilateral adnexa. Other: No ascites or pneumoperitoneum. Musculoskeletal: No acute abnormalities. Lumbar spine degeneration and mild scoliosis. IMPRESSION: 1. No acute finding. 2. 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 3. 4.5 cm right ovarian cyst which has enlarged by 1 cm since 2021, recommend gynecology follow-up for pelvic ultrasound. Electronically Signed   By: JJorje GuildM.D.   On: 10/14/2021 06:04      IMPRESSION:   Episodes of epigastric pain, fevers plus minus nausea vomiting in pt w hx recurrent choledocholithiasis.  Current CT imaging suggests CBD stricture.  LFTs are now higher than they have ever been.    R ovarian cyst.  Rads rec for pelvic [place] gyn input.      PLAN:       MRI/MRCP ordered.  Will need ERCP as well.  Going to put orders in depot    Start Zosyn.     SAzucena Freed 10/14/2021, 8:45 AM Phone [phone]   Attending physician's note   I have taken history, reviewed the chart and examined the patient. I performed a substantive portion of this encounter, including complete performance of at least one of the key components, in conjunction with the APP. I agree with the Advanced Practitioner's note, impression and recommendations.   Recurrent choledocholithiasis in significantly dilated CBD with rising LFTs. No ascending cholangitis or pancreatitis. S/P cholecystectomy in remote past. No CBD strictures noted on MRCP.  Plan: -ERCP in AM with spy, +/- EHL -IV Zosyn d/t H/O fever -Trend CBC, CMP -  Check CA 19-9, CEA    I have explained risks and benefits including small but definite risks of pancreatitis (<10%), bleeding (<1%), perforation (<1%).  The risks of general anesthesia were also discussed.  The benefits were also discussed.  Patient wishes to proceed.  All the questions were answered.    Carmell Austria, MD [facility] [phone]

## 2021-10-15 ENCOUNTER — Encounter (HOSPITAL_COMMUNITY)
Admission: EM | Disposition: A | Discharge status: Home / Self Care | Payer: Self-pay | Source: Home / Self Care | Attending: Internal Medicine

## 2021-10-15 ENCOUNTER — Observation Stay (HOSPITAL_COMMUNITY): Payer: Medicare HMO

## 2021-10-15 ENCOUNTER — Observation Stay (HOSPITAL_COMMUNITY): Payer: Medicare HMO | Admitting: Registered Nurse

## 2021-10-15 DIAGNOSIS — K8031 Calculus of bile duct with cholangitis, unspecified, with obstruction: Secondary | ICD-10-CM | POA: Diagnosis present

## 2021-10-15 DIAGNOSIS — I2510 Atherosclerotic heart disease of native coronary artery without angina pectoris: Secondary | ICD-10-CM | POA: Diagnosis present

## 2021-10-15 DIAGNOSIS — K219 Gastro-esophageal reflux disease without esophagitis: Secondary | ICD-10-CM | POA: Diagnosis present

## 2021-10-15 DIAGNOSIS — K8051 Calculus of bile duct without cholangitis or cholecystitis with obstruction: Secondary | ICD-10-CM | POA: Diagnosis not present

## 2021-10-15 DIAGNOSIS — Z9049 Acquired absence of other specified parts of digestive tract: Secondary | ICD-10-CM | POA: Diagnosis not present

## 2021-10-15 DIAGNOSIS — Z79899 Other long term (current) drug therapy: Secondary | ICD-10-CM | POA: Diagnosis not present

## 2021-10-15 DIAGNOSIS — Z20822 Contact with and (suspected) exposure to covid-19: Secondary | ICD-10-CM | POA: Diagnosis present

## 2021-10-15 DIAGNOSIS — R1013 Epigastric pain: Secondary | ICD-10-CM | POA: Diagnosis present

## 2021-10-15 DIAGNOSIS — E876 Hypokalemia: Secondary | ICD-10-CM | POA: Diagnosis not present

## 2021-10-15 DIAGNOSIS — R079 Chest pain, unspecified: Secondary | ICD-10-CM | POA: Diagnosis present

## 2021-10-15 DIAGNOSIS — E782 Mixed hyperlipidemia: Secondary | ICD-10-CM | POA: Diagnosis present

## 2021-10-15 DIAGNOSIS — Z885 Allergy status to narcotic agent status: Secondary | ICD-10-CM | POA: Diagnosis not present

## 2021-10-15 DIAGNOSIS — Z8249 Family history of ischemic heart disease and other diseases of the circulatory system: Secondary | ICD-10-CM | POA: Diagnosis not present

## 2021-10-15 DIAGNOSIS — Z91048 Other nonmedicinal substance allergy status: Secondary | ICD-10-CM | POA: Diagnosis not present

## 2021-10-15 DIAGNOSIS — Z87891 Personal history of nicotine dependence: Secondary | ICD-10-CM | POA: Diagnosis not present

## 2021-10-15 DIAGNOSIS — K649 Unspecified hemorrhoids: Secondary | ICD-10-CM | POA: Diagnosis present

## 2021-10-15 DIAGNOSIS — Z7983 Long term (current) use of bisphosphonates: Secondary | ICD-10-CM | POA: Diagnosis not present

## 2021-10-15 DIAGNOSIS — N83201 Unspecified ovarian cyst, right side: Secondary | ICD-10-CM | POA: Diagnosis present

## 2021-10-15 DIAGNOSIS — K8050 Calculus of bile duct without cholangitis or cholecystitis without obstruction: Secondary | ICD-10-CM | POA: Diagnosis not present

## 2021-10-15 DIAGNOSIS — I700 Atherosclerosis of aorta: Secondary | ICD-10-CM | POA: Diagnosis present

## 2021-10-15 DIAGNOSIS — Z9851 Tubal ligation status: Secondary | ICD-10-CM | POA: Diagnosis not present

## 2021-10-15 HISTORY — PX: SPYGLASS CHOLANGIOSCOPY: SHX5441

## 2021-10-15 HISTORY — PX: BILIARY STENT PLACEMENT: SHX5538

## 2021-10-15 HISTORY — PX: SPYGLASS LITHOTRIPSY: SHX5537

## 2021-10-15 HISTORY — PX: ENDOSCOPIC RETROGRADE CHOLANGIOPANCREATOGRAPHY (ERCP) WITH PROPOFOL: SHX5810

## 2021-10-15 HISTORY — PX: BILIARY DILATION: SHX6850

## 2021-10-15 HISTORY — PX: REMOVAL OF STONES: SHX5545

## 2021-10-15 LAB — COMPREHENSIVE METABOLIC PANEL
ALT: 225 U/L — ABNORMAL HIGH (ref 0–44)
AST: 142 U/L — ABNORMAL HIGH (ref 15–41)
Albumin: 2.3 g/dL — ABNORMAL LOW (ref 3.5–5.0)
Alkaline Phosphatase: 152 U/L — ABNORMAL HIGH (ref 38–126)
Anion gap: 7 (ref 5–15)
BUN: 9 mg/dL (ref 8–23)
CO2: 24 mmol/L (ref 22–32)
Calcium: 8 mg/dL — ABNORMAL LOW (ref 8.9–10.3)
Chloride: 108 mmol/L (ref 98–111)
Creatinine, Ser: 0.71 mg/dL (ref 0.44–1.00)
GFR, Estimated: >60 mL/min (ref >60)
Glucose, Bld: 93 mg/dL (ref 70–99)
Potassium: 3.3 mmol/L — ABNORMAL LOW (ref 3.5–5.1)
Sodium: 139 mmol/L (ref 135–145)
Total Bilirubin: 3.9 mg/dL — ABNORMAL HIGH (ref 0.3–1.2)
Total Protein: 5.1 g/dL — ABNORMAL LOW (ref 6.5–8.1)

## 2021-10-15 LAB — CBC
HCT: 31.6 % — ABNORMAL LOW (ref 36.0–46.0)
Hemoglobin: 10.5 g/dL — ABNORMAL LOW (ref 12.0–15.0)
MCH: 28.8 pg (ref 26.0–34.0)
MCHC: 33.2 g/dL (ref 30.0–36.0)
MCV: 86.8 fL (ref 80.0–100.0)
Platelets: 132 10*3/uL — ABNORMAL LOW (ref 150–400)
RBC: 3.64 MIL/uL — ABNORMAL LOW (ref 3.87–5.11)
RDW: 14.3 % (ref 11.5–15.5)
WBC: 6.3 10*3/uL (ref 4.0–10.5)
nRBC: 0 % (ref 0.0–0.2)

## 2021-10-15 LAB — MAGNESIUM: Magnesium: 2 mg/dL (ref 1.7–2.4)

## 2021-10-15 SURGERY — ENDOSCOPIC RETROGRADE CHOLANGIOPANCREATOGRAPHY (ERCP) WITH PROPOFOL
Anesthesia: General

## 2021-10-15 MED ORDER — PROPOFOL 10 MG/ML IV BOLUS
INTRAVENOUS | Status: DC | PRN
Start: 2021-10-15 — End: 2021-10-15
  Administered 2021-10-15: 150 mg via INTRAVENOUS

## 2021-10-15 MED ORDER — ACETAMINOPHEN 500 MG PO TABS
1000.0000 mg | ORAL_TABLET | Freq: Four times a day (QID) | ORAL | Status: AC
  Administered 2021-10-16: 1000 mg via ORAL
  Filled 2021-10-15 (×4): qty 2

## 2021-10-15 MED ORDER — POTASSIUM CHLORIDE 10 MEQ/100ML IV SOLN: 10.0000 meq | INTRAVENOUS | Status: DC

## 2021-10-15 MED ORDER — LIDOCAINE 2% (20 MG/ML) 5 ML SYRINGE
INTRAMUSCULAR | Status: DC | PRN
Start: 2021-10-15 — End: 2021-10-15
  Administered 2021-10-15: 100 mg via INTRAVENOUS

## 2021-10-15 MED ORDER — INDOMETHACIN 50 MG RE SUPP
100.0000 mg | Freq: Once | RECTAL | Status: AC
  Administered 2021-10-15: 100 mg via RECTAL

## 2021-10-15 MED ORDER — DEXAMETHASONE SODIUM PHOSPHATE 10 MG/ML IJ SOLN
INTRAMUSCULAR | Status: DC | PRN
  Administered 2021-10-15: 10 mg via INTRAVENOUS

## 2021-10-15 MED ORDER — INDOMETHACIN 50 MG RE SUPP
RECTAL | Status: AC
  Filled 2021-10-15: qty 2

## 2021-10-15 MED ORDER — SODIUM CHLORIDE 0.9 % IV SOLN
INTRAVENOUS | Status: DC | PRN
  Administered 2021-10-15: 100 mL

## 2021-10-15 MED ORDER — GLUCAGON HCL RDNA (DIAGNOSTIC) 1 MG IJ SOLR
INTRAMUSCULAR | Status: AC
  Filled 2021-10-15: qty 1

## 2021-10-15 MED ORDER — GLUCAGON HCL RDNA (DIAGNOSTIC) 1 MG IJ SOLR
INTRAMUSCULAR | Status: DC | PRN
  Administered 2021-10-15 (×2): .25 mg via INTRAVENOUS

## 2021-10-15 MED ORDER — LACTATED RINGERS IV SOLN: INTRAVENOUS | Status: DC | PRN

## 2021-10-15 MED ORDER — INDOMETHACIN 50 MG RE SUPP
RECTAL | Status: DC | PRN
  Administered 2021-10-15: 100 mg via RECTAL

## 2021-10-15 MED ORDER — SUGAMMADEX SODIUM 200 MG/2ML IV SOLN
INTRAVENOUS | Status: DC | PRN
  Administered 2021-10-15: 200 mg via INTRAVENOUS

## 2021-10-15 MED ORDER — ACETAMINOPHEN 10 MG/ML IV SOLN: 1000.0000 mg | Freq: Once | INTRAVENOUS | Status: AC

## 2021-10-15 MED ORDER — ROCURONIUM BROMIDE 10 MG/ML (PF) SYRINGE
PREFILLED_SYRINGE | INTRAVENOUS | Status: DC | PRN
  Administered 2021-10-15: 50 mg via INTRAVENOUS

## 2021-10-15 MED ORDER — ACETAMINOPHEN 10 MG/ML IV SOLN: 1000.0000 mg | Freq: Four times a day (QID) | INTRAVENOUS | Status: DC

## 2021-10-15 MED ORDER — PHENYLEPHRINE HCL (PRESSORS) 10 MG/ML IV SOLN
INTRAVENOUS | Status: DC | PRN
  Administered 2021-10-15: 120 ug via INTRAVENOUS

## 2021-10-15 MED ORDER — ONDANSETRON HCL 4 MG/2ML IJ SOLN
INTRAMUSCULAR | Status: DC | PRN
  Administered 2021-10-15: 4 mg via INTRAVENOUS

## 2021-10-15 NOTE — Anesthesia Postprocedure Evaluation (Signed)
Anesthesia Post Note  Patient: Jasmine Bradford  Procedure(s) Performed: ENDOSCOPIC RETROGRADE CHOLANGIOPANCREATOGRAPHY (ERCP) WITH PROPOFOL SPYGLASS LITHOTRIPSY REMOVAL OF STONES BILIARY STENT PLACEMENT BILIARY DILATION     Patient location during evaluation: PACU Anesthesia Type: General Level of consciousness: awake Pain management: pain level controlled Vital Signs Assessment: post-procedure vital signs reviewed and stable Respiratory status: spontaneous breathing Cardiovascular status: stable Postop Assessment: no apparent nausea or vomiting Anesthetic complications: no   No notable events documented.  Last Vitals:  Vitals:   10/15/21 1415 10/15/21 1420  BP: (!) 115/59 (!) 114/56  Pulse: 81 78  Resp: 16 16  Temp:    SpO2: 93% 94%    Last Pain:  Vitals:   10/15/21 1405  TempSrc:   PainSc: 0-No pain                 Huston Foley

## 2021-10-15 NOTE — Progress Notes (Signed)
PROGRESS NOTE    Jasmine Bradford  QMV:[id number] DOB: 11/01/1955 DOA: 10/14/2021 PCP: Flossie Buffy, NP   Brief Narrative: 66 year old female with history of cholecystectomy in 2017, hyperlipidemia and anxiety admitted with recurrent abdominal pain.  Followed by Dr. Bryan Lemma as an outpatient was planning for MRCP on 10/17/2021 but patient could not tolerate pain so she came to the ED.  CT scan on 09/22/2021 was noted to have intra and extrahepatic ductal dilatation with possible proximal CBD stricture. She was hospitalized in 2021 with elevated LFTs hyperbilirubinemia choledocholithiasis and Klebsiella bacteremia she underwent ERCP with biliary sphincterotomy and sphincteroplasty followed by stone extraction and was treated with Zosyn at that time. Assessment & Plan:   Principal Problem:   Choledocholithiasis Active Problems:   Abdominal pain   Elevated LFTs and hyperbilirubinemia   GERD (gastroesophageal reflux disease)   Epigastric abdominal pain with nausea and vomiting   Mixed hyperlipidemia   Epigastric pain   Ovarian cyst   Hyperbilirubinemia    #1 recurrent choledocholithiasis with dilated CBD history of cholecystectomy in 2017. MRCP 10/14/2021 progressive biliary duct dilatation since June 2021 secondary to abdominal and 15 mm mid to distal common duct stone.  Mild biliary ductal enhancement in the setting of obstruction is nonspecific. Patient placed on Zosyn and for ERCP today. On Actigall at home  #2 hypokalemia patient reports she is allergic to IV or p.o. potassium and refuses to take any replacement prior to ERCP.  #3 hyperlipidemia on statin at home hold due to elevated LFTs  #4 history of GERD on Protonix  #5 enlarged ovarian cyst follow-up with GYN  #6 osteoporosis on Fosamax which is on hold    Estimated body mass index is 25.41 kg/m as calculated from the following:   Height as of this encounter: 5\' 1"  (1.549 m).   Weight as of this encounter:  61 kg.  DVT prophylaxis: None Code Status full code Family Communication: None at bedside  disposition Plan:  Status is: Inpatient Remains inpatient appropriate because: Elevated LFTs for ERCP on IV antibiotics   Consultants:  GI  Procedures: MRCP and ERCP  antimicrobials: Zosyn  Subjective: She is resting in bed in no acute distress she denies any abdominal pain nausea vomiting LFTs remain elevated scheduled for ERCP today Potassium noted to be low but patient does not want to take potassium IV or p.o. due to severe rash she gets with potassium replacement I explained to her that it is important to maintain potassium above 4 due to arrhythmias however she refused to take any  Objective: Vitals:   10/14/21 2322 10/15/21 0517 10/15/21 0807 10/15/21 1030  BP: (!) 90/59 (!) 92/58 101/65 119/66  Pulse: 73 77 73 72  Resp:  17 17 16   Temp: 98.1 F (36.7 C) 98.8 F (37.1 C) 98.4 F (36.9 C) 97.8 F (36.6 C)  TempSrc: Oral Oral Oral Temporal  SpO2: 97% 99% 97% 98%  Weight:      Height:        Intake/Output Summary (Last 24 hours) at 10/15/2021 1343 Last data filed at 10/15/2021 0603 Gross per 24 hour  Intake 988.51 ml  Output --  Net 988.51 ml   Filed Weights   10/14/21 0418  Weight: 61 kg    Examination:  General exam: Appears calm and comfortable  Respiratory system: Clear to auscultation. Respiratory effort normal. Cardiovascular system: S1 & S2 heard, RRR. No JVD, murmurs, rubs, gallops or clicks. No pedal edema. Gastrointestinal system: Abdomen is nondistended, soft  and nontender. No organomegaly or masses felt. Normal bowel sounds heard. Central nervous system: Alert and oriented. No focal neurological deficits. Extremities: Symmetric 5 x 5 power. Skin: No rashes, lesions or ulcers Psychiatry: Judgement and insight appear normal. Mood & affect appropriate.     Data Reviewed: I have personally reviewed following labs and imaging studies  CBC: Recent Labs  Lab  10/14/21 0420 10/15/21 0146  WBC 7.0 6.3  HGB 13.4 10.5*  HCT 40.7 31.6*  MCV 87.0 86.8  PLT 168 195*   Basic Metabolic Panel: Recent Labs  Lab 10/14/21 0420 10/15/21 0146 10/15/21 0718  NA 139 139  --   K 3.7 3.3*  --   CL 104 108  --   CO2 24 24  --   GLUCOSE 131* 93  --   BUN 9 9  --   CREATININE 0.84 0.71  --   CALCIUM 8.7* 8.0*  --   MG  --   --  2.0   GFR: Estimated Creatinine Clearance: 57.2 mL/min (by C-G formula based on SCr of 0.71 mg/dL). Liver Function Tests: Recent Labs  Lab 10/14/21 0626 10/15/21 0146  AST 195* 142*  ALT 239* 225*  ALKPHOS 170* 152*  BILITOT 2.8* 3.9*  PROT 6.6 5.1*  ALBUMIN 3.2* 2.3*   Recent Labs  Lab 10/14/21 0626  LIPASE 29   No results for input(s): AMMONIA in the last 168 hours. Coagulation Profile: Recent Labs  Lab 10/14/21 0418  INR 0.9   Cardiac Enzymes: No results for input(s): CKTOTAL, CKMB, CKMBINDEX, TROPONINI in the last 168 hours. BNP (last 3 results) No results for input(s): PROBNP in the last 8760 hours. HbA1C: No results for input(s): HGBA1C in the last 72 hours. CBG: No results for input(s): GLUCAP in the last 168 hours. Lipid Profile: No results for input(s): CHOL, HDL, LDLCALC, TRIG, CHOLHDL, LDLDIRECT in the last 72 hours. Thyroid Function Tests: No results for input(s): TSH, T4TOTAL, FREET4, T3FREE, THYROIDAB in the last 72 hours. Anemia Panel: No results for input(s): VITAMINB12, FOLATE, FERRITIN, TIBC, IRON, RETICCTPCT in the last 72 hours. Sepsis Labs: No results for input(s): PROCALCITON, LATICACIDVEN in the last 168 hours.  Recent Results (from the past 240 hour(s))  Resp Panel by RT-PCR (Flu A&B, Covid) Nasopharyngeal Swab     Status: None   Collection Time: 10/14/21 10:03 AM   Specimen: Nasopharyngeal Swab; Nasopharyngeal(NP) swabs in vial transport medium  Result Value Ref Range Status   SARS Coronavirus 2 by RT PCR NEGATIVE NEGATIVE Final    Comment: (NOTE) SARS-CoV-2 target  nucleic acids are NOT DETECTED.  The SARS-CoV-2 RNA is generally detectable in upper respiratory specimens during the acute phase of infection. The lowest concentration of SARS-CoV-2 viral copies this assay can detect is 138 copies/mL. A negative result does not preclude SARS-Cov-2 infection and should not be used as the sole basis for treatment or other patient management decisions. A negative result may occur with  improper specimen collection/handling, submission of specimen other than nasopharyngeal swab, presence of viral mutation(s) within the areas targeted by this assay, and inadequate number of viral copies(<138 copies/mL). A negative result must be combined with clinical observations, patient history, and epidemiological information. The expected result is Negative.  Fact Sheet for Patients:  EntrepreneurPulse.com.au  Fact Sheet for Healthcare Providers:  IncredibleEmployment.be  This test is no t yet approved or cleared by the [place] FDA and  has been authorized for detection and/or diagnosis of SARS-CoV-2 by FDA under an Emergency Use  Authorization (EUA). This EUA will remain  in effect (meaning this test can be used) for the duration of the COVID-19 declaration under Section 564(b)(1) of the Act, 21 U.S.C.section 360bbb-3(b)(1), unless the authorization is terminated  or revoked sooner.       Influenza A by PCR NEGATIVE NEGATIVE Final   Influenza B by PCR NEGATIVE NEGATIVE Final    Comment: (NOTE) The Xpert Xpress SARS-CoV-2/FLU/RSV plus assay is intended as an aid in the diagnosis of influenza from Nasopharyngeal swab specimens and should not be used as a sole basis for treatment. Nasal washings and aspirates are unacceptable for Xpert Xpress SARS-CoV-2/FLU/RSV testing.  Fact Sheet for Patients: EntrepreneurPulse.com.au  Fact Sheet for Healthcare  Providers: IncredibleEmployment.be  This test is not yet approved or cleared by the [place] FDA and has been authorized for detection and/or diagnosis of SARS-CoV-2 by FDA under an Emergency Use Authorization (EUA). This EUA will remain in effect (meaning this test can be used) for the duration of the COVID-19 declaration under Section 564(b)(1) of the Act, 21 U.S.C. section 360bbb-3(b)(1), unless the authorization is terminated or revoked.  Performed at [facility], [place] [address], [place], [place] [address]          Radiology Studies: DG Chest 2 View  Result Date: 10/14/2021 CLINICAL DATA:  65 year old female with history of chest pain. EXAM: CHEST - 2 VIEW COMPARISON:  Chest x-ray 08/20/2021. FINDINGS: Lung volumes are increased with mild emphysematous changes. No consolidative airspace disease. No pleural effusions. No pneumothorax. No pulmonary nodule or mass noted. Pulmonary vasculature and the cardiomediastinal silhouette are within normal limits. Atherosclerosis in the thoracic aorta. IMPRESSION: 1.  No radiographic evidence of acute cardiopulmonary disease. 2. Aortic atherosclerosis. 3. Emphysema. Electronically Signed   By: Vinnie Langton M.D.   On: 10/14/2021 05:44   CT ABDOMEN PELVIS W CONTRAST  Result Date: 10/14/2021 CLINICAL DATA:  Epigastric pain EXAM: CT ABDOMEN AND PELVIS WITH CONTRAST TECHNIQUE: Multidetector CT imaging of the abdomen and pelvis was performed using the standard protocol following bolus administration of intravenous contrast. RADIATION DOSE REDUCTION: This exam was performed according to the departmental dose-optimization program which includes automated exposure control, adjustment of the mA and/or kV according to patient size and/or use of iterative reconstruction technique. CONTRAST:  114mL OMNIPAQUE IOHEXOL 300 MG/ML  SOLN COMPARISON:  09/22/2021 abdominal CT and abdomen and pelvis CT 02/18/2020 FINDINGS: Lower chest:   No contributory findings. Hepatobiliary: No focal liver abnormality.Marked intra and extrahepatic biliary dilatation with scattered pneumobilia that is chronic. There is transition to a normal diameter or even collapse caliber at the level of the pancreatic head, presumed stricture. Cholecystectomy. No detected biliary inflammation or stone. Pancreas: Unremarkable. Spleen: Unremarkable. Adrenals/Urinary Tract: Negative adrenals. No hydronephrosis or stone. Unremarkable bladder. Stomach/Bowel: No obstruction. No appendicitis. Formed stool throughout much of the colon with fecalized appearance of terminal ileal contents. Left colonic diverticulosis. Vascular/Lymphatic: No acute vascular abnormality. Enlarged lymph nodes in the deep liver drainage considered reactive in isolation. Reproductive:4.6 cm right ovarian cyst, previously 3.6 cm. Because this lesion is not adequately characterized, prompt [place] is recommended for further evaluation. Note: This recommendation does not apply to premenarchal patients and to those with increased risk (genetic, family history, elevated tumor markers or other high-risk factors) of ovarian cancer. Reference: JACR 2020 Feb; 17(2):248-254 Multiple clips at the bilateral adnexa. Other: No ascites or pneumoperitoneum. Musculoskeletal: No acute abnormalities. Lumbar spine degeneration and mild scoliosis. IMPRESSION: 1. No acute finding. 2. 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 3. 4.5 cm right ovarian cyst which has enlarged by 1 cm since 2021, recommend gynecology follow-up for pelvic ultrasound. Electronically Signed   By: Jorje Guild M.D.   On: 10/14/2021 06:04   MR ABDOMEN MRCP W WO CONTAST  Result Date: 10/14/2021 CLINICAL DATA:  Abdominal pain and fever. Elevated liver function tests. Question bile duct stricture. Cholecystectomy 5 years ago. EXAM: MRI ABDOMEN WITHOUT AND WITH CONTRAST (INCLUDING MRCP) TECHNIQUE:  Multiplanar multisequence MR imaging of the abdomen was performed both before and after the administration of intravenous contrast. Heavily T2-weighted images of the biliary and pancreatic ducts were obtained, and three-dimensional MRCP images were rendered by post processing. CONTRAST:  61mL GADAVIST GADOBUTROL 1 MMOL/ML IV SOLN COMPARISON:  Today's CT and an MRI 02/18/2020. FINDINGS: Lower chest: Normal heart size without pericardial or pleural effusion. Hepatobiliary: Heterogeneous arterial phase enhancement is likely related to bile duct obstruction. No suspicious liver lesion. Lesions status post cholecystectomy. Moderate intrahepatic biliary duct dilatation. The common duct measures 15 mm in the porta hepatis on 18/4 versus 12 mm on the 02/18/2020 MRI. Followed to the level of a mid to distal common duct stone, including at 1.3 cm on 16/4, 1.5 cm on 18/7, and 1.5 cm on 30/13. This is at the site of smaller stones on the 02/18/2020 MRI. There is suggestion of hyperenhancement involving the biliary ducts. Example coronal image 42/28. Pancreas:  Normal, without mass or ductal dilatation. Spleen:  Normal in size, without focal abnormality. Adrenals/Urinary Tract: Normal adrenal glands. Normal kidneys, without hydronephrosis. Stomach/Bowel: Proximal gastric underdistention. Tiny hiatal hernia. Normal abdominal bowel loops. Vascular/Lymphatic: Aortic atherosclerosis. Patent portal and splenic veins. No abdominal adenopathy. Other:  No abdominal ascites. Musculoskeletal: No acute osseous abnormality. IMPRESSION: 1. Progressive biliary duct dilatation since the MRI of 02/18/2020 secondary to a dominant 15 mm mid to distal common duct stone. Mild biliary ductal enhancement in the setting of obstruction is nonspecific. Recommend clinical correlation for superimposed cholangitis. 2.  Aortic Atherosclerosis (ICD10-I70.0). Electronically Signed   By: Abigail Miyamoto M.D.   On: 10/14/2021 13:08        Scheduled Meds:   [MAR Hold] acetaminophen  1,000 mg Oral Q6H   [MAR Hold] acidophilus   Oral Daily   [MAR Hold] enoxaparin (LOVENOX) injection  40 mg Subcutaneous Q24H   [MAR Hold] famotidine  20 mg Oral QHS   indomethacin  100 mg Rectal Once   [MAR Hold] loratadine  10 mg Oral Daily   [MAR Hold] pantoprazole  40 mg Oral Daily   [MAR Hold] sodium chloride flush  3 mL Intravenous Q12H   [MAR Hold] ursodiol  300 mg Oral BID   Continuous Infusions:  sodium chloride 100 mL/hr at 10/15/21 0557   [MAR Hold] acetaminophen     [MAR Hold] piperacillin-tazobactam (ZOSYN)  IV 3.375 g (10/15/21 0558)     LOS: 0 days    Georgette Shell, MD 10/15/2021, 1:43 PM

## 2021-10-15 NOTE — Interval H&P Note (Signed)
History and Physical Interval Note:  10/15/2021 11:10 AM  Jasmine Bradford  has presented today for surgery, with the diagnosis of stricture of bile duct.  The various methods of treatment have been discussed with the patient and family. After consideration of risks, benefits and other options for treatment, the patient has consented to  Procedure(s): ENDOSCOPIC RETROGRADE CHOLANGIOPANCREATOGRAPHY (ERCP) WITH PROPOFOL (N/A) as a surgical intervention.  The patient's history has been reviewed, patient examined, no change in status, stable for surgery.  I have reviewed the patient's chart and labs.  Questions were answered to the patient's satisfaction.     Jackquline Denmark

## 2021-10-15 NOTE — Progress Notes (Signed)
POST ERCP NOTE:  Pt doing well. No abdominal pain or nausea or melena.  Had 1 episode of diarrhea (expectad) vitals stable. Abdominal Exam: soft, nontender.  Discussed with pt. I just missed patient's husband.  ERCP report given to patient.  Plan: -Advance diet to full liquid.  Heart healthy diet in AM. -D/C antibiotics in AM. -D/C home in AM  -FU GI clinic in 3-4 weeks.   Carmell Austria

## 2021-10-15 NOTE — Op Note (Signed)
[facility] Patient Name: Jasmine Bradford Procedure Date : 10/15/2021 MRN: [id number] Attending MD: Jackquline Denmark , MD Date of Birth: July 27, 1956 CSN: [id number] Age: 66 Admit Type: Inpatient Procedure:                ERCP Indications:              Recurrent bile duct stone(s) on MRCP with abn LFTs Providers:                Jackquline Denmark, MD, Grace Isaac, RN, Frazier Richards,                            Technician Referring MD:              Medicines:                General Anesthesia Complications:            No immediate complications. Estimated Blood Loss:     Estimated blood loss: none. Procedure:                Pre-Anesthesia Assessment:                           - Prior to the procedure, a History and Physical                            was performed, and patient medications and                            allergies were reviewed. The patient's tolerance of                            previous anesthesia was also reviewed. The risks                            and benefits of the procedure and the sedation                            options and risks were discussed with the patient.                            All questions were answered, and informed consent                            was obtained. Prior Anticoagulants: The patient has                            taken no previous anticoagulant or antiplatelet                            agents. ASA Grade Assessment: II - A patient with                            mild systemic disease. After reviewing the risks  and benefits, the patient was deemed in                            satisfactory condition to undergo the procedure.                           After obtaining informed consent, the scope was                            passed under direct vision. Throughout the                            procedure, the patient's blood pressure, pulse, and                            oxygen saturations were  monitored continuously. The                            [employer] single use                            duodenoscope was introduced through the mouth, and                            used to inject contrast into and used to inject                            contrast into the bile duct. The ERCP was                            accomplished without difficulty. The patient                            tolerated the procedure well. Scope In: Scope Out: Findings:      A scout film of the abdomen was obtained. Surgical clips, consistent       with a previous cholecystectomy, were seen in the area of the right       upper quadrant of the abdomen. The esophagus was successfully intubated       under direct vision. The scope was advanced to major papilla in the       descending duodenum without detailed examination of the pharynx, larynx       and associated structures, and upper GI tract. The upper GI tract was       grossly normal.      There was evidence of previous sphincterotomy. The bile duct was deeply       cannulated with the short-nosed traction sphincterotome. Contrast was       injected. Significantly dilated biliary tree with "sigmoid" appearing       CBD measuring 15 mm. Multiple filling defects, largest being oblong 15       mm x 20 mm c/w choledocholithiasis. Dilation of the common bile duct       (sphincteroplasty) with a 12-13.5-15 mm balloon (to a maximum balloon       size of 13.5 mm) dilator was successful. The biliary  tree was swept with       a 15 mm balloon starting at the bifurcation. Several small stones and       sludge was swept from the duct.      The largest stone could not be removed with 15 mm balloon. Spy scope was       inserted to the tri-ifurcation. R hepatic ducts (anterior and       posterior), left hepatic ducts insertion was normal. No strictures. A       large stone was noted. EHL was performed using low and medium power with        resultant multiple small pieces and sludge. CBD was swept with 15 mm       balloon several times with resultant extraction of significant sludge.      We decided to insert Endo biliary stent to help with drainage. One 10 Fr       by 7 cm plastic stent was placed 6 cm into the common bile duct. Green       bile flowed through the stent. The stent was in good position.      Pancreatic duct was intentionally not cannulated or injected. Indocin       suppositories were given. Impression:               - Choledocholithiasis s/p sphincteroplasty,                            spyglass with EHL and insertion of Endo biliary                            stent.                           - Previous cholecystectomy. Recommendation:           - Return patient to hospital ward for ongoing care.                           - Clear liquid diet.                           - Return to GI clinic in 4 weeks.                           - Recommend EUS (or rpt spy)/repeat ERCP with Dr.                            Rush Landmark in 6-8 weeks (to ensure complete removal                            of stones). Will discuss with him.                           - D/W family                           - Watch for pancreatitis, bleeding, perforation,  and cholangitis. Procedure Code(s):        --- Professional ---                           [id number], Endoscopic retrograde                            cholangiopancreatography (ERCP); with placement of                            endoscopic stent into biliary or pancreatic duct,                            including pre- and post-dilation and guide wire                            passage, when performed, including sphincterotomy,                            when performed, each stent                           [id number], Endoscopic retrograde                            cholangiopancreatography (ERCP); with destruction                            of calculi, any method (eg,  mechanical,                            electrohydraulic, lithotripsy)                           43273, Endoscopic cannulation of papilla with                            direct visualization of pancreatic/common bile                            duct(s) (List separately in addition to code(s) for                            primary procedure)                           85027, Endoscopic catheterization of the biliary                            ductal system, radiological supervision and                            interpretation Diagnosis Code(s):        --- Professional ---                           K80.51, Calculus of bile duct without cholangitis  or cholecystitis with obstruction CPT copyright 2019 American Medical Association. All rights reserved. The codes documented in this report are preliminary and upon coder review may  be revised to meet current compliance requirements. Jackquline Denmark, MD 10/15/2021 2:25:35 PM This report has been signed electronically. Number of Addenda: 0

## 2021-10-15 NOTE — Anesthesia Procedure Notes (Signed)
Procedure Name: Intubation Date/Time: 10/15/2021 11:33 AM Performed by: Geraldine Contras, CRNA Pre-anesthesia Checklist: Patient identified, Patient being monitored, Timeout performed, Emergency Drugs available and Suction available Patient Re-evaluated:Patient Re-evaluated prior to induction Oxygen Delivery Method: Circle system utilized Preoxygenation: Pre-oxygenation with 100% oxygen Induction Type: IV induction Ventilation: Mask ventilation without difficulty Laryngoscope Size: Mac and 3 Grade View: Grade I Tube type: Oral Tube size: 7.0 mm Number of attempts: 1 Airway Equipment and Method: Stylet Placement Confirmation: ETT inserted through vocal cords under direct vision, positive ETCO2 and breath sounds checked- equal and bilateral Secured at: 21 cm Tube secured with: Tape Dental Injury: Teeth and Oropharynx as per pre-operative assessment

## 2021-10-15 NOTE — Transfer of Care (Signed)
Immediate Anesthesia Transfer of Care Note  Patient: Jasmine Bradford  Procedure(s) Performed: ENDOSCOPIC RETROGRADE CHOLANGIOPANCREATOGRAPHY (ERCP) WITH PROPOFOL SPYGLASS LITHOTRIPSY REMOVAL OF STONES BILIARY STENT PLACEMENT BILIARY DILATION  Patient Location: PACU  Anesthesia Type:General  Level of Consciousness: awake  Airway & Oxygen Therapy: Patient Spontanous Breathing  Post-op Assessment: Report given to RN  Post vital signs: stable  Last Vitals:  Vitals Value Taken Time  BP    Temp    Pulse    Resp    SpO2      Last Pain:  Vitals:   10/15/21 1030  TempSrc: Temporal  PainSc: 0-No pain         Complications: No notable events documented.

## 2021-10-15 NOTE — Anesthesia Preprocedure Evaluation (Signed)
Anesthesia Evaluation  Patient identified by MRN, date of birth, ID band Patient awake    Reviewed: Allergy & Precautions, NPO status , Patient's Chart, lab work & pertinent test results  History of Anesthesia Complications Negative for: history of anesthetic complications  Airway Mallampati: II  TM Distance: >3 FB Neck ROM: Full    Dental  (+) Dental Advisory Given, Chipped   Pulmonary former smoker,  02/18/2020 SARS coronavirus NEG   breath sounds clear to auscultation       Cardiovascular (-) angina Rhythm:Regular Rate:Normal  '17 ECHO: EF 60-65%, valves OK   Neuro/Psych negative neurological ROS  negative psych ROS   GI/Hepatic GERD  Medicated and Controlled,(+) Hepatitis -, UnspecifiedCholelithiasis: elevated LFTs N/v with cholelithiasis   Endo/Other  negative endocrine ROS  Renal/GU      Musculoskeletal negative musculoskeletal ROS (+)   Abdominal Normal abdominal exam  (+)   Peds  Hematology  (+) Blood dyscrasia, anemia , Hb 11.8, plt 142k   Anesthesia Other Findings   Reproductive/Obstetrics                             Anesthesia Physical  Anesthesia Plan  ASA: 2  Anesthesia Plan: General   Post-op Pain Management:    Induction: Intravenous  PONV Risk Score and Plan: 3 and Ondansetron, Dexamethasone, Treatment may vary due to age or medical condition and Midazolam  Airway Management Planned: Oral ETT  Additional Equipment: None  Intra-op Plan:   Post-operative Plan: Extubation in OR  Informed Consent: I have reviewed the patients History and Physical, chart, labs and discussed the procedure including the risks, benefits and alternatives for the proposed anesthesia with the patient or authorized representative who has indicated his/her understanding and acceptance.     Dental advisory given  Plan Discussed with: CRNA  Anesthesia Plan Comments:          Anesthesia Quick Evaluation

## 2021-10-16 DIAGNOSIS — K8050 Calculus of bile duct without cholangitis or cholecystitis without obstruction: Secondary | ICD-10-CM | POA: Diagnosis not present

## 2021-10-16 LAB — COMPREHENSIVE METABOLIC PANEL
ALT: 145 U/L — ABNORMAL HIGH (ref 0–44)
AST: 41 U/L (ref 15–41)
Albumin: 2.2 g/dL — ABNORMAL LOW (ref 3.5–5.0)
Alkaline Phosphatase: 133 U/L — ABNORMAL HIGH (ref 38–126)
Anion gap: 8 (ref 5–15)
BUN: 13 mg/dL (ref 8–23)
CO2: 24 mmol/L (ref 22–32)
Calcium: 8.2 mg/dL — ABNORMAL LOW (ref 8.9–10.3)
Chloride: 108 mmol/L (ref 98–111)
Creatinine, Ser: 0.78 mg/dL (ref 0.44–1.00)
GFR, Estimated: >60 mL/min (ref >60)
Glucose, Bld: 102 mg/dL — ABNORMAL HIGH (ref 70–99)
Potassium: 3.6 mmol/L (ref 3.5–5.1)
Sodium: 140 mmol/L (ref 135–145)
Total Bilirubin: 0.9 mg/dL (ref 0.3–1.2)
Total Protein: 5.1 g/dL — ABNORMAL LOW (ref 6.5–8.1)

## 2021-10-16 LAB — CBC
HCT: 32 % — ABNORMAL LOW (ref 36.0–46.0)
Hemoglobin: 10.7 g/dL — ABNORMAL LOW (ref 12.0–15.0)
MCH: 29 pg (ref 26.0–34.0)
MCHC: 33.4 g/dL (ref 30.0–36.0)
MCV: 86.7 fL (ref 80.0–100.0)
Platelets: 146 10*3/uL — ABNORMAL LOW (ref 150–400)
RBC: 3.69 MIL/uL — ABNORMAL LOW (ref 3.87–5.11)
RDW: 14.4 % (ref 11.5–15.5)
WBC: 6.9 10*3/uL (ref 4.0–10.5)
nRBC: 0 % (ref 0.0–0.2)

## 2021-10-16 MED ORDER — PHENOL 1.4 % MT LIQD
1.0000 | OROMUCOSAL | Status: DC | PRN
  Filled 2021-10-16: qty 177

## 2021-10-16 MED ORDER — ONDANSETRON HCL 4 MG PO TABS: 4.0000 mg | ORAL_TABLET | Freq: Four times a day (QID) | ORAL | 0 refills | Status: DC | PRN

## 2021-10-16 NOTE — Discharge Summary (Signed)
Physician Discharge Summary   Patient: Jasmine Bradford MRN: [id number] DOB: 01-19-1956  Admit date:     10/14/2021  Discharge date: 10/16/21  Discharge Physician: Georgette Shell   PCP: Flossie Buffy, NP   Recommendations at discharge: Follow-up with GI in 3 to 4 weeks.  Discharge Diagnoses: Principal Problem:   Choledocholithiasis Active Problems:   Abdominal pain   Elevated LFTs and hyperbilirubinemia   GERD (gastroesophageal reflux disease)   Epigastric abdominal pain with nausea and vomiting   Mixed hyperlipidemia   Epigastric pain   Ovarian cyst   Hyperbilirubinemia  Resolved Problems:   Transaminitis   Hospital Course: 66 year old female with history of cholecystectomy in 2017, hyperlipidemia and anxiety admitted with recurrent abdominal pain.  Followed by Dr. Bryan Lemma as an outpatient was planning for MRCP on 10/17/2021 but patient could not tolerate pain so she came to the ED.  CT scan on 09/22/2021 was noted to have intra and extrahepatic ductal dilatation with possible proximal CBD stricture. She was hospitalized in 2021 with elevated LFTs hyperbilirubinemia choledocholithiasis and Klebsiella bacteremia she underwent ERCP with biliary sphincterotomy and sphincteroplasty followed by stone extraction and was treated with Zosyn at that time.  Assessment and Plan:#1 recurrent choledocholithiasis with dilated CBD history of cholecystectomy in 2017. MRCP 10/14/2021 progressive biliary duct dilatation since June 2021 secondary to abdominal and 15 mm mid to distal common duct stone.  Mild biliary ductal enhancement in the setting of obstruction is nonspecific. Patient placed on Zosyn and for ERCP today. On Actigall at home   #2 hypokalemia patient reports she is allergic to IV or p.o. potassium and refuses to take any replacement prior to ERCP.   #3 hyperlipidemia on statin at home hold due to elevated LFTs   #4 history of GERD on Protonix   #5 enlarged  ovarian cyst follow-up with GYN   #6 osteoporosis on Fosamax which is on hold    Consultants: gi Procedures performed: ercp  Disposition: Home Diet recommendation:  Discharge Diet Orders (From admission, onward)     Start     Ordered   10/16/21 0000  Diet - low sodium heart healthy        10/16/21 1011           Cardiac diet  DISCHARGE MEDICATION: Allergies as of 10/16/2021       Reactions   Oxycodone Nausea And Vomiting, Other (See Comments)   Also made patient lightheaded and lethargic   Potassium Itching, Rash   Patient refuses PO and IV K         Medication List     STOP taking these medications    alendronate 70 MG tablet Commonly known as: FOSAMAX       TAKE these medications    acetaminophen 500 MG tablet Commonly known as: TYLENOL Take 500 mg by mouth every 6 (six) hours as needed for mild pain or moderate pain.   ALPRAZolam 0.25 MG tablet Commonly known as: XANAX Take 1 tablet (0.25 mg total) by mouth daily as needed for anxiety.   CALCIUM 1200 PO Take 1 tablet by mouth daily.   cetirizine 10 MG tablet Commonly known as: ZYRTEC Take 10 mg by mouth daily as needed for allergies.   famotidine 20 MG tablet Commonly known as: PEPCID TAKE 1 TABLET BY MOUTH EVERYDAY AT BEDTIME What changed:  how much to take how to take this when to take this   multivitamin with minerals Tabs tablet Take 1 tablet by mouth daily  with lunch.   omeprazole 40 MG capsule Commonly known as: PRILOSEC TAKE 1 CAPSULE BY MOUTH EVERY DAY What changed:  how much to take additional instructions   ondansetron 4 MG tablet Commonly known as: ZOFRAN Take 1 tablet (4 mg total) by mouth every 6 (six) hours as needed for nausea.   pravastatin 20 MG tablet Commonly known as: PRAVACHOL Take 1 tablet (20 mg total) by mouth at bedtime.   PROBIOTIC PO Take 1 tablet by mouth daily.   ursodiol 250 MG tablet Commonly known as: ACTIGALL TAKE 1 TABLET IN THE MORNING  AND AT BEDTIME. (MAY TAKE 1 TWICE A DAY OR 2 TAB(S) ONCE DAILY. What changed: See the new instructions.        Follow-up Information     Nche, Charlene Brooke, NP Follow up.   Specialty: Internal Medicine Contact information: [place] [id number] [phone]         Jackquline Denmark, MD Follow up.   Specialties: Gastroenterology, Internal Medicine Contact information: [address] [place] [address] [phone]                 Discharge Exam: Danley Danker Weights   10/14/21 0418  Weight: 61 kg   Patient resting in bed in no acute distress she is awake alert anxious to go home denies any nausea or abdominal pain. Chest clear to auscultation Cardiac regular rate and rhythm Abdomen soft nontender good bowel sounds Extremities no edema  Condition at discharge: good  The results of significant diagnostics from this hospitalization (including imaging, microbiology, ancillary and laboratory) are listed below for reference.   Imaging Studies: DG Chest 2 View  Result Date: 10/14/2021 CLINICAL DATA:  66 year old female with history of chest pain. EXAM: CHEST - 2 VIEW COMPARISON:  Chest x-ray 08/20/2021. FINDINGS: Lung volumes are increased with mild emphysematous changes. No consolidative airspace disease. No pleural effusions. No pneumothorax. No pulmonary nodule or mass noted. Pulmonary vasculature and the cardiomediastinal silhouette are within normal limits. Atherosclerosis in the thoracic aorta. IMPRESSION: 1.  No radiographic evidence of acute cardiopulmonary disease. 2. Aortic atherosclerosis. 3. Emphysema. Electronically Signed   By: Vinnie Langton M.D.   On: 10/14/2021 05:44   CT CHEST W CONTRAST  Result Date: 09/23/2021 CLINICAL DATA:  66 year old female with history of atypical chest pain. Epigastric pain with nausea and vomiting. EXAM: CT CHEST AND ABDOMEN WITH CONTRAST TECHNIQUE: Multidetector CT imaging of the  chest and abdomen was performed following the standard protocol during bolus administration of intravenous contrast. RADIATION DOSE REDUCTION: This exam was performed according to the departmental dose-optimization program which includes automated exposure control, adjustment of the mA and/or kV according to patient size and/or use of iterative reconstruction technique. CONTRAST:  125mL OMNIPAQUE IOHEXOL 300 MG/ML  SOLN COMPARISON:  CT the abdomen and pelvis 02/18/2020. Chest CTA 02/23/2020. FINDINGS: CT CHEST FINDINGS Cardiovascular: Heart size is normal. There is no significant pericardial fluid, thickening or pericardial calcification. There is aortic atherosclerosis, as well as atherosclerosis of the great vessels of the mediastinum and the coronary arteries, including calcified atherosclerotic plaque in the left anterior descending and right coronary arteries. Mediastinum/Nodes: No pathologically enlarged mediastinal or hilar lymph nodes. Esophagus is unremarkable in appearance. No axillary lymphadenopathy. Lungs/Pleura: No acute consolidative airspace disease. No pleural effusions. No suspicious appearing pulmonary nodules or masses are noted. Musculoskeletal: There are no aggressive appearing lytic or blastic lesions noted in the visualized portions of the skeleton. CT ABDOMEN FINDINGS Hepatobiliary:  No suspicious cystic or solid hepatic lesions. Status post hysterectomy. As noted on the prior examination there is chronic intra and extrahepatic biliary ductal dilatation. Moderate intrahepatic biliary ductal dilatation appears similar to the prior study, along with a small amount of pneumobilia, indicative of prior sphincterotomy. Common bile duct measures up to 14 mm in the porta hepatis. There is potential narrowing of the common bile duct best visualized on coronal image 54 of series 5, which could be indicative of a chronic bile duct stricture. Beyond this point, the distal common bile duct is normal in  caliber. Pancreas: No pancreatic mass. No pancreatic ductal dilatation. No pancreatic or peripancreatic fluid collections or inflammatory changes. Spleen: Unremarkable. Adrenals/Urinary Tract: Bilateral kidneys and adrenal glands are normal in appearance. No hydroureteronephrosis in the visualized portions of the abdomen. Stomach/Bowel: The appearance of the stomach is normal. There is no pathologic dilatation of visualized portions of small bowel or colon. Vascular/Lymphatic: No aneurysm identified in the visualized abdominal vasculature. No lymphadenopathy noted in the abdomen. Other: No significant volume of ascites noted in the visualized portions of the peritoneal cavity. Musculoskeletal: No aggressive appearing osseous lesions are noted in the visualized portions of the skeleton. IMPRESSION: 1. No acute findings in the thorax to account for the patient's symptoms. 2. 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 On coronal images, there is a potential stricture in the proximal common bile duct, beyond which the common bile duct is otherwise normal in caliber distally. 3. Aortic atherosclerosis, in addition to two vessel coronary artery disease. Please note that although the presence of coronary artery calcium documents the presence of coronary artery disease, the severity of this disease and any potential stenosis cannot be assessed on this non-gated CT examination. Assessment for potential risk factor modification, dietary therapy or pharmacologic therapy may be warranted, if clinically indicated. Electronically Signed   By: Vinnie Langton M.D.   On: 09/23/2021 06:54   CT ABDOMEN W CONTRAST  Result Date: 09/23/2021 CLINICAL DATA:  66 year old female with history of atypical chest pain. Epigastric pain with nausea and vomiting. EXAM: CT CHEST AND ABDOMEN WITH CONTRAST TECHNIQUE: Multidetector CT imaging of the chest and abdomen was performed following the standard  protocol during bolus administration of intravenous contrast. RADIATION DOSE REDUCTION: This exam was performed according to the departmental dose-optimization program which includes automated exposure control, adjustment of the mA and/or kV according to patient size and/or use of iterative reconstruction technique. CONTRAST:  17mL OMNIPAQUE IOHEXOL 300 MG/ML  SOLN COMPARISON:  CT the abdomen and pelvis 02/18/2020. Chest CTA 02/23/2020. FINDINGS: CT CHEST FINDINGS Cardiovascular: Heart size is normal. There is no significant pericardial fluid, thickening or pericardial calcification. There is aortic atherosclerosis, as well as atherosclerosis of the great vessels of the mediastinum and the coronary arteries, including calcified atherosclerotic plaque in the left anterior descending and right coronary arteries. Mediastinum/Nodes: No pathologically enlarged mediastinal or hilar lymph nodes. Esophagus is unremarkable in appearance. No axillary lymphadenopathy. Lungs/Pleura: No acute consolidative airspace disease. No pleural effusions. No suspicious appearing pulmonary nodules or masses are noted. Musculoskeletal: There are no aggressive appearing lytic or blastic lesions noted in the visualized portions of the skeleton. CT ABDOMEN FINDINGS Hepatobiliary: No suspicious cystic or solid hepatic lesions. Status post hysterectomy. As noted on the prior examination there is chronic intra and extrahepatic biliary ductal dilatation. Moderate intrahepatic biliary ductal dilatation appears similar to the prior study, along with a small amount of pneumobilia, indicative of prior sphincterotomy. Common  bile duct measures up to 14 mm in the porta hepatis. There is potential narrowing of the common bile duct best visualized on coronal image 54 of series 5, which could be indicative of a chronic bile duct stricture. Beyond this point, the distal common bile duct is normal in caliber. Pancreas: No pancreatic mass. No pancreatic  ductal dilatation. No pancreatic or peripancreatic fluid collections or inflammatory changes. Spleen: Unremarkable. Adrenals/Urinary Tract: Bilateral kidneys and adrenal glands are normal in appearance. No hydroureteronephrosis in the visualized portions of the abdomen. Stomach/Bowel: The appearance of the stomach is normal. There is no pathologic dilatation of visualized portions of small bowel or colon. Vascular/Lymphatic: No aneurysm identified in the visualized abdominal vasculature. No lymphadenopathy noted in the abdomen. Other: No significant volume of ascites noted in the visualized portions of the peritoneal cavity. Musculoskeletal: No aggressive appearing osseous lesions are noted in the visualized portions of the skeleton. IMPRESSION: 1. No acute findings in the thorax to account for the patient's symptoms. 2. 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 On coronal images, there is a potential stricture in the proximal common bile duct, beyond which the common bile duct is otherwise normal in caliber distally. 3. Aortic atherosclerosis, in addition to two vessel coronary artery disease. Please note that although the presence of coronary artery calcium documents the presence of coronary artery disease, the severity of this disease and any potential stenosis cannot be assessed on this non-gated CT examination. Assessment for potential risk factor modification, dietary therapy or pharmacologic therapy may be warranted, if clinically indicated. Electronically Signed   By: Vinnie Langton M.D.   On: 09/23/2021 06:54   CT ABDOMEN PELVIS W CONTRAST  Result Date: 10/14/2021 CLINICAL DATA:  Epigastric pain EXAM: CT ABDOMEN AND PELVIS WITH CONTRAST TECHNIQUE: Multidetector CT imaging of the abdomen and pelvis was performed using the standard protocol following bolus administration of intravenous contrast. RADIATION DOSE REDUCTION: This exam was performed according to  the departmental dose-optimization program which includes automated exposure control, adjustment of the mA and/or kV according to patient size and/or use of iterative reconstruction technique. CONTRAST:  187mL OMNIPAQUE IOHEXOL 300 MG/ML  SOLN COMPARISON:  09/22/2021 abdominal CT and abdomen and pelvis CT 02/18/2020 FINDINGS: Lower chest:  No contributory findings. Hepatobiliary: No focal liver abnormality.Marked intra and extrahepatic biliary dilatation with scattered pneumobilia that is chronic. There is transition to a normal diameter or even collapse caliber at the level of the pancreatic head, presumed stricture. Cholecystectomy. No detected biliary inflammation or stone. Pancreas: Unremarkable. Spleen: Unremarkable. Adrenals/Urinary Tract: Negative adrenals. No hydronephrosis or stone. Unremarkable bladder. Stomach/Bowel: No obstruction. No appendicitis. Formed stool throughout much of the colon with fecalized appearance of terminal ileal contents. Left colonic diverticulosis. Vascular/Lymphatic: No acute vascular abnormality. Enlarged lymph nodes in the deep liver drainage considered reactive in isolation. Reproductive:4.6 cm right ovarian cyst, previously 3.6 cm. Because this lesion is not adequately characterized, prompt [place] is recommended for further evaluation. Note: This recommendation does not apply to premenarchal patients and to those with increased risk (genetic, family history, elevated tumor markers or other high-risk factors) of ovarian cancer. Reference: JACR 2020 Feb; 17(2):248-254 Multiple clips at the bilateral adnexa. Other: No ascites or pneumoperitoneum. Musculoskeletal: No acute abnormalities. Lumbar spine degeneration and mild scoliosis. IMPRESSION: 1. No acute finding. 2. 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 3. 4.5 cm right ovarian cyst which has enlarged by 1 cm since 2021, recommend  gynecology follow-up for pelvic ultrasound.  Electronically Signed   By: Jorje Guild M.D.   On: 10/14/2021 06:04   DG ERCP  Result Date: 10/15/2021 CLINICAL DATA:  66 year old female undergoing ERCP. EXAM: ERCP TECHNIQUE: Multiple spot images obtained with the fluoroscopic device and submitted for interpretation post-procedure. FLUOROSCOPY TIME:  Fluoroscopy Time:  3 minutes, 27 seconds Radiation Exposure Index (if provided by the fluoroscopic device): 34.07 mGy Number of Acquired Spot Images: 15 COMPARISON:  MRI abdomen from 10/14/2021 FINDINGS: Duodenal scope position near the second portion the duodenum. Retrograde cannulation of the cystic duct was performed. Cholangiogram demonstrates large filling defect in the mid to distal common bile duct and moderate extra and central intrahepatic biliary ductal dilation. Sphincterotomy was performed. Balloon sweep was performed. A plastic common bile duct stent was placed with relative decompression of the biliary tree. IMPRESSION: ERCP with choledocholithiasis, stone removal, and plastic common bile duct stent placement. These images were submitted for radiologic interpretation only. Please see the procedural report for full procedural details, the amount of contrast, and the fluoroscopy time utilized. Electronically Signed   By: Ruthann Cancer M.D.   On: 10/15/2021 14:16   MR ABDOMEN MRCP W WO CONTAST  Result Date: 10/14/2021 CLINICAL DATA:  Abdominal pain and fever. Elevated liver function tests. Question bile duct stricture. Cholecystectomy 5 years ago. EXAM: MRI ABDOMEN WITHOUT AND WITH CONTRAST (INCLUDING MRCP) TECHNIQUE: Multiplanar multisequence MR imaging of the abdomen was performed both before and after the administration of intravenous contrast. Heavily T2-weighted images of the biliary and pancreatic ducts were obtained, and three-dimensional MRCP images were rendered by post processing. CONTRAST:  58mL GADAVIST GADOBUTROL 1 MMOL/ML IV SOLN COMPARISON:  Today's CT and an MRI 02/18/2020.  FINDINGS: Lower chest: Normal heart size without pericardial or pleural effusion. Hepatobiliary: Heterogeneous arterial phase enhancement is likely related to bile duct obstruction. No suspicious liver lesion. Lesions status post cholecystectomy. Moderate intrahepatic biliary duct dilatation. The common duct measures 15 mm in the porta hepatis on 18/4 versus 12 mm on the 02/18/2020 MRI. Followed to the level of a mid to distal common duct stone, including at 1.3 cm on 16/4, 1.5 cm on 18/7, and 1.5 cm on 30/13. This is at the site of smaller stones on the 02/18/2020 MRI. There is suggestion of hyperenhancement involving the biliary ducts. Example coronal image 42/28. Pancreas:  Normal, without mass or ductal dilatation. Spleen:  Normal in size, without focal abnormality. Adrenals/Urinary Tract: Normal adrenal glands. Normal kidneys, without hydronephrosis. Stomach/Bowel: Proximal gastric underdistention. Tiny hiatal hernia. Normal abdominal bowel loops. Vascular/Lymphatic: Aortic atherosclerosis. Patent portal and splenic veins. No abdominal adenopathy. Other:  No abdominal ascites. Musculoskeletal: No acute osseous abnormality. IMPRESSION: 1. Progressive biliary duct dilatation since the MRI of 02/18/2020 secondary to a dominant 15 mm mid to distal common duct stone. Mild biliary ductal enhancement in the setting of obstruction is nonspecific. Recommend clinical correlation for superimposed cholangitis. 2.  Aortic Atherosclerosis (ICD10-I70.0). Electronically Signed   By: Abigail Miyamoto M.D.   On: 10/14/2021 13:08    Microbiology: Results for orders placed or performed during the hospital encounter of 10/14/21  Resp Panel by RT-PCR (Flu A&B, Covid) Nasopharyngeal Swab     Status: None   Collection Time: 10/14/21 10:03 AM   Specimen: Nasopharyngeal Swab; Nasopharyngeal(NP) swabs in vial transport medium  Result Value Ref Range Status   SARS Coronavirus 2 by RT PCR NEGATIVE NEGATIVE Final    Comment:  (NOTE) SARS-CoV-2 target nucleic acids are NOT DETECTED.  The SARS-CoV-2 RNA is generally detectable in upper respiratory specimens during the acute phase of infection. The lowest concentration of SARS-CoV-2 viral copies this assay can detect is 138 copies/mL. A negative result does not preclude SARS-Cov-2 infection and should not be used as the sole basis for treatment or other patient management decisions. A negative result may occur with  improper specimen collection/handling, submission of specimen other than nasopharyngeal swab, presence of viral mutation(s) within the areas targeted by this assay, and inadequate number of viral copies(<138 copies/mL). A negative result must be combined with clinical observations, patient history, and epidemiological information. The expected result is Negative.  Fact Sheet for Patients:  EntrepreneurPulse.com.au  Fact Sheet for Healthcare Providers:  IncredibleEmployment.be  This test is no t yet approved or cleared by the [place] FDA and  has been authorized for detection and/or diagnosis of SARS-CoV-2 by FDA under an Emergency Use Authorization (EUA). This EUA will remain  in effect (meaning this test can be used) for the duration of the COVID-19 declaration under Section 564(b)(1) of the Act, 21 U.S.C.section 360bbb-3(b)(1), unless the authorization is terminated  or revoked sooner.       Influenza A by PCR NEGATIVE NEGATIVE Final   Influenza B by PCR NEGATIVE NEGATIVE Final    Comment: (NOTE) The Xpert Xpress SARS-CoV-2/FLU/RSV plus assay is intended as an aid in the diagnosis of influenza from Nasopharyngeal swab specimens and should not be used as a sole basis for treatment. Nasal washings and aspirates are unacceptable for Xpert Xpress SARS-CoV-2/FLU/RSV testing.  Fact Sheet for Patients: EntrepreneurPulse.com.au  Fact Sheet for Healthcare  Providers: IncredibleEmployment.be  This test is not yet approved or cleared by the [place] FDA and has been authorized for detection and/or diagnosis of SARS-CoV-2 by FDA under an Emergency Use Authorization (EUA). This EUA will remain in effect (meaning this test can be used) for the duration of the COVID-19 declaration under Section 564(b)(1) of the Act, 21 U.S.C. section 360bbb-3(b)(1), unless the authorization is terminated or revoked.  Performed at [facility], [place] [address] [place], [place] [address]     Labs: CBC: Recent Labs  Lab 10/14/21 0420 10/15/21 0146 10/16/21 0532  WBC 7.0 6.3 6.9  HGB 13.4 10.5* 10.7*  HCT 40.7 31.6* 32.0*  MCV 87.0 86.8 86.7  PLT 168 132* 706*   Basic Metabolic Panel: Recent Labs  Lab 10/14/21 0420 10/15/21 0146 10/15/21 0718 10/16/21 0532  NA 139 139  --  140  K 3.7 3.3*  --  3.6  CL 104 108  --  108  CO2 24 24  --  24  GLUCOSE 131* 93  --  102*  BUN 9 9  --  13  CREATININE 0.84 0.71  --  0.78  CALCIUM 8.7* 8.0*  --  8.2*  MG  --   --  2.0  --    Liver Function Tests: Recent Labs  Lab 10/14/21 0626 10/15/21 0146 10/16/21 0532  AST 195* 142* 41  ALT 239* 225* 145*  ALKPHOS 170* 152* 133*  BILITOT 2.8* 3.9* 0.9  PROT 6.6 5.1* 5.1*  ALBUMIN 3.2* 2.3* 2.2*   CBG: No results for input(s): GLUCAP in the last 168 hours.  Discharge time spent: greater than 30 minutes.  Signed: Georgette Shell, MD [facility] 10/16/2021

## 2021-10-16 NOTE — Progress Notes (Signed)
Pt IV removed, cathter intact. Pt given d/c instructions and has no additional concerns at this time. Pt has all belongings and will be ready for d/c after she eats her lunch.

## 2021-10-17 ENCOUNTER — Other Ambulatory Visit: Payer: Medicare HMO

## 2021-10-17 ENCOUNTER — Encounter (HOSPITAL_COMMUNITY): Payer: Self-pay | Admitting: Gastroenterology

## 2021-10-17 ENCOUNTER — Other Ambulatory Visit: Payer: Self-pay | Admitting: Nurse Practitioner

## 2021-10-17 ENCOUNTER — Other Ambulatory Visit: Payer: Self-pay | Admitting: Gastroenterology

## 2021-10-17 LAB — CANCER ANTIGEN 19-9: CA 19-9: 227 U/mL — ABNORMAL HIGH (ref 0–35)

## 2021-10-17 LAB — CEA: CEA: 2 ng/mL (ref 0.0–4.7)

## 2021-10-18 ENCOUNTER — Other Ambulatory Visit: Payer: Self-pay

## 2021-10-19 ENCOUNTER — Other Ambulatory Visit: Payer: Self-pay

## 2021-10-19 DIAGNOSIS — K831 Obstruction of bile duct: Secondary | ICD-10-CM

## 2021-10-21 ENCOUNTER — Ambulatory Visit (INDEPENDENT_AMBULATORY_CARE_PROVIDER_SITE_OTHER): Payer: Medicare HMO | Admitting: Nurse Practitioner

## 2021-10-21 ENCOUNTER — Other Ambulatory Visit: Payer: Self-pay

## 2021-10-21 ENCOUNTER — Encounter: Payer: Self-pay | Admitting: Nurse Practitioner

## 2021-10-21 VITALS — BP 110/78 | HR 84 | Temp 97.0°F | Ht 62.0 in | Wt 121.6 lb

## 2021-10-21 DIAGNOSIS — M810 Age-related osteoporosis without current pathological fracture: Secondary | ICD-10-CM | POA: Diagnosis not present

## 2021-10-21 DIAGNOSIS — Z8041 Family history of malignant neoplasm of ovary: Secondary | ICD-10-CM

## 2021-10-21 DIAGNOSIS — N83201 Unspecified ovarian cyst, right side: Secondary | ICD-10-CM | POA: Diagnosis not present

## 2021-10-21 DIAGNOSIS — E782 Mixed hyperlipidemia: Secondary | ICD-10-CM | POA: Diagnosis not present

## 2021-10-21 DIAGNOSIS — K8050 Calculus of bile duct without cholangitis or cholecystitis without obstruction: Secondary | ICD-10-CM | POA: Diagnosis not present

## 2021-10-21 LAB — COMPREHENSIVE METABOLIC PANEL
ALT: 50 U/L — ABNORMAL HIGH (ref 0–35)
AST: 15 U/L (ref 0–37)
Albumin: 3.8 g/dL (ref 3.5–5.2)
Alkaline Phosphatase: 124 U/L — ABNORMAL HIGH (ref 39–117)
BUN: 10 mg/dL (ref 6–23)
CO2: 29 mEq/L (ref 19–32)
Calcium: 9.1 mg/dL (ref 8.4–10.5)
Chloride: 103 mEq/L (ref 96–112)
Creatinine, Ser: 0.75 mg/dL (ref 0.40–1.20)
GFR: 82.31 mL/min (ref >60.00)
Glucose, Bld: 89 mg/dL (ref 70–99)
Potassium: 4.2 mEq/L (ref 3.5–5.1)
Sodium: 141 mEq/L (ref 135–145)
Total Bilirubin: 0.9 mg/dL (ref 0.2–1.2)
Total Protein: 6.4 g/dL (ref 6.0–8.3)

## 2021-10-21 NOTE — Patient Instructions (Signed)
You will be contacted to schedule appt for pelvic US and appt with GYN. ?Maintain appt with GI. ?

## 2021-10-21 NOTE — Assessment & Plan Note (Signed)
Fosamax on hold due to elevated LFTs ?

## 2021-10-21 NOTE — Progress Notes (Signed)
? ?Subjective:  ?Patient ID: Jasmine Bradford, female    DOB: 30-Jan-1956  Age: 66 y.o. MRN: [id number] ? ?CC: Hospitalization Follow-up [facility] f/u, pt had MRI and would like to discuss) ? ?HPI ? Jasmine Bradford reports improve ABD pain since hospital discharge. Current use of Ursodiol.She has a GI f/up appt on 10/28/21. Elevated CA 19-9 and normal CEA noted, but she has an appt with GI to repeat this. ? ?Hypokalemia during hospitalization. Not replaced due to allergic reaction to oral and IV potassium. Repeat CMP today. ? ?Family History  ?Problem Relation Age of Onset  ? Memory loss Jasmine Bradford   ? Ovarian cancer Jasmine Bradford   ? Cancer Jasmine Bradford   ? Colon polyps Neg Hx   ? Colon cancer Neg Hx   ?  ?Ovarian cyst ?Ct pelvis 10/14/21: 4.6 cm right ovarian cyst, previously 3.6 cm. Becausethis lesion is not adequately characterized, prompt [place] is recommended for further evaluation. Multiple clips at the bilateral adnexa. ?FHx of ovarian cancer (Jasmine Bradford)-unknown age. ?She denies any pelvic pain ?S/p hysterectomy ? ?Entered order for pelvic US and referral to GYN ? ?Mixed hyperlipidemia ?Pravastatin on hold due to elevated LFTs ? ?Osteoporosis ?Fosamax on hold due to elevated LFTs ? ?Reviewed past Medical, Social and Family history today. ? ?Outpatient Medications Prior to Visit  ?Medication Sig Dispense Refill  ? acetaminophen (TYLENOL) 500 MG tablet Take 500 mg by mouth every 6 (six) hours as needed for mild pain or moderate pain.    ? Calcium Carbonate-Vit D-Min (CALCIUM 1200 PO) Take 1 tablet by mouth daily.    ? cetirizine (ZYRTEC) 10 MG tablet Take 10 mg by mouth daily as needed for allergies.    ? famotidine (PEPCID) 20 MG tablet TAKE 1 TABLET BY MOUTH EVERYDAY AT BEDTIME 90 tablet 1  ? Multiple Vitamin (MULTIVITAMIN WITH MINERALS) TABS tablet Take 1 tablet by mouth daily with lunch.    ? omeprazole (PRILOSEC) 40 MG capsule TAKE 1 CAPSULE BY MOUTH EVERY DAY (Patient taking differently: Take 40 mg by mouth daily. TAKE 1 CAPSULE  BY MOUTH EVERY DAY) 90 capsule 2  ? ondansetron (ZOFRAN) 4 MG tablet Take 1 tablet (4 mg total) by mouth every 6 (six) hours as needed for nausea. 20 tablet 0  ? Probiotic Product (PROBIOTIC PO) Take 1 tablet by mouth daily.    ? ursodiol (ACTIGALL) 250 MG tablet TAKE 1 TABLET IN THE MORNING AND AT BEDTIME. (MAY TAKE 1 TWICE A DAY OR 2 TAB(S) ONCE DAILY. (Patient taking differently: Take 250 mg by mouth 2 (two) times daily.) 180 tablet 3  ? ALPRAZolam (XANAX) 0.25 MG tablet Take 1 tablet (0.25 mg total) by mouth daily as needed for anxiety. (Patient not taking: Reported on 10/21/2021) 2 tablet 0  ? pravastatin (PRAVACHOL) 20 MG tablet Take 1 tablet (20 mg total) by mouth at bedtime. 30 tablet 5  ? ?No facility-administered medications prior to visit.  ? ? ?ROS ?See HPI ? ?Objective:  ?BP 110/78 (BP Location: Left Arm, Patient Position: Sitting, Cuff Size: Normal)   Pulse 84   Temp (!) 97 ?F (36.1 ?C) (Temporal)   Ht 5\' 2"  (1.575 m)   Wt 121 lb 9.6 oz (55.2 kg)   SpO2 98%   BMI 22.24 kg/m?  ? ?Physical Exam ?Constitutional:   ?   General: She is not in acute distress. ?Neurological:  ?   Mental Status: She is alert and oriented to person, place, and time.  ? ?Assessment & Plan:  ?This  visit occurred during the SARS-CoV-2 public health emergency.  Safety protocols were in place, including screening questions prior to the visit, additional usage of staff PPE, and extensive cleaning of exam room while observing appropriate contact time as indicated for disinfecting solutions.  ? ?Finesse was seen today for hospitalization follow-up. ? ?Diagnoses and all orders for this visit: ? ?Cyst of right ovary ?-     US PELVIC COMPLETE WITH TRANSVAGINAL; Future ?-     Ambulatory referral to Gynecology ? ?Choledocholithiasis ?-     Comprehensive metabolic panel; Future ?-     Comprehensive metabolic panel ? ?Family history of ovarian cancer ? ?Mixed hyperlipidemia ? ?Age-related osteoporosis without current pathological  fracture ? ? ?Problem List Items Addressed This Visit   ? ?  ? Digestive  ? Choledocholithiasis  ? Relevant Orders  ? Comprehensive metabolic panel  ?  ? Endocrine  ? Ovarian cyst - Primary  ?  Ct pelvis 10/14/21: 4.6 cm right ovarian cyst, previously 3.6 cm. Becausethis lesion is not adequately characterized, prompt [place] is recommended for further evaluation. Multiple clips at the bilateral adnexa. ?FHx of ovarian cancer (Jasmine Bradford)-unknown age. ?She denies any pelvic pain ?S/p hysterectomy ? ?Entered order for pelvic US and referral to GYN ?  ?  ? Relevant Orders  ? US PELVIC COMPLETE WITH TRANSVAGINAL  ? Ambulatory referral to Gynecology  ?  ? Musculoskeletal and Integument  ? Osteoporosis  ?  Fosamax on hold due to elevated LFTs ?  ?  ?  ? Other  ? Family history of ovarian cancer  ? Mixed hyperlipidemia  ?  Pravastatin on hold due to elevated LFTs ?  ?  ?  ?Follow-up: Return if symptoms worsen or fail to improve. ? ?Wilfred Lacy, NP ?

## 2021-10-21 NOTE — Assessment & Plan Note (Signed)
Ct pelvis 10/14/21: 4.6 cm right ovarian cyst, previously 3.6 cm. Becausethis lesion is not adequately characterized, prompt [place] is recommended for further evaluation. Multiple clips at the bilateral adnexa. ?FHx of ovarian cancer (mother)-unknown age. ?She denies any pelvic pain ?S/p hysterectomy ? ?Entered order for pelvic US and referral to GYN ?

## 2021-10-21 NOTE — Assessment & Plan Note (Signed)
Pravastatin on hold due to elevated LFTs ?

## 2021-10-25 ENCOUNTER — Ambulatory Visit: Payer: Medicare HMO | Admitting: Gastroenterology

## 2021-10-25 ENCOUNTER — Other Ambulatory Visit: Payer: Medicare HMO

## 2021-10-25 DIAGNOSIS — K831 Obstruction of bile duct: Secondary | ICD-10-CM

## 2021-10-26 ENCOUNTER — Ambulatory Visit
Admission: RE | Admit: 2021-10-26 | Discharge: 2021-10-26 | Disposition: A | Discharge status: Home / Self Care | Payer: Medicare HMO | Source: Ambulatory Visit | Attending: Nurse Practitioner | Admitting: Nurse Practitioner

## 2021-10-26 DIAGNOSIS — N888 Other specified noninflammatory disorders of cervix uteri: Secondary | ICD-10-CM | POA: Diagnosis not present

## 2021-10-26 DIAGNOSIS — N83201 Unspecified ovarian cyst, right side: Secondary | ICD-10-CM

## 2021-10-26 DIAGNOSIS — Z780 Asymptomatic menopausal state: Secondary | ICD-10-CM | POA: Diagnosis not present

## 2021-10-26 DIAGNOSIS — Z8041 Family history of malignant neoplasm of ovary: Secondary | ICD-10-CM | POA: Diagnosis not present

## 2021-10-26 LAB — CANCER ANTIGEN 19-9: CA 19-9: 30 U/mL

## 2021-10-27 ENCOUNTER — Telehealth: Payer: Self-pay

## 2021-10-27 DIAGNOSIS — N83299 Other ovarian cyst, unspecified side: Secondary | ICD-10-CM

## 2021-10-27 NOTE — Telephone Encounter (Signed)
Imaging called and states patient Jasmine Bradford should be reviewed asap.  ?

## 2021-10-27 NOTE — Addendum Note (Signed)
Addended by: Wilfred Lacy L on: 10/27/2021 01:17 PM ? ? Modules accepted: Orders ? ?

## 2021-10-28 ENCOUNTER — Other Ambulatory Visit: Payer: Self-pay

## 2021-10-28 ENCOUNTER — Ambulatory Visit: Payer: Medicare HMO | Admitting: Gastroenterology

## 2021-10-28 ENCOUNTER — Encounter: Payer: Self-pay | Admitting: Gastroenterology

## 2021-10-28 VITALS — BP 102/64 | HR 77 | Ht 62.0 in | Wt 120.2 lb

## 2021-10-28 DIAGNOSIS — R0789 Other chest pain: Secondary | ICD-10-CM | POA: Diagnosis not present

## 2021-10-28 DIAGNOSIS — K219 Gastro-esophageal reflux disease without esophagitis: Secondary | ICD-10-CM | POA: Diagnosis not present

## 2021-10-28 DIAGNOSIS — R0989 Other specified symptoms and signs involving the circulatory and respiratory systems: Secondary | ICD-10-CM

## 2021-10-28 DIAGNOSIS — Z86010 Personal history of colonic polyps: Secondary | ICD-10-CM | POA: Diagnosis not present

## 2021-10-28 DIAGNOSIS — K8050 Calculus of bile duct without cholangitis or cholecystitis without obstruction: Secondary | ICD-10-CM | POA: Diagnosis not present

## 2021-10-28 NOTE — Patient Instructions (Signed)
If you are age 68 or older, your body mass index should be between 23-30. Your Body mass index is 21.99 kg/m?Marland Kitchen If this is out of the aforementioned range listed, please consider follow up with your Primary Care Provider. ? ?If you are age 21 or younger, your body mass index should be between 19-25. Your Body mass index is 21.99 kg/m?Marland Kitchen If this is out of the aformentioned range listed, please consider follow up with your Primary Care Provider.  ? ?________________________________________________________ ? ?The [facility] providers would like to encourage you to use [facility] to communicate with providers for non-urgent requests or questions.  Due to long hold times on the telephone, sending your provider a message by [facility] may be a faster and more efficient way to get a response.  Please allow 48 business hours for a response.  Please remember that this is for non-urgent requests.  ?_______________________________________________________ ? ?Please call in 6 months to schedule follow up appointment. ? ?Please call with any questions or concerns. ? ?It was a pleasure to see you today! ? ?Gerrit Heck, D.O. ? ?

## 2021-10-28 NOTE — Progress Notes (Signed)
? ?Chief Complaint:    Hospital follow-up, choledocholithiasis ? ?GI History: 26 old female follows in the GI clinic for the following: ?  ?1) GERD: Index symptoms of dyspepsia, globus sensation.  No heartburn or regurgitation.  No dysphagia.  Well-controlled with Prilosec 40 mg/day. Possible bile acid reflux overlap. ?  ?2) Choledocholithiasis: History of cholelithiasis s/p ccy with IOC in 05/2016 with subsequent ERCP in 05/2018.  Readmitted 01/2020 with choledocholithiasis, underwent repeat ERCP with stone extraction.  Complicated by readmission 02/23/2020 with HCAP, pleural effusion treated with ABX and thoracentesis.  Started ursodiol for stone dissolution/choledocholithiasis prevention.  Readmitted 09/2021 with recurrent CDL as below.  MRI/MRCP with progressive biliary duct dilatation due to dominant 15 mm mid to distal common duct stone.  Underwent repeat ERCP on 10/15/2021 as below.  CA 19-9 was elevated, then subsequently normalized 1 week later.  Normal CEA. ?  ?3) History of colon polyps: Due for repeat colonoscopy in 2023 for ongoing polyp surveillance ?  ?Endoscopic history: ?-ERCP (02/2016, Dr. Watt Climes, [place]): Choledocholithiasis, removed with sphincterotomy and balloon sweeps ?-ERCP (05/2016, Dr. Watt Climes): Choledocholithiasis, removed with further sphincterotomy and balloon sweeps with removal of stones and sludge ?-EGD (05/2018, Dr. Watt Climes): Normal ?-ERCP (05/2018, Dr. Watt Climes): No stones on cholangiogram.  Balloon sweeps (nothing removed) and dilation of distal CBD/ampulla with 4 cm x 8 mm balloon dilator to facilitate improved drainage ?-Colonoscopy (09/2018, Dr. Watt Climes): External/internal hemorrhoids, sigmoid diverticulosis, 7 polyps resected (no path review), normal TI.  Recommended repeat in 3 years ?-ERCP (01/2020, Dr. Lyndel Safe): Removal of multiple stones and pus.  Normal post occlusion cholangiogram ?- ERCP (09/2021, Dr. Lyndel Safe): Dilated biliary tree with "sigmoid" appearing CBD measuring 15 mm.  Multiple filling  defects c/w choledocholithiasis.  Sphincteroplasty of the CBD with 13.5 mm balloon then biliary tree swept with removal of several small stones and sludge.  Largest stone could not be removed with 15 mm balloon.  Spy scope inserted.  No stricture noted.  Large stone noted in EHL performed with fracturing.  CBD then swept with 15 mm balloon several times with extraction of significant sludge.  Placed 10 French by 7 cm plastic stent into CBD.  ?  ? ? ?HPI:   ? ? ?Patient is a 66 y.o. female presenting to the Gastroenterology Clinic for hospital follow-up.  Was admitted 2/23-25 with epigastric/upper abdominal pain, fever, nausea, and again diagnosed with recurrent choledocholithiasis.  Started on empiric ABX and ERCP performed on 10/15/2021 and notable for the following: ? ?Dilated biliary tree with "sigmoid" appearing CBD measuring 15 mm.  Multiple filling defects c/w choledocholithiasis.  Sphincteroplasty of the CBD with 13.5 mm balloon then biliary tree swept with removal of several small stones and sludge.  Largest stone could not be removed with 15 mm balloon.  Spy scope inserted.  No stricture noted.  Large stone noted in EHL performed with fracturing.  CBD then swept with 15 mm balloon several times with extraction of significant sludge.  Placed 10 French by 7 cm plastic stent into CBD.  Recommended EUS and/or ERCP with repeat spy with Dr. Rush Landmark in 6-8 weeks; scheduled for 12/09/2021 at 7:30 AM. ? ?Repeat labs on 10/21/2021 with downtrending ALP (124), AST/ALT, and normal T. bili, as below.  CA 19-9 has since normalized. ? ?Today, states she otherwise feels nearly back to her baseline state of health.  No acute issues today.  She is very concerned about CDL recurrence. ? ?CMP Latest Ref Rng & Units 10/21/2021 10/16/2021 10/15/2021  ?Glucose 70 -  99 mg/dL 89 102(H) 93  ?BUN 6 - 23 mg/dL '10 13 9  '$ ?Creatinine 0.40 - 1.20 mg/dL 0.75 0.78 0.71  ?Sodium 135 - 145 mEq/L 141 140 139  ?Potassium 3.5 - 5.1 mEq/L 4.2 3.6  3.3(L)  ?Chloride 96 - 112 mEq/L 103 108 108  ?CO2 19 - 32 mEq/L '29 24 24  '$ ?Calcium 8.4 - 10.5 mg/dL 9.1 8.2(L) 8.0(L)  ?Total Protein 6.0 - 8.3 g/dL 6.4 5.1(L) 5.1(L)  ?Total Bilirubin 0.2 - 1.2 mg/dL 0.9 0.9 3.9(H)  ?Alkaline Phos 39 - 117 U/L 124(H) 133(H) 152(H)  ?AST 0 - 37 U/L 15 41 142(H)  ?ALT 0 - 35 U/L 50(H) 145(H) 225(H)  ? ? ? ?Review of systems:     No chest pain, no SOB, no fevers, no urinary sx  ? ?Past Medical History:  ?Diagnosis Date  ? Allergy   ? Cholelithiasis with choledocholithiasis   ? Diverticulitis   ? GERD (gastroesophageal reflux disease)   ? Tobacco abuse   ? Varicose vein of leg   ? no problems now  ? Wears glasses   ? ? ?Patient's surgical history, family medical history, social history, medications and allergies were all reviewed in Epic  ? ? ?Current Outpatient Medications  ?Medication Sig Dispense Refill  ? acetaminophen (TYLENOL) 500 MG tablet Take 500 mg by mouth every 6 (six) hours as needed for mild pain or moderate pain.    ? Calcium Carbonate-Vit D-Min (CALCIUM 1200 PO) Take 1 tablet by mouth daily.    ? cetirizine (ZYRTEC) 10 MG tablet Take 10 mg by mouth daily as needed for allergies.    ? famotidine (PEPCID) 20 MG tablet TAKE 1 TABLET BY MOUTH EVERYDAY AT BEDTIME 90 tablet 1  ? Multiple Vitamin (MULTIVITAMIN WITH MINERALS) TABS tablet Take 1 tablet by mouth daily with lunch.    ? omeprazole (PRILOSEC) 40 MG capsule TAKE 1 CAPSULE BY MOUTH EVERY DAY (Patient taking differently: Take 40 mg by mouth daily. TAKE 1 CAPSULE BY MOUTH EVERY DAY) 90 capsule 2  ? ondansetron (ZOFRAN) 4 MG tablet Take 1 tablet (4 mg total) by mouth every 6 (six) hours as needed for nausea. 20 tablet 0  ? Probiotic Product (PROBIOTIC PO) Take 1 tablet by mouth daily.    ? ursodiol (ACTIGALL) 250 MG tablet TAKE 1 TABLET IN THE MORNING AND AT BEDTIME. (MAY TAKE 1 TWICE A DAY OR 2 TAB(S) ONCE DAILY. (Patient taking differently: Take 250 mg by mouth 2 (two) times daily.) 180 tablet 3  ? ?No current  facility-administered medications for this visit.  ? ? ?Physical Exam:   ? ? ?BP 102/64   Pulse 77   Ht '5\' 2"'$  (1.575 m)   Wt 120 lb 4 oz (54.5 kg)   SpO2 97%   BMI 21.99 kg/m?  ? ?GENERAL:  Pleasant female in NAD ?PSYCH: : Cooperative, normal affect ?CARDIAC:  RRR, no murmur heard, no peripheral edema ?PULM: Normal respiratory effort, lungs CTA bilaterally, no wheezing ?ABDOMEN:  Nondistended, soft, nontender. No obvious masses, no hepatomegaly,  normal bowel sounds ?SKIN:  turgor, no lesions seen ?Musculoskeletal:  Normal muscle tone, normal strength ?NEURO: Alert and oriented x 3, no focal neurologic deficits ? ? ?IMPRESSION and PLAN:   ? ?1) Recurrent Choledocholithiasis ?- Etiology for recurrent CDL (de novo stone formation within the CBD) not entirely clear.  Currently with CBD plastic stent in place.  Not sure ursodiol is helping, but she would like to continue for the time being  until upcoming repeat ERCP. ?- Proceed with repeat ERCP with spyglass with Dr. Rush Landmark on 12/09/2021 scheduled ?- Liver enzymes downtrending ?- CA 19-9 was initially elevated, blood repeat has since normalized ? ?2) GERD ?3) Atypical chest pain ?4) Globus sensation ?History of reflux, but possibly overlapping bile acid reflux and/or esophageal hypersensitivity. ?- Continue omeprazole ?- Continue Pepcid ?- Has had good response to trial of peppermint Altoids ?- Could again consider trial of venlafaxine as was discussed last month ? ?5) History of colon polyps ?- Due for repeat colonoscopy in 2023 for ongoing polyp surveillance.  Plan to address that after evaluation/treatment of more pressing issues above ? ?I spent 32 minutes of time, including in depth chart review, independent review of results as outlined above, communicating results with the patient directly, face-to-face time with the patient, coordinating care, and ordering studies and medications as appropriate, and documentation. ? ?    ?    ? ?Lavena Bullion ,DO,  FACG 10/28/2021, 9:47 AM ? ?

## 2021-11-01 ENCOUNTER — Encounter: Payer: Self-pay | Admitting: Gastroenterology

## 2021-11-05 ENCOUNTER — Other Ambulatory Visit: Payer: Self-pay

## 2021-11-05 ENCOUNTER — Encounter: Payer: Self-pay | Admitting: Obstetrics & Gynecology

## 2021-11-05 ENCOUNTER — Ambulatory Visit: Payer: Medicare HMO | Admitting: Obstetrics & Gynecology

## 2021-11-05 VITALS — BP 114/70 | HR 70 | Resp 16 | Ht 60.5 in | Wt 121.0 lb

## 2021-11-05 DIAGNOSIS — R935 Abnormal findings on diagnostic imaging of other abdominal regions, including retroperitoneum: Secondary | ICD-10-CM | POA: Diagnosis not present

## 2021-11-05 DIAGNOSIS — N839 Noninflammatory disorder of ovary, fallopian tube and broad ligament, unspecified: Secondary | ICD-10-CM | POA: Diagnosis not present

## 2021-11-05 DIAGNOSIS — Z8041 Family history of malignant neoplasm of ovary: Secondary | ICD-10-CM | POA: Diagnosis not present

## 2021-11-05 DIAGNOSIS — N83299 Other ovarian cyst, unspecified side: Secondary | ICD-10-CM

## 2021-11-05 DIAGNOSIS — N83291 Other ovarian cyst, right side: Secondary | ICD-10-CM | POA: Diagnosis not present

## 2021-11-05 DIAGNOSIS — M959 Acquired deformity of musculoskeletal system, unspecified: Secondary | ICD-10-CM | POA: Diagnosis not present

## 2021-11-05 NOTE — Progress Notes (Signed)
? ? ?Jasmine Bradford [birth year] [id number] ? ? ?     66 y.o.  G4P4L4  ? ?RP: Complex Right Ovarian Cyst ? ?HPI: Incidental finding of a Complex Right Ovarian Cyst, first on CT scan, then confirmed by Pelvic US.  No pelvic pain.  Postmenopause x many years, well on no HRT.  No PMB.  No change in weight.   ? ? ?OB History  ?Gravida Para Term Preterm AB Living  ?4         4  ?SAB IAB Ectopic Multiple Live Births  ?        4  ?  ?# Outcome Date GA Lbr Len/2nd Weight Sex Delivery Anes PTL Lv  ?4 Gravida           ?3 Gravida           ?2 Gravida           ?1 Gravida           ? ? ?Past medical history,surgical history, problem list, medications, allergies, family history and social history were all reviewed and documented in the EPIC chart. ? ? ?Directed ROS with pertinent positives and negatives documented in the history of present illness/assessment and plan. ? ?Exam: ? ?Vitals:  ? 11/05/21 1514  ?BP: 114/70  ?Pulse: 70  ?Resp: 16  ?Weight: 121 lb (54.9 kg)  ?Height: 5' 0.5" (1.537 m)  ? ?General appearance:  Normal ? ?Abdomen: Normal ? ?Bilateral Inguinal areas:  No LN felt. ? ?Gynecologic exam: Vulva normal.  Bimanual exam:  ' ? ?CT Scan abdomen/pelvis 10/14/2021:  ?Reproductive:4.6 cm right ovarian cyst, previously 3.6 cm. Because ?this lesion is not adequately characterized, prompt [place] is ?recommended for further evaluation. Note: This recommendation does ?not apply to premenarchal patients and to those with increased risk ?(genetic, family history, elevated tumor markers or other high-risk ?factors) of ovarian cancer. Reference: JACR 2020 Feb; 17(2):248-254 ?Multiple clips at the bilateral adnexa. ?No ascites or pneumoperitoneum. ?  ? ?Pelvic US 10/26/2021:  ? ?RIGHT ovarian cyst, abnormal CT, family history of ?ovarian cancer, postmenopausal ?  ?EXAM: ?TRANSABDOMINAL AND TRANSVAGINAL ULTRASOUND OF PELVIS ?  ?TECHNIQUE: ?Both transabdominal and transvaginal ultrasound examinations of the ?pelvis were performed.  Transabdominal technique was performed for ?global imaging of the pelvis including uterus, ovaries, adnexal ?regions, and pelvic cul-de-sac. It was necessary to proceed with ?endovaginal exam following the transabdominal exam to visualize the ?endometrium and ovaries. ?  ?COMPARISON:  CT abdomen and pelvis 10/14/2021 ?  ?FINDINGS: ?Uterus ?  ?Measurements: 6.9 x 2.0 x 4.0 cm = volume: 27 ML. Anteverted. ?Heterogeneous myometrium. Nabothian cysts at cervix. Scattered ?calcifications at arcuate arteries. No discrete uterine mass. ?  ?Endometrium ?  ?Thickness: 2 mm.  No endometrial fluid or mass ?  ?Right ovary ?  ?Measurements: 4.7 x 3.5 x 3.3 cm = volume: 28 mL. Cyst within RIGHT ?ovary 5.1 x 4.5 x 3.0 cm, containing a mildly irregular wall and ?partial septation, and scattered internal echoes. Blood flow ?identified within an area of wall irregularity question mural ?nodularity on color Doppler imaging. ?  ?Left ovary ?  ?Not visualized, likely obscured by bowel ?  ?Other findings ?  ?No free pelvic fluid.  No additional pelvic masses. ?  ?IMPRESSION: ?Unremarkable uterus and endometrial complex. ?  ?Nonvisualization of LEFT ovary. ?  ?Complex cystic lesion of RIGHT ovary containing an irregular wall, ?partial septation, scattered internal echoes, and blood flow within ?an area of more prominent wall irregularity versus mural  nodularity ?on color Doppler imaging. ?  ?Ovarian neoplasm not excluded; surgical evaluation recommended. ?  ?These results will be called to the ordering clinician or ?representative by the Radiologist Assistant, and communication ?documented in the PACS or [employer]. ?  ?  ?Electronically Signed ?  By: Lavonia Dana M.D. ?  On: 10/26/2021 17:27 ? ?CEA Normal at 2. ?Ca19-9 Initially elevated in 09/2021, then normal at 30 in 10/2021. ? ? ?Assessment/Plan:  66 y.o. G4P0  ? ?1. Right Complex ovarian cyst ? Incidental finding of a Complex Right Ovarian Cyst, first on CT scan, then confirmed by  Pelvic US.  No pelvic pain.  Postmenopause x many years, well on no HRT.  No PMB.  No change in weight.  Fam history positive for Ovarian Ca.  Rt complex Ovarian Cyst 5.1 cm suspicious for Ovarian Ca.  Pelvic CT and US findings thoroughly reviewed with patient.  Significance discussed with patient.  Management reviewed.  Given the risk of malignity, decision to refer patient to [facility] for further evaluation and management, including surgery.  Ca 125 drawn today. ?- CA 125  ? ? ?Princess Bruins MD, 4:10 PM 11/05/2021 ? ? ? ?  ?

## 2021-11-08 ENCOUNTER — Telehealth: Payer: Self-pay

## 2021-11-08 DIAGNOSIS — K668 Other specified disorders of peritoneum: Secondary | ICD-10-CM

## 2021-11-08 DIAGNOSIS — N839 Noninflammatory disorder of ovary, fallopian tube and broad ligament, unspecified: Secondary | ICD-10-CM

## 2021-11-08 LAB — CA 125: CA 125: 9 U/mL (ref <35)

## 2021-11-08 NOTE — Telephone Encounter (Signed)
That is correct. The MRCP was ordered by me prior to the hospital admission, but was subsequently completed in the hospital. She does not need another one. Instead, plan is to proceed with repeat ERCP with spyglass with Dr. Rush Landmark on 12/09/2021 as scheduled. Thanks.  ?

## 2021-11-08 NOTE — Telephone Encounter (Signed)
Referral order placed in Epic. 

## 2021-11-08 NOTE — Telephone Encounter (Signed)
Refer to [facility] [place] office ?Received: 3 days ago ?Princess Bruins, MD  P [facility] Triage ?66 yo with a Complex cystic lesion of RIGHT ovary 5.1 x 4.5 cm containing an irregular wall,  ?partial septation, scattered internal echoes, and blood flow within  ?an area of more prominent wall irregularity versus mural nodularity  ?on color Doppler imaging.  Ca 125 pending.  ?

## 2021-11-09 ENCOUNTER — Telehealth: Payer: Self-pay | Admitting: *Deleted

## 2021-11-09 ENCOUNTER — Encounter: Payer: Self-pay | Admitting: Obstetrics & Gynecology

## 2021-11-09 NOTE — Telephone Encounter (Signed)
Attempted to reach the patient to schedule a new patient appt with Dr Berline Lopes; [facility] for the patient to call the office back. Patient can be scheduled for 3/31.  ?

## 2021-11-11 NOTE — Telephone Encounter (Signed)
Spoke with the patient and scheduled a new patient appt with Dr Berline Lopes on 4/3 at 9 am. Patient given the address and phone number for the clinic; along with the policy for mask and visitors  ?

## 2021-11-12 ENCOUNTER — Ambulatory Visit (HOSPITAL_BASED_OUTPATIENT_CLINIC_OR_DEPARTMENT_OTHER): Payer: Medicare HMO

## 2021-11-15 NOTE — Telephone Encounter (Signed)
Patient Is scheduled to see Dr. Berline Lopes on 11/22/21. ?

## 2021-11-19 ENCOUNTER — Encounter: Payer: Self-pay | Admitting: Gynecologic Oncology

## 2021-11-19 ENCOUNTER — Encounter: Payer: Self-pay | Admitting: Nurse Practitioner

## 2021-11-19 ENCOUNTER — Ambulatory Visit (INDEPENDENT_AMBULATORY_CARE_PROVIDER_SITE_OTHER): Payer: Medicare HMO | Admitting: Nurse Practitioner

## 2021-11-19 VITALS — BP 100/60 | HR 80 | Temp 97.8°F | Ht 61.5 in | Wt 121.4 lb

## 2021-11-19 DIAGNOSIS — E782 Mixed hyperlipidemia: Secondary | ICD-10-CM | POA: Diagnosis not present

## 2021-11-19 DIAGNOSIS — N83201 Unspecified ovarian cyst, right side: Secondary | ICD-10-CM | POA: Diagnosis not present

## 2021-11-19 DIAGNOSIS — Z0001 Encounter for general adult medical examination with abnormal findings: Secondary | ICD-10-CM

## 2021-11-19 NOTE — Patient Instructions (Signed)
Go to lab for blood draw ? ?Schedule appt for repeat mammogram ? ?Preventive Care 53 Years and Older, Female ?Preventive care refers to lifestyle choices and visits with your health care provider that can promote health and wellness. Preventive care visits are also called wellness exams. ?What can I expect for my preventive care visit? ?Counseling ?Your health care provider may ask you questions about your: ?Medical history, including: ?Past medical problems. ?Family medical history. ?Pregnancy and menstrual history. ?History of falls. ?Current health, including: ?Memory and ability to understand (cognition). ?Emotional well-being. ?Home life and relationship well-being. ?Sexual activity and sexual health. ?Lifestyle, including: ?Alcohol, nicotine or tobacco, and drug use. ?Access to firearms. ?Diet, exercise, and sleep habits. ?Work and work Statistician. ?Sunscreen use. ?Safety issues such as seatbelt and bike helmet use. ?Physical exam ?Your health care provider will check your: ?Height and weight. These may be used to calculate your BMI (body mass index). BMI is a measurement that tells if you are at a healthy weight. ?Waist circumference. This measures the distance around your waistline. This measurement also tells if you are at a healthy weight and may help predict your risk of certain diseases, such as type 2 diabetes and high blood pressure. ?Heart rate and blood pressure. ?Body temperature. ?Skin for abnormal spots. ?What immunizations do I need? ?Vaccines are usually given at various ages, according to a schedule. Your health care provider will recommend vaccines for you based on your age, medical history, and lifestyle or other factors, such as travel or where you work. ?What tests do I need? ?Screening ?Your health care provider may recommend screening tests for certain conditions. This may include: ?Lipid and cholesterol levels. ?Hepatitis C test. ?Hepatitis B test. ?HIV (human immunodeficiency virus)  test. ?STI (sexually transmitted infection) testing, if you are at risk. ?Lung cancer screening. ?Colorectal cancer screening. ?Diabetes screening. This is done by checking your blood sugar (glucose) after you have not eaten for a while (fasting). ?Mammogram. Talk with your health care provider about how often you should have regular mammograms. ?BRCA-related cancer screening. This may be done if you have a family history of breast, ovarian, tubal, or peritoneal cancers. ?Bone density scan. This is done to screen for osteoporosis. ?Talk with your health care provider about your test results, treatment options, and if necessary, the need for more tests. ?Follow these instructions at home: ?Eating and drinking ? ?Eat a diet that includes fresh fruits and vegetables, whole grains, lean protein, and low-fat dairy products. Limit your intake of foods with high amounts of sugar, saturated fats, and salt. ?Take vitamin and mineral supplements as recommended by your health care provider. ?Do not drink alcohol if your health care provider tells you not to drink. ?If you drink alcohol: ?Limit how much you have to 0-1 drink a day. ?Know how much alcohol is in your drink. In the [place] one drink equals one 12 oz bottle of beer (355 mL), one 5 oz glass of wine (148 mL), or one 1? oz glass of hard liquor (44 mL). ?Lifestyle ?Brush your teeth every morning and night with fluoride toothpaste. Floss one time each day. ?Exercise for at least 30 minutes 5 or more days each week. ?Do not use any products that contain nicotine or tobacco. These products include cigarettes, chewing tobacco, and vaping devices, such as e-cigarettes. If you need help quitting, ask your health care provider. ?Do not use drugs. ?If you are sexually active, practice safe sex. Use a condom or other form of  protection in order to prevent STIs. ?Take aspirin only as told by your health care provider. Make sure that you understand how much to take and what form to  take. Work with your health care provider to find out whether it is safe and beneficial for you to take aspirin daily. ?Ask your health care provider if you need to take a cholesterol-lowering medicine (statin). ?Find healthy ways to manage stress, such as: ?Meditation, yoga, or listening to music. ?Journaling. ?Talking to a trusted person. ?Spending time with friends and family. ?Minimize exposure to UV radiation to reduce your risk of skin cancer. ?Safety ?Always wear your seat belt while driving or riding in a vehicle. ?Do not drive: ?If you have been drinking alcohol. Do not ride with someone who has been drinking. ?When you are tired or distracted. ?While texting. ?If you have been using any mind-altering substances or drugs. ?Wear a helmet and other protective equipment during sports activities. ?If you have firearms in your house, make sure you follow all gun safety procedures. ?What's next? ?Visit your health care provider once a year for an annual wellness visit. ?Ask your health care provider how often you should have your eyes and teeth checked. ?Stay up to date on all vaccines. ?This information is not intended to replace advice given to you by your health care provider. Make sure you discuss any questions you have with your health care provider. ?Document Revised: 02/03/2021 Document Reviewed: 02/03/2021 ?Elsevier Patient Education ? [place]. ? ?

## 2021-11-19 NOTE — Progress Notes (Signed)
? ?Complete physical exam ? ?Patient: Jasmine Bradford   DOB: [birth year]   66 y.o. Female  MRN: [id number] ?Visit Date: 11/19/2021 ? ?Subjective:  ?  ?Chief Complaint  ?Patient presents with  ? Annual Exam  ?  Physical-No breast or pap exam needed ?Pt is not fasting.   ? ? ?Jasmine Bradford is a 66 y.o. female who presents today for a complete physical exam. She reports consuming a low fat and low sodium diet.  Walking 3x/week 43mns.  She generally feels well. She reports sleeping well. She does not have additional problems to discuss today.  ?Vision:Yes ?Dental:Yes ? ?Most recent fall risk assessment: ? ?  06/22/2021  ?  1:01 PM  ?Fall Risk   ?Falls in the past year? 0  ?Number falls in past yr: 0  ?Injury with Fall? 0  ?Risk for fall due to : No Fall Risks  ?Follow up Falls evaluation completed  ? ?Most recent depression screenings: ? ?  11/19/2021  ?  1:14 PM 10/21/2021  ?  9:36 AM  ?PHQ 2/9 Scores  ?PHQ - 2 Score 0 0  ?PHQ- 9 Score 2 0  ? ?HPI  ?Ovarian cyst ?appt on 11/22/21 with [facility] to discuss oophrectomy and possible hysterectomy, ? ?Wt Readings from Last 3 Encounters:  ?11/19/21 121 lb 6.4 oz (55.1 kg)  ?11/05/21 121 lb (54.9 kg)  ?10/28/21 120 lb 4 oz (54.5 kg)  ?  ?Past Medical History:  ?Diagnosis Date  ? Allergy   ? Cholelithiasis with choledocholithiasis   ? Diverticulitis   ? GERD (gastroesophageal reflux disease)   ? Tobacco abuse   ? Varicose vein of leg   ? no problems now  ? Wears glasses   ? ?Past Surgical History:  ?Procedure Laterality Date  ? BILIARY DILATION  02/19/2020  ? Procedure: BILIARY DILATION;  Surgeon: GJackquline Denmark MD;  [facility];  Service: Endoscopy;;  ? BILIARY DILATION  10/15/2021  ? Procedure: BILIARY DILATION;  Surgeon: GJackquline Denmark MD;  [facility];  Service: Gastroenterology;;  ? BILIARY STENT PLACEMENT  10/15/2021  ? Procedure: BILIARY STENT PLACEMENT;  Surgeon: GJackquline Denmark MD;  [place]  Service: Gastroenterology;;   ? CESAREAN SECTION    ? CHOLECYSTECTOMY N/A 05/23/2016  ? Procedure: LAPAROSCOPIC CHOLECYSTECTOMY WITH INTRAOPERATIVE CHOLANGIOGRAM;  Surgeon: TJackolyn Confer MD;  [place]  Service: General;  Laterality: N/A;  ? ENDOSCOPIC RETROGRADE CHOLANGIOPANCREATOGRAPHY (ERCP) WITH PROPOFOL N/A 06/06/2016  ? Procedure: ENDOSCOPIC RETROGRADE CHOLANGIOPANCREATOGRAPHY (ERCP) WITH PROPOFOL;  Surgeon: MClarene Essex MD;  [facility];  Service: Endoscopy;  Laterality: N/A;  ? ENDOSCOPIC RETROGRADE CHOLANGIOPANCREATOGRAPHY (ERCP) WITH PROPOFOL N/A 02/19/2020  ? Procedure: ENDOSCOPIC RETROGRADE CHOLANGIOPANCREATOGRAPHY (ERCP) WITH PROPOFOL;  Surgeon: GJackquline Denmark MD;  [facility]  Service: Endoscopy;  Laterality: N/A;  ? ENDOSCOPIC RETROGRADE CHOLANGIOPANCREATOGRAPHY (ERCP) WITH PROPOFOL N/A 10/15/2021  ? Procedure: ENDOSCOPIC RETROGRADE CHOLANGIOPANCREATOGRAPHY (ERCP) WITH PROPOFOL;  Surgeon: GJackquline Denmark MD;  [place]  Service: Gastroenterology;  Laterality: N/A;  ? ERCP N/A 03/09/2016  ? Procedure: ENDOSCOPIC RETROGRADE CHOLANGIOPANCREATOGRAPHY (ERCP);  Surgeon: MClarene Essex MD;  [facility];  Service: Endoscopy;  Laterality: N/A;  ? ERCP N/A 06/12/2018  ? Procedure: ENDOSCOPIC RETROGRADE CHOLANGIOPANCREATOGRAPHY (ERCP);  Surgeon: MClarene Essex MD;  [facility];  Service: Endoscopy;  Laterality: N/A;  ? ESOPHAGOGASTRODUODENOSCOPY N/A 06/12/2018  ? Procedure: ESOPHAGOGASTRODUODENOSCOPY (EGD);  Surgeon: MClarene Essex MD;  [facility];  Service: Endoscopy;  Laterality: N/A;  ? laser vein surgery    ?  last year  ? REMOVAL OF STONES  06/12/2018  ? Procedure: REMOVAL OF STONES;  Surgeon: Clarene Essex, MD;  [facility];  Service: Endoscopy;;  ? REMOVAL OF STONES  02/19/2020  ? Procedure: REMOVAL OF STONES;  Surgeon: Jackquline Denmark, MD;  Location: [place];  Service: Endoscopy;;  ? REMOVAL OF STONES  10/15/2021  ? Procedure: REMOVAL OF STONES;   Surgeon: Jackquline Denmark, MD;  [facility];  Service: Gastroenterology;;  ? SPHINCTEROTOMY  06/12/2018  ? Procedure: SPHINCTEROTOMY;  Surgeon: Clarene Essex, MD;  [facility];  Service: Endoscopy;;  ? SPHINCTEROTOMY  02/19/2020  ? Procedure: SPHINCTEROTOMY;  Surgeon: Jackquline Denmark, MD;  [facility];  Service: Endoscopy;;  ? SPYGLASS CHOLANGIOSCOPY N/A 10/15/2021  ? Procedure: SPYGLASS CHOLANGIOSCOPY;  Surgeon: Jackquline Denmark, MD;  [facility];  Service: Gastroenterology;  Laterality: N/A;  ? SPYGLASS LITHOTRIPSY N/A 10/15/2021  ? Procedure: SPYGLASS LITHOTRIPSY;  Surgeon: Jackquline Denmark, MD;  Location: [facility] END[facility]COPY;  Service: Gastroenterology;  Laterality: N/A;  ? TUBAL LIGATION    ? VARICOSE VEIN SURGERY Left   ? ?Social History  ? ?Socioeconomic History  ? Marital status: Married  ?  Spouse name: Nathaneil Canary  ? Number of children: 4  ? Years of education: Not on file  ? Highest education level: Not on file  ?Occupational History  ? Not on file  ?Tobacco Use  ? Smoking status: Former  ?  Packs/day: 0.50  ?  Years: 35.00  ?  Pack years: 17.50  ?  Types: Cigarettes  ? Smokeless tobacco: Never  ? Tobacco comments:  ?  No smoking since 02/2016  ?Vaping Use  ? Vaping Use: Never used  ?Substance and Sexual Activity  ? Alcohol use: No  ? Drug use: Never  ? Sexual activity: Not Currently  ?  Partners: Male  ?  Birth control/protection: Post-menopausal, Surgical  ?  Comment: BTL  ?Other Topics Concern  ? Not on file  ?Social History Narrative  ? Not on file  ? ?Social Determinants of Health  ? ?Financial Resource Strain: Low Risk   ? Difficulty of Paying Living Expenses: Not hard at all  ?Food Insecurity: No Food Insecurity  ? Worried About Charity fundraiser in the Last Year: Never true  ? Ran Out of Food in the Last Year: Never true  ?Transportation Needs: No Transportation Needs  ? Lack of Transportation (Medical): No  ? Lack of Transportation (Non-Medical): No  ?Physical Activity:  Sufficiently Active  ? Days of Exercise per Week: 7 days  ? Minutes of Exercise per Session: 30 min  ?Stress: No Stress Concern Present  ? Feeling of Stress : Only a little  ?Social Connections: Moderately Isolated  ? Frequency of Communication with Friends and Family: More than three times a week  ? Frequency of Social Gatherings with Friends and Family: Twice a week  ? Attends Religious Services: Never  ? Active Member of Clubs or Organizations: No  ? Attends Archivist Meetings: Never  ? Marital Status: Married  ?Intimate Partner Violence: Not At Risk  ? Fear of Current or Ex-Partner: No  ? Emotionally Abused: No  ? Physically Abused: No  ? Sexually Abused: No  ? ?Family Status  ?Relation Name Status  ? Mother Aundra Millet Deceased  ? Father  Deceased  ? Sister  Alive  ? Neg Hx  (Not Specified)  ? ?Family History  ?Problem Relation Age of Onset  ? Memory loss Mother   ?  Ovarian cancer Mother   ? Cancer Mother   ? Colon cancer Neg Hx   ? Breast cancer Neg Hx   ? Endometrial cancer Neg Hx   ? Pancreatic cancer Neg Hx   ? Prostate cancer Neg Hx   ? ?Allergies  ?Allergen Reactions  ? Oxycodone Nausea And Vomiting and Other (See Comments)  ?  Also made patient lightheaded and lethargic  ? Potassium Itching and Rash  ?  Patient refuses PO and IV K   ? Tape Rash  ?  adhesive  ?  ?Patient Care Team: ?Gabe Glace, Charlene Brooke, NP as PCP - General (Internal Medicine)  ? ?Medications: ?Outpatient Medications Prior to Visit  ?Medication Sig  ? acetaminophen (TYLENOL) 500 MG tablet Take 500 mg by mouth every 6 (six) hours as needed for mild pain or moderate pain.  ? Calcium Carbonate-Vit D-Min (CALCIUM 1200 PO) Take 1 tablet by mouth daily.  ? cetirizine (ZYRTEC) 10 MG tablet Take 10 mg by mouth daily as needed for allergies.  ? famotidine (PEPCID) 20 MG tablet TAKE 1 TABLET BY MOUTH EVERYDAY AT BEDTIME  ? ibuprofen (ADVIL) 600 MG tablet Take 600 mg by mouth as needed.  ? Multiple Vitamin (MULTIVITAMIN WITH  MINERALS) TABS tablet Take 1 tablet by mouth daily with lunch.  ? omeprazole (PRILOSEC) 40 MG capsule TAKE 1 CAPSULE BY MOUTH EVERY DAY (Patient taking differently: Take 40 mg by mouth daily. TAKE 1 CAPSULE BY MOUTH E

## 2021-11-19 NOTE — Assessment & Plan Note (Signed)
appt on 11/22/21 with [facility] to discuss oophrectomy and possible hysterectomy, ?

## 2021-11-20 LAB — LIPID PANEL
Cholesterol: 266 mg/dL — ABNORMAL HIGH (ref <200)
HDL: 61 mg/dL (ref >=50)
LDL Cholesterol (Calc): 159 mg/dL (calc) — ABNORMAL HIGH
Non-HDL Cholesterol (Calc): 205 mg/dL (calc) — ABNORMAL HIGH (ref <130)
Total CHOL/HDL Ratio: 4.4 (calc) (ref <5.0)
Triglycerides: 278 mg/dL — ABNORMAL HIGH (ref <150)

## 2021-11-22 ENCOUNTER — Inpatient Hospital Stay (HOSPITAL_BASED_OUTPATIENT_CLINIC_OR_DEPARTMENT_OTHER): Payer: Medicare HMO | Admitting: Gynecologic Oncology

## 2021-11-22 ENCOUNTER — Encounter: Payer: Self-pay | Admitting: Gynecologic Oncology

## 2021-11-22 ENCOUNTER — Inpatient Hospital Stay: Payer: Medicare HMO

## 2021-11-22 ENCOUNTER — Other Ambulatory Visit: Payer: Self-pay

## 2021-11-22 ENCOUNTER — Inpatient Hospital Stay: Payer: Medicare HMO | Attending: Gynecologic Oncology | Admitting: Gynecologic Oncology

## 2021-11-22 VITALS — BP 124/77 | HR 70 | Temp 98.4°F | Ht 61.0 in | Wt 121.0 lb

## 2021-11-22 DIAGNOSIS — N839 Noninflammatory disorder of ovary, fallopian tube and broad ligament, unspecified: Secondary | ICD-10-CM

## 2021-11-22 DIAGNOSIS — Z780 Asymptomatic menopausal state: Secondary | ICD-10-CM | POA: Diagnosis not present

## 2021-11-22 DIAGNOSIS — Z8041 Family history of malignant neoplasm of ovary: Secondary | ICD-10-CM | POA: Insufficient documentation

## 2021-11-22 DIAGNOSIS — Z79899 Other long term (current) drug therapy: Secondary | ICD-10-CM | POA: Diagnosis not present

## 2021-11-22 DIAGNOSIS — K219 Gastro-esophageal reflux disease without esophagitis: Secondary | ICD-10-CM | POA: Diagnosis not present

## 2021-11-22 DIAGNOSIS — Z87891 Personal history of nicotine dependence: Secondary | ICD-10-CM

## 2021-11-22 DIAGNOSIS — D3911 Neoplasm of uncertain behavior of right ovary: Secondary | ICD-10-CM | POA: Insufficient documentation

## 2021-11-22 DIAGNOSIS — Z9049 Acquired absence of other specified parts of digestive tract: Secondary | ICD-10-CM | POA: Diagnosis not present

## 2021-11-22 DIAGNOSIS — I8390 Asymptomatic varicose veins of unspecified lower extremity: Secondary | ICD-10-CM | POA: Diagnosis not present

## 2021-11-22 LAB — CEA (IN HOUSE-CHCC): CEA (CHCC-In House): 2.52 ng/mL (ref 0.00–5.00)

## 2021-11-22 MED ORDER — IBUPROFEN 600 MG PO TABS: 600.0000 mg | ORAL_TABLET | Freq: Four times a day (QID) | ORAL | 0 refills | Status: DC | PRN

## 2021-11-22 MED ORDER — SENNOSIDES-DOCUSATE SODIUM 8.6-50 MG PO TABS: 2.0000 | ORAL_TABLET | Freq: Every day | ORAL | 0 refills | Status: DC

## 2021-11-22 MED ORDER — TRAMADOL HCL 50 MG PO TABS: 50.0000 mg | ORAL_TABLET | Freq: Four times a day (QID) | ORAL | 0 refills | Status: DC | PRN

## 2021-11-22 NOTE — H&P (View-Only) (Signed)
GYNECOLOGIC ONCOLOGY NEW PATIENT CONSULTATION  ? ?Patient Name: Jasmine Bradford  ?Patient Age: 66 y.o. ?Date of Service: 11/22/21 ?Referring Provider: Princess Bruins, MD ? ?Primary Care Provider: Flossie Buffy, NP ?Consulting Provider: Jeral Pinch, MD  ? ?Assessment/Plan:  ?Postmenopausal patient with complex adnexal mass, family history of ovarian cancer. ? ?Discussed imaging findings with the patient, both her CT scan and pelvic ultrasound.  While there are some suspicious features of the adnexal mass on ultrasound, we discussed that the only way to make a definitive diagnosis is with surgical excision.  Her recent CT scan does not show any definitive evidence of metastatic disease.  Although her CA-125 was normal, I discussed the limitations of this test.  It is not approved for diagnosis.  In up to 50% of patients with early stage ovarian cancer, this blood test can be normal.  It can also be elevated in many noncancerous disease processes.  We will plan to get a CEA prior to surgery as well. ? ?Discussed plan at the time of surgery.  Given her family history of ovarian cancer, I would recommend that we proceed with BSO at minimum.  If no malignancy or borderline tumor found, no additional procedures would be indicated.  If a borderline tumor is identified on frozen section, then additional peritoneal omental biopsies will be performed +/-concurrent hysterectomy.  In the setting of malignancy, then concurrent total hysterectomy, lymph node sampling, omentectomy, and peritoneal biopsies would be recommended. ? ?We will plan for surgery in early May, after her GI procedure.  She thinks that if it looks like the temporary stent has been functioning well that they will likely place a permanent stent. ? ?Given family history, I recommended referral to genetic counselor to discuss genetic testing.  Discussed her 5% risk of ovarian cancer given first-degree relative with ovarian cancer.  Ambulatory  referral to genetics was placed today. ? ?We discussed the plan for for a robotic assisted bilateral salpingo--oophorectomy, possible total hysterectomy, possible staging including lymph node evaluation, possible laparotomy. The risks of surgery were discussed in detail and she understands these to include infection; wound separation; hernia; vaginal cuff separation, injury to adjacent organs such as bowel, bladder, blood vessels, ureters and nerves; bleeding which may require blood transfusion; anesthesia risk; thromboembolic events; possible death; unforeseen complications; possible need for re-exploration; medical complications such as heart attack, stroke, pleural effusion and pneumonia; and, if full lymphadenectomy is performed the risk of lymphedema and lymphocyst. The patient will receive DVT and antibiotic prophylaxis as indicated. She voiced a clear understanding. She had the opportunity to ask questions. Perioperative instructions were reviewed with her. Prescriptions for post-op medications were sent to her pharmacy of choice. ? ?A copy of this note was sent to the patient's referring provider.  ? ?60 minutes of total time was spent for this patient encounter, including preparation, face-to-face counseling with the patient and coordination of care, and documentation of the encounter. ? ? ?Jeral Pinch, MD  ?Division of Gynecologic Oncology  ?Department of Obstetrics and Gynecology  ?[facility]  ?___________________________________________  ?Chief Complaint: ?Chief Complaint  ?Patient presents with  ? Lesion of right ovary  ? ? ?History of Present Illness:  ?Jasmine Bradford is a 66 y.o. y.o. female who is seen in consultation at the request of Dr. Dellis Filbert for an evaluation of complex adnexal mass. ? ?The patient has a history of cholecystectomy in 2017 who was recently admitted with recurrent abdominal pain.  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  Incidental finding of a complex adnexal mass that had grown some in size (4.5cm) since prior imaging (cyst was last noted to be 3.6 centimeters and stable in size on 01/2020).   ? ?CA-125 on 3/17 was normal (9). ? ?Patient reports overall doing very well.  She endorses a good appetite without nausea or emesis.  She denies any early satiety or bloating.  She reports normal bowel and bladder function.  Went through menopause in her 58s, denies any postmenopausal bleeding.  Denies any pelvic pain. ? ?The patient is scheduled for ERCP with possible removal of her temporary stent (placed the time of her recent hospitalization) and possible placement of a permanent stent.  The patient needs to have a number of teeth pulled secondary to erosion due to severe GERD. ? ?Family history is notable for ovarian cancer in her mother. ? ?PAST MEDICAL HISTORY:  ?Past Medical History:  ?Diagnosis Date  ? Allergy   ? Cholelithiasis with choledocholithiasis   ? Diverticulitis   ? GERD (gastroesophageal reflux disease)   ? Tobacco abuse   ? Varicose vein of leg   ? no problems now  ? Wears glasses   ?  ? ?PAST SURGICAL HISTORY:  ?Past Surgical History:  ?Procedure Laterality Date  ? BILIARY DILATION  02/19/2020  ? Procedure: BILIARY DILATION;  Surgeon: Jackquline Denmark, MD;  Location: [facility] ENDOSCOPY;  Service: Endoscopy;;  ? BILIARY DILATION  10/15/2021  ? Procedure: BILIARY DILATION;  Surgeon: Jackquline Denmark, MD;  [facility];  Service: Gastroenterology;;  ? BILIARY STENT PLACEMENT  10/15/2021  ? Procedure: BILIARY STENT PLACEMENT;  Surgeon: Jackquline Denmark, MD;  Location: [place];  Service: Gastroenterology;;  ? CESAREAN SECTION    ? CHOLECYSTECTOMY N/A 05/23/2016  ? Procedure: LAPAROSCOPIC CHOLECYSTECTOMY WITH INTRAOPERATIVE CHOLANGIOGRAM;  Surgeon: Jackolyn Confer, MD;  Location: [place];  Service: General;  Laterality: N/A;  ? ENDOSCOPIC RETROGRADE  CHOLANGIOPANCREATOGRAPHY (ERCP) WITH PROPOFOL N/A 06/06/2016  ? Procedure: ENDOSCOPIC RETROGRADE CHOLANGIOPANCREATOGRAPHY (ERCP) WITH PROPOFOL;  Surgeon: Clarene Essex, MD;  Location: [facility] END[facility]COPY;  Service: Endoscopy;  Laterality: N/A;  ? ENDOSCOPIC RETROGRADE CHOLANGIOPANCREATOGRAPHY (ERCP) WITH PROPOFOL N/A 02/19/2020  ? Procedure: ENDOSCOPIC RETROGRADE CHOLANGIOPANCREATOGRAPHY (ERCP) WITH PROPOFOL;  Surgeon: Jackquline Denmark, MD;  [facility];  Service: Endoscopy;  Laterality: N/A;  ? ENDOSCOPIC RETROGRADE CHOLANGIOPANCREATOGRAPHY (ERCP) WITH PROPOFOL N/A 10/15/2021  ? Procedure: ENDOSCOPIC RETROGRADE CHOLANGIOPANCREATOGRAPHY (ERCP) WITH PROPOFOL;  Surgeon: Jackquline Denmark, MD;  Location: [place];  Service: Gastroenterology;  Laterality: N/A;  ? ERCP N/A 03/09/2016  ? Procedure: ENDOSCOPIC RETROGRADE CHOLANGIOPANCREATOGRAPHY (ERCP);  Surgeon: Clarene Essex, MD;  Location[facility];  Service: Endoscopy;  Laterality: N/A;  ? ERCP N/A 06/12/2018  ? Procedure: ENDOSCOPIC RETROGRADE CHOLANGIOPANCREATOGRAPHY (ERCP);  Surgeon: Clarene Essex, MD;  Location: [facility];  Service: Endoscopy;  Laterality: N/A;  ? ESOPHAGOGASTRODUODENOSCOPY N/A 06/12/2018  ? Procedure: ESOPHAGOGASTRODUODENOSCOPY (EGD);  Surgeon: Clarene Essex, MD;  Location: [facility];  Service: Endoscopy;  Laterality: N/A;  ? laser vein surgery    ? last year  ? REMOVAL OF STONES  06/12/2018  ? Procedure: REMOVAL OF STONES;  Surgeon: Clarene Essex, MD;  Location: [facility] ENDOSCOPY;  Service: Endoscopy;;  ? REMOVAL OF STONES  02/19/2020  ? Procedure: REMOVAL OF STONES;  Surgeon: Jackquline Denmark, MD;  Location: [place];  Service: Endoscopy;;  ? REMOVAL OF STONES  10/15/2021  ? Procedure: REMOVAL OF STONES;  Surgeon: Jackquline Denmark, MD;  Location: [place] ENDOSCOPY;  Service: Gastroenterology;;  ? SPHINCTEROTOMY  06/12/2018  ? Procedure: SPHINCTEROTOMY;  Surgeon: Clarene Essex, MD;  Location: [facility];  Service: Endoscopy;;  ? SPHINCTEROTOMY  02/19/2020  ?  Procedure: SPHINCTEROTOMY;  Surgeon: Jackquline Denmark, MD;  [facility]COPY;  Service: Endoscopy;;  ? SPYGLASS CHOLANGIOSCOPY N/A 10/15/2021  ? Procedure: SPYGLASS CHOLANGIOSCOPY;  Surgeon: Jackquline Denmark,

## 2021-11-22 NOTE — Patient Instructions (Addendum)
Preparing for your Surgery ? ?Plan for surgery on Dec 21, 2021 with Dr. Jeral Pinch at [place] will be scheduled for robotic assisted laparoscopic bilateral salpingo-oophorectomy (removal of the ovaries and fallopian tubes), possible robotic assisted total hysterectomy (removal of the uterus and cervix), possible staging if a cancer is identified, possible laparotomy (larger incision on your abdomen if needed).  ? ?Plan to proceed with scheduled GI procedure. After this, if we get a cancellation for surgery on a sooner date, we will call and offer to move your surgery up. ? ?Pre-operative Testing ?-You will receive a phone call from presurgical testing at [facility] to arrange for a pre-operative appointment and lab work. ? ?-Bring your insurance card, copy of an advanced directive if applicable, medication list ? ?-At that visit, you will be asked to sign a consent for a possible blood transfusion in case a transfusion becomes necessary during surgery.  The need for a blood transfusion is rare but having consent is a necessary part of your care.    ? ?-You should not be taking blood thinners or aspirin at least ten days prior to surgery unless instructed by your surgeon. ? ?-Do not take supplements such as fish oil (omega 3), red yeast rice, turmeric before your surgery. You want to avoid medications with aspirin in them including headache powders such as BC or Goody's), Excedrin migraine. ? ?Day Before Surgery at Home ?-You will be asked to take in a light diet the day before surgery. You will be advised you can have clear liquids up until 3 hours before your surgery.   ? ?Eat a light diet the day before surgery.  Examples including soups, broths, toast, yogurt, mashed potatoes.  AVOID GAS PRODUCING FOODS. Things to avoid include carbonated beverages (fizzy beverages, sodas), raw fruits and raw vegetables (uncooked), or beans.  ? ?If your bowels are filled with gas, your surgeon  will have difficulty visualizing your pelvic organs which increases your surgical risks. ? ?Your role in recovery ?Your role is to become active as soon as directed by your doctor, while still giving yourself time to heal.  Rest when you feel tired. You will be asked to do the following in order to speed your recovery: ? ?- Cough and breathe deeply. This helps to clear and expand your lungs and can prevent pneumonia after surgery.  ?- STAY ACTIVE WHEN YOU GET HOME. Do mild physical activity. Walking or moving your legs help your circulation and body functions return to normal. Do not try to get up or walk alone the first time after surgery.   ?-If you develop swelling on one leg or the other, pain in the back of your leg, redness/warmth in one of your legs, please call the office or go to the Emergency Room to have a doppler to rule out a blood clot. For shortness of breath, chest pain-seek care in the Emergency Room as soon as possible. ?- Actively manage your pain. Managing your pain lets you move in comfort. We will ask you to rate your pain on a scale of zero to 10. It is your responsibility to tell your doctor or nurse where and how much you hurt so your pain can be treated. ? ?Special Considerations ?-If you are diabetic, you may be placed on insulin after surgery to have closer control over your blood sugars to promote healing and recovery.  This does not mean that you will be discharged on insulin.  If  applicable, your oral antidiabetics will be resumed when you are tolerating a solid diet. ? ?-Your final pathology results from surgery should be available around one week after surgery and the results will be relayed to you when available. ? ?-Dr. Lahoma Crocker is the surgeon that assists your GYN Oncologist with surgery.  If you end up staying the night, the next day after your surgery you will either see Dr. Berline Lopes or Dr. Lahoma Crocker. ? ?-FMLA forms can be faxed to [phone] and please allow  5-7 business days for completion. ? ?Pain Management After Surgery ?-You have been prescribed your pain medication (tramadol) and bowel regimen medications before surgery so that you can have these available when you are discharged from the hospital. The pain medication is for use ONLY AFTER surgery and a new prescription will not be given.  ? ?-Make sure that you have Tylenol and Ibuprofen at home to use on a regular basis after surgery for pain control. We recommend alternating the medications every hour to six hours since they work differently and are processed in the body differently for pain relief. ? ?-Review the attached handout on narcotic use and their risks and side effects.  ? ?Bowel Regimen ?-You have been prescribed Sennakot-S to take nightly to prevent constipation especially if you are taking the narcotic pain medication intermittently.  It is important to prevent constipation and drink adequate amounts of liquids. You can stop taking this medication when you are not taking pain medication and you are back on your normal bowel routine. ? ?Risks of Surgery ?Risks of surgery are low but include bleeding, infection, damage to surrounding structures, re-operation, blood clots, and very rarely death. ? ? ?Blood Transfusion Information (For the consent to be signed before surgery) ? ?We will be checking your blood type before surgery so in case of emergencies, we will know what type of blood you would need. ? ?                                          WHAT IS A BLOOD TRANSFUSION? ? ?A transfusion is the replacement of blood or some of its parts. Blood is made up of multiple cells which provide different functions. ?Red blood cells carry oxygen and are used for blood loss replacement. ?White blood cells fight against infection. ?Platelets control bleeding. ?Plasma helps clot blood. ?Other blood products are available for specialized needs, such as hemophilia or other clotting disorders. ?BEFORE THE  TRANSFUSION  ?Who gives blood for transfusions?  ?You may be able to donate blood to be used at a later date on yourself (autologous donation). ?Relatives can be asked to donate blood. This is generally not any safer than if you have received blood from a stranger. The same precautions are taken to ensure safety when a relative's blood is donated. ?Healthy volunteers who are fully evaluated to make sure their blood is safe. This is blood bank blood. ?Transfusion therapy is the safest it has ever been in the practice of medicine. Before blood is taken from a donor, a complete history is taken to make sure that person has no history of diseases nor engages in risky social behavior (examples are intravenous drug use or sexual activity with multiple partners). The donor's travel history is screened to minimize risk of transmitting infections, such as malaria. The donated blood is tested for signs of infectious diseases,  such as HIV and hepatitis. The blood is then tested to be sure it is compatible with you in order to minimize the chance of a transfusion reaction. If you or a relative donates blood, this is often done in anticipation of surgery and is not appropriate for emergency situations. It takes many days to process the donated blood. ?RISKS AND COMPLICATIONS ?Although transfusion therapy is very safe and saves many lives, the main dangers of transfusion include:  ?Getting an infectious disease. ?Developing a transfusion reaction. This is an allergic reaction to something in the blood you were given. Every precaution is taken to prevent this. ?The decision to have a blood transfusion has been considered carefully by your caregiver before blood is given. Blood is not given unless the benefits outweigh the risks. ? ?AFTER SURGERY INSTRUCTIONS ? ?Return to work: 2-4 weeks if applicable and able to return to work with lifting restriction ? ?Activity: ?1. Be up and out of the bed during the day.  Take a nap if needed.   You may walk up steps but be careful and use the hand rail.  Stair climbing will tire you more than you think, you may need to stop part way and rest.  ? ?2. No lifting or straining for 6 weeks over 10 pounds.

## 2021-11-22 NOTE — Progress Notes (Signed)
GYNECOLOGIC ONCOLOGY NEW PATIENT CONSULTATION  ? ?Patient Name: Jasmine Bradford  ?Patient Age: 66 y.o. ?Date of Service: 11/22/21 ?Referring Provider: Princess Bruins, MD ? ?Primary Care Provider: Flossie Buffy, NP ?Consulting Provider: Jeral Pinch, MD  ? ?Assessment/Plan:  ?Postmenopausal patient with complex adnexal mass, family history of ovarian cancer. ? ?Discussed imaging findings with the patient, both her CT scan and pelvic ultrasound.  While there are some suspicious features of the adnexal mass on ultrasound, we discussed that the only way to make a definitive diagnosis is with surgical excision.  Her recent CT scan does not show any definitive evidence of metastatic disease.  Although her CA-125 was normal, I discussed the limitations of this test.  It is not approved for diagnosis.  In up to 50% of patients with early stage ovarian cancer, this blood test can be normal.  It can also be elevated in many noncancerous disease processes.  We will plan to get a CEA prior to surgery as well. ? ?Discussed plan at the time of surgery.  Given her family history of ovarian cancer, I would recommend that we proceed with BSO at minimum.  If no malignancy or borderline tumor found, no additional procedures would be indicated.  If a borderline tumor is identified on frozen section, then additional peritoneal omental biopsies will be performed +/-concurrent hysterectomy.  In the setting of malignancy, then concurrent total hysterectomy, lymph node sampling, omentectomy, and peritoneal biopsies would be recommended. ? ?We will plan for surgery in early May, after her GI procedure.  She thinks that if it looks like the temporary stent has been functioning well that they will likely place a permanent stent. ? ?Given family history, I recommended referral to genetic counselor to discuss genetic testing.  Discussed her 5% risk of ovarian cancer given first-degree relative with ovarian cancer.  Ambulatory  referral to genetics was placed today. ? ?We discussed the plan for for a robotic assisted bilateral salpingo--oophorectomy, possible total hysterectomy, possible staging including lymph node evaluation, possible laparotomy. The risks of surgery were discussed in detail and she understands these to include infection; wound separation; hernia; vaginal cuff separation, injury to adjacent organs such as bowel, bladder, blood vessels, ureters and nerves; bleeding which may require blood transfusion; anesthesia risk; thromboembolic events; possible death; unforeseen complications; possible need for re-exploration; medical complications such as heart attack, stroke, pleural effusion and pneumonia; and, if full lymphadenectomy is performed the risk of lymphedema and lymphocyst. The patient will receive DVT and antibiotic prophylaxis as indicated. She voiced a clear understanding. She had the opportunity to ask questions. Perioperative instructions were reviewed with her. Prescriptions for post-op medications were sent to her pharmacy of choice. ? ?A copy of this note was sent to the patient's referring provider.  ? ?60 minutes of total time was spent for this patient encounter, including preparation, face-to-face counseling with the patient and coordination of care, and documentation of the encounter. ? ? ?Jeral Pinch, MD  ?Division of Gynecologic Oncology  ?Department of Obstetrics and Gynecology  ?[facility]  ?___________________________________________  ?Chief Complaint: ?Chief Complaint  ?Patient presents with  ? Lesion of right ovary  ? ? ?History of Present Illness:  ?Jasmine Bradford is a 66 y.o. y.o. female who is seen in consultation at the request of Dr. Dellis Filbert for an evaluation of complex adnexal mass. ? ?The patient has a history of cholecystectomy in 2017 who was recently admitted with recurrent abdominal pain.  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  Incidental finding of a complex adnexal mass that had grown some in size (4.5cm) since prior imaging (cyst was last noted to be 3.6 centimeters and stable in size on 01/2020).   ? ?CA-125 on 3/17 was normal (9). ? ?Patient reports overall doing very well.  She endorses a good appetite without nausea or emesis.  She denies any early satiety or bloating.  She reports normal bowel and bladder function.  Went through menopause in her 63s, denies any postmenopausal bleeding.  Denies any pelvic pain. ? ?The patient is scheduled for ERCP with possible removal of her temporary stent (placed the time of her recent hospitalization) and possible placement of a permanent stent.  The patient needs to have a number of teeth pulled secondary to erosion due to severe GERD. ? ?Family history is notable for ovarian cancer in her mother. ? ?PAST MEDICAL HISTORY:  ?Past Medical History:  ?Diagnosis Date  ? Allergy   ? Cholelithiasis with choledocholithiasis   ? Diverticulitis   ? GERD (gastroesophageal reflux disease)   ? Tobacco abuse   ? Varicose vein of leg   ? no problems now  ? Wears glasses   ?  ? ?PAST SURGICAL HISTORY:  ?Past Surgical History:  ?Procedure Laterality Date  ? BILIARY DILATION  02/19/2020  ? Procedure: BILIARY DILATION;  Surgeon: Jackquline Denmark, MD;  Location: [facility] ENDOSCOPY;  Service: Endoscopy;;  ? BILIARY DILATION  10/15/2021  ? Procedure: BILIARY DILATION;  Surgeon: Jackquline Denmark, MD;  Location: [facility] ENDOSCOPY;  Service: Gastroenterology;;  ? BILIARY STENT PLACEMENT  10/15/2021  ? Procedure: BILIARY STENT PLACEMENT;  Surgeon: Jackquline Denmark, MD;  Location: [place];  Service: Gastroenterology;;  ? CESAREAN SECTION    ? CHOLECYSTECTOMY N/A 05/23/2016  ? Procedure: LAPAROSCOPIC CHOLECYSTECTOMY WITH INTRAOPERATIVE CHOLANGIOGRAM;  Surgeon: Jackolyn Confer, MD;  Location: [place];  Service: General;  Laterality: N/A;  ? ENDOSCOPIC RETROGRADE  CHOLANGIOPANCREATOGRAPHY (ERCP) WITH PROPOFOL N/A 06/06/2016  ? Procedure: ENDOSCOPIC RETROGRADE CHOLANGIOPANCREATOGRAPHY (ERCP) WITH PROPOFOL;  Surgeon: Clarene Essex, MD;  Location: [facility] ENDOSCOPY;  Service: Endoscopy;  Laterality: N/A;  ? ENDOSCOPIC RETROGRADE CHOLANGIOPANCREATOGRAPHY (ERCP) WITH PROPOFOL N/A 02/19/2020  ? Procedure: ENDOSCOPIC RETROGRADE CHOLANGIOPANCREATOGRAPHY (ERCP) WITH PROPOFOL;  Surgeon: Jackquline Denmark, MD;  Location: [facility] ENDOSCOPY;  Service: Endoscopy;  Laterality: N/A;  ? ENDOSCOPIC RETROGRADE CHOLANGIOPANCREATOGRAPHY (ERCP) WITH PROPOFOL N/A 10/15/2021  ? Procedure: ENDOSCOPIC RETROGRADE CHOLANGIOPANCREATOGRAPHY (ERCP) WITH PROPOFOL;  Surgeon: Jackquline Denmark, MD;  Location: [place];  Service: Gastroenterology;  Laterality: N/A;  ? ERCP N/A 03/09/2016  ? Procedure: ENDOSCOPIC RETROGRADE CHOLANGIOPANCREATOGRAPHY (ERCP);  Surgeon: Clarene Essex, MD;  Location: [facility] ENDOSCOPY;  Service: Endoscopy;  Laterality: N/A;  ? ERCP N/A 06/12/2018  ? Procedure: ENDOSCOPIC RETROGRADE CHOLANGIOPANCREATOGRAPHY (ERCP);  Surgeon: Clarene Essex, MD;  Location: [facility] ENDOSCOPY;  Service: Endoscopy;  Laterality: N/A;  ? ESOPHAGOGASTRODUODENOSCOPY N/A 06/12/2018  ? Procedure: ESOPHAGOGASTRODUODENOSCOPY (EGD);  Surgeon: Clarene Essex, MD;  Location: [facility] ENDOSCOPY;  Service: Endoscopy;  Laterality: N/A;  ? laser vein surgery    ? last year  ? REMOVAL OF STONES  06/12/2018  ? Procedure: REMOVAL OF STONES;  Surgeon: Clarene Essex, MD;  Location: [facility] ENDOSCOPY;  Service: Endoscopy;;  ? REMOVAL OF STONES  02/19/2020  ? Procedure: REMOVAL OF STONES;  Surgeon: Jackquline Denmark, MD;  Location: [place];  Service: Endoscopy;;  ? REMOVAL OF STONES  10/15/2021  ? Procedure: REMOVAL OF STONES;  Surgeon: Jackquline Denmark, MD;  Location: [place] ENDOSCOPY;  Service: Gastroenterology;;  ? SPHINCTEROTOMY  06/12/2018  ? Procedure: SPHINCTEROTOMY;  Surgeon: Clarene Essex, MD;  Location: [facility] ENDOSCOPY;  Service: Endoscopy;;  ? SPHINCTEROTOMY  02/19/2020  ?  Procedure: SPHINCTEROTOMY;  Surgeon: Jackquline Denmark, MD;  Location: [facility] ENDOSCOPY;  Service: Endoscopy;;  ? SPYGLASS CHOLANGIOSCOPY N/A 10/15/2021  ? Procedure: SPYGLASS CHOLANGIOSCOPY;  Surgeon: Jackquline Denmark,

## 2021-11-22 NOTE — Progress Notes (Signed)
Patient here for new patient consultation with Dr. Jeral Pinch and for a pre-operative discussion prior to her scheduled surgery on Dec 21, 2021. She is scheduled for robotic assisted laparoscopic bilateral salpingo-oophorectomy, possible robotic assisted total hysterectomy, possible staging if a cancer is identified, possible laparotomy. The surgery was discussed in detail.  See after visit summary for additional details. Visual aids used to discuss items related to surgery including sequential compression stockings, foley catheter, IV pump, multi-modal pain regimen including tylenol, photo of the surgical robot, female reproductive system to discuss surgery in detail.    ?  ?Discussed post-op pain management in detail including the aspects of the enhanced recovery pathway.  Advised her that a new prescription would be sent in for tramadol and it is only to be used for after her upcoming surgery.  We discussed the use of tylenol post-op (advised to use sparingly given intermittent increases in liver function labs related to cholelithiasis) and to monitor for a maximum of 4,000 mg in a 24 hour period.  Also prescribed sennakot to be used after surgery and to hold if having loose stools.  Discussed bowel regimen in detail.   ?  ?Discussed the use of SCDs and measures to take at home to prevent DVT including frequent mobility.  Reportable signs and symptoms of DVT discussed. Post-operative instructions discussed and expectations for after surgery. Incisional care discussed as well including reportable signs and symptoms including erythema, drainage, wound separation.  ?   ?5 minutes spent with the patient.  Verbalizing understanding of material discussed. No needs or concerns voiced at the end of the visit. Advised patient to call for any needs.  Advised that her post-operative medications had been prescribed and could be picked up at any time.   ? ?This appointment is included in the global surgical bundle as  pre-operative teaching and has no charge.     ?

## 2021-11-22 NOTE — Patient Instructions (Signed)
Preparing for your Surgery ?  ?Plan for surgery on Dec 21, 2021 with Dr. Jeral Pinch at [facility] will be scheduled for robotic assisted laparoscopic bilateral salpingo-oophorectomy (removal of the ovaries and fallopian tubes), possible robotic assisted total hysterectomy (removal of the uterus and cervix), possible staging if a cancer is identified, possible laparotomy (larger incision on your abdomen if needed).  ?  ?Plan to proceed with scheduled GI procedure. After this, if we get a cancellation for surgery on a sooner date, we will call and offer to move your surgery up. ?  ?Pre-operative Testing ?-You will receive a phone call from presurgical testing at [facility] to arrange for a pre-operative appointment and lab work. ?  ?-Bring your insurance card, copy of an advanced directive if applicable, medication list ?  ?-At that visit, you will be asked to sign a consent for a possible blood transfusion in case a transfusion becomes necessary during surgery.  The need for a blood transfusion is rare but having consent is a necessary part of your care.    ?  ?-You should not be taking blood thinners or aspirin at least ten days prior to surgery unless instructed by your surgeon. ?  ?-Do not take supplements such as fish oil (omega 3), red yeast rice, turmeric before your surgery. You want to avoid medications with aspirin in them including headache powders such as BC or Goody's), Excedrin migraine. ?  ?Day Before Surgery at Home ?-You will be asked to take in a light diet the day before surgery. You will be advised you can have clear liquids up until 3 hours before your surgery.   ?  ?Eat a light diet the day before surgery.  Examples including soups, broths, toast, yogurt, mashed potatoes.  AVOID GAS PRODUCING FOODS. Things to avoid include carbonated beverages (fizzy beverages, sodas), raw fruits and raw vegetables (uncooked), or beans.  ?  ?If your bowels are filled with gas, your  surgeon will have difficulty visualizing your pelvic organs which increases your surgical risks. ?  ?Your role in recovery ?Your role is to become active as soon as directed by your doctor, while still giving yourself time to heal.  Rest when you feel tired. You will be asked to do the following in order to speed your recovery: ?  ?- Cough and breathe deeply. This helps to clear and expand your lungs and can prevent pneumonia after surgery.  ?- STAY ACTIVE WHEN YOU GET HOME. Do mild physical activity. Walking or moving your legs help your circulation and body functions return to normal. Do not try to get up or walk alone the first time after surgery.   ?-If you develop swelling on one leg or the other, pain in the back of your leg, redness/warmth in one of your legs, please call the office or go to the Emergency Room to have a doppler to rule out a blood clot. For shortness of breath, chest pain-seek care in the Emergency Room as soon as possible. ?- Actively manage your pain. Managing your pain lets you move in comfort. We will ask you to rate your pain on a scale of zero to 10. It is your responsibility to tell your doctor or nurse where and how much you hurt so your pain can be treated. ?  ?Special Considerations ?-If you are diabetic, you may be placed on insulin after surgery to have closer control over your blood sugars to promote healing and recovery.  This does not mean that you will be discharged on insulin.  If applicable, your oral antidiabetics will be resumed when you are tolerating a solid diet. ?  ?-Your final pathology results from surgery should be available around one week after surgery and the results will be relayed to you when available. ?  ?-Dr. Lahoma Crocker is the surgeon that assists your GYN Oncologist with surgery.  If you end up staying the night, the next day after your surgery you will either see Dr. Berline Lopes or Dr. Lahoma Crocker. ?  ?-FMLA forms can be faxed to [phone] and  please allow 5-7 business days for completion. ?  ?Pain Management After Surgery ?-You have been prescribed your pain medication (tramadol) and bowel regimen medications before surgery so that you can have these available when you are discharged from the hospital. The pain medication is for use ONLY AFTER surgery and a new prescription will not be given.  ?  ?-Make sure that you have Tylenol and Ibuprofen at home to use on a regular basis after surgery for pain control. We recommend alternating the medications every hour to six hours since they work differently and are processed in the body differently for pain relief. ?  ?-Review the attached handout on narcotic use and their risks and side effects.  ?  ?Bowel Regimen ?-You have been prescribed Sennakot-S to take nightly to prevent constipation especially if you are taking the narcotic pain medication intermittently.  It is important to prevent constipation and drink adequate amounts of liquids. You can stop taking this medication when you are not taking pain medication and you are back on your normal bowel routine. ?  ?Risks of Surgery ?Risks of surgery are low but include bleeding, infection, damage to surrounding structures, re-operation, blood clots, and very rarely death. ?  ?  ?Blood Transfusion Information (For the consent to be signed before surgery) ?  ?We will be checking your blood type before surgery so in case of emergencies, we will know what type of blood you would need. ?  ?                                          WHAT IS A BLOOD TRANSFUSION? ?  ?A transfusion is the replacement of blood or some of its parts. Blood is made up of multiple cells which provide different functions. ?Red blood cells carry oxygen and are used for blood loss replacement. ?White blood cells fight against infection. ?Platelets control bleeding. ?Plasma helps clot blood. ?Other blood products are available for specialized needs, such as hemophilia or other clotting  disorders. ?BEFORE THE TRANSFUSION  ?Who gives blood for transfusions?  ?You may be able to donate blood to be used at a later date on yourself (autologous donation). ?Relatives can be asked to donate blood. This is generally not any safer than if you have received blood from a stranger. The same precautions are taken to ensure safety when a relative's blood is donated. ?Healthy volunteers who are fully evaluated to make sure their blood is safe. This is blood bank blood. ?Transfusion therapy is the safest it has ever been in the practice of medicine. Before blood is taken from a donor, a complete history is taken to make sure that person has no history of diseases nor engages in risky social behavior (examples are intravenous drug use or sexual activity with multiple  partners). The donor's travel history is screened to minimize risk of transmitting infections, such as malaria. The donated blood is tested for signs of infectious diseases, such as HIV and hepatitis. The blood is then tested to be sure it is compatible with you in order to minimize the chance of a transfusion reaction. If you or a relative donates blood, this is often done in anticipation of surgery and is not appropriate for emergency situations. It takes many days to process the donated blood. ?RISKS AND COMPLICATIONS ?Although transfusion therapy is very safe and saves many lives, the main dangers of transfusion include:  ?Getting an infectious disease. ?Developing a transfusion reaction. This is an allergic reaction to something in the blood you were given. Every precaution is taken to prevent this. ?The decision to have a blood transfusion has been considered carefully by your caregiver before blood is given. Blood is not given unless the benefits outweigh the risks. ?  ?AFTER SURGERY INSTRUCTIONS ?  ?Return to work: 2-4 weeks if applicable and able to return to work with lifting restriction ?  ?Activity: ?1. Be up and out of the bed during the  day.  Take a nap if needed.  You may walk up steps but be careful and use the hand rail.  Stair climbing will tire you more than you think, you may need to stop part way and rest.  ?  ?2. No lifting or strainin

## 2021-11-30 ENCOUNTER — Telehealth: Payer: Self-pay

## 2021-11-30 NOTE — Telephone Encounter (Signed)
error 

## 2021-12-02 ENCOUNTER — Encounter (HOSPITAL_COMMUNITY): Payer: Self-pay | Admitting: Gastroenterology

## 2021-12-02 ENCOUNTER — Telehealth: Payer: Self-pay

## 2021-12-02 NOTE — Telephone Encounter (Signed)
Following up with Ms. Valone this afternoon. Advised patient that her FMLA paperwork has been completed and successfully faxed to [facility] at [phone]. ? ?Advised patient that per Dr. Berline Lopes there has to be a medical reason to proceed with a hysterectomy. She recommends proceeding with the plan and if path results prompt the need for a hysterectomy, this will be performed. Patient verbalized understanding of all of the above information. Instructed to call with any questions or concerns. ? ?Post-op appointment rescheduled to June 1st per patient request.  ? ?

## 2021-12-06 NOTE — Progress Notes (Addendum)
DUE TO COVID-19 ONLY  2  VISITOR IS ALLOWED TO COME WITH YOU AND STAY IN THE WAITING ROOM ONLY DURING PRE OP AND PROCEDURE DAY OF SURGERY.   4 VISITOR  MAY VISIT WITH YOU AFTER SURGERY IN YOUR PRIVATE ROOM DURING VISITING HOURS ONLY! ?YOU MAY HAVE ONE PERSON SPEND THE NITE WITH YOU IN YOUR ROOM AFTER SURGERY.   ? ? ? Your procedure is scheduled on:  ?  12/21/2021  ? Report to [facility] Main  Entrance ? ? Report to admitting at     0515            AM ?DO NOT [place], PICTURE ID OR WALLET DAY OF SURGERY.  ?  ? ? Call this number if you have problems the morning of surgery [phone]  ? ? EAt a light diet the day befor esurgeryl.  Avoid gas producing foods.   ? ? REMEMBER: NO  SOLID FOODS , CANDY, GUM OR MINTS AFTER MIDNITE THE NITE BEFORE SURGERY .       [place] [employer] CLEAR LIQUIDS UNTIL     0430am           DAY OF SURGERY.   ?   ?Foods Allowed      ?WATER ?BLACK COFFEE ( SUGAR OK, NO MILK, CREAM OR CREAMER) REGULAR AND DECAF  ?TEA ( SUGAR OK NO MILK, CREAM, OR CREAMER) REGULAR AND DECAF  ?PLAIN JELLO ( NO RED)  ?FRUIT ICES ( NO RED, NO FRUIT PULP)  ?POPSICLES ( NO RED)  ?JUICE- APPLE, WHITE GRAPE AND WHITE CRANBERRY  ?SPORT DRINK LIKE GATORADE ( NO RED)  ?CLEAR BROTH ( VEGETABLE , CHICKEN OR BEEF)                                                               ? ?    ? ?BRUSH YOUR TEETH MORNING OF SURGERY AND RINSE YOUR MOUTH OUT, NO CHEWING GUM CANDY OR MINTS. ?  ? ? Take these medicines the morning of surgery with A SIP OF WATER:  omeprazole  ? ? ?DO NOT TAKE ANY DIABETIC MEDICATIONS DAY OF YOUR SURGERY ?                  ?            You may not have any metal on your body including hair pins and  ?            piercings  Do not wear jewelry, make-up, lotions, powders or perfumes, deodorant ?            Do not wear nail polish on your fingernails.   ?           IF YOU ARE A FEMALE AND WANT TO SHAVE UNDER ARMS OR LEGS PRIOR TO SURGERY YOU MUST DO SO AT LEAST 48 HOURS PRIOR TO SURGERY.  ?            Men may  shave face and neck. ? ? Do not bring valuables to the hospital. [facility] NOT ?            RESPONSIBLE   FOR VALUABLES. ? Contacts, dentures or bridgework may not be worn into surgery. ? Leave suitcase in the car. After  surgery it may be brought to your room. ? ?  ? Patients discharged the day of surgery will not be allowed to drive home. IF YOU ARE HAVING SURGERY AND GOING HOME THE SAME DAY, YOU MUST HAVE AN ADULT TO DRIVE YOU HOME AND BE WITH YOU FOR 24 HOURS. YOU MAY GO HOME BY TAXI OR UBER OR ORTHERWISE, BUT AN ADULT MUST ACCOMPANY YOU HOME AND STAY WITH YOU FOR 24 HOURS. ?  ? ?            Please read over the following fact sheets you were given: ?_____________________________________________________________________ ? ?[facility] - Preparing for Surgery ?Before surgery, you can play an important role.  Because skin is not sterile, your skin needs to be as free of germs as possible.  You can reduce the number of germs on your skin by washing with CHG (chlorahexidine gluconate) soap before surgery.  CHG is an antiseptic cleaner which kills germs and bonds with the skin to continue killing germs even after washing. ?Please DO NOT use if you have an allergy to CHG or antibacterial soaps.  If your skin becomes reddened/irritated stop using the CHG and inform your nurse when you arrive at Short Stay. ?Do not shave (including legs and underarms) for at least 48 hours prior to the first CHG shower.  You may shave your face/neck. ?Please follow these instructions carefully: ? 1.  Shower with CHG Soap the night before surgery and the  morning of Surgery. ? 2.  If you choose to wash your hair, wash your hair first as usual with your  normal  shampoo. ? 3.  After you shampoo, rinse your hair and body thoroughly to remove the  shampoo.                           4.  Use CHG as you would any other liquid soap.  You can apply chg directly  to the skin and wash  ?                     Gently with a scrungie or clean  washcloth. ? 5.  Apply the CHG Soap to your body ONLY FROM THE NECK DOWN.   Do not use on face/ open      ?                     Wound or open sores. Avoid contact with eyes, ears mouth and genitals (private parts).  ?                     Production manager,  Genitals (private parts) with your normal soap. ?            6.  Wash thoroughly, paying special attention to the area where your surgery  will be performed. ? 7.  Thoroughly rinse your body with warm water from the neck down. ? 8.  DO NOT shower/wash with your normal soap after using and rinsing off  the CHG Soap. ?               9.  Pat yourself dry with a clean towel. ?           10.  Wear clean pajamas. ?           11.  Place clean sheets on your bed the night of your first shower and do not  sleep  with pets. ?Day of Surgery : ?Do not apply any lotions/deodorants the morning of surgery.  Please wear clean clothes to the hospital/surgery center. ? ?FAILURE TO FOLLOW THESE INSTRUCTIONS MAY RESULT IN THE CANCELLATION OF YOUR SURGERY ?PATIENT SIGNATURE_________________________________ ? ?NURSE SIGNATURE__________________________________ ? ?________________________________________________________________________  ? ? ?           ?

## 2021-12-06 NOTE — Progress Notes (Signed)
Anesthesia Review: ? ?PCP: ?Cardiologist : ?Chest x-ray : 10/14/21  ?EKG : 10/14/21  ?Echo : 2021  ?Stress test: ?Cardiac Cath :  ?Activity level:  ?Sleep Study/ CPAP : ?Fasting Blood Sugar :      / Checks Blood Sugar -- times a day:   ?Blood Thinner/ Instructions /Last Dose: ?ASA / Instructions/ Last Dose :   ?12/09/21- ERCP  ?

## 2021-12-08 ENCOUNTER — Other Ambulatory Visit: Payer: Self-pay

## 2021-12-08 ENCOUNTER — Encounter (HOSPITAL_COMMUNITY): Payer: Self-pay

## 2021-12-08 ENCOUNTER — Encounter (HOSPITAL_COMMUNITY)
Admission: RE | Admit: 2021-12-08 | Discharge: 2021-12-08 | Disposition: A | Discharge status: Home / Self Care | Payer: Medicare HMO | Source: Ambulatory Visit | Attending: Gynecologic Oncology | Admitting: Gynecologic Oncology

## 2021-12-08 DIAGNOSIS — Z8041 Family history of malignant neoplasm of ovary: Secondary | ICD-10-CM

## 2021-12-08 DIAGNOSIS — N83209 Unspecified ovarian cyst, unspecified side: Secondary | ICD-10-CM

## 2021-12-08 DIAGNOSIS — Z01812 Encounter for preprocedural laboratory examination: Secondary | ICD-10-CM | POA: Diagnosis not present

## 2021-12-08 LAB — TYPE AND SCREEN
ABO/RH(D): A POS
Antibody Screen: NEGATIVE

## 2021-12-08 LAB — COMPREHENSIVE METABOLIC PANEL
ALT: 19 U/L (ref 0–44)
AST: 21 U/L (ref 15–41)
Albumin: 3.8 g/dL (ref 3.5–5.0)
Alkaline Phosphatase: 65 U/L (ref 38–126)
Anion gap: 5 (ref 5–15)
BUN: 15 mg/dL (ref 8–23)
CO2: 28 mmol/L (ref 22–32)
Calcium: 8.9 mg/dL (ref 8.9–10.3)
Chloride: 106 mmol/L (ref 98–111)
Creatinine, Ser: 0.85 mg/dL (ref 0.44–1.00)
GFR, Estimated: >60 mL/min (ref >60)
Glucose, Bld: 90 mg/dL (ref 70–99)
Potassium: 3.9 mmol/L (ref 3.5–5.1)
Sodium: 139 mmol/L (ref 135–145)
Total Bilirubin: 0.6 mg/dL (ref 0.3–1.2)
Total Protein: 6.9 g/dL (ref 6.5–8.1)

## 2021-12-08 LAB — CBC
HCT: 39.9 % (ref 36.0–46.0)
Hemoglobin: 13 g/dL (ref 12.0–15.0)
MCH: 28.4 pg (ref 26.0–34.0)
MCHC: 32.6 g/dL (ref 30.0–36.0)
MCV: 87.3 fL (ref 80.0–100.0)
Platelets: 235 10*3/uL (ref 150–400)
RBC: 4.57 MIL/uL (ref 3.87–5.11)
RDW: 13.2 % (ref 11.5–15.5)
WBC: 5.1 10*3/uL (ref 4.0–10.5)
nRBC: 0 % (ref 0.0–0.2)

## 2021-12-09 ENCOUNTER — Ambulatory Visit (HOSPITAL_COMMUNITY): Payer: Medicare HMO | Admitting: Certified Registered Nurse Anesthetist

## 2021-12-09 ENCOUNTER — Ambulatory Visit (HOSPITAL_COMMUNITY): Payer: Medicare HMO

## 2021-12-09 ENCOUNTER — Ambulatory Visit (HOSPITAL_COMMUNITY)
Admission: RE | Admit: 2021-12-09 | Discharge: 2021-12-09 | Disposition: A | Discharge status: Home / Self Care | Payer: Medicare HMO | Attending: Gastroenterology | Admitting: Gastroenterology

## 2021-12-09 ENCOUNTER — Encounter (HOSPITAL_COMMUNITY)
Admission: RE | Disposition: A | Discharge status: Home / Self Care | Payer: Self-pay | Source: Home / Self Care | Attending: Gastroenterology

## 2021-12-09 ENCOUNTER — Encounter (HOSPITAL_COMMUNITY): Payer: Self-pay | Admitting: Gastroenterology

## 2021-12-09 ENCOUNTER — Ambulatory Visit (HOSPITAL_BASED_OUTPATIENT_CLINIC_OR_DEPARTMENT_OTHER): Payer: Medicare HMO | Admitting: Certified Registered Nurse Anesthetist

## 2021-12-09 DIAGNOSIS — Z87891 Personal history of nicotine dependence: Secondary | ICD-10-CM | POA: Diagnosis not present

## 2021-12-09 DIAGNOSIS — K8050 Calculus of bile duct without cholangitis or cholecystitis without obstruction: Secondary | ICD-10-CM

## 2021-12-09 DIAGNOSIS — R932 Abnormal findings on diagnostic imaging of liver and biliary tract: Secondary | ICD-10-CM | POA: Insufficient documentation

## 2021-12-09 DIAGNOSIS — K219 Gastro-esophageal reflux disease without esophagitis: Secondary | ICD-10-CM | POA: Diagnosis not present

## 2021-12-09 DIAGNOSIS — Z9049 Acquired absence of other specified parts of digestive tract: Secondary | ICD-10-CM | POA: Diagnosis not present

## 2021-12-09 DIAGNOSIS — K8051 Calculus of bile duct without cholangitis or cholecystitis with obstruction: Secondary | ICD-10-CM | POA: Insufficient documentation

## 2021-12-09 DIAGNOSIS — K838 Other specified diseases of biliary tract: Secondary | ICD-10-CM | POA: Diagnosis not present

## 2021-12-09 DIAGNOSIS — Z4659 Encounter for fitting and adjustment of other gastrointestinal appliance and device: Secondary | ICD-10-CM | POA: Diagnosis not present

## 2021-12-09 DIAGNOSIS — K828 Other specified diseases of gallbladder: Secondary | ICD-10-CM | POA: Diagnosis not present

## 2021-12-09 DIAGNOSIS — K831 Obstruction of bile duct: Secondary | ICD-10-CM | POA: Diagnosis not present

## 2021-12-09 HISTORY — PX: REMOVAL OF STONES: SHX5545

## 2021-12-09 HISTORY — PX: LITHOTRIPSY: SHX5546

## 2021-12-09 HISTORY — PX: SPYGLASS CHOLANGIOSCOPY: SHX5441

## 2021-12-09 HISTORY — PX: STENT REMOVAL: SHX6421

## 2021-12-09 HISTORY — PX: ENDOSCOPIC RETROGRADE CHOLANGIOPANCREATOGRAPHY (ERCP) WITH PROPOFOL: SHX5810

## 2021-12-09 HISTORY — PX: BILIARY DILATION: SHX6850

## 2021-12-09 HISTORY — PX: BILIARY BRUSHING: SHX6843

## 2021-12-09 SURGERY — ENDOSCOPIC RETROGRADE CHOLANGIOPANCREATOGRAPHY (ERCP) WITH PROPOFOL
Anesthesia: General

## 2021-12-09 MED ORDER — CIPROFLOXACIN IN D5W 400 MG/200ML IV SOLN
INTRAVENOUS | Status: DC | PRN
  Administered 2021-12-09: 400 mg via INTRAVENOUS

## 2021-12-09 MED ORDER — SUGAMMADEX SODIUM 200 MG/2ML IV SOLN
INTRAVENOUS | Status: DC | PRN
Start: 2021-12-09 — End: 2021-12-09
  Administered 2021-12-09: 110 mg via INTRAVENOUS

## 2021-12-09 MED ORDER — FENTANYL CITRATE (PF) 100 MCG/2ML IJ SOLN
INTRAMUSCULAR | Status: DC | PRN
  Administered 2021-12-09 (×2): 50 ug via INTRAVENOUS

## 2021-12-09 MED ORDER — LIDOCAINE 2% (20 MG/ML) 5 ML SYRINGE
INTRAMUSCULAR | Status: DC | PRN
  Administered 2021-12-09: 60 mg via INTRAVENOUS

## 2021-12-09 MED ORDER — FENTANYL CITRATE (PF) 100 MCG/2ML IJ SOLN: 25.0000 ug | INTRAMUSCULAR | Status: DC | PRN

## 2021-12-09 MED ORDER — GLUCAGON HCL RDNA (DIAGNOSTIC) 1 MG IJ SOLR
INTRAMUSCULAR | Status: AC
Start: 2021-12-09 — End: ?
  Filled 2021-12-09: qty 1

## 2021-12-09 MED ORDER — ONDANSETRON HCL 4 MG/2ML IJ SOLN
INTRAMUSCULAR | Status: DC | PRN
  Administered 2021-12-09: 4 mg via INTRAVENOUS

## 2021-12-09 MED ORDER — ONDANSETRON HCL 4 MG/2ML IJ SOLN: 4.0000 mg | Freq: Once | INTRAMUSCULAR | Status: DC | PRN

## 2021-12-09 MED ORDER — GLUCAGON HCL RDNA (DIAGNOSTIC) 1 MG IJ SOLR
INTRAMUSCULAR | Status: DC | PRN
  Administered 2021-12-09 (×2): .25 mg via INTRAVENOUS

## 2021-12-09 MED ORDER — SODIUM CHLORIDE 0.9 % IV SOLN
INTRAVENOUS | Status: DC | PRN
  Administered 2021-12-09: 100 mL

## 2021-12-09 MED ORDER — DEXAMETHASONE SODIUM PHOSPHATE 10 MG/ML IJ SOLN
INTRAMUSCULAR | Status: DC | PRN
Start: 2021-12-09 — End: 2021-12-09
  Administered 2021-12-09: 5 mg via INTRAVENOUS

## 2021-12-09 MED ORDER — PHENYLEPHRINE 80 MCG/ML (10ML) SYRINGE FOR IV PUSH (FOR BLOOD PRESSURE SUPPORT)
PREFILLED_SYRINGE | INTRAVENOUS | Status: DC | PRN
  Administered 2021-12-09 (×7): 80 ug via INTRAVENOUS

## 2021-12-09 MED ORDER — CIPROFLOXACIN IN D5W 400 MG/200ML IV SOLN
INTRAVENOUS | Status: AC
  Filled 2021-12-09: qty 200

## 2021-12-09 MED ORDER — ROCURONIUM BROMIDE 10 MG/ML (PF) SYRINGE
PREFILLED_SYRINGE | INTRAVENOUS | Status: DC | PRN
  Administered 2021-12-09: 50 mg via INTRAVENOUS
  Administered 2021-12-09: 10 mg via INTRAVENOUS

## 2021-12-09 MED ORDER — DICLOFENAC SUPPOSITORY 100 MG
RECTAL | Status: DC | PRN
  Administered 2021-12-09: 100 mg via RECTAL

## 2021-12-09 MED ORDER — INDOMETHACIN 50 MG RE SUPP
RECTAL | Status: AC
  Filled 2021-12-09: qty 2

## 2021-12-09 MED ORDER — EPHEDRINE SULFATE-NACL 50-0.9 MG/10ML-% IV SOSY
PREFILLED_SYRINGE | INTRAVENOUS | Status: DC | PRN
  Administered 2021-12-09: 5 mg via INTRAVENOUS

## 2021-12-09 MED ORDER — PROPOFOL 10 MG/ML IV BOLUS
INTRAVENOUS | Status: DC | PRN
  Administered 2021-12-09: 150 mg via INTRAVENOUS
  Administered 2021-12-09 (×2): 10 mg via INTRAVENOUS
  Administered 2021-12-09: 20 mg via INTRAVENOUS
  Administered 2021-12-09: 10 mg via INTRAVENOUS

## 2021-12-09 MED ORDER — DICLOFENAC SUPPOSITORY 100 MG
RECTAL | Status: AC
  Filled 2021-12-09: qty 1

## 2021-12-09 MED ORDER — LACTATED RINGERS IV SOLN
INTRAVENOUS | Status: AC | PRN
  Administered 2021-12-09: 1000 mL via INTRAVENOUS

## 2021-12-09 NOTE — Anesthesia Procedure Notes (Signed)
Procedure Name: Intubation ?Date/Time: 12/09/2021 7:51 AM ?Performed by: Janene Harvey, CRNA ?Pre-anesthesia Checklist: Patient identified, Emergency Drugs available, Suction available and Patient being monitored ?Patient Re-evaluated:Patient Re-evaluated prior to induction ?Oxygen Delivery Method: Circle system utilized ?Preoxygenation: Pre-oxygenation with 100% oxygen ?Induction Type: IV induction ?Ventilation: Mask ventilation without difficulty ?Laryngoscope Size: Mac and 4 ?Grade View: Grade I ?Tube type: Oral ?Tube size: 7.0 mm ?Number of attempts: 1 ?Airway Equipment and Method: Stylet and Oral airway ?Placement Confirmation: ETT inserted through vocal cords under direct vision, positive ETCO2 and breath sounds checked- equal and bilateral ?Secured at: 22 cm ?Tube secured with: Tape ?Dental Injury: Teeth and Oropharynx as per pre-operative assessment  ? ? ? ? ?

## 2021-12-09 NOTE — Transfer of Care (Signed)
Immediate Anesthesia Transfer of Care Note ? ?Patient: Jasmine Bradford ? ?Procedure(s) Performed: ENDOSCOPIC RETROGRADE CHOLANGIOPANCREATOGRAPHY (ERCP) WITH PROPOFOL ?SPYGLASS CHOLANGIOSCOPY ?STENT REMOVAL ?REMOVAL OF STONES ?LITHOTRIPSY ?BILIARY DILATION ?BILIARY BRUSHING ? ?Patient Location: PACU ? ?Anesthesia Type:General ? ?Level of Consciousness: drowsy and patient cooperative ? ?Airway & Oxygen Therapy: Patient Spontanous Breathing and Patient connected to face mask oxygen ? ?Post-op Assessment: Report given to RN and Post -op Vital signs reviewed and stable ? ?Post vital signs: Reviewed and stable ? ?Last Vitals:  ?Vitals Value Taken Time  ?BP 118/74 12/09/21 0947  ?Temp    ?Pulse 92 12/09/21 0949  ?Resp 20 12/09/21 0949  ?SpO2 100 % 12/09/21 0949  ?Vitals shown include unvalidated device data. ? ?Last Pain:  ?Vitals:  ? 12/09/21 0653  ?PainSc: 0-No pain  ?   ? ?  ? ?Complications: No notable events documented. ?

## 2021-12-09 NOTE — Anesthesia Preprocedure Evaluation (Addendum)
Anesthesia Evaluation  ?Patient identified by MRN, date of birth, ID band ?Patient awake ? ? ? ?Reviewed: ?Allergy & Precautions, NPO status , Patient's Chart, lab work & pertinent test results ? ?History of Anesthesia Complications ?Negative for: history of anesthetic complications ? ?Airway ?Mallampati: II ? ?TM Distance: >3 FB ?Neck ROM: Full ? ? ? Dental ? ?(+) Dental Advisory Given, Poor Dentition, Chipped, Missing ?  ?Pulmonary ?former smoker,  ?  ?Pulmonary exam normal ? ? ? ? ? ? ? Cardiovascular ?negative cardio ROS ?Normal cardiovascular exam ? ? ?  ?Neuro/Psych ?negative neurological ROS ? negative psych ROS  ? GI/Hepatic ?Neg liver ROS, GERD  Medicated and Controlled,  ?Endo/Other  ?negative endocrine ROS ? Renal/GU ?negative Renal ROS  ? ?  ?Musculoskeletal ?negative musculoskeletal ROS ?(+)  ? Abdominal ?  ?Peds ? Hematology ?negative hematology ROS ?(+)   ?Anesthesia Other Findings ? ? Reproductive/Obstetrics ? ?  ? ? ? ? ? ? ? ? ? ? ? ? ? ?  ?  ? ? ? ? ? ? ? ?Anesthesia Physical ?Anesthesia Plan ? ?ASA: 2 ? ?Anesthesia Plan: General  ? ?Post-op Pain Management: Minimal or no pain anticipated  ? ?Induction: Intravenous ? ?PONV Risk Score and Plan: 3 and Treatment may vary due to age or medical condition, Ondansetron and Dexamethasone ? ?Airway Management Planned: Oral ETT ? ?Additional Equipment: None ? ?Intra-op Plan:  ? ?Post-operative Plan: Extubation in OR ? ?Informed Consent: I have reviewed the patients History and Physical, chart, labs and discussed the procedure including the risks, benefits and alternatives for the proposed anesthesia with the patient or authorized representative who has indicated his/her understanding and acceptance.  ? ? ? ?Dental advisory given ? ?Plan Discussed with: CRNA and Anesthesiologist ? ?Anesthesia Plan Comments:   ? ? ? ? ? ?Anesthesia Quick Evaluation ? ?

## 2021-12-09 NOTE — Op Note (Signed)
[facility] ?Patient Name: Jasmine Bradford ?Procedure Date : 12/09/2021 ?MRN: [id number] ?Attending MD: Justice Britain , MD ?Date of Birth: 19-Aug-1956 ?CSN: [id number] ?Age: 66 ?Admit Type: Inpatient ?Procedure:                ERCP ?Indications:              Bile duct stone(s), Prior Endoscopic Retrograde  ?                          Cholangiopancreatography ?Providers:                Justice Britain, MD, Jeanella Cara, RN,  ?                          William Dalton, Technician ?Referring MD:             Jackquline Denmark, MD, Gerrit Heck, MD, Baldo Ash  ?                          Lum Nche ?Medicines:                General Anesthesia, Cipro 400 mg IV, Diclofenac 100  ?                          mg rectal ?Complications:            No immediate complications. ?Estimated Blood Loss:     Estimated blood loss was minimal. ?Procedure:                Pre-Anesthesia Assessment: ?                          - Prior to the procedure, a History and Physical  ?                          was performed, and patient medications and  ?                          allergies were reviewed. The patient's tolerance of  ?                          previous anesthesia was also reviewed. The risks  ?                          and benefits of the procedure and the sedation  ?                          options and risks were discussed with the patient.  ?                          All questions were answered, and informed consent  ?                          was obtained. Prior Anticoagulants: The patient has  ?                          taken no previous  anticoagulant or antiplatelet  ?                          agents except for NSAID medication. ASA Grade  ?                          Assessment: III - A patient with severe systemic  ?                          disease. After reviewing the risks and benefits,  ?                          the patient was deemed in satisfactory condition to  ?                          undergo the  procedure. ?                          After obtaining informed consent, the scope was  ?                          passed under direct vision. Throughout the  ?                          procedure, the patient's blood pressure, pulse, and  ?                          oxygen saturations were monitored continuously. The  ?                          TJF-Q190V ([id number]) Olympus duodenoscope was  ?                          introduced through the mouth, and used to inject  ?                          contrast into and used for direct visualization of  ?                          the bile duct. The ERCP was accomplished without  ?                          difficulty. The patient tolerated the procedure. ?Scope In: ?Scope Out: ?Findings: ?     A scout film of the abdomen was obtained. Surgical clips, consistent  ?     with previous cholecystectomy, were seen in the area of the right upper  ?     quadrant of the abdomen. One stent ending in the main bile duct was seen. ?     The esophagus was successfully intubated under direct vision without  ?     detailed examination of the pharynx, larynx, and associated structures,  ?     and upper GI tract. A biliary sphincterotomy had been performed. The  ?     sphincterotomy appeared open. One plastic biliary stent originating in  ?  the biliary tree was emerging from the major papilla. The stent was  ?     visibly patent. One stent was removed from the biliary tree using a  ?     snare and sent for cytology. ?     A short 0.035 inch Soft Jagwire was passed into the biliary tree. The  ?     Hydratome sphincterotome was passed over the guidewire and the bile duct  ?     was then deeply cannulated. Contrast was injected. I personally  ?     interpreted the bile duct images. Ductal flow of contrast was adequate.  ?     Image quality was adequate. Contrast extended to the hepatic ducts.  ?     Opacification of the entire biliary tree except for the cystic duct and  ?     gallbladder was  successful. The middle third of the main bile duct and  ?     upper third of the main bile duct were severely dilated. The largest  ?     diameter was 18 mm. The lower third of the main bile duct contained a  ?     segmental irregularity suggestive of significant angulation but not  ?     overt stricture. The middle third of the main bile duct and upper third  ?     of the main bile duct contained filling defects thought to be stones and  ?     sludge. To discover objects, the biliary tree was swept with a retrieval  ?     balloon. Sludge was swept from the duct. A few stone fragments were  ?     removed. A few stones remained. Dilation of the distal common bile duct  ?     with an 03-30-09 mm x 5.5 cm CRE balloon (to a maximum balloon size of 10  ?     mm) dilator was successful as a sphincteroplasty for total of 2 minutes.  ?     To discover objects, the biliary tree was swept with a retrieval  ?     balloon. Sludge was swept from the duct. A few stone fragments were  ?     removed including a large 1 cm fragment. A few stones remained.  ?     Lithotripsy with a 2.0 cm basket-type device was successful. To discover  ?     objects, the biliary tree was swept with a basket starting at the  ?     bifurcation. Two stone fragments were removed. No stones remained on  ?     contrast injection. ?     Due to concern previously of angulation issues and stones having been  ?     missed, decision made for Spyglass attempt. The bile duct was explored  ?     endoscopically using the SpyGlass direct visualization system. The  ?     SpyScope was advanced to the bifurcation. Visibility with the scope was  ?     good. The left main hepatic duct contained two small stones, the largest  ?     of which was 3 mm in diameter. The lower third of the main bile duct  ?     contained a segmental angulation irregularity but did not show  ?     significant mucosal abnormality at this time. The middle third of the  ?     main bile duct,  upper  third of the main bile duct and hepatic duct  ?     bifurcation were normal. To try and get those stones, as the wire always  ?     preferentially went into the right system, a long 0.035 inch Soft  ?     Antonietta Breach passed successfully into the left main hepatic duct as selective  ?     cannulation with the Spyscope. The Spyscope was removed. ?     The biliary tree was swept with a retrieval balloon starting at the left  ?     main hepatic duct and then the right main hepatic duct. Two small stones  ?     were removed. No stones remained. An occlusion cholangiogram was  ?     performed that showed no further significant biliary pathology. ?     The patient had previous elevated CA19-9 that normalized after stenting,  ?     thus felt less likely to be malignancy, but I went ahead to proceed  ?     cells for cytology were obtained by brushing in the lower third of the  ?     main bile duct. ?     A pancreatogram was not performed. ?     The duodenoscope was withdrawn from the patient. ?Impression:               - Prior biliary sphincterotomy appeared open. One  ?                          visibly patent stent from the biliary tree was seen  ?                          in the major papilla - this was removed. ?                          - Filling defects consistent with stones and sludge  ?                          were seen on the cholangiogram. ?                          - The upper third of the main bile duct and middle  ?                          third of the main bile duct were severely dilated. ?                          - An angulation irregularity was found in the lower  ?                          third of the main bile duct. ?                          - Choledocholithiasis was found. Complete removal  ?                          was accomplished by balloon sphincteroplasty,  ?  balloon sweeping, basket mechanical lithotripsy. ?                          - Cholangioscopy performed and distal  angulation  ?                          irregularity was found. Two small stones in the  ?                          left hepatic duct region. ?                          - Selective cannulation performed into

## 2021-12-09 NOTE — H&P (Signed)
? ?GASTROENTEROLOGY PROCEDURE H&P NOTE  ? ?Primary Care Physician: ?Flossie Buffy, NP ? ?HPI: ?Jasmine Bradford is a 66 y.o. female who presents for ERCP for choledocholithiasis and rule out stricture. ? ?Past Medical History:  ?Diagnosis Date  ? Allergy   ? Cholelithiasis with choledocholithiasis   ? Diverticulitis   ? GERD (gastroesophageal reflux disease)   ? Tobacco abuse   ? Varicose vein of leg   ? no problems now  ? Wears glasses   ? ?Past Surgical History:  ?Procedure Laterality Date  ? BILIARY DILATION  02/19/2020  ? Procedure: BILIARY DILATION;  Surgeon: Jackquline Denmark, MD;  Location: [facility] ENDOSCOPY;  Service: Endoscopy;;  ? BILIARY DILATION  10/15/2021  ? Procedure: BILIARY DILATION;  Surgeon: Jackquline Denmark, MD;  Location: [facility] ENDOSCOPY;  Service: Gastroenterology;;  ? BILIARY STENT PLACEMENT  10/15/2021  ? Procedure: BILIARY STENT PLACEMENT;  Surgeon: Jackquline Denmark, MD;  Location: [place];  Service: Gastroenterology;;  ? CESAREAN SECTION    ? CHOLECYSTECTOMY N/A 05/23/2016  ? Procedure: LAPAROSCOPIC CHOLECYSTECTOMY WITH INTRAOPERATIVE CHOLANGIOGRAM;  Surgeon: Jackolyn Confer, MD;  Location: [place];  Service: General;  Laterality: N/A;  ? ENDOSCOPIC RETROGRADE CHOLANGIOPANCREATOGRAPHY (ERCP) WITH PROPOFOL N/A 06/06/2016  ? Procedure: ENDOSCOPIC RETROGRADE CHOLANGIOPANCREATOGRAPHY (ERCP) WITH PROPOFOL;  Surgeon: Clarene Essex, MD;  Location: [facility] ENDOSCOPY;  Service: Endoscopy;  Laterality: N/A;  ? ENDOSCOPIC RETROGRADE CHOLANGIOPANCREATOGRAPHY (ERCP) WITH PROPOFOL N/A 02/19/2020  ? Procedure: ENDOSCOPIC RETROGRADE CHOLANGIOPANCREATOGRAPHY (ERCP) WITH PROPOFOL;  Surgeon: Jackquline Denmark, MD;  Location: [facility] ENDOSCOPY;  Service: Endoscopy;  Laterality: N/A;  ? ENDOSCOPIC RETROGRADE CHOLANGIOPANCREATOGRAPHY (ERCP) WITH PROPOFOL N/A 10/15/2021  ? Procedure: ENDOSCOPIC RETROGRADE CHOLANGIOPANCREATOGRAPHY (ERCP) WITH PROPOFOL;  Surgeon: Jackquline Denmark, MD;  Location: [place];  Service: Gastroenterology;   Laterality: N/A;  ? ERCP N/A 03/09/2016  ? Procedure: ENDOSCOPIC RETROGRADE CHOLANGIOPANCREATOGRAPHY (ERCP);  Surgeon: Clarene Essex, MD;  Location: [facility] ENDOSCOPY;  Service: Endoscopy;  Laterality: N/A;  ? ERCP N/A 06/12/2018  ? Procedure: ENDOSCOPIC RETROGRADE CHOLANGIOPANCREATOGRAPHY (ERCP);  Surgeon: Clarene Essex, MD;  Location: [facility] ENDOSCOPY;  Service: Endoscopy;  Laterality: N/A;  ? ESOPHAGOGASTRODUODENOSCOPY N/A 06/12/2018  ? Procedure: ESOPHAGOGASTRODUODENOSCOPY (EGD);  Surgeon: Clarene Essex, MD;  Location: [facility] ENDOSCOPY;  Service: Endoscopy;  Laterality: N/A;  ? laser vein surgery    ? last year  ? REMOVAL OF STONES  06/12/2018  ? Procedure: REMOVAL OF STONES;  Surgeon: Clarene Essex, MD;  Location: [facility] ENDOSCOPY;  Service: Endoscopy;;  ? REMOVAL OF STONES  02/19/2020  ? Procedure: REMOVAL OF STONES;  Surgeon: Jackquline Denmark, MD;  Location: [place];  Service: Endoscopy;;  ? REMOVAL OF STONES  10/15/2021  ? Procedure: REMOVAL OF STONES;  Surgeon: Jackquline Denmark, MD;  Location: [facility] ENDOSCOPY;  Service: Gastroenterology;;  ? SPHINCTEROTOMY  06/12/2018  ? Procedure: SPHINCTEROTOMY;  Surgeon: Clarene Essex, MD;  Location: [facility] ENDOSCOPY;  Service: Endoscopy;;  ? SPHINCTEROTOMY  02/19/2020  ? Procedure: SPHINCTEROTOMY;  Surgeon: Jackquline Denmark, MD;  Location: [facility] ENDOSCOPY;  Service: Endoscopy;;  ? SPYGLASS CHOLANGIOSCOPY N/A 10/15/2021  ? Procedure: SPYGLASS CHOLANGIOSCOPY;  Surgeon: Jackquline Denmark, MD;  Location: [facility] ENDOSCOPY;  Service: Gastroenterology;  Laterality: N/A;  ? SPYGLASS LITHOTRIPSY N/A 10/15/2021  ? Procedure: SPYGLASS LITHOTRIPSY;  Surgeon: Jackquline Denmark, MD;  Location: [facility] ENDOSCOPY;  Service: Gastroenterology;  Laterality: N/A;  ? TUBAL LIGATION    ? at time of c-section  ? VARICOSE VEIN SURGERY Left   ? ?Current Facility-Administered Medications  ?Medication Dose Route Frequency Provider Last Rate Last Admin  ? lactated ringers infusion  Continuous PRN Mansouraty, Telford Nab., MD 10 mL/hr at 12/09/21 0703  1,000 mL at 12/09/21 0703  ? ? ?Current Facility-Administered Medications:  ?  lactated ringers infusion, , , Continuous PRN, Mansouraty, Telford Nab., MD, Last Rate: 10 mL/hr at 12/09/21 0703, 1,000 mL at 12/09/21 0703 ?Allergies  ?Allergen Reactions  ? Oxycodone Nausea And Vomiting and Other (See Comments)  ?  Also made patient lightheaded and lethargic  ? Potassium Itching and Rash  ?  Oral and IV   ? Tape Itching, Swelling and Rash  ?  adhesive  ? ?Family History  ?Problem Relation Age of Onset  ? Memory loss Mother   ? Ovarian cancer Mother   ? Cancer Mother   ? Colon cancer Neg Hx   ? Breast cancer Neg Hx   ? Endometrial cancer Neg Hx   ? Pancreatic cancer Neg Hx   ? Prostate cancer Neg Hx   ? ?Social History  ? ?Socioeconomic History  ? Marital status: Married  ?  Spouse name: Nathaneil Canary  ? Number of children: 4  ? Years of education: Not on file  ? Highest education level: Not on file  ?Occupational History  ? Not on file  ?Tobacco Use  ? Smoking status: Former  ?  Packs/day: 0.50  ?  Years: 35.00  ?  Pack years: 17.50  ?  Types: Cigarettes  ? Smokeless tobacco: Never  ? Tobacco comments:  ?  No smoking since 02/2016  ?Vaping Use  ? Vaping Use: Never used  ?Substance and Sexual Activity  ? Alcohol use: No  ? Drug use: Never  ? Sexual activity: Not Currently  ?  Partners: Male  ?  Birth control/protection: Post-menopausal, Surgical  ?  Comment: BTL  ?Other Topics Concern  ? Not on file  ?Social History Narrative  ? Not on file  ? ?Social Determinants of Health  ? ?Financial Resource Strain: Low Risk   ? Difficulty of Paying Living Expenses: Not hard at all  ?Food Insecurity: No Food Insecurity  ? Worried About Charity fundraiser in the Last Year: Never true  ? Ran Out of Food in the Last Year: Never true  ?Transportation Needs: No Transportation Needs  ? Lack of Transportation (Medical): No  ? Lack of Transportation (Non-Medical): No  ?Physical Activity: Sufficiently Active  ? Days of Exercise per Week: 7 days  ?  Minutes of Exercise per Session: 30 min  ?Stress: No Stress Concern Present  ? Feeling of Stress : Only a little  ?Social Connections: Moderately Isolated  ? Frequency of Communication with Friends and Family: More than three times a week  ? Frequency of Social Gatherings with Friends and Family: Twice a week  ? Attends Religious Services: Never  ? Active Member of Clubs or Organizations: No  ? Attends Archivist Meetings: Never  ? Marital Status: Married  ?Intimate Partner Violence: Not At Risk  ? Fear of Current or Ex-Partner: No  ? Emotionally Abused: No  ? Physically Abused: No  ? Sexually Abused: No  ? ? ?Physical Exam: ?Today's Vitals  ? 12/09/21 0653  ?BP: 110/62  ?Pulse: (!) 59  ?Resp: (!) 9  ?Temp: (!) 90.9 ?F (32.7 ?C)  ?SpO2: 98%  ?Weight: 54.4 kg  ?Height: '5\' 1"'$  (1.549 m)  ?PainSc: 0-No pain  ? ?Body mass index is 22.66 kg/m?. ?GEN: NAD ?EYE: Sclerae anicteric ?ENT: MMM ?CV: Non-tachycardic ?GI: Soft, NT/ND ?NEURO:  Alert & Oriented x 3 ? ?Lab Results: ?  Recent Labs  ?  12/08/21 ?0809  ?WBC 5.1  ?HGB 13.0  ?HCT 39.9  ?PLT 235  ? ?BMET ?Recent Labs  ?  12/08/21 ?0809  ?NA 139  ?K 3.9  ?CL 106  ?CO2 28  ?GLUCOSE 90  ?BUN 15  ?CREATININE 0.85  ?CALCIUM 8.9  ? ?LFT ?Recent Labs  ?  12/08/21 ?0809  ?PROT 6.9  ?ALBUMIN 3.8  ?AST 21  ?ALT 19  ?ALKPHOS 65  ?BILITOT 0.6  ? ?PT/INR ?No results for input(s): LABPROT, INR in the last 72 hours. ? ? ?Impression / Plan: ?This is a 66 y.o.female who presents for ERCP for choledocholithiasis and rule out stricture. ? ?The risks of an ERCP were discussed at length, including but not limited to the risk of perforation, bleeding, abdominal pain, post-ERCP pancreatitis (while usually mild can be severe and even life threatening). ? ? ?The risks and benefits of endoscopic evaluation/treatment were discussed with the patient and/or family; these include but are not limited to the risk of perforation, infection, bleeding, missed lesions, lack of diagnosis, severe  illness requiring hospitalization, as well as anesthesia and sedation related illnesses.  The patient's history has been reviewed, patient examined, no change in status, and deemed stable for procedure.  T

## 2021-12-09 NOTE — Anesthesia Postprocedure Evaluation (Signed)
Anesthesia Post Note ? ?Patient: Jasmine Bradford ? ?Procedure(s) Performed: ENDOSCOPIC RETROGRADE CHOLANGIOPANCREATOGRAPHY (ERCP) WITH PROPOFOL ?SPYGLASS CHOLANGIOSCOPY ?STENT REMOVAL ?REMOVAL OF STONES ?LITHOTRIPSY ?BILIARY DILATION ?BILIARY BRUSHING ? ?  ? ?Patient location during evaluation: PACU ?Anesthesia Type: General ?Level of consciousness: awake and alert ?Pain management: pain level controlled ?Vital Signs Assessment: post-procedure vital signs reviewed and stable ?Respiratory status: spontaneous breathing, nonlabored ventilation and respiratory function stable ?Cardiovascular status: stable and blood pressure returned to baseline ?Anesthetic complications: no ? ? ?No notable events documented. ? ?Last Vitals:  ?Vitals:  ? 12/09/21 1003 12/09/21 1017  ?BP: 109/62 102/66  ?Pulse: 84 79  ?Resp: 13 13  ?Temp:  36.6 ?C  ?SpO2: 98% 97%  ?  ?Last Pain:  ?Vitals:  ? 12/09/21 1017  ?PainSc: 0-No pain  ? ? ?  ?  ?  ?  ?  ?  ? ?Audry Pili ? ? ? ? ?

## 2021-12-10 ENCOUNTER — Telehealth: Payer: Self-pay | Admitting: Gynecologic Oncology

## 2021-12-10 LAB — CYTOLOGY - NON PAP

## 2021-12-10 NOTE — Telephone Encounter (Signed)
Called patient to offer her an earlier surgery date of April 26 from May 2. She would like to stay on May 2 since she has her FMLA set for that date. No needs voiced.  ?

## 2021-12-13 ENCOUNTER — Encounter: Payer: Self-pay | Admitting: Gastroenterology

## 2021-12-13 ENCOUNTER — Telehealth: Payer: Self-pay

## 2021-12-13 DIAGNOSIS — K8050 Calculus of bile duct without cholangitis or cholecystitis without obstruction: Secondary | ICD-10-CM

## 2021-12-13 NOTE — Telephone Encounter (Signed)
Lab order entered for 2 weeks out under Dr C ?Follow up appt made for 01/25/22 at 1:20 pm with Dr C ? ?The pt has been advised and all questions answered to the best of my ability ?

## 2021-12-13 NOTE — Telephone Encounter (Signed)
HFP under Dr. Vivia Ewing name in next 2 to 3 weeks.  Follow-up in clinic with Dr. Bryan Lemma in the next 4 to 8 weeks.  Leave MRI decision to Dr. Bryan Lemma.  Thanks.  ?GM  ?

## 2021-12-14 ENCOUNTER — Encounter (HOSPITAL_COMMUNITY): Payer: Self-pay | Admitting: Gastroenterology

## 2021-12-20 ENCOUNTER — Telehealth: Payer: Self-pay | Admitting: *Deleted

## 2021-12-20 ENCOUNTER — Encounter (HOSPITAL_COMMUNITY): Payer: Self-pay | Admitting: Gynecologic Oncology

## 2021-12-20 NOTE — Telephone Encounter (Signed)
Telephone call to check on pre-operative status.  Patient compliant with pre-operative instructions.  Reinforced nothing to eat after midnight. Clear liquids until 0430. Patient to arrive at 0500.  No questions or concerns voiced.  Instructed to call for any needs. Pt stated that she recently hurt her right knee and it's swollen and painful and she'll see the doctor on Friday about it. Pt stated she hasn't been taking anything for it. Dr.Tucker made aware and per Dr.Tucker she may take Tylenol for pain but no ibuprofen. Pt verbalized understanding.  ?

## 2021-12-21 ENCOUNTER — Other Ambulatory Visit: Payer: Self-pay

## 2021-12-21 ENCOUNTER — Encounter (HOSPITAL_COMMUNITY): Payer: Self-pay | Admitting: Gynecologic Oncology

## 2021-12-21 ENCOUNTER — Other Ambulatory Visit: Payer: Medicare HMO

## 2021-12-21 ENCOUNTER — Ambulatory Visit (HOSPITAL_COMMUNITY)
Admission: RE | Admit: 2021-12-21 | Discharge: 2021-12-21 | Disposition: A | Discharge status: Home / Self Care | Payer: Medicare HMO | Attending: Gynecologic Oncology | Admitting: Gynecologic Oncology

## 2021-12-21 ENCOUNTER — Encounter (HOSPITAL_COMMUNITY)
Admission: RE | Disposition: A | Discharge status: Home / Self Care | Payer: Self-pay | Source: Home / Self Care | Attending: Gynecologic Oncology

## 2021-12-21 ENCOUNTER — Ambulatory Visit (HOSPITAL_BASED_OUTPATIENT_CLINIC_OR_DEPARTMENT_OTHER): Payer: Medicare HMO | Admitting: Physician Assistant

## 2021-12-21 ENCOUNTER — Ambulatory Visit (HOSPITAL_BASED_OUTPATIENT_CLINIC_OR_DEPARTMENT_OTHER): Payer: Medicare HMO

## 2021-12-21 ENCOUNTER — Ambulatory Visit (HOSPITAL_COMMUNITY): Payer: Medicare HMO | Admitting: Physician Assistant

## 2021-12-21 DIAGNOSIS — M25461 Effusion, right knee: Secondary | ICD-10-CM | POA: Diagnosis not present

## 2021-12-21 DIAGNOSIS — M79661 Pain in right lower leg: Secondary | ICD-10-CM | POA: Diagnosis not present

## 2021-12-21 DIAGNOSIS — K219 Gastro-esophageal reflux disease without esophagitis: Secondary | ICD-10-CM | POA: Insufficient documentation

## 2021-12-21 DIAGNOSIS — Z9049 Acquired absence of other specified parts of digestive tract: Secondary | ICD-10-CM | POA: Insufficient documentation

## 2021-12-21 DIAGNOSIS — D270 Benign neoplasm of right ovary: Secondary | ICD-10-CM | POA: Insufficient documentation

## 2021-12-21 DIAGNOSIS — R1900 Intra-abdominal and pelvic swelling, mass and lump, unspecified site: Secondary | ICD-10-CM

## 2021-12-21 DIAGNOSIS — M25561 Pain in right knee: Secondary | ICD-10-CM | POA: Diagnosis not present

## 2021-12-21 DIAGNOSIS — Z1502 Genetic susceptibility to malignant neoplasm of ovary: Secondary | ICD-10-CM

## 2021-12-21 DIAGNOSIS — N83201 Unspecified ovarian cyst, right side: Secondary | ICD-10-CM | POA: Diagnosis not present

## 2021-12-21 DIAGNOSIS — N838 Other noninflammatory disorders of ovary, fallopian tube and broad ligament: Secondary | ICD-10-CM | POA: Diagnosis not present

## 2021-12-21 DIAGNOSIS — N858 Other specified noninflammatory disorders of uterus: Secondary | ICD-10-CM | POA: Diagnosis not present

## 2021-12-21 DIAGNOSIS — N83209 Unspecified ovarian cyst, unspecified side: Secondary | ICD-10-CM

## 2021-12-21 DIAGNOSIS — Z8041 Family history of malignant neoplasm of ovary: Secondary | ICD-10-CM | POA: Diagnosis not present

## 2021-12-21 DIAGNOSIS — N839 Noninflammatory disorder of ovary, fallopian tube and broad ligament, unspecified: Secondary | ICD-10-CM | POA: Diagnosis not present

## 2021-12-21 HISTORY — PX: ROBOTIC ASSISTED SALPINGO OOPHERECTOMY: SHX6082

## 2021-12-21 SURGERY — SALPINGO-OOPHORECTOMY, ROBOT-ASSISTED
Anesthesia: General | Site: Abdomen | Laterality: Bilateral

## 2021-12-21 MED ORDER — DEXAMETHASONE SODIUM PHOSPHATE 4 MG/ML IJ SOLN
4.0000 mg | INTRAMUSCULAR | Status: AC
  Administered 2021-12-21: 5 mg via INTRAVENOUS

## 2021-12-21 MED ORDER — FENTANYL CITRATE (PF) 100 MCG/2ML IJ SOLN
INTRAMUSCULAR | Status: DC | PRN
  Administered 2021-12-21 (×3): 50 ug via INTRAVENOUS

## 2021-12-21 MED ORDER — BUPIVACAINE HCL 0.25 % IJ SOLN
INTRAMUSCULAR | Status: DC | PRN
Start: 2021-12-21 — End: 2021-12-21
  Administered 2021-12-21: 32 mL

## 2021-12-21 MED ORDER — KETAMINE HCL 10 MG/ML IJ SOLN
INTRAMUSCULAR | Status: AC
  Filled 2021-12-21: qty 1

## 2021-12-21 MED ORDER — ONDANSETRON HCL 4 MG/2ML IJ SOLN
INTRAMUSCULAR | Status: AC
  Filled 2021-12-21: qty 2

## 2021-12-21 MED ORDER — LACTATED RINGERS IV SOLN: INTRAVENOUS | Status: DC | PRN

## 2021-12-21 MED ORDER — PHENYLEPHRINE 80 MCG/ML (10ML) SYRINGE FOR IV PUSH (FOR BLOOD PRESSURE SUPPORT)
PREFILLED_SYRINGE | INTRAVENOUS | Status: AC
  Filled 2021-12-21: qty 10

## 2021-12-21 MED ORDER — LIDOCAINE HCL (PF) 2 % IJ SOLN
INTRAMUSCULAR | Status: AC
  Filled 2021-12-21: qty 5

## 2021-12-21 MED ORDER — PHENYLEPHRINE HCL-NACL 20-0.9 MG/250ML-% IV SOLN
INTRAVENOUS | Status: AC
  Filled 2021-12-21: qty 500

## 2021-12-21 MED ORDER — BUPIVACAINE HCL 0.25 % IJ SOLN
INTRAMUSCULAR | Status: AC
  Filled 2021-12-21: qty 1

## 2021-12-21 MED ORDER — MEPERIDINE HCL 50 MG/ML IJ SOLN
INTRAMUSCULAR | Status: AC
  Administered 2021-12-21: 6.25 mg via INTRAVENOUS
  Filled 2021-12-21: qty 1

## 2021-12-21 MED ORDER — MIDAZOLAM HCL 2 MG/2ML IJ SOLN
INTRAMUSCULAR | Status: AC
  Filled 2021-12-21: qty 2

## 2021-12-21 MED ORDER — DEXAMETHASONE SODIUM PHOSPHATE 10 MG/ML IJ SOLN
INTRAMUSCULAR | Status: AC
  Filled 2021-12-21: qty 1

## 2021-12-21 MED ORDER — PHENYLEPHRINE HCL (PRESSORS) 10 MG/ML IV SOLN
INTRAVENOUS | Status: AC
  Filled 2021-12-21: qty 1

## 2021-12-21 MED ORDER — HYDROMORPHONE HCL 1 MG/ML IJ SOLN
INTRAMUSCULAR | Status: AC
  Filled 2021-12-21: qty 1

## 2021-12-21 MED ORDER — EPHEDRINE SULFATE-NACL 50-0.9 MG/10ML-% IV SOSY
PREFILLED_SYRINGE | INTRAVENOUS | Status: DC | PRN
  Administered 2021-12-21: 15 mg via INTRAVENOUS
  Administered 2021-12-21: 10 mg via INTRAVENOUS

## 2021-12-21 MED ORDER — CHLORHEXIDINE GLUCONATE 0.12 % MT SOLN
15.0000 mL | Freq: Once | OROMUCOSAL | Status: AC
  Administered 2021-12-21: 15 mL via OROMUCOSAL

## 2021-12-21 MED ORDER — LACTATED RINGERS IV SOLN: INTRAVENOUS | Status: DC

## 2021-12-21 MED ORDER — ROCURONIUM BROMIDE 10 MG/ML (PF) SYRINGE
PREFILLED_SYRINGE | INTRAVENOUS | Status: DC | PRN
Start: 2021-12-21 — End: 2021-12-21
  Administered 2021-12-21: 60 mg via INTRAVENOUS
  Administered 2021-12-21: 20 mg via INTRAVENOUS

## 2021-12-21 MED ORDER — PROPOFOL 10 MG/ML IV BOLUS
INTRAVENOUS | Status: AC
  Filled 2021-12-21: qty 20

## 2021-12-21 MED ORDER — MIDAZOLAM HCL 5 MG/5ML IJ SOLN
INTRAMUSCULAR | Status: DC | PRN
  Administered 2021-12-21: 2 mg via INTRAVENOUS

## 2021-12-21 MED ORDER — FENTANYL CITRATE (PF) 100 MCG/2ML IJ SOLN
INTRAMUSCULAR | Status: AC
Start: 2021-12-21 — End: ?
  Filled 2021-12-21: qty 2

## 2021-12-21 MED ORDER — STERILE WATER FOR IRRIGATION IR SOLN
Status: DC | PRN
  Administered 2021-12-21: 1000 mL

## 2021-12-21 MED ORDER — PROPOFOL 10 MG/ML IV BOLUS
INTRAVENOUS | Status: DC | PRN
  Administered 2021-12-21: 150 mg via INTRAVENOUS

## 2021-12-21 MED ORDER — ONDANSETRON HCL 4 MG/2ML IJ SOLN
INTRAMUSCULAR | Status: DC | PRN
  Administered 2021-12-21: 4 mg via INTRAVENOUS

## 2021-12-21 MED ORDER — LIDOCAINE 2% (20 MG/ML) 5 ML SYRINGE
INTRAMUSCULAR | Status: DC | PRN
  Administered 2021-12-21: 80 mg via INTRAVENOUS

## 2021-12-21 MED ORDER — HYDROMORPHONE HCL 1 MG/ML IJ SOLN
0.2500 mg | INTRAMUSCULAR | Status: DC | PRN
  Administered 2021-12-21: 0.5 mg via INTRAVENOUS
  Administered 2021-12-21 (×2): 0.25 mg via INTRAVENOUS

## 2021-12-21 MED ORDER — SUGAMMADEX SODIUM 200 MG/2ML IV SOLN
INTRAVENOUS | Status: DC | PRN
  Administered 2021-12-21: 200 mg via INTRAVENOUS

## 2021-12-21 MED ORDER — KETAMINE HCL 10 MG/ML IJ SOLN
INTRAMUSCULAR | Status: DC | PRN
  Administered 2021-12-21: 25 mg via INTRAVENOUS

## 2021-12-21 MED ORDER — PROMETHAZINE HCL 25 MG/ML IJ SOLN: 6.2500 mg | INTRAMUSCULAR | Status: DC | PRN

## 2021-12-21 MED ORDER — DEXAMETHASONE SODIUM PHOSPHATE 10 MG/ML IJ SOLN
INTRAMUSCULAR | Status: AC
  Filled 2021-12-21: qty 3

## 2021-12-21 MED ORDER — ROCURONIUM BROMIDE 10 MG/ML (PF) SYRINGE
PREFILLED_SYRINGE | INTRAVENOUS | Status: AC
  Filled 2021-12-21: qty 10

## 2021-12-21 MED ORDER — ONDANSETRON HCL 4 MG/2ML IJ SOLN
INTRAMUSCULAR | Status: AC
  Filled 2021-12-21: qty 6

## 2021-12-21 MED ORDER — FENTANYL CITRATE (PF) 100 MCG/2ML IJ SOLN
INTRAMUSCULAR | Status: AC
  Filled 2021-12-21: qty 2

## 2021-12-21 MED ORDER — FENTANYL CITRATE (PF) 250 MCG/5ML IJ SOLN
INTRAMUSCULAR | Status: AC
  Filled 2021-12-21: qty 5

## 2021-12-21 MED ORDER — LIDOCAINE HCL 2 % IJ SOLN
INTRAMUSCULAR | Status: AC
  Filled 2021-12-21: qty 20

## 2021-12-21 MED ORDER — LIDOCAINE HCL (PF) 2 % IJ SOLN
INTRAMUSCULAR | Status: DC | PRN
  Administered 2021-12-21: 1.5 mg/kg/h via INTRADERMAL

## 2021-12-21 MED ORDER — PHENYLEPHRINE 80 MCG/ML (10ML) SYRINGE FOR IV PUSH (FOR BLOOD PRESSURE SUPPORT)
PREFILLED_SYRINGE | INTRAVENOUS | Status: DC | PRN
  Administered 2021-12-21: 80 ug via INTRAVENOUS

## 2021-12-21 MED ORDER — LACTATED RINGERS IR SOLN
Status: DC | PRN
  Administered 2021-12-21: 1000 mL

## 2021-12-21 MED ORDER — AMISULPRIDE (ANTIEMETIC) 5 MG/2ML IV SOLN
INTRAVENOUS | Status: AC
  Filled 2021-12-21: qty 4

## 2021-12-21 MED ORDER — STERILE WATER FOR INJECTION IJ SOLN
INTRAMUSCULAR | Status: AC
  Filled 2021-12-21: qty 10

## 2021-12-21 MED ORDER — LIDOCAINE HCL (PF) 2 % IJ SOLN
INTRAMUSCULAR | Status: AC
  Filled 2021-12-21: qty 15

## 2021-12-21 MED ORDER — AMISULPRIDE (ANTIEMETIC) 5 MG/2ML IV SOLN
10.0000 mg | Freq: Once | INTRAVENOUS | Status: AC | PRN
  Administered 2021-12-21: 10 mg via INTRAVENOUS

## 2021-12-21 MED ORDER — ROCURONIUM BROMIDE 10 MG/ML (PF) SYRINGE
PREFILLED_SYRINGE | INTRAVENOUS | Status: AC
  Filled 2021-12-21: qty 30

## 2021-12-21 MED ORDER — EPHEDRINE 5 MG/ML INJ
INTRAVENOUS | Status: AC
  Filled 2021-12-21: qty 5

## 2021-12-21 MED ORDER — SUGAMMADEX SODIUM 500 MG/5ML IV SOLN
INTRAVENOUS | Status: AC
  Filled 2021-12-21: qty 5

## 2021-12-21 MED ORDER — ORAL CARE MOUTH RINSE: 15.0000 mL | Freq: Once | OROMUCOSAL | Status: AC

## 2021-12-21 MED ORDER — HEPARIN SODIUM (PORCINE) 5000 UNIT/ML IJ SOLN
5000.0000 [IU] | INTRAMUSCULAR | Status: AC
  Administered 2021-12-21: 5000 [IU] via SUBCUTANEOUS
  Filled 2021-12-21: qty 1

## 2021-12-21 MED ORDER — MEPERIDINE HCL 50 MG/ML IJ SOLN
6.2500 mg | INTRAMUSCULAR | Status: DC | PRN
  Administered 2021-12-21: 6.25 mg via INTRAVENOUS

## 2021-12-21 MED ORDER — ACETAMINOPHEN 500 MG PO TABS
500.0000 mg | ORAL_TABLET | ORAL | Status: AC
  Administered 2021-12-21: 500 mg via ORAL
  Filled 2021-12-21: qty 1

## 2021-12-21 SURGICAL SUPPLY — 74 items
APPLICATOR SURGIFLO ENDO (HEMOSTASIS) IMPLANT
BACTOSHIELD CHG 4% 4OZ (MISCELLANEOUS) ×1
BAG LAPAROSCOPIC 12 15 PORT 16 (BASKET) IMPLANT
BAG RETRIEVAL 10 (BASKET) ×2
BAG RETRIEVAL 12/15 (BASKET)
BLADE SURG SZ10 CARB STEEL (BLADE) IMPLANT
COVER BACK TABLE 60X90IN (DRAPES) ×3 IMPLANT
COVER TIP SHEARS 8 DVNC (MISCELLANEOUS) ×2 IMPLANT
COVER TIP SHEARS 8MM DA VINCI (MISCELLANEOUS) ×1
DERMABOND ADVANCED (GAUZE/BANDAGES/DRESSINGS) ×1
DERMABOND ADVANCED .7 DNX12 (GAUZE/BANDAGES/DRESSINGS) ×2 IMPLANT
DRAPE ARM DVNC X/XI (DISPOSABLE) ×8 IMPLANT
DRAPE COLUMN DVNC XI (DISPOSABLE) ×2 IMPLANT
DRAPE DA VINCI XI ARM (DISPOSABLE) ×4
DRAPE DA VINCI XI COLUMN (DISPOSABLE) ×1
DRAPE SHEET LG 3/4 BI-LAMINATE (DRAPES) ×3 IMPLANT
DRAPE SURG IRRIG POUCH 19X23 (DRAPES) ×3 IMPLANT
DRSG OPSITE POSTOP 4X6 (GAUZE/BANDAGES/DRESSINGS) IMPLANT
DRSG OPSITE POSTOP 4X8 (GAUZE/BANDAGES/DRESSINGS) IMPLANT
ELECT PENCIL ROCKER SW 15FT (MISCELLANEOUS) IMPLANT
ELECT REM PT RETURN 15FT ADLT (MISCELLANEOUS) ×3 IMPLANT
GAUZE 4X4 16PLY ~~LOC~~+RFID DBL (SPONGE) ×3 IMPLANT
GLOVE BIO SURGEON STRL SZ 6 (GLOVE) ×12 IMPLANT
GLOVE BIO SURGEON STRL SZ 6.5 (GLOVE) ×6 IMPLANT
GOWN STRL REUS W/ TWL LRG LVL3 (GOWN DISPOSABLE) ×8 IMPLANT
GOWN STRL REUS W/TWL LRG LVL3 (GOWN DISPOSABLE) ×4
HOLDER FOLEY CATH W/STRAP (MISCELLANEOUS) IMPLANT
IRRIG SUCT STRYKERFLOW 2 WTIP (MISCELLANEOUS) ×3
IRRIGATION SUCT STRKRFLW 2 WTP (MISCELLANEOUS) ×2 IMPLANT
IV LACTATED RINGERS 1000ML (IV SOLUTION) ×2 IMPLANT
KIT PROCEDURE DA VINCI SI (MISCELLANEOUS)
KIT PROCEDURE DVNC SI (MISCELLANEOUS) IMPLANT
KIT TURNOVER KIT A (KITS) IMPLANT
LIGASURE IMPACT 36 18CM CVD LR (INSTRUMENTS) IMPLANT
MANIPULATOR ADVINCU DEL 3.0 PL (MISCELLANEOUS) IMPLANT
MANIPULATOR ADVINCU DEL 3.5 PL (MISCELLANEOUS) IMPLANT
MANIPULATOR UTERINE 4.5 ZUMI (MISCELLANEOUS) IMPLANT
NDL HYPO 21X1.5 SAFETY (NEEDLE) ×1 IMPLANT
NDL SPNL 18GX3.5 QUINCKE PK (NEEDLE) IMPLANT
NEEDLE HYPO 21X1.5 SAFETY (NEEDLE) ×3 IMPLANT
NEEDLE SPNL 18GX3.5 QUINCKE PK (NEEDLE) IMPLANT
OBTURATOR OPTICAL STANDARD 8MM (TROCAR) ×1
OBTURATOR OPTICAL STND 8 DVNC (TROCAR) ×2
OBTURATOR OPTICALSTD 8 DVNC (TROCAR) ×2 IMPLANT
PACK ROBOT GYN CUSTOM WL (TRAY / TRAY PROCEDURE) ×3 IMPLANT
PAD POSITIONING PINK XL (MISCELLANEOUS) ×3 IMPLANT
PORT ACCESS TROCAR AIRSEAL 12 (TROCAR) ×2 IMPLANT
PORT ACCESS TROCAR AIRSEAL 5M (TROCAR) ×1
SCRUB CHG 4% DYNA-HEX 4OZ (MISCELLANEOUS) ×2 IMPLANT
SEAL CANN UNIV 5-8 DVNC XI (MISCELLANEOUS) ×8 IMPLANT
SEAL XI 5MM-8MM UNIVERSAL (MISCELLANEOUS) ×4
SET TRI-LUMEN FLTR TB AIRSEAL (TUBING) ×3 IMPLANT
SPIKE FLUID TRANSFER (MISCELLANEOUS) ×3 IMPLANT
SPONGE T-LAP 18X18 ~~LOC~~+RFID (SPONGE) IMPLANT
SURGIFLO W/THROMBIN 8M KIT (HEMOSTASIS) IMPLANT
SUT MNCRL AB 4-0 PS2 18 (SUTURE) IMPLANT
SUT PDS AB 1 TP1 96 (SUTURE) IMPLANT
SUT VIC AB 0 CT1 27 (SUTURE)
SUT VIC AB 0 CT1 27XBRD ANTBC (SUTURE) IMPLANT
SUT VIC AB 2-0 CT1 27 (SUTURE)
SUT VIC AB 2-0 CT1 TAPERPNT 27 (SUTURE) IMPLANT
SUT VIC AB 4-0 PS2 18 (SUTURE) ×6 IMPLANT
SYR 10ML LL (SYRINGE) IMPLANT
SYS BAG RETRIEVAL 10MM (BASKET) ×4
SYS WOUND ALEXIS 18CM MED (MISCELLANEOUS)
SYSTEM BAG RETRIEVAL 10MM (BASKET) ×3 IMPLANT
SYSTEM WOUND ALEXIS 18CM MED (MISCELLANEOUS) IMPLANT
TOWEL OR NON WOVEN STRL DISP B (DISPOSABLE) IMPLANT
TRAP SPECIMEN MUCUS 40CC (MISCELLANEOUS) ×2 IMPLANT
TRAY FOLEY MTR SLVR 16FR STAT (SET/KITS/TRAYS/PACK) ×3 IMPLANT
TROCAR XCEL NON-BLD 5MMX100MML (ENDOMECHANICALS) IMPLANT
UNDERPAD 30X36 HEAVY ABSORB (UNDERPADS AND DIAPERS) ×4 IMPLANT
WATER STERILE IRR 1000ML POUR (IV SOLUTION) ×3 IMPLANT
YANKAUER SUCT BULB TIP 10FT TU (MISCELLANEOUS) IMPLANT

## 2021-12-21 NOTE — Discharge Instructions (Signed)

## 2021-12-21 NOTE — Interval H&P Note (Signed)
History and Physical Interval Note: ? ?12/21/2021 ?6:47 AM ? ?Jasmine Bradford  has presented today for surgery, with the diagnosis of COMPLEX ADNEXAL MASS ?FAMILY HISTORY OF OVARIAN CANCER.  The various methods of treatment have been discussed with the patient and family. After consideration of risks, benefits and other options for treatment, the patient has consented to  Procedure(s): ?XI ROBOTIC ASSISTED SALPINGO OOPHORECTOMY (Bilateral) ?XI ROBOTIC ASSISTED TOTAL HYSTERECTOMY;POSSIBLE STAGING;POSSIBLE LAPAROTOMY (N/A) as a surgical intervention.  The patient's history has been reviewed, patient examined, no change in status, stable for surgery.  I have reviewed the patient's chart and labs.  Questions were answered to the patient's satisfaction.   ? ? ?Jasmine Bradford ? ? ?

## 2021-12-21 NOTE — Op Note (Signed)
OPERATIVE NOTE ? ?Pre-operative Diagnosis: Complex adnexal mass, genetic predisposition for ovarian cancer ? ?Post-operative Diagnosis: same, benign seromucinous ovarian cyst ? ?Operation: Robotic-assisted laparoscopic bilateral salpingoophorectomy  ? ?Surgeon: Jeral Pinch MD ? ?Assistant Surgeon: Lahoma Crocker MD (an MD assistant was necessary for tissue manipulation, management of robotic instrumentation, retraction and positioning due to the complexity of the case and hospital policies).  ? ?Anesthesia: GET ? ?Urine Output: 350 cc ? ?Operative Findings: On EUA, small mobile uterus, smooth mass in the cul de sac. On intra-abdominal entry, normal diaphragm, left anterior liver adherent to the anterior abdominal wall. Omentum adherent to the anterior abdominal wall in the lower abdomen on the right. Normal omentum, small and large bowel. Uterus 6-8 cm and normal in appearance. 5cm smooth appearing cystic mass on the right ovary, second para-ovarian 1 cm cystic mass. Fallopian tube with disruption from Filsche clips. Left adnexa normal in appearance with 5 Filsche clips on the tube. NO ascites. No adenopathy. ? ?Estimated Blood Loss:  50 cc     ? ?Total IV Fluids: see I&O flowsheet ?        ?Specimens: bilateral tubes and ovaries, pelvic washings ?        ?Complications:  None apparent; patient tolerated the procedure well. ?        ?Disposition: PACU - hemodynamically stable. ? ?Procedure Details  ?The patient was seen in the Holding Room. The risks, benefits, complications, treatment options, and expected outcomes were discussed with the patient.  The patient concurred with the proposed plan, giving informed consent.  The site of surgery properly noted/marked. The patient was identified as Jasmine Bradford and the procedure verified as a Robotic-assisted bilateral salpingo-oophorectomy with any other indicated procedures.  ? ?After induction of anesthesia, the patient was draped and prepped in the usual  sterile manner. Patient was placed in supine position after anesthesia and draped and prepped in the usual sterile manner as follows: Her arms were tucked to her side with all appropriate precautions.  The shoulders were stabilized with padded shoulder blocks applied to the acromium processes.  The patient was placed in the semi-lithotomy position in Supreme.  The perineum and vagina were prepped with CholoraPrep. The patient was draped after the CholoraPrep had been allowed to dry for 3 minutes.  A Time Out was held and the above information confirmed. ? ?The urethra was prepped with Betadine. Foley catheter was placed.  A sterile speculum was placed in the vagina.  The cervix was grasped with a single-tooth tenaculum. The cervix was dilated with Kennon Rounds dilators.  A Hulka manipulator was placed.  OG tube placement was confirmed and to suction.  ? ?Next, a 10 mm skin incision was made 1 cm below the subcostal margin in the midclavicular line.  The 5 mm Optiview port and scope was used for direct entry.  Opening pressure was under 10 mm CO2.  The abdomen was insufflated and the findings were noted as above.   At this point and all points during the procedure, the patient's intra-abdominal pressure did not exceed 15 mmHg. Next, an 8 mm skin incision was made superior to the umbilicus and a right and left port were placed about 8 cm lateral to the robot port on the right and left side.  A fourth arm was placed on the right.  The 5 mm assist trocar was exchanged for a 10-12 mm port. All ports were placed under direct visualization.  The patient was placed in steep Trendelenburg.  Bowel was folded away into the upper abdomen.  The robot was docked in the normal manner. ? ?Sharp dissection and electrocautery was used to lyse filmy adhesions of the omentum to the anterior abdominal wall. Bleeding was noted from a vessel within the omentum which was cauterized. ? ?The right and left peritoneum were opened parallel to  the IP ligament to open the retroperitoneal spaces bilaterally. The round ligaments were preserved. The ureter was noted to be on the medial leaf of the broad ligament.  The peritoneum above the ureter was incised and stretched and the infundibulopelvic ligament was skeletonized, cauterized and cut.  The utero-ovarian ligament and fallopian tube were skeletonized, cauterized and transected just lateral to the uterine fundus, freeing the adnexa. Bilateral adnexa were placed in Endocatch bags. The right was placed in a bag first, decompressed in a contained manner, removed from the abdomen and sent for frozen. ? ?After the left adnexa had been removed and the frozen returned, irrigation was used and excellent hemostasis was achieved.  At this point in the procedure was completed.  Robotic instruments were removed under direct visulaization.  The robot was undocked. The fascia at the 10-12 mm port was closed with 0 Vicryl on a UR-5 needle.  The subcuticular tissue was closed with 4-0 Vicryl and the skin was closed with 4-0 Monocryl in a subcuticular manner.  Dermabond was applied.   ? ?The vagina was swabbed with  minimal bleeding noted. Foley catheter was removed.  All sponge, lap and needle counts were correct x  3.  ? ?The patient was transferred to the recovery room in stable condition. ? ?Jeral Pinch, MD ? ?

## 2021-12-21 NOTE — Anesthesia Postprocedure Evaluation (Signed)
Anesthesia Post Note ? ?Patient: Jasmine Bradford ? ?Procedure(s) Performed: XI ROBOTIC ASSISTED SALPINGO OOPHORECTOMY (Bilateral: Abdomen) ? ?  ? ?Patient location during evaluation: PACU ?Anesthesia Type: General ?Level of consciousness: awake and alert ?Pain management: pain level controlled ?Vital Signs Assessment: post-procedure vital signs reviewed and stable ?Respiratory status: spontaneous breathing, nonlabored ventilation and respiratory function stable ?Cardiovascular status: blood pressure returned to baseline and stable ?Postop Assessment: no apparent nausea or vomiting ?Anesthetic complications: no ? ? ?No notable events documented. ? ?Last Vitals:  ?Vitals:  ? 12/21/21 1445 12/21/21 1500  ?BP: 127/62 138/73  ?Pulse: 60 75  ?Resp: (!) 7 18  ?Temp:  (!) 36.4 ?C  ?SpO2: 100% 100%  ?  ?Last Pain:  ?Vitals:  ? 12/21/21 1500  ?TempSrc: Oral  ?PainSc: 5   ? ? ?  ?  ?  ?  ?  ?  ? ?Lynda Rainwater ? ? ? ? ?

## 2021-12-21 NOTE — Progress Notes (Signed)
RLE venous duplex has been completed.  Preliminary results given to RN. ? ? ?Results can be found under chart review under CV PROC. ?12/21/2021 8:53 AM ?Chynna Buerkle RVT, RDMS ? ?

## 2021-12-21 NOTE — Transfer of Care (Signed)
Immediate Anesthesia Transfer of Care Note ? ?Patient: Jasmine Bradford ? ?Procedure(s) Performed: XI ROBOTIC ASSISTED SALPINGO OOPHORECTOMY (Bilateral: Abdomen) ? ?Patient Location: PACU ? ?Anesthesia Type:General ? ?Level of Consciousness: drowsy and patient cooperative ? ?Airway & Oxygen Therapy: Patient Spontanous Breathing and Patient connected to face mask oxygen ? ?Post-op Assessment: Report given to RN and Post -op Vital signs reviewed and stable ? ?Post vital signs: Reviewed and stable ? ?Last Vitals:  ?Vitals Value Taken Time  ?BP 147/77 12/21/21 1346  ?Temp 36.4 ?C 12/21/21 1345  ?Pulse 77 12/21/21 1350  ?Resp 16 12/21/21 1350  ?SpO2 100 % 12/21/21 1350  ?Vitals shown include unvalidated device data. ? ?Last Pain:  ?Vitals:  ? 12/21/21 1345  ?TempSrc:   ?PainSc: Asleep  ?   ? ?  ? ?Complications: No notable events documented. ?

## 2021-12-21 NOTE — Progress Notes (Signed)
Upon seeing the patient this morning, she notes after some increased activity and heavy work, she has had knee swelling and calf pain. Mild knee swelling of the right knee noted on exam today. Pain with calf palpation. Given concern that symptoms could represent DVT, will plan to get STAT duplex prior to proceeding with surgery.  ? ?Jeral Pinch MD ?Gynecologic Oncology ? ?

## 2021-12-21 NOTE — Anesthesia Procedure Notes (Signed)
Procedure Name: Intubation ?Date/Time: 12/21/2021 11:59 AM ?Performed by: West Pugh, CRNA ?Pre-anesthesia Checklist: Patient identified, Emergency Drugs available, Suction available, Patient being monitored and Timeout performed ?Patient Re-evaluated:Patient Re-evaluated prior to induction ?Oxygen Delivery Method: Circle system utilized ?Preoxygenation: Pre-oxygenation with 100% oxygen ?Induction Type: IV induction ?Ventilation: Mask ventilation without difficulty ?Laryngoscope Size: Mac and 3 ?Grade View: Grade I ?Tube type: Oral ?Tube size: 7.0 mm ?Number of attempts: 1 ?Airway Equipment and Method: Stylet ?Placement Confirmation: ETT inserted through vocal cords under direct vision, positive ETCO2, CO2 detector and breath sounds checked- equal and bilateral ?Secured at: 21 cm ?Tube secured with: Tape ?Dental Injury: Teeth and Oropharynx as per pre-operative assessment  ?Comments: Multiple teeth cracked with sharp edges during preop assessment. ? ? ? ? ?

## 2021-12-21 NOTE — Anesthesia Preprocedure Evaluation (Addendum)
Anesthesia Evaluation  ?Patient identified by MRN, date of birth, ID band ?Patient awake ? ? ? ?Reviewed: ?Allergy & Precautions, NPO status , Patient's Chart, lab work & pertinent test results ? ?Airway ?Mallampati: II ? ?TM Distance: >3 FB ?Neck ROM: Full ? ? ? Dental ?no notable dental hx. ? ?  ?Pulmonary ?neg pulmonary ROS, former smoker,  ?  ?Pulmonary exam normal ?breath sounds clear to auscultation ? ? ? ? ? ? Cardiovascular ?negative cardio ROS ?Normal cardiovascular exam ?Rhythm:Regular Rate:Normal ? ? ?  ?Neuro/Psych ?negative neurological ROS ? negative psych ROS  ? GI/Hepatic ?Neg liver ROS, GERD  ,  ?Endo/Other  ?negative endocrine ROS ? Renal/GU ?negative Renal ROS  ?negative genitourinary ?  ?Musculoskeletal ?negative musculoskeletal ROS ?(+)  ? Abdominal ?  ?Peds ?negative pediatric ROS ?(+)  Hematology ?negative hematology ROS ?(+)   ?Anesthesia Other Findings ? ? Reproductive/Obstetrics ?negative OB ROS ? ?  ? ? ? ? ? ? ? ? ? ? ? ? ? ?  ?  ? ? ? ? ? ? ? ? ?Anesthesia Physical ?Anesthesia Plan ? ?ASA: 2 ? ?Anesthesia Plan: General  ? ?Post-op Pain Management: Tylenol PO (pre-op)*, Dilaudid IV, Lidocaine infusion* and Ketamine IV*  ? ?Induction: Intravenous ? ?PONV Risk Score and Plan: 3 and Ondansetron, Dexamethasone, Midazolam and Treatment may vary due to age or medical condition ? ?Airway Management Planned: Oral ETT ? ?Additional Equipment:  ? ?Intra-op Plan:  ? ?Post-operative Plan: Extubation in OR ? ?Informed Consent: I have reviewed the patients History and Physical, chart, labs and discussed the procedure including the risks, benefits and alternatives for the proposed anesthesia with the patient or authorized representative who has indicated his/her understanding and acceptance.  ? ? ? ?Dental advisory given ? ?Plan Discussed with: CRNA ? ?Anesthesia Plan Comments:   ? ? ? ? ? ?Anesthesia Quick Evaluation ? ?

## 2021-12-22 ENCOUNTER — Telehealth: Payer: Self-pay

## 2021-12-22 ENCOUNTER — Encounter (HOSPITAL_COMMUNITY): Payer: Self-pay | Admitting: Gynecologic Oncology

## 2021-12-22 NOTE — Telephone Encounter (Signed)
Spoke with Ms. Halteman this morning. She reports having nausea and vomiting after leaving the hospital yesterday. She had zofran at home and took one, symptoms have resolved. She states she is eating and drinking well. She endorses some burning with urination. Advised to increase her fluid intake and monitor burning. If burning persists or she develops any other urinary symptoms to contact the office. She has not had a BM yet but is passing gas. She forgot to take her senokot last night but will take it this evening. Instructed to drink at least 64 ounces of water throughout the day. She denies fever or chills. Incisions are dry and intact. She rates her pain 0/10 when resting and 5/10 when walking. Her pain is controlled with tylenol. Advised to alternate taking the tylenol and ibuprofen. She has tramadol if needed.    ? ?Instructed to call office with any fever, chills, purulent drainage, uncontrolled pain or any other questions or concerns. Patient verbalizes understanding.  ? ?Pt aware of post op appointments as well as the office number [phone] and after hours number [phone] to call if she has any questions or concerns  ?

## 2021-12-23 LAB — CYTOLOGY - NON PAP

## 2021-12-23 LAB — SURGICAL PATHOLOGY

## 2021-12-24 DIAGNOSIS — M79661 Pain in right lower leg: Secondary | ICD-10-CM | POA: Diagnosis not present

## 2021-12-28 ENCOUNTER — Encounter: Payer: Self-pay | Admitting: Gynecologic Oncology

## 2021-12-28 ENCOUNTER — Inpatient Hospital Stay: Payer: Medicare HMO | Attending: Gynecologic Oncology | Admitting: Gynecologic Oncology

## 2021-12-28 ENCOUNTER — Other Ambulatory Visit (INDEPENDENT_AMBULATORY_CARE_PROVIDER_SITE_OTHER): Payer: Medicare HMO

## 2021-12-28 DIAGNOSIS — Z8041 Family history of malignant neoplasm of ovary: Secondary | ICD-10-CM

## 2021-12-28 DIAGNOSIS — K8050 Calculus of bile duct without cholangitis or cholecystitis without obstruction: Secondary | ICD-10-CM | POA: Diagnosis not present

## 2021-12-28 DIAGNOSIS — Z90722 Acquired absence of ovaries, bilateral: Secondary | ICD-10-CM

## 2021-12-28 DIAGNOSIS — D270 Benign neoplasm of right ovary: Secondary | ICD-10-CM

## 2021-12-28 DIAGNOSIS — N839 Noninflammatory disorder of ovary, fallopian tube and broad ligament, unspecified: Secondary | ICD-10-CM

## 2021-12-28 LAB — HEPATIC FUNCTION PANEL
ALT: 19 U/L (ref 0–35)
AST: 17 U/L (ref 0–37)
Albumin: 4.3 g/dL (ref 3.5–5.2)
Alkaline Phosphatase: 65 U/L (ref 39–117)
Bilirubin, Direct: 0.1 mg/dL (ref 0.0–0.3)
Total Bilirubin: 0.5 mg/dL (ref 0.2–1.2)
Total Protein: 7.3 g/dL (ref 6.0–8.3)

## 2021-12-28 NOTE — Progress Notes (Signed)
Gynecologic Oncology Telehealth Consult Note: Gyn-Onc ? ?I connected with Jasmine Bradford on 12/28/21 at  4:00 PM EDT by telephone and verified that I am speaking with the correct person using two identifiers. ? ?I discussed the limitations, risks, security and privacy concerns of performing an evaluation and management service by telemedicine and the availability of in-person appointments. I also discussed with the patient that there may be a patient responsible charge related to this service. The patient expressed understanding and agreed to proceed. ? ?Other persons participating in the visit and their role in the encounter: none. ? ?Patient's location: home  ?Provider's location: [facility] ? ?Reason for Visit: follow-up post-op ? ?Treatment History: ?12/21/21: Robotic BSO for complex adnexal mass, increased risk of ovarian cancer ? ?Interval History: ?Doing well.  ?Only using Tylenol prn for pain. Just sore. ?Bowel and bladder function normal. ?Appetite normal, no nausea or emesis. ?Little bruising, improving. Incisions healing well.  ? ?Past Medical/Surgical History: ?Past Medical History:  ?Diagnosis Date  ? Allergy   ? Cholelithiasis with choledocholithiasis   ? Diverticulitis   ? GERD (gastroesophageal reflux disease)   ? Tobacco abuse   ? Varicose vein of leg   ? no problems now  ? Wears glasses   ? ? ?Past Surgical History:  ?Procedure Laterality Date  ? BILIARY BRUSHING  12/09/2021  ? Procedure: BILIARY BRUSHING;  Surgeon: Rush Landmark Telford Nab., MD;  Location: [place];  Service: Gastroenterology;;  ? BILIARY DILATION  02/19/2020  ? Procedure: BILIARY DILATION;  Surgeon: Jackquline Denmark, MD;  Location: [facility] ENDOSCOPY;  Service: Endoscopy;;  ? BILIARY DILATION  10/15/2021  ? Procedure: BILIARY DILATION;  Surgeon: Jackquline Denmark, MD;  Location: [place];  Service: Gastroenterology;;  ? BILIARY DILATION  12/09/2021  ? Procedure: BILIARY DILATION;  Surgeon: Rush Landmark Telford Nab., MD;  Location: [place];  Service: Gastroenterology;;  ? BILIARY STENT PLACEMENT  10/15/2021  ? Procedure: BILIARY STENT PLACEMENT;  Surgeon: Jackquline Denmark, MD;  Location: [place];  Service: Gastroenterology;;  ? CESAREAN SECTION    ? CHOLECYSTECTOMY N/A 05/23/2016  ? Procedure: LAPAROSCOPIC CHOLECYSTECTOMY WITH INTRAOPERATIVE CHOLANGIOGRAM;  Surgeon: Jackolyn Confer, MD;  Location: [place];  Service: General;  Laterality: N/A;  ? ENDOSCOPIC RETROGRADE CHOLANGIOPANCREATOGRAPHY (ERCP) WITH PROPOFOL N/A 06/06/2016  ? Procedure: ENDOSCOPIC RETROGRADE CHOLANGIOPANCREATOGRAPHY (ERCP) WITH PROPOFOL;  Surgeon: Clarene Essex, MD;  Location: [facility] ENDOSCOPY;  Service: Endoscopy;  Laterality: N/A;  ? ENDOSCOPIC RETROGRADE CHOLANGIOPANCREATOGRAPHY (ERCP) WITH PROPOFOL N/A 02/19/2020  ? Procedure: ENDOSCOPIC RETROGRADE CHOLANGIOPANCREATOGRAPHY (ERCP) WITH PROPOFOL;  Surgeon: Jackquline Denmark, MD;  Location: [facility] ENDOSCOPY;  Service: Endoscopy;  Laterality: N/A;  ? ENDOSCOPIC RETROGRADE CHOLANGIOPANCREATOGRAPHY (ERCP) WITH PROPOFOL N/A 10/15/2021  ? Procedure: ENDOSCOPIC RETROGRADE CHOLANGIOPANCREATOGRAPHY (ERCP) WITH PROPOFOL;  Surgeon: Jackquline Denmark, MD;  Location: [place];  Service: Gastroenterology;  Laterality: N/A;  ? ENDOSCOPIC RETROGRADE CHOLANGIOPANCREATOGRAPHY (ERCP) WITH PROPOFOL N/A 12/09/2021  ? Procedure: ENDOSCOPIC RETROGRADE CHOLANGIOPANCREATOGRAPHY (ERCP) WITH PROPOFOL;  Surgeon: Rush Landmark Telford Nab., MD;  Location: [place];  Service: Gastroenterology;  Laterality: N/A;  ? ERCP N/A 03/09/2016  ? Procedure: ENDOSCOPIC RETROGRADE CHOLANGIOPANCREATOGRAPHY (ERCP);  Surgeon: Clarene Essex, MD;  Location: [facility];  Service: Endoscopy;  Laterality: N/A;  ? ERCP N/A 06/12/2018  ? Procedure: ENDOSCOPIC RETROGRADE CHOLANGIOPANCREATOGRAPHY (ERCP);  Surgeon: Clarene Essex, MD;  Location: [facility] ENDOSCOPY;  Service: Endoscopy;  Laterality: N/A;  ? ESOPHAGOGASTRODUODENOSCOPY N/A 06/12/2018  ? Procedure: ESOPHAGOGASTRODUODENOSCOPY (EGD);   Surgeon: Clarene Essex, MD;  Location: [facility] ENDOSCOPY;  Service: Endoscopy;  Laterality: N/A;  ? laser vein surgery    ?  last year  ? LITHOTRIPSY  12/09/2021  ? Procedure: LITHOTRIPSY;  Surgeon: Mansouraty, Telford Nab., MD;  Location: [place];  Service: Gastroenterology;;  ? REMOVAL OF STONES  06/12/2018  ? Procedure: REMOVAL OF STONES;  Surgeon: Clarene Essex, MD;  Location: [facility] ENDOSCOPY;  Service: Endoscopy;;  ? REMOVAL OF STONES  02/19/2020  ? Procedure: REMOVAL OF STONES;  Surgeon: Jackquline Denmark, MD;  Location: [place];  Service: Endoscopy;;  ? REMOVAL OF STONES  10/15/2021  ? Procedure: REMOVAL OF STONES;  Surgeon: Jackquline Denmark, MD;  Location: [place];  Service: Gastroenterology;;  ? REMOVAL OF STONES  12/09/2021  ? Procedure: REMOVAL OF STONES;  Surgeon: Mansouraty, Telford Nab., MD;  Location: [place];  Service: Gastroenterology;;  ? ROBOTIC ASSISTED SALPINGO OOPHERECTOMY Bilateral 12/21/2021  ? Procedure: XI ROBOTIC ASSISTED SALPINGO OOPHORECTOMY;  Surgeon: [name], MD;  Location: [facility];  Service: Gynecology;  Laterality: Bilateral;  ? SPHINCTEROTOMY  06/12/2018  ? Procedure: SPHINCTEROTOMY;  Surgeon: Clarene Essex, MD;  Location: [facility] ENDOSCOPY;  Service: Endoscopy;;  ? SPHINCTEROTOMY  02/19/2020  ? Procedure: SPHINCTEROTOMY;  Surgeon: Jackquline Denmark, MD;  Location: [facility] ENDOSCOPY;  Service: Endoscopy;;  ? SPYGLASS CHOLANGIOSCOPY N/A 10/15/2021  ? Procedure: SPYGLASS CHOLANGIOSCOPY;  Surgeon: Jackquline Denmark, MD;  Location: [facility] ENDOSCOPY;  Service: Gastroenterology;  Laterality: N/A;  ? SPYGLASS CHOLANGIOSCOPY N/A 12/09/2021  ? Procedure: SPYGLASS CHOLANGIOSCOPY;  Surgeon: Irving Copas., MD;  Location: [place];  Service: Gastroenterology;  Laterality: N/A;  ? SPYGLASS LITHOTRIPSY N/A 10/15/2021  ? Procedure: SPYGLASS LITHOTRIPSY;  Surgeon: Jackquline Denmark, MD;  Location[facility] ENDOSCOPY;  Service: Gastroenterology;  Laterality: N/A;  ? STENT REMOVAL  12/09/2021  ? Procedure: STENT  REMOVAL;  Surgeon: Irving Copas., MD;  Location: [place];  Service: Gastroenterology;;  ? TUBAL LIGATION    ? at time of c-section  ? VARICOSE VEIN SURGERY Left   ? ? ?Family History  ?Problem Relation Age of Onset  ? Memory loss Mother   ? Ovarian cancer Mother   ? Cancer Mother   ? Colon cancer Neg Hx   ? Breast cancer Neg Hx   ? Endometrial cancer Neg Hx   ? Pancreatic cancer Neg Hx   ? Prostate cancer Neg Hx   ? ? ?Social History  ? ?Socioeconomic History  ? Marital status: Married  ?  Spouse name: Nathaneil Canary  ? Number of children: 4  ? Years of education: Not on file  ? Highest education level: Not on file  ?Occupational History  ? Not on file  ?Tobacco Use  ? Smoking status: Former  ?  Packs/day: 0.50  ?  Years: 35.00  ?  Pack years: 17.50  ?  Types: Cigarettes  ? Smokeless tobacco: Never  ? Tobacco comments:  ?  No smoking since 02/2016  ?Vaping Use  ? Vaping Use: Never used  ?Substance and Sexual Activity  ? Alcohol use: No  ? Drug use: Never  ? Sexual activity: Not Currently  ?  Partners: Male  ?  Birth control/protection: Post-menopausal, Surgical  ?  Comment: BTL  ?Other Topics Concern  ? Not on file  ?Social History Narrative  ? Not on file  ? ?Social Determinants of Health  ? ?Financial Resource Strain: Low Risk   ? Difficulty of Paying Living Expenses: Not hard at all  ?Food Insecurity: No Food Insecurity  ? Worried About Charity fundraiser in the Last Year: Never true  ? Ran Out of Food in the Last Year:  Never true  ?Transportation Needs: No Transportation Needs  ? Lack of Transportation (Medical): No  ? Lack of Transportation (Non-Medical): No  ?Physical Activity: Sufficiently Active  ? Days of Exercise per Week: 7 days  ? Minutes of Exercise per Session: 30 min  ?Stress: No Stress Concern Present  ? Feeling of Stress : Only a little  ?Social Connections: Moderately Isolated  ? Frequency of Communication with Friends and Family: More than three times a week  ? Frequency of Social  Gatherings with Friends and Family: Twice a week  ? Attends Religious Services: Never  ? Active Member of Clubs or Organizations: No  ? Attends Archivist Meetings: Never  ? Marital Status: Married  ? ? ?Current

## 2021-12-30 ENCOUNTER — Telehealth: Payer: Self-pay | Admitting: *Deleted

## 2021-12-30 ENCOUNTER — Encounter: Payer: Self-pay | Admitting: Gynecologic Oncology

## 2021-12-30 NOTE — Telephone Encounter (Signed)
Spoke with pt this afternoon who stated that she notice the top left suture site looks a little swollen and sore. She denies any drainage, redness, fever or chills. Informed her that per Joylene John, NP pt may be noticing the suture knot at the incision site. Pt can send in a picture through Mychart for a better assessment. She stated she will send [place] a picture.  ?

## 2021-12-31 NOTE — Telephone Encounter (Signed)
Spoke with pt this morning to inform her that Joylene John, NP was able to see the picture of her incision she sent on my chart and stated it looks normal. She can apply ice intermittently and keep monitoring it. Pt verbalized understanding. ?

## 2022-01-03 ENCOUNTER — Inpatient Hospital Stay: Payer: Medicare HMO | Admitting: Licensed Clinical Social Worker

## 2022-01-03 ENCOUNTER — Inpatient Hospital Stay: Payer: Medicare HMO

## 2022-01-03 ENCOUNTER — Encounter: Payer: Self-pay | Admitting: Licensed Clinical Social Worker

## 2022-01-03 ENCOUNTER — Other Ambulatory Visit: Payer: Self-pay

## 2022-01-03 ENCOUNTER — Other Ambulatory Visit: Payer: Self-pay | Admitting: Licensed Clinical Social Worker

## 2022-01-03 DIAGNOSIS — Z8041 Family history of malignant neoplasm of ovary: Secondary | ICD-10-CM

## 2022-01-03 NOTE — Progress Notes (Signed)
REFERRING PROVIDER: ?Dorothyann Gibbs, NP ?[address] ?[place],  [place] [address] ? ?PRIMARY PROVIDER:  ?Flossie Buffy, NP ? ?PRIMARY REASON FOR VISIT:  ?1. Family history of ovarian cancer   ? ? ? ?HISTORY OF PRESENT ILLNESS:   ?Ms. Guardia, a 66 y.o. female, was seen for a [place] cancer genetics consultation at the request of Dr. Elinor Parkinson due to a family history of ovarian cancer.  Ms. Louthan presents to clinic today to discuss the possibility of a hereditary predisposition to cancer, genetic testing, and to further clarify her future cancer risks, as well as potential cancer risks for family members.  ? ?CANCER HISTORY:  ?Ms. Bergemann is a 66 y.o. female with no personal history of cancer.   ? ?RISK FACTORS:  ?Menarche was at age unknown.  ?First live birth at age 31.  ?OCP use for approximately 0 years.  ?Ovaries intact: no.  ?Hysterectomy: no. ?Menopausal status: postmenopausal.  ?HRT use: 0 years. ?Colonoscopy: yes;  colonoscopy every 5 years . ?Mammogram within the last year: yes. ?Number of breast biopsies: 0. ?Up to date with pelvic exams: yes. ? ?Past Medical History:  ?Diagnosis Date  ? Allergy   ? Cholelithiasis with choledocholithiasis   ? Diverticulitis   ? GERD (gastroesophageal reflux disease)   ? Tobacco abuse   ? Varicose vein of leg   ? no problems now  ? Wears glasses   ? ? ?Past Surgical History:  ?Procedure Laterality Date  ? BILIARY BRUSHING  12/09/2021  ? Procedure: BILIARY BRUSHING;  Surgeon: Rush Landmark Telford Nab., MD;  Location: [place];  Service: Gastroenterology;;  ? BILIARY DILATION  02/19/2020  ? Procedure: BILIARY DILATION;  Surgeon: Jackquline Denmark, MD;  Location: [facility]COPY;  Service: Endoscopy;;  ? BILIARY DILATION  10/15/2021  ? Procedure: BILIARY DILATION;  Surgeon: Jackquline Denmark, MD;  Location: [place];  Service: Gastroenterology;;  ? BILIARY DILATION  12/09/2021  ? Procedure: BILIARY DILATION;  Surgeon: Rush Landmark Telford Nab., MD;  Location: [place];   Service: Gastroenterology;;  ? BILIARY STENT PLACEMENT  10/15/2021  ? Procedure: BILIARY STENT PLACEMENT;  Surgeon: Jackquline Denmark, MD;  Location: [place];  Service: Gastroenterology;;  ? CESAREAN SECTION    ? CHOLECYSTECTOMY N/A 05/23/2016  ? Procedure: LAPAROSCOPIC CHOLECYSTECTOMY WITH INTRAOPERATIVE CHOLANGIOGRAM;  Surgeon: Jackolyn Confer, MD;  Location: [place];  Service: General;  Laterality: N/A;  ? ENDOSCOPIC RETROGRADE CHOLANGIOPANCREATOGRAPHY (ERCP) WITH PROPOFOL N/A 06/06/2016  ? Procedure: ENDOSCOPIC RETROGRADE CHOLANGIOPANCREATOGRAPHY (ERCP) WITH PROPOFOL;  Surgeon: Clarene Essex, MD;  Location: [facility]OSCOPY;  Service: Endoscopy;  Laterality: N/A;  ? ENDOSCOPIC RETROGRADE CHOLANGIOPANCREATOGRAPHY (ERCP) WITH PROPOFOL N/A 02/19/2020  ? Procedure: ENDOSCOPIC RETROGRADE CHOLANGIOPANCREATOGRAPHY (ERCP) WITH PROPOFOL;  Surgeon: Jackquline Denmark, MD;  Location: [facility] ENDOSCOPY;  Service: Endoscopy;  Laterality: N/A;  ? ENDOSCOPIC RETROGRADE CHOLANGIOPANCREATOGRAPHY (ERCP) WITH PROPOFOL N/A 10/15/2021  ? Procedure: ENDOSCOPIC RETROGRADE CHOLANGIOPANCREATOGRAPHY (ERCP) WITH PROPOFOL;  Surgeon: Jackquline Denmark, MD;  Location: [place];  Service: Gastroenterology;  Laterality: N/A;  ? ENDOSCOPIC RETROGRADE CHOLANGIOPANCREATOGRAPHY (ERCP) WITH PROPOFOL N/A 12/09/2021  ? Procedure: ENDOSCOPIC RETROGRADE CHOLANGIOPANCREATOGRAPHY (ERCP) WITH PROPOFOL;  Surgeon: Rush Landmark Telford Nab., MD;  Location: [place];  Service: Gastroenterology;  Laterality: N/A;  ? ERCP N/A 03/09/2016  ? Procedure: ENDOSCOPIC RETROGRADE CHOLANGIOPANCREATOGRAPHY (ERCP);  Surgeon: Clarene Essex, MD;  Location: [facility];  Service: Endoscopy;  Laterality: N/A;  ? ERCP N/A 06/12/2018  ? Procedure: ENDOSCOPIC RETROGRADE CHOLANGIOPANCREATOGRAPHY (ERCP);  Surgeon: Clarene Essex, MD;  Location: [facility];  Service: Endoscopy;  Laterality: N/A;  ?  ESOPHAGOGASTRODUODENOSCOPY N/A 06/12/2018  ? Procedure: ESOPHAGOGASTRODUODENOSCOPY (EGD);  Surgeon:  Clarene Essex, MD;  Location: [facility];  Service: Endoscopy;  Laterality: N/A;  ? laser vein surgery    ? last year  ? LITHOTRIPSY  12/09/2021  ? Procedure: LITHOTRIPSY;  Surgeon: Mansouraty, Telford Nab., MD;  Location: [place];  Service: Gastroenterology;;  ? REMOVAL OF STONES  06/12/2018  ? Procedure: REMOVAL OF STONES;  Surgeon: Clarene Essex, MD;  Location: [facility] ENDOSCOPY;  Service: Endoscopy;;  ? REMOVAL OF STONES  02/19/2020  ? Procedure: REMOVAL OF STONES;  Surgeon: Jackquline Denmark, MD;  Location: [place];  Service: Endoscopy;;  ? REMOVAL OF STONES  10/15/2021  ? Procedure: REMOVAL OF STONES;  Surgeon: Jackquline Denmark, MD;  Location: [place];  Service: Gastroenterology;;  ? REMOVAL OF STONES  12/09/2021  ? Procedure: REMOVAL OF STONES;  Surgeon: Mansouraty, Telford Nab., MD;  Location: [place];  Service: Gastroenterology;;  ? ROBOTIC ASSISTED SALPINGO OOPHERECTOMY Bilateral 12/21/2021  ? Procedure: XI ROBOTIC ASSISTED SALPINGO OOPHORECTOMY;  Surgeon: [name], MD;  Location: [facility];  Service: Gynecology;  Laterality: Bilateral;  ? SPHINCTEROTOMY  06/12/2018  ? Procedure: SPHINCTEROTOMY;  Surgeon: Clarene Essex, MD;  Location: [facility];  Service: Endoscopy;;  ? SPHINCTEROTOMY  02/19/2020  ? Procedure: SPHINCTEROTOMY;  Surgeon: Jackquline Denmark, MD;  Location: [facility];  Service: Endoscopy;;  ? SPYGLASS CHOLANGIOSCOPY N/A 10/15/2021  ? Procedure: SPYGLASS CHOLANGIOSCOPY;  Surgeon: Jackquline Denmark, MD;  Location: [facility] ENDOSCOPY;  Service: Gastroenterology;  Laterality: N/A;  ? SPYGLASS CHOLANGIOSCOPY N/A 12/09/2021  ? Procedure: SPYGLASS CHOLANGIOSCOPY;  Surgeon: Irving Copas., MD;  Location: [place];  Service: Gastroenterology;  Laterality: N/A;  ? SPYGLASS LITHOTRIPSY N/A 10/15/2021  ? Procedure: SPYGLASS LITHOTRIPSY;  Surgeon: Jackquline Denmark, MD;  Location: [facility] ENDOSCOPY;  Service: Gastroenterology;  Laterality: N/A;  ? STENT REMOVAL  12/09/2021  ? Procedure: STENT REMOVAL;   Surgeon: Irving Copas., MD;  Location: [place];  Service: Gastroenterology;;  ? TUBAL LIGATION    ? at time of c-section  ? VARICOSE VEIN SURGERY Left   ? ?  ? ?FAMILY HISTORY:  ?We obtained a detailed, 4-generation family history.  Significant diagnoses are listed below: ?Family History  ?Problem Relation Age of Onset  ? Memory loss Mother   ? Ovarian cancer Mother   ? Cancer Mother   ? Colon cancer Neg Hx   ? Breast cancer Neg Hx   ? Endometrial cancer Neg Hx   ? Pancreatic cancer Neg Hx   ? Prostate cancer Neg Hx   ? ?Ms. Hoopingarner has 2 sons, 2 daughters, 1 full sister and 1 maternal half brother. ? ?Ms. Mcmeen's mother had ovarian cancer in her 65s and passed at 56. Patient has 2 maternal aunts, no cancers. Maternal grandparents passed over age 72. ? ?Ms. Skoczylas has limited information about her paternal relatives and is unaware of cancers on this side of the family. ? ?Ms. Gretzinger is unaware of previous family history of genetic testing for hereditary cancer risks.  There is no reported Ashkenazi Jewish ancestry. There is no known consanguinity. ? ? ? ?GENETIC COUNSELING ASSESSMENT: Ms. Orellana is a 66 y.o. female with a family history of ovarian cancer which is somewhat suggestive of a hereditary cancer syndrome and predisposition to cancer. We, therefore, discussed and recommended the following at today's visit.  ? ?DISCUSSION: We discussed that approximately 10-20% of ovarian cancer is hereditary. Most cases of hereditary ovarian cancer are associated with BRCA1/BRCA2 genes, although there are  other genes associated with hereditary cancer as well. Cancers and risks are gene specific. We discussed that testing is beneficial for several reasons including knowing about cancer risks, identifying potential screening and risk-reduction options that may be appropriate, and to understand if other family members could be at risk for cancer and allow them to undergo genetic testing.  ? ?We  reviewed the characteristics, features and inheritance patterns of hereditary cancer syndromes. We also discussed genetic testing, including the appropriate family members to test, the process of testing, insurance c

## 2022-01-12 DIAGNOSIS — M79661 Pain in right lower leg: Secondary | ICD-10-CM | POA: Diagnosis not present

## 2022-01-13 ENCOUNTER — Encounter: Payer: Self-pay | Admitting: Gynecologic Oncology

## 2022-01-14 ENCOUNTER — Encounter: Payer: Medicare HMO | Admitting: Gynecologic Oncology

## 2022-01-20 ENCOUNTER — Encounter: Payer: Self-pay | Admitting: Gynecologic Oncology

## 2022-01-20 ENCOUNTER — Other Ambulatory Visit: Payer: Self-pay

## 2022-01-20 ENCOUNTER — Inpatient Hospital Stay: Payer: Medicare HMO | Attending: Gynecologic Oncology | Admitting: Gynecologic Oncology

## 2022-01-20 VITALS — BP 113/77 | HR 87 | Temp 98.3°F | Resp 16 | Ht 61.0 in | Wt 120.5 lb

## 2022-01-20 DIAGNOSIS — N839 Noninflammatory disorder of ovary, fallopian tube and broad ligament, unspecified: Secondary | ICD-10-CM

## 2022-01-20 DIAGNOSIS — Z90722 Acquired absence of ovaries, bilateral: Secondary | ICD-10-CM

## 2022-01-20 NOTE — Progress Notes (Signed)
Gynecologic Oncology Return Clinic Visit  01/20/2022  Reason for Visit: Follow-up after surgery, review pathology  Treatment History: 12/21/21: Robotic BSO for complex adnexal mass, increased risk of ovarian cancer  Interval History: Doing well.  Denies any significant abdominal pain.  Had some swelling at the left upper incision which has improved.  Reports regular bowel and bladder function.  Her husband, who has been battling cancer for the last couple of years, died last week.  Past Medical/Surgical History: Past Medical History:  Diagnosis Date   Allergy    Cholelithiasis with choledocholithiasis    Diverticulitis    GERD (gastroesophageal reflux disease)    Tobacco abuse    Varicose vein of leg    no problems now   Wears glasses     Past Surgical History:  Procedure Laterality Date   BILIARY BRUSHING  12/09/2021   Procedure: BILIARY BRUSHING;  Surgeon: Irving Copas., MD;  Location: [place];  Service: Gastroenterology;;   BILIARY DILATION  02/19/2020   Procedure: BILIARY DILATION;  Surgeon: Jackquline Denmark, MD;  Location: [facility] ENDOSCOPY;  Service: Endoscopy;;   BILIARY DILATION  10/15/2021   Procedure: BILIARY DILATION;  Surgeon: Jackquline Denmark, MD;  Location: [place];  Service: Gastroenterology;;   BILIARY DILATION  12/09/2021   Procedure: BILIARY DILATION;  Surgeon: Irving Copas., MD;  Location: [facility] ENDOSCOPY;  Service: Gastroenterology;;   BILIARY STENT PLACEMENT  10/15/2021   Procedure: BILIARY STENT PLACEMENT;  Surgeon: Jackquline Denmark, MD;  Location: [facility] ENDOSCOPY;  Service: Gastroenterology;;   CESAREAN SECTION     CHOLECYSTECTOMY N/A 05/23/2016   Procedure: LAPAROSCOPIC CHOLECYSTECTOMY WITH INTRAOPERATIVE CHOLANGIOGRAM;  Surgeon: Jackolyn Confer, MD;  Location: [place];  Service: General;  Laterality: N/A;   ENDOSCOPIC RETROGRADE CHOLANGIOPANCREATOGRAPHY (ERCP) WITH PROPOFOL N/A 06/06/2016   Procedure: ENDOSCOPIC RETROGRADE CHOLANGIOPANCREATOGRAPHY  (ERCP) WITH PROPOFOL;  Surgeon: Clarene Essex, MD;  Location: [facility] ENDOSCOPY;  Service: Endoscopy;  Laterality: N/A;   ENDOSCOPIC RETROGRADE CHOLANGIOPANCREATOGRAPHY (ERCP) WITH PROPOFOL N/A 02/19/2020   Procedure: ENDOSCOPIC RETROGRADE CHOLANGIOPANCREATOGRAPHY (ERCP) WITH PROPOFOL;  Surgeon: Jackquline Denmark, MD;  Location: [facility] ENDOSCOPY;  Service: Endoscopy;  Laterality: N/A;   ENDOSCOPIC RETROGRADE CHOLANGIOPANCREATOGRAPHY (ERCP) WITH PROPOFOL N/A 10/15/2021   Procedure: ENDOSCOPIC RETROGRADE CHOLANGIOPANCREATOGRAPHY (ERCP) WITH PROPOFOL;  Surgeon: Jackquline Denmark, MD;  Location: [facility] ENDOSCOPY;  Service: Gastroenterology;  Laterality: N/A;   ENDOSCOPIC RETROGRADE CHOLANGIOPANCREATOGRAPHY (ERCP) WITH PROPOFOL N/A 12/09/2021   Procedure: ENDOSCOPIC RETROGRADE CHOLANGIOPANCREATOGRAPHY (ERCP) WITH PROPOFOL;  Surgeon: Rush Landmark Telford Nab., MD;  Location: [place];  Service: Gastroenterology;  Laterality: N/A;   ERCP N/A 03/09/2016   Procedure: ENDOSCOPIC RETROGRADE CHOLANGIOPANCREATOGRAPHY (ERCP);  Surgeon: Clarene Essex, MD;  Location: [facility];  Service: Endoscopy;  Laterality: N/A;   ERCP N/A 06/12/2018   Procedure: ENDOSCOPIC RETROGRADE CHOLANGIOPANCREATOGRAPHY (ERCP);  Surgeon: Clarene Essex, MD;  Location: [facility];  Service: Endoscopy;  Laterality: N/A;   ESOPHAGOGASTRODUODENOSCOPY N/A 06/12/2018   Procedure: ESOPHAGOGASTRODUODENOSCOPY (EGD);  Surgeon: Clarene Essex, MD;  Location: [facility];  Service: Endoscopy;  Laterality: N/A;   laser vein surgery     last year   LITHOTRIPSY  12/09/2021   Procedure: LITHOTRIPSY;  Surgeon: Mansouraty, Telford Nab., MD;  Location: [place];  Service: Gastroenterology;;   REMOVAL OF STONES  06/12/2018   Procedure: REMOVAL OF STONES;  Surgeon: Clarene Essex, MD;  Location: [facility]OSCOPY;  Service: Endoscopy;;   REMOVAL OF STONES  02/19/2020   Procedure: REMOVAL OF STONES;  Surgeon: Jackquline Denmark, MD;  Location: [place];  Service: Endoscopy;;   REMOVAL OF  STONES  10/15/2021   Procedure: REMOVAL OF STONES;  Surgeon: Jackquline Denmark, MD;  Location: [place];  Service: Gastroenterology;;   REMOVAL OF STONES  12/09/2021   Procedure: REMOVAL OF STONES;  Surgeon: Irving Copas., MD;  Location: [place];  Service: Gastroenterology;;   ROBOTIC ASSISTED SALPINGO OOPHERECTOMY Bilateral 12/21/2021   Procedure: XI ROBOTIC ASSISTED SALPINGO OOPHORECTOMY;  Surgeon: [name], MD;  Location: [facility];  Service: Gynecology;  Laterality: Bilateral;   SPHINCTEROTOMY  06/12/2018   Procedure: SPHINCTEROTOMY;  Surgeon: Clarene Essex, MD;  Location: [facility]OSCOPY;  Service: Endoscopy;;   SPHINCTEROTOMY  02/19/2020   Procedure: Joan Mayans;  Surgeon: Jackquline Denmark, MD;  Location: [facility] ENDOSCOPY;  Service: Endoscopy;;   SPYGLASS CHOLANGIOSCOPY N/A 10/15/2021   Procedure: [id number] CHOLANGIOSCOPY;  Surgeon: Jackquline Denmark, MD;  Location: [place];  Service: Gastroenterology;  Laterality: N/A;   SPYGLASS CHOLANGIOSCOPY N/A 12/09/2021   Procedure: SPYGLASS CHOLANGIOSCOPY;  Surgeon: Irving Copas., MD;  Location: [place];  Service: Gastroenterology;  Laterality: N/A;   SPYGLASS LITHOTRIPSY N/A 10/15/2021   Procedure: [id number] LITHOTRIPSY;  Surgeon: Jackquline Denmark, MD;  Location: [facility] ENDOSCOPY;  Service: Gastroenterology;  Laterality: N/A;   STENT REMOVAL  12/09/2021   Procedure: STENT REMOVAL;  Surgeon: Irving Copas., MD;  Location: [facility]COPY;  Service: Gastroenterology;;   TUBAL LIGATION     at time of c-section   VARICOSE VEIN SURGERY Left     Family History  Problem Relation Age of Onset   Memory loss Mother    Ovarian cancer Mother    Colon cancer Neg Hx    Breast cancer Neg Hx    Endometrial cancer Neg Hx    Pancreatic cancer Neg Hx    Prostate cancer Neg Hx     Social History   Socioeconomic History   Marital status: Married    Spouse name: Nathaneil Canary   Number of children: 4   Years of education: Not on file    Highest education level: Not on file  Occupational History   Not on file  Tobacco Use   Smoking status: Former    Packs/day: 0.50    Years: 35.00    Pack years: 17.50    Types: Cigarettes   Smokeless tobacco: Never   Tobacco comments:    No smoking since 02/2016  Vaping Use   Vaping Use: Never used  Substance and Sexual Activity   Alcohol use: No   Drug use: Never   Sexual activity: Not Currently    Partners: Male    Birth control/protection: Post-menopausal, Surgical    Comment: BTL  Other Topics Concern   Not on file  Social History Narrative   Not on file   Social Determinants of Health   Financial Resource Strain: Low Risk    Difficulty of Paying Living Expenses: Not hard at all  Food Insecurity: No Food Insecurity   Worried About Charity fundraiser in the Last Year: Never true   [place] in the Last Year: Never true  Transportation Needs: No Transportation Needs   Lack of Transportation (Medical): No   Lack of Transportation (Non-Medical): No  Physical Activity: Sufficiently Active   Days of Exercise per Week: 7 days   Minutes of Exercise per Session: 30 min  Stress: No Stress Concern Present   Feeling of Stress : Only a little  Social Connections: Moderately Isolated   Frequency of Communication with Friends and Family: More than three times a week   Frequency of Social  Gatherings with Friends and Family: Twice a week   Attends Religious Services: Never   Marine scientist or Organizations: No   Attends Music therapist: Never   Marital Status: Married    Current Medications:  Current Outpatient Medications:    acetaminophen (TYLENOL) 500 MG tablet, Take 500 mg by mouth every 6 (six) hours as needed for mild pain or moderate pain., Disp: , Rfl:    Calcium Carbonate-Vit D-Min (CALCIUM 1200 PO), Take 1 tablet by mouth daily., Disp: , Rfl:    Carboxymethylcellulose Sodium (THERATEARS) 0.25 % SOLN, Place 1 drop into both eyes as  needed (eye irritation)., Disp: , Rfl:    cetirizine (ZYRTEC) 10 MG tablet, Take 10 mg by mouth daily as needed for allergies., Disp: , Rfl:    famotidine (PEPCID) 20 MG tablet, TAKE 1 TABLET BY MOUTH EVERYDAY AT BEDTIME, Disp: 90 tablet, Rfl: 1   Multiple Vitamin (MULTIVITAMIN WITH MINERALS) TABS tablet, Take 1 tablet by mouth daily with lunch., Disp: , Rfl:    omeprazole (PRILOSEC) 40 MG capsule, TAKE 1 CAPSULE BY MOUTH EVERY DAY (Patient taking differently: Take 40 mg by mouth daily. TAKE 1 CAPSULE BY MOUTH EVERY DAY), Disp: 90 capsule, Rfl: 2   Probiotic Product (PROBIOTIC PO), Take 1 tablet by mouth daily., Disp: , Rfl:    ursodiol (ACTIGALL) 250 MG tablet, TAKE 1 TABLET IN THE MORNING AND AT BEDTIME. (MAY TAKE 1 TWICE A DAY OR 2 TAB(S) ONCE DAILY. (Patient taking differently: Take 250 mg by mouth 2 (two) times daily.), Disp: 180 tablet, Rfl: 3  Review of Systems: Denies appetite changes, fevers, chills, fatigue, unexplained weight changes. Denies hearing loss, neck lumps or masses, mouth sores, ringing in ears or voice changes. Denies cough or wheezing.  Denies shortness of breath. Denies chest pain or palpitations. Denies leg swelling. Denies abdominal distention, pain, blood in stools, constipation, diarrhea, nausea, vomiting, or early satiety. Denies pain with intercourse, dysuria, frequency, hematuria or incontinence. Denies hot flashes, pelvic pain, vaginal bleeding or vaginal discharge.   Denies joint pain, back pain or muscle pain/cramps. Denies itching, rash, or wounds. Denies dizziness, headaches, numbness or seizures. Denies swollen lymph nodes or glands, denies easy bruising or bleeding. Denies anxiety, depression, confusion, or decreased concentration.  Physical Exam: BP 113/77 (BP Location: Left Arm, Patient Position: Sitting)   Pulse 87   Temp 98.3 F (36.8 C) (Oral)   Resp 16   Ht '5\' 1"'$  (1.549 m)   Wt 120 lb 8 oz (54.7 kg)   SpO2 99%   BMI 22.77 kg/m  General:  Alert, oriented, no acute distress. HEENT: Normocephalic, atraumatic, sclera anicteric. Chest: Unlabored breathing on room air. Abdomen: soft, nontender.  Normoactive bowel sounds.  No masses or hepatosplenomegaly appreciated.  Well-healed incisions.  Remaining Dermabond removed and suture protruding from the midline incision was also removed. Extremities: Grossly normal range of motion.  Warm, well perfused.  No edema bilaterally.  Laboratory & Radiologic Studies: A. OVARY AND FALLOPIAN TUBE, RIGHT, SALPINGO OOPHORECTOMY:  - Benign mucinous cystadenoma.  - Benign inclusion cysts and calcification.  - Benign fimbriated fallopian tube.  No dysplasia or malignancy.   B. OVARY AND FALLOPIAN TUBE, LEFT, SALPINGO OOPHORECTOMY:  - Benign ovary.  No dysplasia or malignancy.  - Benign fimbriated fallopian tube.  No dysplasia or malignancy.   Assessment & Plan: Jasmine Bradford is a 67 y.o. woman s/p recent robotic BSO for adnexal mass in the setting of increased risk of ovarian cancer  presenting for follow-up. Final pathology benign.   Patient is doing very well and meeting postoperative milestones.  Discussed continued postoperative restrictions.  Filled out her back to work paperwork, she plans to return on 6/14 and will be able to do so without restriction.  Given a copy of her final pathology report which we reviewed again today.  Support given in the setting of recent death of her husband.    I discussed the assessment and treatment plan with the patient. The patient was provided with an opportunity to ask questions and all were answered. The patient agreed with the plan and demonstrated an understanding of the instructions.   18 minutes of total time was spent for this patient encounter, including preparation, face-to-face counseling with the patient and coordination of care, and documentation of the encounter.  Jeral Pinch, MD  Division of Gynecologic Oncology  Department of  Obstetrics and Gynecology  [facility]

## 2022-01-20 NOTE — Patient Instructions (Signed)
It was good to see you today.  You are healing well from surgery.  Remember no heavy lifting for 6 weeks after surgery.  Please not hesitate to call if you need anything in the future.

## 2022-01-21 DIAGNOSIS — M25561 Pain in right knee: Secondary | ICD-10-CM | POA: Diagnosis not present

## 2022-01-25 ENCOUNTER — Encounter: Payer: Self-pay | Admitting: Gastroenterology

## 2022-01-25 ENCOUNTER — Ambulatory Visit: Payer: Medicare HMO | Admitting: Gastroenterology

## 2022-01-25 VITALS — BP 104/60 | HR 90 | Ht 61.0 in | Wt 119.4 lb

## 2022-01-25 DIAGNOSIS — R0789 Other chest pain: Secondary | ICD-10-CM | POA: Diagnosis not present

## 2022-01-25 DIAGNOSIS — R0989 Other specified symptoms and signs involving the circulatory and respiratory systems: Secondary | ICD-10-CM | POA: Diagnosis not present

## 2022-01-25 DIAGNOSIS — Z86010 Personal history of colonic polyps: Secondary | ICD-10-CM | POA: Diagnosis not present

## 2022-01-25 DIAGNOSIS — K219 Gastro-esophageal reflux disease without esophagitis: Secondary | ICD-10-CM | POA: Diagnosis not present

## 2022-01-25 DIAGNOSIS — K8050 Calculus of bile duct without cholangitis or cholecystitis without obstruction: Secondary | ICD-10-CM

## 2022-01-25 NOTE — Progress Notes (Signed)
Chief Complaint:    Choledocholithiasis  GI History: 35 old female follows in the GI clinic for the following:   1) GERD: Index symptoms of dyspepsia, globus sensation.  No heartburn or regurgitation.  No dysphagia.  Well-controlled with Prilosec 40 mg/day. Possible bile acid reflux overlap.   2) Choledocholithiasis: History of cholelithiasis s/p ccy with IOC in 05/2016 with subsequent ERCP in 05/2018.  Readmitted 01/2020 with choledocholithiasis, underwent repeat ERCP with stone extraction.  Complicated by readmission 02/23/2020 with HCAP, pleural effusion treated with ABX and thoracentesis.  Started ursodiol for stone dissolution/choledocholithiasis prevention.  Readmitted 09/2021 with recurrent CDL as below.  MRI/MRCP with progressive biliary duct dilatation due to dominant 15 mm mid to distal common duct stone.  Underwent repeat ERCP on 10/15/2021 as below.  CA 19-9 was elevated, then subsequently normalized 1 week later.  Normal CEA.   3) History of colon polyps: Due for repeat colonoscopy in 2023 for ongoing polyp surveillance   Endoscopic history: -ERCP (02/2016, Dr. Watt Climes, [place]): Choledocholithiasis, removed with sphincterotomy and balloon sweeps -ERCP (05/2016, Dr. Watt Climes): Choledocholithiasis, removed with further sphincterotomy and balloon sweeps with removal of stones and sludge -EGD (05/2018, Dr. Watt Climes): Normal -ERCP (05/2018, Dr. Watt Climes): No stones on cholangiogram.  Balloon sweeps (nothing removed) and dilation of distal CBD/ampulla with 4 cm x 8 mm balloon dilator to facilitate improved drainage -Colonoscopy (09/2018, Dr. Watt Climes): External/internal hemorrhoids, sigmoid diverticulosis, 7 polyps resected (no path review), normal TI.  Recommended repeat in 3 years -ERCP (01/2020, Dr. Lyndel Safe): Removal of multiple stones and pus.  Normal post occlusion cholangiogram - ERCP (09/2021, Dr. Lyndel Safe): Dilated biliary tree with "sigmoid" appearing CBD measuring 15 mm.  Multiple filling defects c/w  choledocholithiasis.  Sphincteroplasty of the CBD with 13.5 mm balloon then biliary tree swept with removal of several small stones and sludge.  Largest stone could not be removed with 15 mm balloon.  Spy scope inserted.  No stricture noted.  Large stone noted in EHL performed with fracturing.  CBD then swept with 15 mm balloon several times with extraction of significant sludge.  Placed 10 French by 7 cm plastic stent into CBD.  - ERCP (12/09/2021, Dr. Rush Landmark): Severely dilated bile duct up to 18 mm significant angulation of the lower third of CBD without overt stricture, multiple filling defects in upper CBD removed with balloon sweeps and sphincteroplasty.  1 cm stone remained removed with lithotripsy and repeat balloon sweeps.  Spyglass performed with removal of additional retained stones.  No stones remained on occlusion cholangiogram.  Brushings performed for cytology (cytology benign).  Remains at risk for recurrent stones  HPI:     Patient is a 66 y.o. female presenting to the Gastroenterology Clinic for follow-up.  Last seen by me on 10/28/2021, and since then completed repeat ERCP with spyglass on 12/09/2021 as above.  Additionally, underwent robotic assisted bilateral salpingo-oophorectomy on 12/21/2021.  Sadly, her husband who has been battling lung cancer passed away 2 weeks ago in the hospital.  She was with him in the hospital his final moments.  Since then, has certainly been dealing with grief, with manifestation of GI symptoms (decreased appetite, change in bowel habits, abdominal discomfort, etc.).  These symptoms seem to be improving the last couple of days or so.  She is otherwise without any active issues or GI complaints.  Has been doing fine since most recent ERCP, and also recovering well from recent robotic assisted surgery.   Review of systems:     No chest  pain, no SOB, no fevers, no urinary sx   Past Medical History:  Diagnosis Date   Allergy    Cholelithiasis with  choledocholithiasis    Diverticulitis    GERD (gastroesophageal reflux disease)    Tobacco abuse    Varicose vein of leg    no problems now   Wears glasses     Patient's surgical history, family medical history, social history, medications and allergies were all reviewed in Epic    Current Outpatient Medications  Medication Sig Dispense Refill   acetaminophen (TYLENOL) 500 MG tablet Take 500 mg by mouth every 6 (six) hours as needed for mild pain or moderate pain.     Calcium Carbonate-Vit D-Min (CALCIUM 1200 PO) Take 1 tablet by mouth daily.     Carboxymethylcellulose Sodium (THERATEARS) 0.25 % SOLN Place 1 drop into both eyes as needed (eye irritation).     cetirizine (ZYRTEC) 10 MG tablet Take 10 mg by mouth daily as needed for allergies.     famotidine (PEPCID) 20 MG tablet TAKE 1 TABLET BY MOUTH EVERYDAY AT BEDTIME 90 tablet 1   Multiple Vitamin (MULTIVITAMIN WITH MINERALS) TABS tablet Take 1 tablet by mouth daily with lunch.     omeprazole (PRILOSEC) 40 MG capsule TAKE 1 CAPSULE BY MOUTH EVERY DAY (Patient taking differently: Take 40 mg by mouth daily. TAKE 1 CAPSULE BY MOUTH EVERY DAY) 90 capsule 2   Probiotic Product (PROBIOTIC PO) Take 1 tablet by mouth daily.     ursodiol (ACTIGALL) 250 MG tablet TAKE 1 TABLET IN THE MORNING AND AT BEDTIME. (MAY TAKE 1 TWICE A DAY OR 2 TAB(S) ONCE DAILY. (Patient taking differently: Take 250 mg by mouth 2 (two) times daily.) 180 tablet 3   No current facility-administered medications for this visit.    Physical Exam:     BP 104/60   Pulse 90   Ht '5\' 1"'$  (1.549 m)   Wt 119 lb 6 oz (54.1 kg)   BMI 22.56 kg/m   GENERAL:  Pleasant female in NAD PSYCH: : Cooperative, normal affect CARDIAC:  RRR, no murmur heard, no peripheral edema PULM: Normal respiratory effort, lungs CTA bilaterally, no wheezing ABDOMEN:  Nondistended, soft, nontender. No obvious masses, no hepatomegaly,  normal bowel sounds SKIN:  turgor, no lesions  seen Musculoskeletal:  Normal muscle tone, normal strength NEURO: Alert and oriented x 3, no focal neurologic deficits   IMPRESSION and PLAN:    1) Choledocholithiasis Unfortunately continues to have de novo stone formation and recurrent CDL, again requiring ERCP on 12/09/2021 with sphincteroplasty, multiple balloon sweeps, lithotripsy, and spyglass evaluation.  Per discussion with the advanced biliary Endoscopist, suspect this will continue to be an ongoing issue in the future.  Otherwise most recent liver enzymes normal and CA 19-9 has normalized.  CBD cytology negative/normal  - Continue ursodiol for possible stone dissolution and decreased frequency of recurrence - MRCP at 6 months (05/2022) and if recurrence of CDL, can again set up for ERCP with Dr. Rush Landmark  2) History of colon polyps - Plan for repeat colonoscopy later this year for ongoing polyp surveillance.  Certainly not the appropriate timeout given recency of pelvic surgery and loss of her husband 2 weeks ago.  This can be delayed for the time being  3) GERD - Reflux symptoms largely well controlled - Continue omeprazole - Continue antireflux lifestyle/dietary modifications  4) Atypical chest pain 5) Globus sensation - Overlapping symptoms possibly 2/2 bile acid reflux and/or esophageal hypersensitivity. - Good response  to trial of peppermint Altoids - If needed, could consider trial of venlafaxine in the future  6) Grief - Appropriate grief after the loss of her husband.  Provided support today and can also follow-up with her PCM  RTC in 6-12 months or sooner as needed  I spent 35 minutes of time, including in depth chart review, independent review of results as outlined above, communicating results with the patient directly, face-to-face time with the patient, coordinating care, and ordering studies and medications as appropriate, and documentation.    Lavena Bullion ,DO, FACG 01/25/2022, 1:38 PM

## 2022-01-25 NOTE — Patient Instructions (Addendum)
If you are age 68 or older, your body mass index should be between 23-30. Your Body mass index is 22.56 kg/m. If this is out of the aforementioned range listed, please consider follow up with your Primary Care Provider.  ________________________________________________________  The [facility] providers would like to encourage you to use [facility] to communicate with providers for non-urgent requests or questions.  Due to long hold times on the telephone, sending your provider a message by [facility] may be a faster and more efficient way to get a response.  Please allow 48 business hours for a response.  Please remember that this is for non-urgent requests.  _______________________________________________________  Due to recent changes in healthcare laws, you may see the results of your imaging and laboratory studies on MyChart before your provider has had a chance to review them.  We understand that in some cases there may be results that are confusing or concerning to you. Not all laboratory results come back in the same time frame and the provider may be waiting for multiple results in order to interpret others.  Please give [place] 48 hours in order for your provider to thoroughly review all the results before contacting the office for clarification of your results.   please call [phone] to schedule your MRI / MRCP in October 2023.  You have been scheduled for an MRI at ____________ on ______________. Your appointment time is __________. Please arrive to admitting (at main entrance of the hospital) 15 minutes prior to your appointment time for registration purposes. Please make certain not to have anything to eat or drink 6 hours prior to your test. In addition, if you have any metal in your body, have a pacemaker or defibrillator, please be sure to let your ordering physician know. This test typically takes 45 minutes to 1 hour to complete. Should you need to reschedule, please call [phone] to do  so.    Follow up in 6 to 12 months.  Thank you for choosing me and [facility].  Vito Cirigliano, D.O.

## 2022-01-27 ENCOUNTER — Other Ambulatory Visit: Payer: Self-pay | Admitting: Gastroenterology

## 2022-01-27 DIAGNOSIS — N6320 Unspecified lump in the left breast, unspecified quadrant: Secondary | ICD-10-CM

## 2022-02-01 DIAGNOSIS — S83241A Other tear of medial meniscus, current injury, right knee, initial encounter: Secondary | ICD-10-CM | POA: Diagnosis not present

## 2022-02-04 ENCOUNTER — Ambulatory Visit
Admission: RE | Admit: 2022-02-04 | Discharge: 2022-02-04 | Disposition: A | Discharge status: Home / Self Care | Payer: Medicare HMO | Source: Ambulatory Visit | Attending: Gastroenterology | Admitting: Gastroenterology

## 2022-02-04 ENCOUNTER — Other Ambulatory Visit: Payer: Self-pay | Admitting: Gastroenterology

## 2022-02-04 DIAGNOSIS — N6322 Unspecified lump in the left breast, upper inner quadrant: Secondary | ICD-10-CM | POA: Diagnosis not present

## 2022-02-04 DIAGNOSIS — N6320 Unspecified lump in the left breast, unspecified quadrant: Secondary | ICD-10-CM

## 2022-02-04 DIAGNOSIS — R922 Inconclusive mammogram: Secondary | ICD-10-CM | POA: Diagnosis not present

## 2022-02-04 DIAGNOSIS — N6311 Unspecified lump in the right breast, upper outer quadrant: Secondary | ICD-10-CM | POA: Diagnosis not present

## 2022-02-04 DIAGNOSIS — N6310 Unspecified lump in the right breast, unspecified quadrant: Secondary | ICD-10-CM

## 2022-02-04 DIAGNOSIS — N6312 Unspecified lump in the right breast, upper inner quadrant: Secondary | ICD-10-CM | POA: Diagnosis not present

## 2022-02-04 DIAGNOSIS — N6321 Unspecified lump in the left breast, upper outer quadrant: Secondary | ICD-10-CM | POA: Diagnosis not present

## 2022-02-09 DIAGNOSIS — M25561 Pain in right knee: Secondary | ICD-10-CM | POA: Diagnosis not present

## 2022-02-15 DIAGNOSIS — M25561 Pain in right knee: Secondary | ICD-10-CM | POA: Diagnosis not present

## 2022-02-24 DIAGNOSIS — M948X6 Other specified disorders of cartilage, lower leg: Secondary | ICD-10-CM | POA: Diagnosis not present

## 2022-02-24 DIAGNOSIS — S83241A Other tear of medial meniscus, current injury, right knee, initial encounter: Secondary | ICD-10-CM | POA: Diagnosis not present

## 2022-02-28 DIAGNOSIS — S83231D Complex tear of medial meniscus, current injury, right knee, subsequent encounter: Secondary | ICD-10-CM | POA: Diagnosis not present

## 2022-02-28 DIAGNOSIS — M6281 Muscle weakness (generalized): Secondary | ICD-10-CM | POA: Diagnosis not present

## 2022-02-28 DIAGNOSIS — M25661 Stiffness of right knee, not elsewhere classified: Secondary | ICD-10-CM | POA: Diagnosis not present

## 2022-02-28 DIAGNOSIS — R6889 Other general symptoms and signs: Secondary | ICD-10-CM | POA: Diagnosis not present

## 2022-03-03 DIAGNOSIS — R6889 Other general symptoms and signs: Secondary | ICD-10-CM | POA: Diagnosis not present

## 2022-03-08 DIAGNOSIS — S83231D Complex tear of medial meniscus, current injury, right knee, subsequent encounter: Secondary | ICD-10-CM | POA: Diagnosis not present

## 2022-03-08 DIAGNOSIS — M6281 Muscle weakness (generalized): Secondary | ICD-10-CM | POA: Diagnosis not present

## 2022-03-08 DIAGNOSIS — M25661 Stiffness of right knee, not elsewhere classified: Secondary | ICD-10-CM | POA: Diagnosis not present

## 2022-03-15 DIAGNOSIS — M25661 Stiffness of right knee, not elsewhere classified: Secondary | ICD-10-CM | POA: Diagnosis not present

## 2022-03-15 DIAGNOSIS — M6281 Muscle weakness (generalized): Secondary | ICD-10-CM | POA: Diagnosis not present

## 2022-03-15 DIAGNOSIS — S83231D Complex tear of medial meniscus, current injury, right knee, subsequent encounter: Secondary | ICD-10-CM | POA: Diagnosis not present

## 2022-03-21 DIAGNOSIS — M25661 Stiffness of right knee, not elsewhere classified: Secondary | ICD-10-CM | POA: Diagnosis not present

## 2022-03-21 DIAGNOSIS — M6281 Muscle weakness (generalized): Secondary | ICD-10-CM | POA: Diagnosis not present

## 2022-03-21 DIAGNOSIS — S83231D Complex tear of medial meniscus, current injury, right knee, subsequent encounter: Secondary | ICD-10-CM | POA: Diagnosis not present

## 2022-03-26 IMAGING — US US BREAST*L* LIMITED INC AXILLA
1 series · 13 of 14 positions shown · non-contrast
Comparison: Screening study dated 11/04/2019

CLINICAL DATA: Screening recall for a possible right breast
asymmetry and possible left axillary mass. This for from the
screening study dated 11/04/2019. There were no previous mammograms
available for comparison to that study.

EXAM:
DIGITAL DIAGNOSTIC BILATERAL MAMMOGRAM WITH CAD AND TOMO
ULTRASOUND BILATERAL BREAST

[Series 1: us breast*left* limited inc axilla · 0.07mm/px · 13 of 14 slices shown]
[im 1/14]
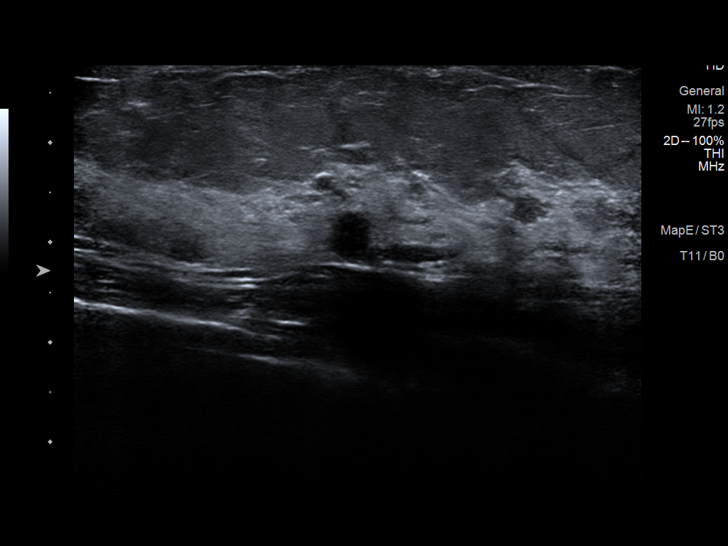
[im 2/14]
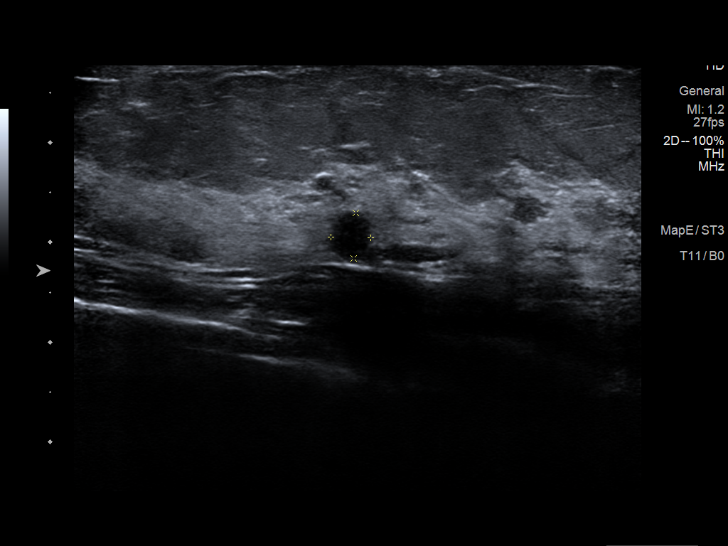
[im 3/14]
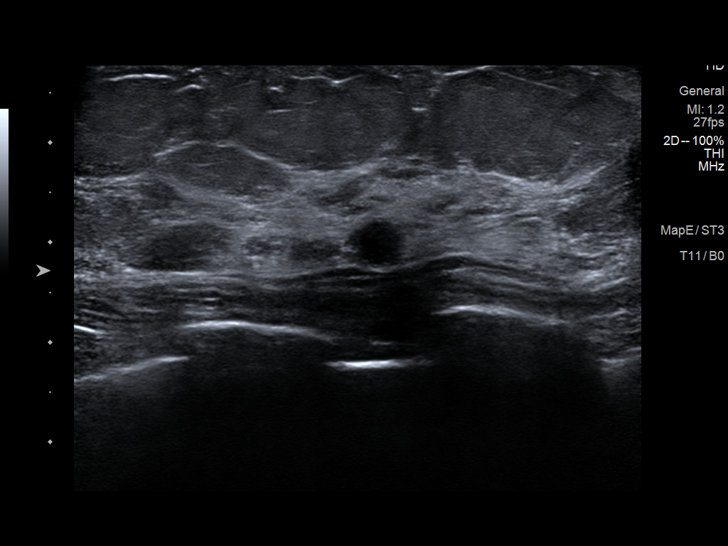
[im 4/14]
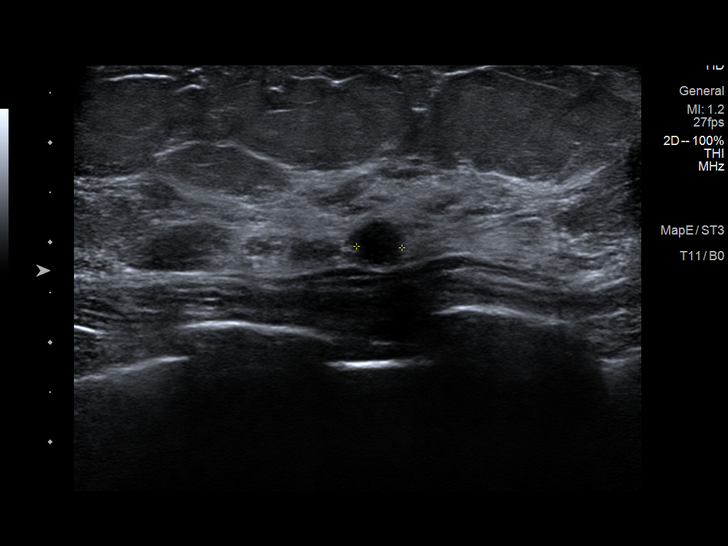
[im 5/14]
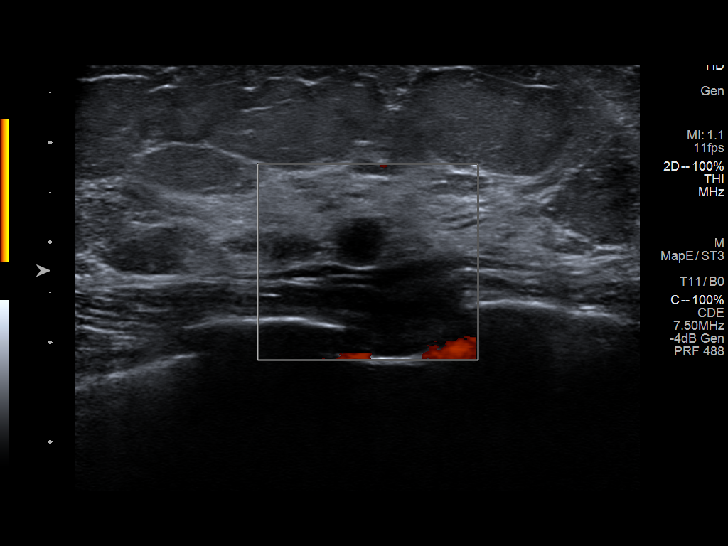
[im 6/14]
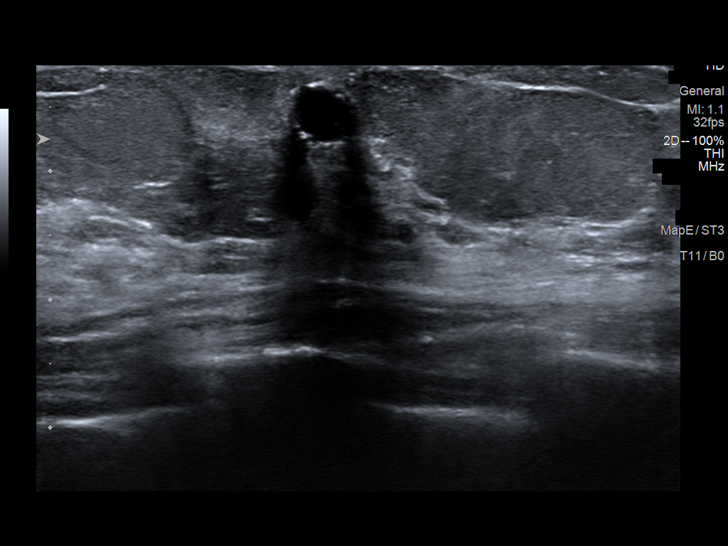
[im 8/14]
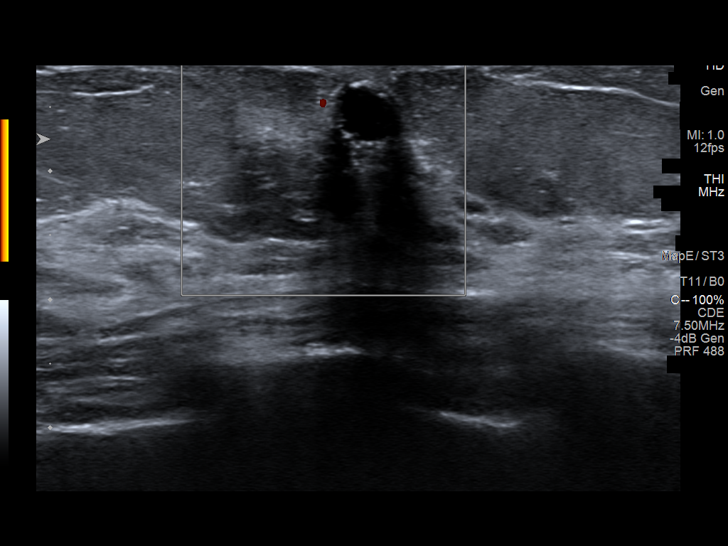
[im 9/14]
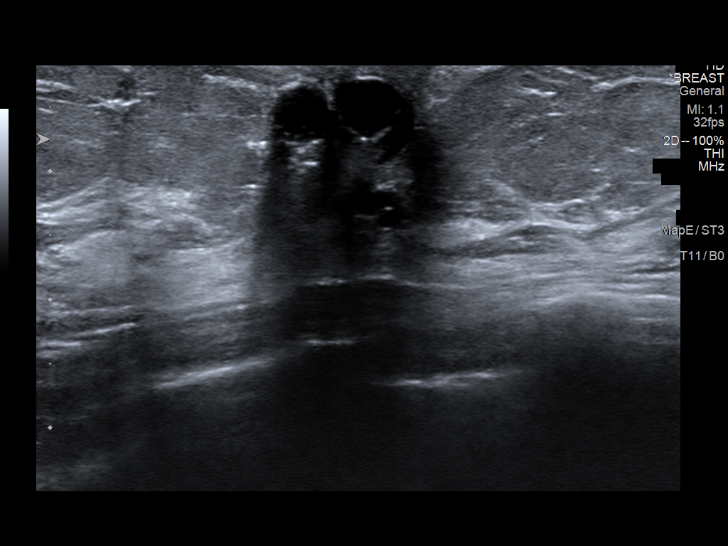
[im 10/14]
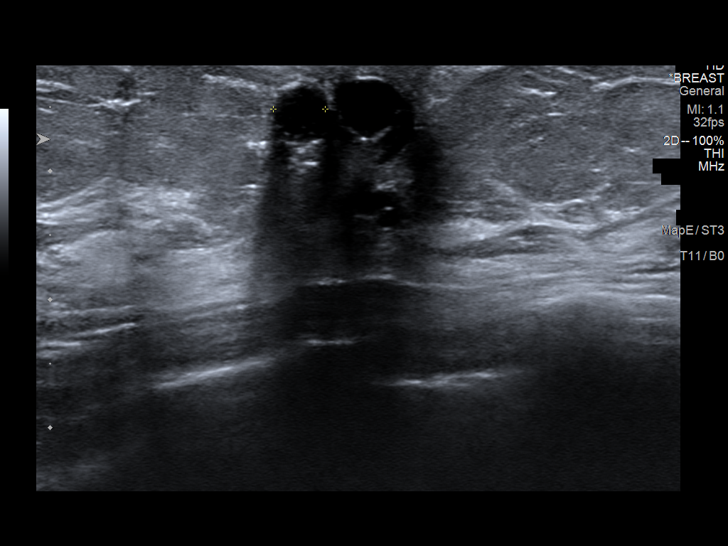
[im 11/14]
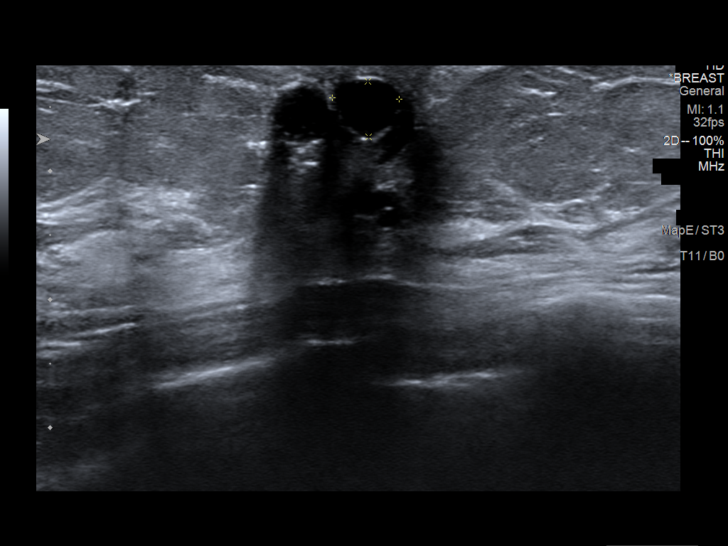
[im 12/14]
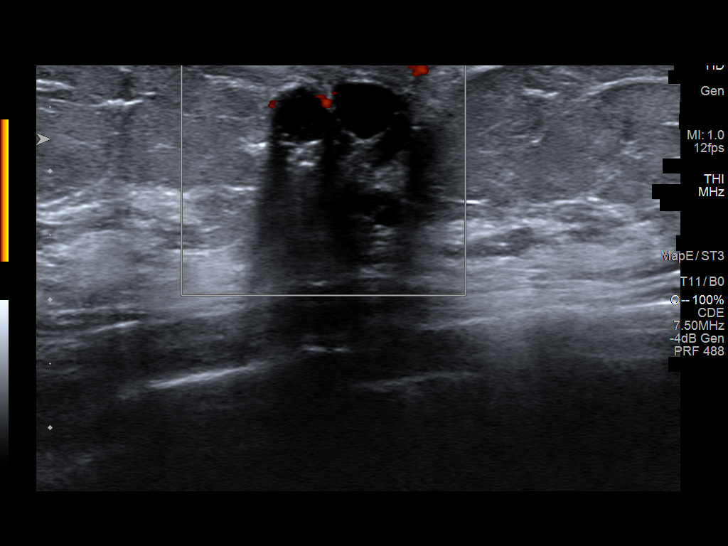
[im 13/14]
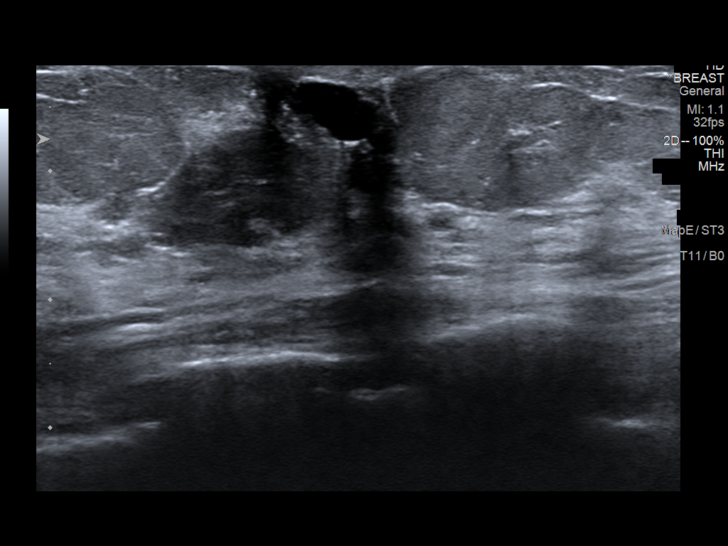
[im 14/14]
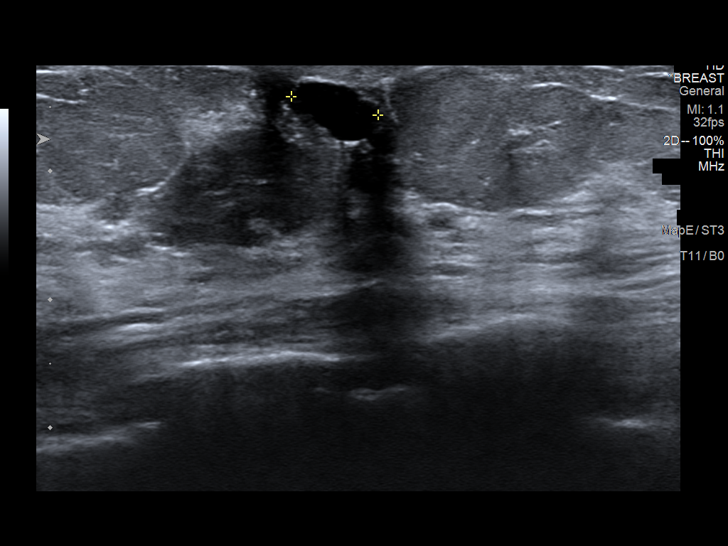

[13 of 14 positions shown; findings below may reference images not displayed]

ACR Breast Density Category c: The breast tissue is heterogeneously
dense, which may obscure small masses.
FINDINGS: In the right breast, the possible mass seen medially persists as a
small oval circumscribed mass, middle depth. There are no other
right breast masses, no areas of architectural distortion and no
suspicious calcifications.

In the left breast, the possible axillary mass is no longer
visualized, probably due to an area of fibroglandular tissue
superimposed on the pectoralis muscle on the screening study. There
are 2 or 3 small retroareolar masses that are evident on the
tomosynthesis images, as well as a peripherally calcified small mass
in the immediate retroareolar left breast. There are no areas of
architectural distortion and no suspicious calcifications.

Mammographic images were processed with CAD.

Targeted right breast ultrasound is performed, showing a simple cyst
in the 3 o'clock position, retroareolar, middle depth, measuring 6 x
3 x 6 mm, consistent in size, shape and location to the mammographic
mass.

Targeted left breast ultrasound is performed, showing 2 adjacent
retroareolar cysts, 1 with peripheral calcification, corresponding
to the peripherally calcified retroareolar mass noted
mammographically. In the deeper aspect of the medial, central left
breast, there is a hypoechoic round mass, with no internal blood
flow on color Doppler analysis, measuring 5 x 4 x 5 mm, most likely
a mildly complicated cyst.
IMPRESSION: 1. Small apparent masses in the retroareolar left breast, 1 of which
is in the central medial aspect of the left breast. This latter
mammographic lesion likely corresponds to the small, 5 mm,
ultrasound mass and is likely a mildly complicated cyst. The left
breast findings are probably benign and short-term follow-up is
recommended.
2. Small benign right breast cyst.

RECOMMENDATION:
Diagnostic left breast mammography and ultrasound in 6 months.

I have discussed the findings and recommendations with the patient.
If applicable, a reminder letter will be sent to the patient
regarding the next appointment.

BI-RADS CATEGORY  3: Probably benign.

## 2022-03-26 IMAGING — MG DIGITAL DIAGNOSTIC BILAT W/ TOMO W/ CAD
8 series · 8 of 24 positions shown · non-contrast
Comparison: Screening study dated 11/04/2019

CLINICAL DATA: Screening recall for a possible right breast
asymmetry and possible left axillary mass. This for from the
screening study dated 11/04/2019. There were no previous mammograms
available for comparison to that study.

EXAM:
DIGITAL DIAGNOSTIC BILATERAL MAMMOGRAM WITH CAD AND TOMO
ULTRASOUND BILATERAL BREAST

[L CC synth-2D]
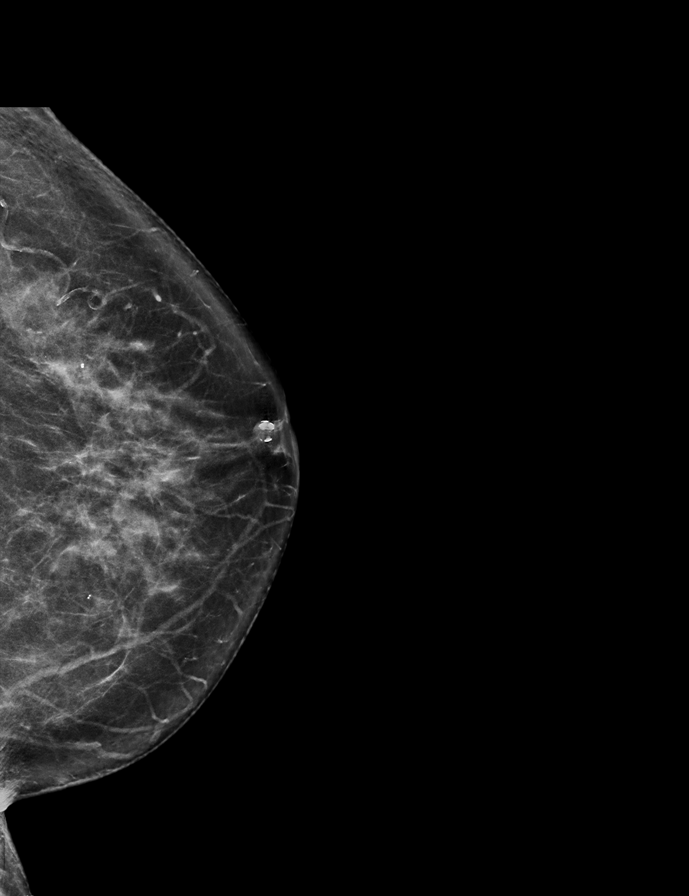

[R MLO synth-2D]
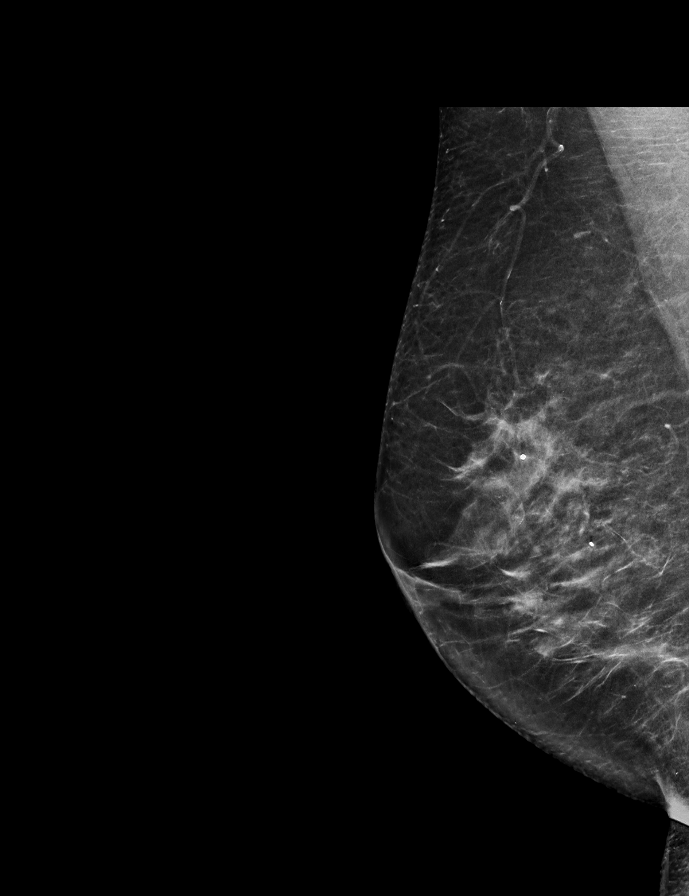

[L MLO synth-2D]
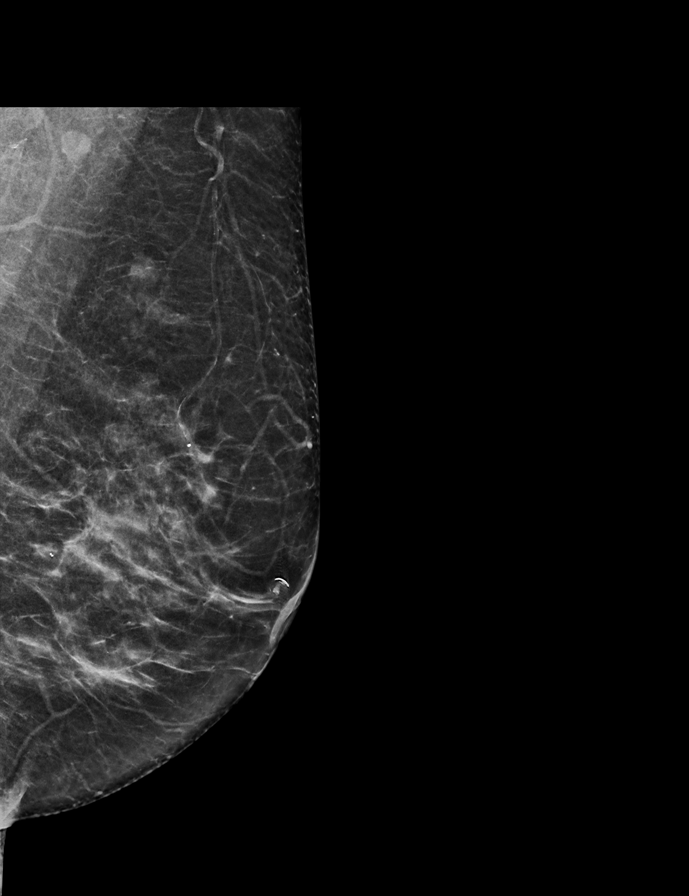

[R CC synth-2D]
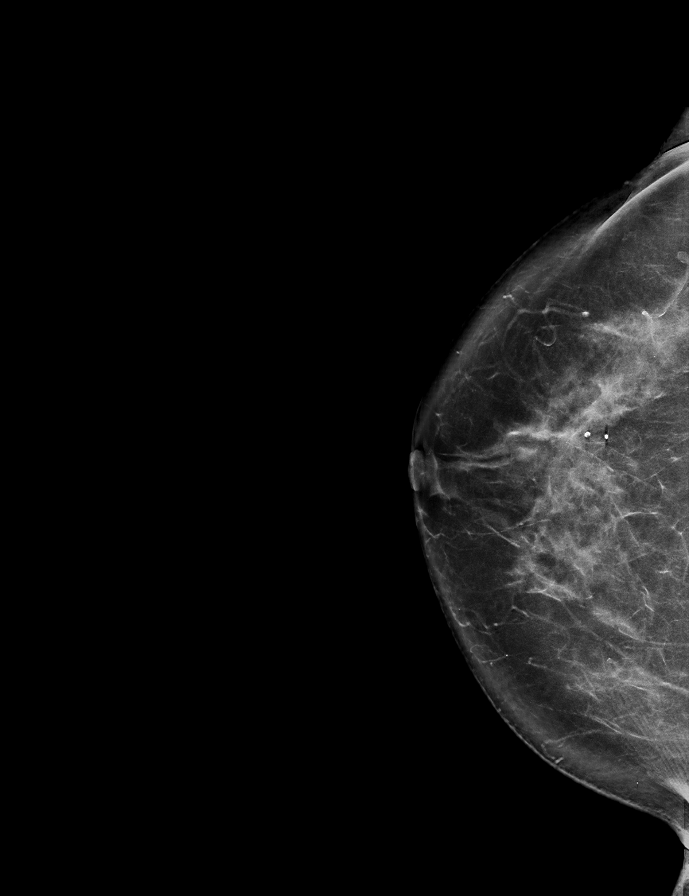

[R CC tomo · tomo slice 39/76.0]
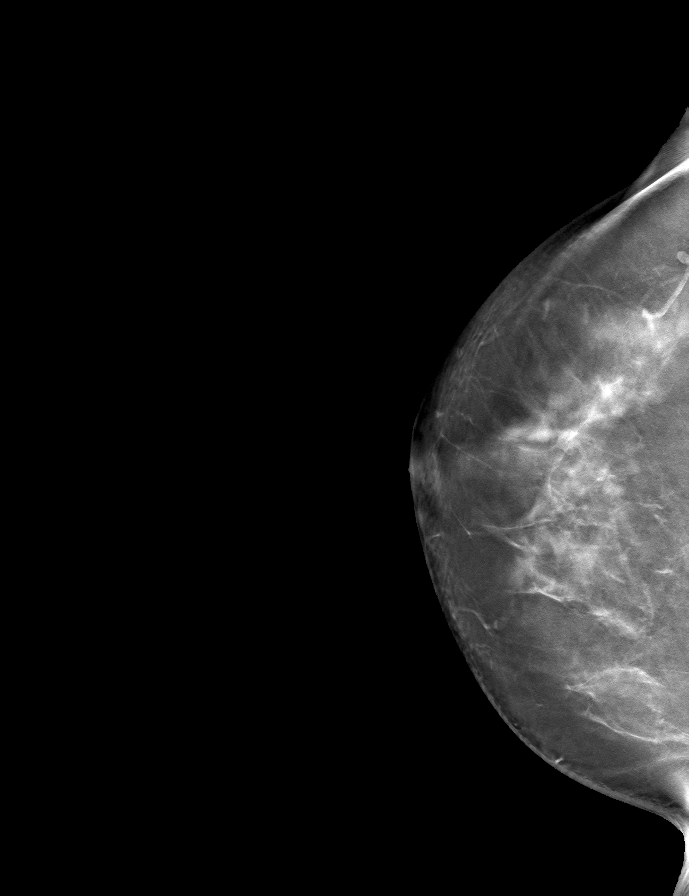

[L CC tomo · tomo slice 35/68.0]
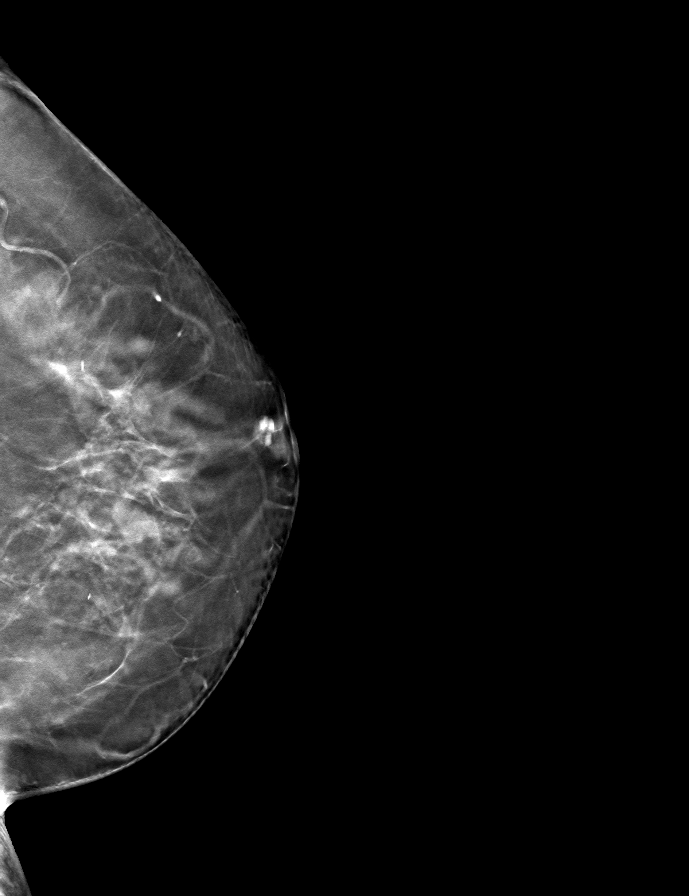

[R MLO tomo · tomo slice 34/67.0]
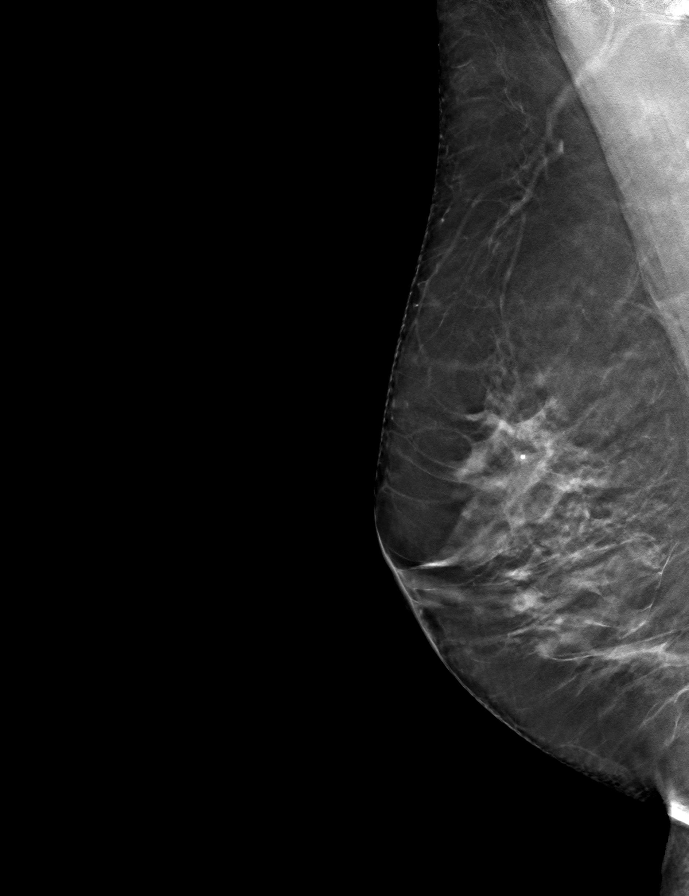

[L MLO tomo · tomo slice 33/65.0]
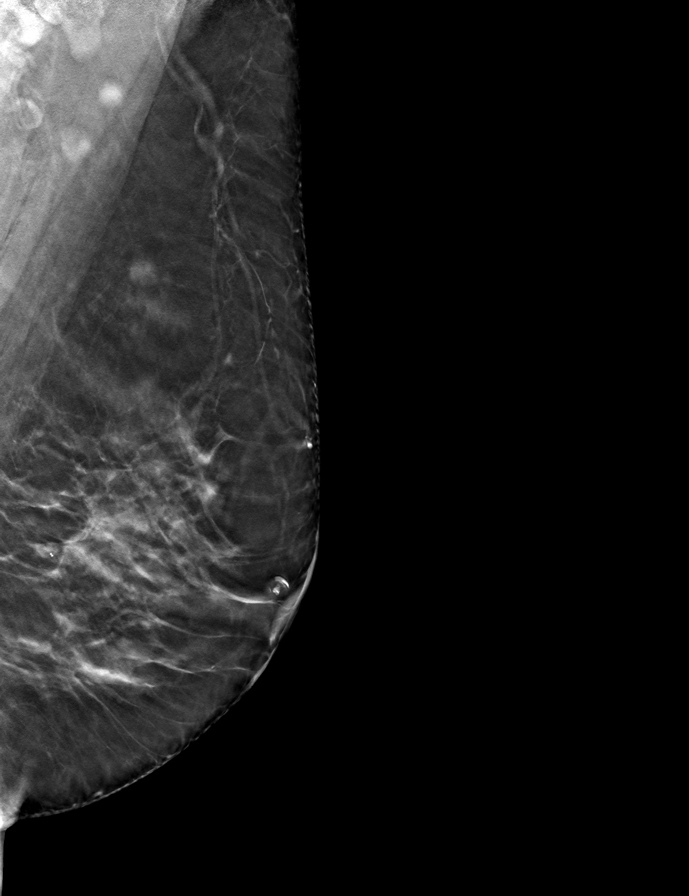

[8 of 24 positions shown; findings below may reference images not displayed]

ACR Breast Density Category c: The breast tissue is heterogeneously
dense, which may obscure small masses.
FINDINGS: In the right breast, the possible mass seen medially persists as a
small oval circumscribed mass, middle depth. There are no other
right breast masses, no areas of architectural distortion and no
suspicious calcifications.

In the left breast, the possible axillary mass is no longer
visualized, probably due to an area of fibroglandular tissue
superimposed on the pectoralis muscle on the screening study. There
are 2 or 3 small retroareolar masses that are evident on the
tomosynthesis images, as well as a peripherally calcified small mass
in the immediate retroareolar left breast. There are no areas of
architectural distortion and no suspicious calcifications.

Mammographic images were processed with CAD.

Targeted right breast ultrasound is performed, showing a simple cyst
in the 3 o'clock position, retroareolar, middle depth, measuring 6 x
3 x 6 mm, consistent in size, shape and location to the mammographic
mass.

Targeted left breast ultrasound is performed, showing 2 adjacent
retroareolar cysts, 1 with peripheral calcification, corresponding
to the peripherally calcified retroareolar mass noted
mammographically. In the deeper aspect of the medial, central left
breast, there is a hypoechoic round mass, with no internal blood
flow on color Doppler analysis, measuring 5 x 4 x 5 mm, most likely
a mildly complicated cyst.
IMPRESSION: 1. Small apparent masses in the retroareolar left breast, 1 of which
is in the central medial aspect of the left breast. This latter
mammographic lesion likely corresponds to the small, 5 mm,
ultrasound mass and is likely a mildly complicated cyst. The left
breast findings are probably benign and short-term follow-up is
recommended.
2. Small benign right breast cyst.

RECOMMENDATION:
Diagnostic left breast mammography and ultrasound in 6 months.

I have discussed the findings and recommendations with the patient.
If applicable, a reminder letter will be sent to the patient
regarding the next appointment.

BI-RADS CATEGORY  3: Probably benign.

## 2022-03-28 DIAGNOSIS — M6281 Muscle weakness (generalized): Secondary | ICD-10-CM | POA: Diagnosis not present

## 2022-03-28 DIAGNOSIS — M25661 Stiffness of right knee, not elsewhere classified: Secondary | ICD-10-CM | POA: Diagnosis not present

## 2022-03-28 DIAGNOSIS — S83231D Complex tear of medial meniscus, current injury, right knee, subsequent encounter: Secondary | ICD-10-CM | POA: Diagnosis not present

## 2022-04-18 ENCOUNTER — Other Ambulatory Visit: Payer: Self-pay | Admitting: Gastroenterology

## 2022-05-27 ENCOUNTER — Other Ambulatory Visit: Payer: Self-pay | Admitting: Gastroenterology

## 2022-05-27 DIAGNOSIS — R1013 Epigastric pain: Secondary | ICD-10-CM

## 2022-05-30 DIAGNOSIS — H524 Presbyopia: Secondary | ICD-10-CM | POA: Diagnosis not present

## 2022-05-30 DIAGNOSIS — H5203 Hypermetropia, bilateral: Secondary | ICD-10-CM | POA: Diagnosis not present

## 2022-05-30 DIAGNOSIS — H52209 Unspecified astigmatism, unspecified eye: Secondary | ICD-10-CM | POA: Diagnosis not present

## 2022-05-31 DIAGNOSIS — M25561 Pain in right knee: Secondary | ICD-10-CM | POA: Diagnosis not present

## 2022-06-06 ENCOUNTER — Ambulatory Visit (HOSPITAL_COMMUNITY): Payer: Medicare HMO

## 2022-06-07 ENCOUNTER — Ambulatory Visit (INDEPENDENT_AMBULATORY_CARE_PROVIDER_SITE_OTHER): Payer: Medicare HMO

## 2022-06-07 VITALS — Ht 61.0 in | Wt 120.0 lb

## 2022-06-07 DIAGNOSIS — Z0000 Encounter for general adult medical examination without abnormal findings: Secondary | ICD-10-CM

## 2022-06-07 NOTE — Progress Notes (Signed)
Subjective:   Jasmine Bradford is a 66 y.o. female who presents for an Initial Medicare Annual Wellness Visit.   I connected with  [name] on 06/07/22 by a audio enabled telemedicine application and verified that I am speaking with the correct person using two identifiers.  Patient Location: Home  Provider Location: Home Office  I discussed the limitations of evaluation and management by telemedicine. The patient expressed understanding and agreed to proceed.  Review of Systems     Cardiac Risk Factors include: advanced age (>62mn, >>66women)     Objective:    Today's Vitals   06/07/22 1444  Weight: 120 lb (54.4 kg)  Height: '5\' 1"'$  (1.549 m)   Body mass index is 22.67 kg/m.     06/07/2022    2:50 PM 12/09/2021    6:52 AM 12/08/2021    7:56 AM 11/19/2021   12:10 PM 10/15/2021    8:00 AM 10/14/2021    4:20 AM 02/23/2020    3:00 PM  Advanced Directives  Does Patient Have a Medical Advance Directive? Yes No No No No No No  Type of AParamedicof [place] will        Copy of HPotosiin Chart? No - copy requested        Would patient like information on creating a medical advance directive?    Yes (MAU/Ambulatory/Procedural Areas - Information given) No - Patient declined  No - Patient declined    Current Medications (verified) Outpatient Encounter Medications as of 06/07/2022  Medication Sig   acetaminophen (TYLENOL) 500 MG tablet Take 500 mg by mouth every 6 (six) hours as needed for mild pain or moderate pain.   Calcium Carbonate-Vit D-Min (CALCIUM 1200 PO) Take 1 tablet by mouth daily.   Carboxymethylcellulose Sodium (THERATEARS) 0.25 % SOLN Place 1 drop into both eyes as needed (eye irritation).   cetirizine (ZYRTEC) 10 MG tablet Take 10 mg by mouth daily as needed for allergies.   famotidine (PEPCID) 20 MG tablet TAKE 1 TABLET BY MOUTH EVERYDAY AT BEDTIME   meloxicam (MOBIC) 15 MG tablet Take 15 mg  by mouth daily.   Multiple Vitamin (MULTIVITAMIN WITH MINERALS) TABS tablet Take 1 tablet by mouth daily with lunch.   omeprazole (PRILOSEC) 40 MG capsule TAKE 1 CAPSULE BY MOUTH EVERY DAY   Probiotic Product (PROBIOTIC PO) Take 1 tablet by mouth daily.   ursodiol (ACTIGALL) 250 MG tablet Take 1 tablet (250 mg total) by mouth 2 (two) times daily.   No facility-administered encounter medications on file as of 06/07/2022.    Allergies (verified) Oxycodone, Potassium, and Tape   History: Past Medical History:  Diagnosis Date   Allergy    Cholelithiasis with choledocholithiasis    Diverticulitis    GERD (gastroesophageal reflux disease)    Tobacco abuse    Varicose vein of leg    no problems now   Wears glasses    Past Surgical History:  Procedure Laterality Date   BILIARY BRUSHING  12/09/2021   Procedure: BILIARY BRUSHING;  Surgeon: MIrving Copas, MD;  [place]  Service: Gastroenterology;;   BILIARY DILATION  02/19/2020   Procedure: BILIARY DILATION;  Surgeon: GJackquline Denmark MD;  [facility]  Service: Endoscopy;;   BILIARY DILATION  10/15/2021   Procedure: BILIARY DILATION;  Surgeon: GJackquline Denmark MD;  [place]  Service: Gastroenterology;;   BILIARY DILATION  12/09/2021   Procedure: BILIARY DILATION;  Surgeon:  Mansouraty, Telford Nab., MD;  Location: [place];  Service: Gastroenterology;;   BILIARY STENT PLACEMENT  10/15/2021   Procedure: BILIARY STENT PLACEMENT;  Surgeon: Jackquline Denmark, MD;  Location: [facility] ENDOSCOPY;  Service: Gastroenterology;;   CESAREAN SECTION     CHOLECYSTECTOMY N/A 05/23/2016   Procedure: LAPAROSCOPIC CHOLECYSTECTOMY WITH INTRAOPERATIVE CHOLANGIOGRAM;  Surgeon: Jackolyn Confer, MD;  Location: [place];  Service: General;  Laterality: N/A;   ENDOSCOPIC RETROGRADE CHOLANGIOPANCREATOGRAPHY (ERCP) WITH PROPOFOL N/A 06/06/2016   Procedure: ENDOSCOPIC RETROGRADE CHOLANGIOPANCREATOGRAPHY (ERCP) WITH PROPOFOL;   Surgeon: Clarene Essex, MD;  Location[facility];  Service: Endoscopy;  Laterality: N/A;   ENDOSCOPIC RETROGRADE CHOLANGIOPANCREATOGRAPHY (ERCP) WITH PROPOFOL N/A 02/19/2020   Procedure: ENDOSCOPIC RETROGRADE CHOLANGIOPANCREATOGRAPHY (ERCP) WITH PROPOFOL;  Surgeon: Jackquline Denmark, MD;  Location: [facility] ENDOSCOPY;  Service: Endoscopy;  Laterality: N/A;   ENDOSCOPIC RETROGRADE CHOLANGIOPANCREATOGRAPHY (ERCP) WITH PROPOFOL N/A 10/15/2021   Procedure: ENDOSCOPIC RETROGRADE CHOLANGIOPANCREATOGRAPHY (ERCP) WITH PROPOFOL;  Surgeon: Jackquline Denmark, MD;  Location: [facility] ENDOSCOPY;  Service: Gastroenterology;  Laterality: N/A;   ENDOSCOPIC RETROGRADE CHOLANGIOPANCREATOGRAPHY (ERCP) WITH PROPOFOL N/A 12/09/2021   Procedure: ENDOSCOPIC RETROGRADE CHOLANGIOPANCREATOGRAPHY (ERCP) WITH PROPOFOL;  Surgeon: Rush Landmark Telford Nab., MD;  Location: [place];  Service: Gastroenterology;  Laterality: N/A;   ERCP N/A 03/09/2016   Procedure: ENDOSCOPIC RETROGRADE CHOLANGIOPANCREATOGRAPHY (ERCP);  Surgeon: Clarene Essex, MD;  Location: [facility];  Service: Endoscopy;  Laterality: N/A;   ERCP N/A 06/12/2018   Procedure: ENDOSCOPIC RETROGRADE CHOLANGIOPANCREATOGRAPHY (ERCP);  Surgeon: Clarene Essex, MD;  Location: [facility];  Service: Endoscopy;  Laterality: N/A;   ESOPHAGOGASTRODUODENOSCOPY N/A 06/12/2018   Procedure: ESOPHAGOGASTRODUODENOSCOPY (EGD);  Surgeon: Clarene Essex, MD;  Location: [facility];  Service: Endoscopy;  Laterality: N/A;   laser vein surgery     last year   LITHOTRIPSY  12/09/2021   Procedure: LITHOTRIPSY;  Surgeon: Mansouraty, Telford Nab., MD;  Location: [place];  Service: Gastroenterology;;   REMOVAL OF STONES  06/12/2018   Procedure: REMOVAL OF STONES;  Surgeon: Clarene Essex, MD;  Location: [facility] ENDOSCOPY;  Service: Endoscopy;;   REMOVAL OF STONES  02/19/2020   Procedure: REMOVAL OF STONES;  Surgeon: Jackquline Denmark, MD;  Location: [facility] ENDOSCOPY;  Service: Endoscopy;;   REMOVAL OF STONES  10/15/2021    Procedure: REMOVAL OF STONES;  Surgeon: Jackquline Denmark, MD;  Location: [place];  Service: Gastroenterology;;   REMOVAL OF STONES  12/09/2021   Procedure: REMOVAL OF STONES;  Surgeon: Irving Copas., MD;  Location: [place];  Service: Gastroenterology;;   ROBOTIC ASSISTED SALPINGO OOPHERECTOMY Bilateral 12/21/2021   Procedure: XI ROBOTIC ASSISTED SALPINGO OOPHORECTOMY;  Surgeon: [name], MD;  Location: [facility];  Service: Gynecology;  Laterality: Bilateral;   SPHINCTEROTOMY  06/12/2018   Procedure: SPHINCTEROTOMY;  Surgeon: Clarene Essex, MD;  Location: [facility] ENDOSCOPY;  Service: Endoscopy;;   SPHINCTEROTOMY  02/19/2020   Procedure: Joan Mayans;  Surgeon: Jackquline Denmark, MD;  Location: [facility] ENDOSCOPY;  Service: Endoscopy;;   SPYGLASS CHOLANGIOSCOPY N/A 10/15/2021   Procedure: RJJOACZY CHOLANGIOSCOPY;  Surgeon: Jackquline Denmark, MD;  Location: [place];  Service: Gastroenterology;  Laterality: N/A;   SPYGLASS CHOLANGIOSCOPY N/A 12/09/2021   Procedure: SPYGLASS CHOLANGIOSCOPY;  Surgeon: Irving Copas., MD;  Location: [place];  Service: Gastroenterology;  Laterality: N/A;   SPYGLASS LITHOTRIPSY N/A 10/15/2021   Procedure: SAYTKZSW LITHOTRIPSY;  Surgeon: Jackquline Denmark, MD;  Location: [facility] ENDOSCOPY;  Service: Gastroenterology;  Laterality: N/A;   STENT REMOVAL  12/09/2021   Procedure: STENT REMOVAL;  Surgeon: Irving Copas., MD;  Location[place];  Service: Gastroenterology;;  TUBAL LIGATION     at time of c-section   VARICOSE VEIN SURGERY Left    Family History  Problem Relation Age of Onset   Memory loss Mother    Ovarian cancer Mother    Colon cancer Neg Hx    Breast cancer Neg Hx    Endometrial cancer Neg Hx    Pancreatic cancer Neg Hx    Prostate cancer Neg Hx    Social History   Socioeconomic History   Marital status: Married    Spouse name: Nathaneil Canary   Number of children: 4   Years of education: Not on file   Highest education level:  Not on file  Occupational History   Not on file  Tobacco Use   Smoking status: Former    Packs/day: 0.50    Years: 35.00    Total pack years: 17.50    Types: Cigarettes   Smokeless tobacco: Never   Tobacco comments:    No smoking since 02/2016  Vaping Use   Vaping Use: Never used  Substance and Sexual Activity   Alcohol use: No   Drug use: Never   Sexual activity: Not Currently    Partners: Male    Birth control/protection: Post-menopausal, Surgical    Comment: BTL  Other Topics Concern   Not on file  Social History Narrative   Not on file   Social Determinants of Health   Financial Resource Strain: Low Risk  (06/07/2022)   Overall Financial Resource Strain (CARDIA)    Difficulty of Paying Living Expenses: Not hard at all  Food Insecurity: No Food Insecurity (06/07/2022)   Hunger Vital Sign    Worried About Running Out of Food in the Last Year: Never true    Ran Out of Food in the Last Year: Never true  Transportation Needs: No Transportation Needs (06/07/2022)   PRAPARE - Hydrologist (Medical): No    Lack of Transportation (Non-Medical): No  Physical Activity: Sufficiently Active (06/07/2022)   Exercise Vital Sign    Days of Exercise per Week: 5 days    Minutes of Exercise per Session: 60 min  Stress: No Stress Concern Present (06/07/2022)   [place]    Feeling of Stress : Not at all  Social Connections: Socially Isolated (06/07/2022)   Social Connection and Isolation Panel [NHANES]    Frequency of Communication with Friends and Family: More than three times a week    Frequency of Social Gatherings with Friends and Family: More than three times a week    Attends Religious Services: Never    Marine scientist or Organizations: No    Attends Archivist Meetings: Never    Marital Status: Widowed    Tobacco Counseling Counseling given: Not  Answered Tobacco comments: No smoking since 02/2016   Clinical Intake:              How often do you need to have someone help you when you read instructions, pamphlets, or other written materials from your doctor or pharmacy?: (P) 1 - Never  Diabetic?no          Activities of Daily Living    06/07/2022    2:51 PM 06/06/2022    4:33 PM  In your present state of health, do you have any difficulty performing the following activities:  Hearing? 0 0  Vision? 0 0  Difficulty concentrating or making decisions? 0 0  Walking  or climbing stairs? 0 0  Dressing or bathing? 0 0  Doing errands, shopping? 0 0  Preparing Food and eating ? N N  Using the Toilet? N N  In the past six months, have you accidently leaked urine? N N  Do you have problems with loss of bowel control? N N  Managing your Medications? N N  Managing your Finances? N N  Housekeeping or managing your Housekeeping? N N    Patient Care Team: Nche, Charlene Brooke, NP as PCP - General (Internal Medicine)  Indicate any recent Medical Services you may have received from other than [facility] providers in the past year (date may be approximate).     Assessment:   This is a routine wellness examination for Melody.  Hearing/Vision screen Vision Screening - Comments:: Annual eye exams wears glasses   Dietary issues and exercise activities discussed: Current Exercise Habits: Home exercise routine, Type of exercise: walking, Time (Minutes): 60, Frequency (Times/Week): 5, Weekly Exercise (Minutes/Week): 300, Intensity: Mild, Exercise limited by: None identified   Goals Addressed             This Visit's Progress    DIET - INCREASE WATER INTAKE         Depression Screen    06/07/2022    2:49 PM 11/19/2021    1:14 PM 10/21/2021    9:36 AM 05/15/2021    9:41 AM 05/15/2021    9:34 AM 08/09/2019    3:48 PM 02/10/2015    8:31 AM  PHQ 2/9 Scores  PHQ - 2 Score 0 0 0 0 0 0 0  PHQ- 9 Score  2 0        Fall Risk     06/07/2022    2:45 PM 06/06/2022    4:33 PM 05/23/2022    5:03 PM 06/22/2021    1:01 PM 05/15/2021    9:41 AM  [place] in the past year? 0 0 0   0 0 0  Number falls in past yr: 0 0 0   0 0 0  Injury with Fall? 0 0 0   0 0 0  Risk for fall due to : No Fall Risks   No Fall Risks History of fall(s)  Follow up Falls prevention discussed   Falls evaluation completed Falls evaluation completed    FALL RISK PREVENTION PERTAINING TO THE HOME:  Any stairs in or around the home? No  If so, are there any without handrails? No  Home free of loose throw rugs in walkways, pet beds, electrical cords, etc? Yes  Adequate lighting in your home to reduce risk of falls? Yes   ASSISTIVE DEVICES UTILIZED TO PREVENT FALLS:  Life alert? No  Use of a cane, walker or w/c? No  Grab bars in the bathroom? No  Shower chair or bench in shower? No  Elevated toilet seat or a handicapped toilet? No          06/07/2022    2:51 PM 05/15/2021    9:39 AM  6CIT Screen  What Year? 0 points 0 points  What month? 0 points 0 points  What time? 0 points 0 points  Count back from 20 0 points 0 points  Months in reverse 0 points 0 points  Repeat phrase 0 points 0 points  Total Score 0 points 0 points    Immunizations Immunization History  Administered Date(s) Administered   Fluad Quad(high Dose 65+) 04/25/2020   Influenza, Quadrivalent, Recombinant, Inj,  Pf 06/09/2019   Influenza,inj,Quad PF,6+ Mos 06/07/2016, 05/27/2018   Influenza-Unspecified 05/31/2021   PFIZER(Purple Top)SARS-COV-2 Vaccination 10/13/2019, 11/04/2019, 06/13/2020, 01/10/2021   Pneumococcal Conjugate-13 06/09/2019   Pneumococcal Polysaccharide-23 06/20/2020   Tdap 02/10/2015   Zoster Recombinat (Shingrix) 10/19/2017, 02/06/2018    TDAP status: Up to date  Flu Vaccine status: Up to date  Pneumococcal vaccine status: Up to date  Covid-19 vaccine status: Completed vaccines  Qualifies for Shingles Vaccine? Yes   Zostavax  completed No   Shingrix Completed?: Yes  Screening Tests Health Maintenance  Topic Date Due   COVID-19 Vaccine (5 - Pfizer series) 03/07/2021   INFLUENZA VACCINE  03/22/2022   MAMMOGRAM  02/05/2023   TETANUS/TDAP  02/09/2025   COLONOSCOPY (Pts 45-21yr Insurance coverage will need to be confirmed)  10/16/2028   Pneumonia Vaccine 68 Years old  Completed   DEXA SCAN  Completed   Hepatitis C Screening  Completed   Zoster Vaccines- Shingrix  Completed   HPV VACCINES  Aged Out    Health Maintenance  Health Maintenance Due  Topic Date Due   COVID-19 Vaccine (5 - Pfizer series) 03/07/2021   INFLUENZA VACCINE  03/22/2022    Colorectal cancer screening: Type of screening: Colonoscopy. Completed 10/16/2018. Repeat every 10 years  Mammogram status: Completed 02/04/2022. Repeat every year  Bone Density status: Completed 02/05/2021. Results reflect: Bone density results: OSTEOPENIA. Repeat every 5 years.  Lung Cancer Screening: (Low Dose CT Chest recommended if Age 68-80years, 30 pack-year currently smoking OR have quit w/in 15years.) does not qualify.   Lung Cancer Screening Referral: n/a  Additional Screening:  Hepatitis C Screening: does not qualify;   Vision Screening: Recommended annual ophthalmology exams for early detection of glaucoma and other disorders of the eye. Is the patient up to date with their annual eye exam?  Yes  Who is the provider or what is the name of the office in which the patient attends annual eye exams? [facility] If pt is not established with a provider, would they like to be referred to a provider to establish care? No .   Dental Screening: Recommended annual dental exams for proper oral hygiene  Community Resource Referral / Chronic Care Management: CRR required this visit?  No   CCM required this visit?  No      Plan:     I have personally reviewed and noted the following in the patient's chart:   Medical and social history Use of  alcohol, tobacco or illicit drugs  Current medications and supplements including opioid prescriptions. Patient is not currently taking opioid prescriptions. Functional ability and status Nutritional status Physical activity Advanced directives List of other physicians Hospitalizations, surgeries, and ER visits in previous 12 months Vitals Screenings to include cognitive, depression, and falls Referrals and appointments  In addition, I have reviewed and discussed with patient certain preventive protocols, quality metrics, and best practice recommendations. A written personalized care plan for preventive services as well as general preventive health recommendations were provided to patient.     LDaphane Shepherd LPN   [id number]  Nurse Notes: none

## 2022-06-07 NOTE — Patient Instructions (Signed)
Jasmine Bradford , Thank you for taking time to come for your Medicare Wellness Visit. I appreciate your ongoing commitment to your health goals. Please review the following plan we discussed and let me know if I can assist you in the future.   These are the goals we discussed:  Goals      DIET - INCREASE WATER INTAKE        This is a list of the screening recommended for you and due dates:  Health Maintenance  Topic Date Due   COVID-19 Vaccine (5 - Pfizer series) 03/07/2021   Flu Shot  03/22/2022   Mammogram  02/05/2023   Tetanus Vaccine  02/09/2025   Colon Cancer Screening  10/16/2028   Pneumonia Vaccine  Completed   DEXA scan (bone density measurement)  Completed   Hepatitis C Screening: USPSTF Recommendation to screen - Ages 4-79 yo.  Completed   Zoster (Shingles) Vaccine  Completed   HPV Vaccine  Aged Out    Advanced directives: Please bring a copy of your health care power of attorney and living will to the office to be added to your chart at your convenience.   Conditions/risks identified: Aim for 30 minutes of exercise or brisk walking, 6-8 glasses of water, and 5 servings of fruits and vegetables each day.   Next appointment: Follow up in one year for your annual wellness visit    Preventive Care 65 Years and Older, Female Preventive care refers to lifestyle choices and visits with your health care provider that can promote health and wellness. What does preventive care include? A yearly physical exam. This is also called an annual well check. Dental exams once or twice a year. Routine eye exams. Ask your health care provider how often you should have your eyes checked. Personal lifestyle choices, including: Daily care of your teeth and gums. Regular physical activity. Eating a healthy diet. Avoiding tobacco and drug use. Limiting alcohol use. Practicing safe sex. Taking low-dose aspirin every day. Taking vitamin and mineral supplements as recommended by your  health care provider. What happens during an annual well check? The services and screenings done by your health care provider during your annual well check will depend on your age, overall health, lifestyle risk factors, and family history of disease. Counseling  Your health care provider may ask you questions about your: Alcohol use. Tobacco use. Drug use. Emotional well-being. Home and relationship well-being. Sexual activity. Eating habits. History of falls. Memory and ability to understand (cognition). Work and work Statistician. Reproductive health. Screening  You may have the following tests or measurements: Height, weight, and BMI. Blood pressure. Lipid and cholesterol levels. These may be checked every 5 years, or more frequently if you are over 13 years old. Skin check. Lung cancer screening. You may have this screening every year starting at age 48 if you have a 30-pack-year history of smoking and currently smoke or have quit within the past 15 years. Fecal occult blood test (FOBT) of the stool. You may have this test every year starting at age 26. Flexible sigmoidoscopy or colonoscopy. You may have a sigmoidoscopy every 5 years or a colonoscopy every 10 years starting at age 66. Hepatitis C blood test. Hepatitis B blood test. Sexually transmitted disease (STD) testing. Diabetes screening. This is done by checking your blood sugar (glucose) after you have not eaten for a while (fasting). You may have this done every 1-3 years. Bone density scan. This is done to screen for osteoporosis. You may  have this done starting at age 75. Mammogram. This may be done every 1-2 years. Talk to your health care provider about how often you should have regular mammograms. Talk with your health care provider about your test results, treatment options, and if necessary, the need for more tests. Vaccines  Your health care provider may recommend certain vaccines, such as: Influenza vaccine.  This is recommended every year. Tetanus, diphtheria, and acellular pertussis (Tdap, Td) vaccine. You may need a Td booster every 10 years. Zoster vaccine. You may need this after age 58. Pneumococcal 13-valent conjugate (PCV13) vaccine. One dose is recommended after age 26. Pneumococcal polysaccharide (PPSV23) vaccine. One dose is recommended after age 31. Talk to your health care provider about which screenings and vaccines you need and how often you need them. This information is not intended to replace advice given to you by your health care provider. Make sure you discuss any questions you have with your health care provider. Document Released: 09/04/2015 Document Revised: 04/27/2016 Document Reviewed: 06/09/2015 Elsevier Interactive Patient Education  2017 [employer] Prevention in the Home Falls can cause injuries. They can happen to people of all ages. There are many things you can do to make your home safe and to help prevent falls. What can I do on the outside of my home? Regularly fix the edges of walkways and driveways and fix any cracks. Remove anything that might make you trip as you walk through a door, such as a raised step or threshold. Trim any bushes or trees on the path to your home. Use bright outdoor lighting. Clear any walking paths of anything that might make someone trip, such as rocks or tools. Regularly check to see if handrails are loose or broken. Make sure that both sides of any steps have handrails. Any raised decks and porches should have guardrails on the edges. Have any leaves, snow, or ice cleared regularly. Use sand or salt on walking paths during winter. Clean up any spills in your garage right away. This includes oil or grease spills. What can I do in the bathroom? Use night lights. Install grab bars by the toilet and in the tub and shower. Do not use towel bars as grab bars. Use non-skid mats or decals in the tub or shower. If you need to sit  down in the shower, use a plastic, non-slip stool. Keep the floor dry. Clean up any water that spills on the floor as soon as it happens. Remove soap buildup in the tub or shower regularly. Attach bath mats securely with double-sided non-slip rug tape. Do not have throw rugs and other things on the floor that can make you trip. What can I do in the bedroom? Use night lights. Make sure that you have a light by your bed that is easy to reach. Do not use any sheets or blankets that are too big for your bed. They should not hang down onto the floor. Have a firm chair that has side arms. You can use this for support while you get dressed. Do not have throw rugs and other things on the floor that can make you trip. What can I do in the kitchen? Clean up any spills right away. Avoid walking on wet floors. Keep items that you use a lot in easy-to-reach places. If you need to reach something above you, use a strong step stool that has a grab bar. Keep electrical cords out of the way. Do not use floor polish  or wax that makes floors slippery. If you must use wax, use non-skid floor wax. Do not have throw rugs and other things on the floor that can make you trip. What can I do with my stairs? Do not leave any items on the stairs. Make sure that there are handrails on both sides of the stairs and use them. Fix handrails that are broken or loose. Make sure that handrails are as long as the stairways. Check any carpeting to make sure that it is firmly attached to the stairs. Fix any carpet that is loose or worn. Avoid having throw rugs at the top or bottom of the stairs. If you do have throw rugs, attach them to the floor with carpet tape. Make sure that you have a light switch at the top of the stairs and the bottom of the stairs. If you do not have them, ask someone to add them for you. What else can I do to help prevent falls? Wear shoes that: Do not have high heels. Have rubber bottoms. Are  comfortable and fit you well. Are closed at the toe. Do not wear sandals. If you use a stepladder: Make sure that it is fully opened. Do not climb a closed stepladder. Make sure that both sides of the stepladder are locked into place. Ask someone to hold it for you, if possible. Clearly mark and make sure that you can see: Any grab bars or handrails. First and last steps. Where the edge of each step is. Use tools that help you move around (mobility aids) if they are needed. These include: Canes. Walkers. Scooters. Crutches. Turn on the lights when you go into a dark area. Replace any light bulbs as soon as they burn out. Set up your furniture so you have a clear path. Avoid moving your furniture around. If any of your floors are uneven, fix them. If there are any pets around you, be aware of where they are. Review your medicines with your doctor. Some medicines can make you feel dizzy. This can increase your chance of falling. Ask your doctor what other things that you can do to help prevent falls. This information is not intended to replace advice given to you by your health care provider. Make sure you discuss any questions you have with your health care provider. Document Released: 06/04/2009 Document Revised: 01/14/2016 Document Reviewed: 09/12/2014 Elsevier Interactive Patient Education  2017 [employer].

## 2022-06-20 ENCOUNTER — Ambulatory Visit (HOSPITAL_COMMUNITY)
Admission: RE | Admit: 2022-06-20 | Discharge: 2022-06-20 | Disposition: A | Discharge status: Home / Self Care | Payer: Medicare HMO | Source: Ambulatory Visit | Attending: Gastroenterology | Admitting: Gastroenterology

## 2022-06-20 ENCOUNTER — Other Ambulatory Visit: Payer: Self-pay | Admitting: Gastroenterology

## 2022-06-20 DIAGNOSIS — R932 Abnormal findings on diagnostic imaging of liver and biliary tract: Secondary | ICD-10-CM | POA: Diagnosis not present

## 2022-06-20 DIAGNOSIS — K8050 Calculus of bile duct without cholangitis or cholecystitis without obstruction: Secondary | ICD-10-CM | POA: Insufficient documentation

## 2022-06-20 DIAGNOSIS — Z9049 Acquired absence of other specified parts of digestive tract: Secondary | ICD-10-CM | POA: Diagnosis not present

## 2022-06-20 DIAGNOSIS — R935 Abnormal findings on diagnostic imaging of other abdominal regions, including retroperitoneum: Secondary | ICD-10-CM | POA: Diagnosis not present

## 2022-06-20 DIAGNOSIS — K5730 Diverticulosis of large intestine without perforation or abscess without bleeding: Secondary | ICD-10-CM | POA: Diagnosis not present

## 2022-06-20 MED ORDER — GADOBUTROL 1 MMOL/ML IV SOLN
5.5000 mL | Freq: Once | INTRAVENOUS | Status: AC | PRN
  Administered 2022-06-20: 5.5 mL via INTRAVENOUS

## 2022-08-17 ENCOUNTER — Encounter: Payer: Self-pay | Admitting: Gastroenterology

## 2022-08-23 ENCOUNTER — Encounter: Payer: Self-pay | Admitting: Nurse Practitioner

## 2022-08-25 DIAGNOSIS — M25561 Pain in right knee: Secondary | ICD-10-CM | POA: Diagnosis not present

## 2022-09-05 ENCOUNTER — Telehealth: Payer: Self-pay

## 2022-09-05 NOTE — Patient Outreach (Signed)
  Care Coordination   Initial Visit Note   09/05/2022 Name: Jasmine Bradford MRN: [id number] DOB: 1956-02-19  Jasmine Bradford is a 67 y.o. year old female who sees Nche, Charlene Brooke, NP for primary care. I spoke with  [name] by phone today.  What matters to the patients health and wellness today?  none    Goals Addressed             This Visit's Progress    COMPLETED: Care Coordination Activities-No follow up required       Care Coordination Interventions: Advised patient to Annual Wellness exam. Discussed [facility] services and support. Assessed SDOH. Advised to discuss with primary care physician if services needed in the future.        SDOH assessments and interventions completed:  Yes  SDOH Interventions Today    Flowsheet Row Most Recent Value  SDOH Interventions   Housing Interventions Intervention Not Indicated  Transportation Interventions Intervention Not Indicated        Care Coordination Interventions:  Yes, provided   Follow up plan: No further intervention required.   Encounter Outcome:  Pt. Visit Completed   Jone Baseman, RN, MSN [place] [facility] Care Management Coordinator Direct Line [phone]

## 2022-09-05 NOTE — Patient Instructions (Signed)
Visit Information  Thank you for taking time to visit with me today. Please don't hesitate to contact me if I can be of assistance to you.   Following are the goals we discussed today:   Goals Addressed             This Visit's Progress    COMPLETED: Care Coordination Activities-No follow up required       Care Coordination Interventions: Advised patient to Annual Wellness exam. Discussed [facility] services and support. Assessed SDOH. Advised to discuss with primary care physician if services needed in the future.        If you are experiencing a Mental Health or [place] or need someone to talk to, please call the Suicide and Crisis Lifeline: 988   Patient verbalizes understanding of instructions and care plan provided today and agrees to view in [place]. Active MyChart status and patient understanding of how to access instructions and care plan via MyChart confirmed with patient.     No further follow up required:    Jone Baseman, RN, MSN [place] [facility] Care Management Coordinator Direct Line [phone]

## 2022-09-14 ENCOUNTER — Telehealth: Payer: Self-pay | Admitting: Gastroenterology

## 2022-09-14 NOTE — Telephone Encounter (Signed)
Patient is calling states she does not feel the need to schedule her F/U recall states she feels fine and she has nothing to talk about.

## 2022-09-14 NOTE — Telephone Encounter (Signed)
Spoke with Jasmine Bradford. Jasmine Bradford states she feels fine and didn't know why she needed to follow up. Let Jasmine Bradford know that at last office appt Dr. Bryan Lemma wanted to see her back in 6-12 months and told her we can push it out to 12 months. Edited recall for Jasmine Bradford to follow up in June. Jasmine Bradford verbalized understanding and had no other concerns at end of call.

## 2022-09-19 ENCOUNTER — Other Ambulatory Visit: Payer: Self-pay

## 2022-09-19 DIAGNOSIS — R109 Unspecified abdominal pain: Secondary | ICD-10-CM

## 2022-09-19 NOTE — Telephone Encounter (Signed)
Spoke with pt. Pt states she woke up with shooting pain in her left lower abdomen. Walking around pt has a dull pain in left lower abd. Pt reports the pain feels similar to when she had stones in her bile duct. Scheduled pt for appt with Ellouise Newer, PA tomorrow at 1:30 pm. Pt wanted to make sure Dr. Bryan Lemma was aware as well.

## 2022-09-19 NOTE — Telephone Encounter (Signed)
Inbound call from patient stating that she believes she jinxed herself  because now she is starting to feel like she has a problem. Patient stated she has had sharp abd pain and is requesting a call ASAP because she took off of work today because of the pain. Please advise.

## 2022-09-19 NOTE — Telephone Encounter (Signed)
Order placed for liver enzymes. Gave pt Dr. Vivia Ewing recommendations. Pt stated she wouldn't be able to come for lab until tomorrow.

## 2022-09-20 ENCOUNTER — Encounter: Payer: Self-pay | Admitting: Physician Assistant

## 2022-09-20 ENCOUNTER — Ambulatory Visit: Payer: Medicare HMO | Admitting: Physician Assistant

## 2022-09-20 ENCOUNTER — Other Ambulatory Visit (INDEPENDENT_AMBULATORY_CARE_PROVIDER_SITE_OTHER): Payer: Medicare HMO

## 2022-09-20 VITALS — BP 108/60 | HR 79 | Ht 61.0 in | Wt 129.0 lb

## 2022-09-20 DIAGNOSIS — R109 Unspecified abdominal pain: Secondary | ICD-10-CM | POA: Diagnosis not present

## 2022-09-20 DIAGNOSIS — Z8719 Personal history of other diseases of the digestive system: Secondary | ICD-10-CM

## 2022-09-20 DIAGNOSIS — R1032 Left lower quadrant pain: Secondary | ICD-10-CM

## 2022-09-20 LAB — CBC WITH DIFFERENTIAL/PLATELET
Basophils Absolute: 0.1 10*3/uL (ref 0.0–0.1)
Basophils Relative: 0.9 % (ref 0.0–3.0)
Eosinophils Absolute: 0.2 10*3/uL (ref 0.0–0.7)
Eosinophils Relative: 2.6 % (ref 0.0–5.0)
HCT: 38 % (ref 36.0–46.0)
Hemoglobin: 12.9 g/dL (ref 12.0–15.0)
Lymphocytes Relative: 26.9 % (ref 12.0–46.0)
Lymphs Abs: 1.8 10*3/uL (ref 0.7–4.0)
MCHC: 34 g/dL (ref 30.0–36.0)
MCV: 85 fl (ref 78.0–100.0)
Monocytes Absolute: 0.4 10*3/uL (ref 0.1–1.0)
Monocytes Relative: 5.6 % (ref 3.0–12.0)
Neutro Abs: 4.2 10*3/uL (ref 1.4–7.7)
Neutrophils Relative %: 64 % (ref 43.0–77.0)
Platelets: 256 10*3/uL (ref 150.0–400.0)
RBC: 4.48 Mil/uL (ref 3.87–5.11)
RDW: 13.4 % (ref 11.5–15.5)
WBC: 6.6 10*3/uL (ref 4.0–10.5)

## 2022-09-20 LAB — HEPATIC FUNCTION PANEL
ALT: 18 U/L (ref 0–35)
AST: 18 U/L (ref 0–37)
Albumin: 4.2 g/dL (ref 3.5–5.2)
Alkaline Phosphatase: 65 U/L (ref 39–117)
Bilirubin, Direct: 0.1 mg/dL (ref 0.0–0.3)
Total Bilirubin: 0.4 mg/dL (ref 0.2–1.2)
Total Protein: 6.9 g/dL (ref 6.0–8.3)

## 2022-09-20 LAB — BASIC METABOLIC PANEL
BUN: 13 mg/dL (ref 6–23)
CO2: 29 mEq/L (ref 19–32)
Calcium: 9 mg/dL (ref 8.4–10.5)
Chloride: 102 mEq/L (ref 96–112)
Creatinine, Ser: 0.77 mg/dL (ref 0.40–1.20)
GFR: 79.24 mL/min (ref >60.00)
Glucose, Bld: 106 mg/dL — ABNORMAL HIGH (ref 70–99)
Potassium: 4.3 mEq/L (ref 3.5–5.1)
Sodium: 140 mEq/L (ref 135–145)

## 2022-09-20 NOTE — Progress Notes (Signed)
Chief Complaint: Left-sided abdominal pain  HPI:    Jasmine Bradford is a 67 y/o female, known to Dr. Bryan Lemma, with a past medical history of cholelithiasis with choledocholithiasis, diverticulitis and GERD, who presents to clinic today with a complaint of left-sided abdominal pain.    01/25/2022 patient seen in clinic for GERD and history of choledocholithiasis.  At that time reflux well-controlled on 40 mg of Prilosec daily.  Discussed history of choledocholithiasis with 2 prior ERCPs.  As well as a repeat 10/15/2021.  Discussed need for repeat colonoscopy in 2023 for ongoing polyp surveillance.    06/20/2022 MRCP with and without contrast showed near complete resolution of very Dr. Dilation since prior study, probable flow artifact in the mid common bile duct with common duct calculus considered less likely, left-sided colonic diverticulosis without evidence of diverticulitis.   09/19/2022 patient called and described that she was having a problem with sharp abdominal pain.  Describes shooting pain in her left lower abdomen similar to stones in her bile duct.  Dr. Bryan Lemma recommended liver enzymes.  Discussed if they were elevated then could continue MRCP.    09/20/2022 hepatic function panel was normal.    Today, patient tells me she knows something "is not right".  On Friday she rolled over in bed and felt like something "tore" in her left lower quadrant, she woke up on Saturday and felt absolutely horrible with nausea and abdominal pain, things have calmed down a little bit from there but she still gets some epigastric pain and has been using Pepcid more so as well as some nausea and feels discomfort in her left lower quadrant unchanged by bowel movement that feels worse when she is walking around.  Tells me she thinks she has a stone or something is going on like "that time I had an infection on the left side".  She had a regular bowel movement this morning.    Denies fever, chills, weight loss or  blood in her stool.  Endoscopic history: -ERCP (02/2016, Dr. Watt Climes, [place]): Choledocholithiasis, removed with sphincterotomy and balloon sweeps -ERCP (05/2016, Dr. Watt Climes): Choledocholithiasis, removed with further sphincterotomy and balloon sweeps with removal of stones and sludge -EGD (05/2018, Dr. Watt Climes): Normal -ERCP (05/2018, Dr. Watt Climes): No stones on cholangiogram.  Balloon sweeps (nothing removed) and dilation of distal CBD/ampulla with 4 cm x 8 mm balloon dilator to facilitate improved drainage -Colonoscopy (09/2018, Dr. Watt Climes): External/internal hemorrhoids, sigmoid diverticulosis, 7 polyps resected (no path review), normal TI.  Recommended repeat in 3 years -ERCP (01/2020, Dr. Lyndel Safe): Removal of multiple stones and pus.  Normal post occlusion cholangiogram - ERCP (09/2021, Dr. Lyndel Safe): Dilated biliary tree with "sigmoid" appearing CBD measuring 15 mm.  Multiple filling defects c/w choledocholithiasis.  Sphincteroplasty of the CBD with 13.5 mm balloon then biliary tree swept with removal of several small stones and sludge.  Largest stone could not be removed with 15 mm balloon.  Spy scope inserted.  No stricture noted.  Large stone noted in EHL performed with fracturing.  CBD then swept with 15 mm balloon several times with extraction of significant sludge.  Placed 10 French by 7 cm plastic stent into CBD.  - ERCP (12/09/2021, Dr. Rush Landmark): Severely dilated bile duct up to 18 mm significant angulation of the lower third of CBD without overt stricture, multiple filling defects in upper CBD removed with balloon sweeps and sphincteroplasty.  1 cm stone remained removed with lithotripsy and repeat balloon sweeps.  Spyglass performed with removal of additional retained  stones.  No stones remained on occlusion cholangiogram.  Brushings performed for cytology (cytology benign).  Remains at risk for recurrent stones   Past Medical History:  Diagnosis Date   Allergy    Cholelithiasis with choledocholithiasis     Diverticulitis    GERD (gastroesophageal reflux disease)    Tobacco abuse    Varicose vein of leg    no problems now   Wears glasses     Past Surgical History:  Procedure Laterality Date   BILIARY BRUSHING  12/09/2021   Procedure: BILIARY BRUSHING;  Surgeon: Irving Copas., MD;  Location: [place];  Service: Gastroenterology;;   BILIARY DILATION  02/19/2020   Procedure: BILIARY DILATION;  Surgeon: Jackquline Denmark, MD;  Location: [facility] ENDOSCOPY;  Service: Endoscopy;;   BILIARY DILATION  10/15/2021   Procedure: BILIARY DILATION;  Surgeon: Jackquline Denmark, MD;  Location: [place];  Service: Gastroenterology;;   BILIARY DILATION  12/09/2021   Procedure: BILIARY DILATION;  Surgeon: Irving Copas., MD;  Location: [facility] ENDOSCOPY;  Service: Gastroenterology;;   BILIARY STENT PLACEMENT  10/15/2021   Procedure: BILIARY STENT PLACEMENT;  Surgeon: Jackquline Denmark, MD;  Location: [facility] ENDOSCOPY;  Service: Gastroenterology;;   CESAREAN SECTION     CHOLECYSTECTOMY N/A 05/23/2016   Procedure: LAPAROSCOPIC CHOLECYSTECTOMY WITH INTRAOPERATIVE CHOLANGIOGRAM;  Surgeon: Jackolyn Confer, MD;  Location: [place];  Service: General;  Laterality: N/A;   ENDOSCOPIC RETROGRADE CHOLANGIOPANCREATOGRAPHY (ERCP) WITH PROPOFOL N/A 06/06/2016   Procedure: ENDOSCOPIC RETROGRADE CHOLANGIOPANCREATOGRAPHY (ERCP) WITH PROPOFOL;  Surgeon: Clarene Essex, MD;  Location: [facility] ENDOSCOPY;  Service: Endoscopy;  Laterality: N/A;   ENDOSCOPIC RETROGRADE CHOLANGIOPANCREATOGRAPHY (ERCP) WITH PROPOFOL N/A 02/19/2020   Procedure: ENDOSCOPIC RETROGRADE CHOLANGIOPANCREATOGRAPHY (ERCP) WITH PROPOFOL;  Surgeon: Jackquline Denmark, MD;  Location: [facility] ENDOSCOPY;  Service: Endoscopy;  Laterality: N/A;   ENDOSCOPIC RETROGRADE CHOLANGIOPANCREATOGRAPHY (ERCP) WITH PROPOFOL N/A 10/15/2021   Procedure: ENDOSCOPIC RETROGRADE CHOLANGIOPANCREATOGRAPHY (ERCP) WITH PROPOFOL;  Surgeon: Jackquline Denmark, MD;  Location: [facility] ENDOSCOPY;  Service:  Gastroenterology;  Laterality: N/A;   ENDOSCOPIC RETROGRADE CHOLANGIOPANCREATOGRAPHY (ERCP) WITH PROPOFOL N/A 12/09/2021   Procedure: ENDOSCOPIC RETROGRADE CHOLANGIOPANCREATOGRAPHY (ERCP) WITH PROPOFOL;  Surgeon: Rush Landmark Telford Nab., MD;  Location: [place];  Service: Gastroenterology;  Laterality: N/A;   ERCP N/A 03/09/2016   Procedure: ENDOSCOPIC RETROGRADE CHOLANGIOPANCREATOGRAPHY (ERCP);  Surgeon: Clarene Essex, MD;  Location: [facility] END[facility];  Service: Endoscopy;  Laterality: N/A;   ERCP N/A 06/12/2018   Procedure: ENDOSCOPIC RETROGRADE CHOLANGIOPANCREATOGRAPHY (ERCP);  Surgeon: Clarene Essex, MD;  Location: [facility] ENDOSCOPY;  Service: Endoscopy;  Laterality: N/A;   ESOPHAGOGASTRODUODENOSCOPY N/A 06/12/2018   Procedure: ESOPHAGOGASTRODUODENOSCOPY (EGD);  Surgeon: Clarene Essex, MD;  Location: [facility] ENDOSCOPY;  Service: Endoscopy;  Laterality: N/A;   laser vein surgery     last year   LITHOTRIPSY  12/09/2021   Procedure: LITHOTRIPSY;  Surgeon: Mansouraty, Telford Nab., MD;  Location: [place];  Service: Gastroenterology;;   REMOVAL OF STONES  06/12/2018   Procedure: REMOVAL OF STONES;  Surgeon: Clarene Essex, MD;  Location: [facility] ENDOSCOPY;  Service: Endoscopy;;   REMOVAL OF STONES  02/19/2020   Procedure: REMOVAL OF STONES;  Surgeon: Jackquline Denmark, MD;  Location: [facility] ENDOSCOPY;  Service: Endoscopy;;   REMOVAL OF STONES  10/15/2021   Procedure: REMOVAL OF STONES;  Surgeon: Jackquline Denmark, MD;  Location: [place];  Service: Gastroenterology;;   REMOVAL OF STONES  12/09/2021   Procedure: REMOVAL OF STONES;  Surgeon: Irving Copas., MD;  Location: [place];  Service: Gastroenterology;;   ROBOTIC ASSISTED SALPINGO OOPHERECTOMY Bilateral 12/21/2021   Procedure: Mechele Claude  ROBOTIC ASSISTED SALPINGO OOPHORECTOMY;  Surgeon: [name], MD;  Location: [facility];  Service: Gynecology;  Laterality: Bilateral;   SPHINCTEROTOMY  06/12/2018   Procedure: SPHINCTEROTOMY;  Surgeon: Clarene Essex, MD;   Location: [facility] ENDOSCOPY;  Service: Endoscopy;;   SPHINCTEROTOMY  02/19/2020   Procedure: Joan Mayans;  Surgeon: Jackquline Denmark, MD;  Location: [facility] ENDOSCOPY;  Service: Endoscopy;;   SPYGLASS CHOLANGIOSCOPY N/A 10/15/2021   Procedure: KCLEXNTZ CHOLANGIOSCOPY;  Surgeon: Jackquline Denmark, MD;  Location: [place];  Service: Gastroenterology;  Laterality: N/A;   SPYGLASS CHOLANGIOSCOPY N/A 12/09/2021   Procedure: SPYGLASS CHOLANGIOSCOPY;  Surgeon: Irving Copas., MD;  Location: [place];  Service: Gastroenterology;  Laterality: N/A;   SPYGLASS LITHOTRIPSY N/A 10/15/2021   Procedure: GYFVCBSW LITHOTRIPSY;  Surgeon: Jackquline Denmark, MD;  Location: [facility] ENDOSCOPY;  Service: Gastroenterology;  Laterality: N/A;   STENT REMOVAL  12/09/2021   Procedure: STENT REMOVAL;  Surgeon: Irving Copas., MD;  Location: [place];  Service: Gastroenterology;;   TUBAL LIGATION     at time of c-section   VARICOSE VEIN SURGERY Left     Current Outpatient Medications  Medication Sig Dispense Refill   acetaminophen (TYLENOL) 500 MG tablet Take 500 mg by mouth every 6 (six) hours as needed for mild pain or moderate pain.     Calcium Carbonate-Vit D-Min (CALCIUM 1200 PO) Take 1 tablet by mouth daily.     Carboxymethylcellulose Sodium (THERATEARS) 0.25 % SOLN Place 1 drop into both eyes as needed (eye irritation).     cetirizine (ZYRTEC) 10 MG tablet Take 10 mg by mouth daily as needed for allergies.     famotidine (PEPCID) 20 MG tablet TAKE 1 TABLET BY MOUTH EVERYDAY AT BEDTIME 90 tablet 1   meloxicam (MOBIC) 15 MG tablet Take 15 mg by mouth daily.     Multiple Vitamin (MULTIVITAMIN WITH MINERALS) TABS tablet Take 1 tablet by mouth daily with lunch.     omeprazole (PRILOSEC) 40 MG capsule TAKE 1 CAPSULE BY MOUTH EVERY DAY 90 capsule 2   Probiotic Product (PROBIOTIC PO) Take 1 tablet by mouth daily.     ursodiol (ACTIGALL) 250 MG tablet Take 1 tablet (250 mg total) by mouth 2 (two) times daily. 180  tablet 1   No current facility-administered medications for this visit.    Allergies as of 09/20/2022 - Review Complete 09/05/2022  Allergen Reaction Noted   Oxycodone Nausea And Vomiting and Other (See Comments) 06/04/2016   Potassium Itching and Rash 02/23/2020   Tape Itching, Swelling, and Rash 10/21/2021    Family History  Problem Relation Age of Onset   Memory loss Mother    Ovarian cancer Mother    Colon cancer Neg Hx    Breast cancer Neg Hx    Endometrial cancer Neg Hx    Pancreatic cancer Neg Hx    Prostate cancer Neg Hx     Social History   Socioeconomic History   Marital status: Widowed    Spouse name: Nathaneil Canary   Number of children: 4   Years of education: Not on file   Highest education level: Not on file  Occupational History   Not on file  Tobacco Use   Smoking status: Former    Packs/day: 0.50    Years: 35.00    Total pack years: 17.50    Types: Cigarettes   Smokeless tobacco: Never   Tobacco comments:    No smoking since 02/2016  Vaping Use   Vaping Use: Never used  Substance and Sexual  Activity   Alcohol use: No   Drug use: Never   Sexual activity: Not Currently    Partners: Male    Birth control/protection: Post-menopausal, Surgical    Comment: BTL  Other Topics Concern   Not on file  Social History Narrative   Not on file   Social Determinants of Health   Financial Resource Strain: Low Risk  (06/07/2022)   Overall Financial Resource Strain (CARDIA)    Difficulty of Paying Living Expenses: Not hard at all  Food Insecurity: No Food Insecurity (06/07/2022)   Hunger Vital Sign    Worried About Running Out of Food in the Last Year: Never true    Ran Out of Food in the Last Year: Never true  Transportation Needs: No Transportation Needs (09/05/2022)   PRAPARE - Hydrologist (Medical): No    Lack of Transportation (Non-Medical): No  Physical Activity: Sufficiently Active (06/07/2022)   Exercise Vital Sign     Days of Exercise per Week: 5 days    Minutes of Exercise per Session: 60 min  Stress: No Stress Concern Present (06/07/2022)   [place]    Feeling of Stress : Not at all  Social Connections: Socially Isolated (06/07/2022)   Social Connection and Isolation Panel [NHANES]    Frequency of Communication with Friends and Family: More than three times a week    Frequency of Social Gatherings with Friends and Family: More than three times a week    Attends Religious Services: Never    Marine scientist or Organizations: No    Attends Archivist Meetings: Never    Marital Status: Widowed  Intimate Partner Violence: Not At Risk (06/07/2022)   Humiliation, Afraid, Rape, and Kick questionnaire    Fear of Current or Ex-Partner: No    Emotionally Abused: No    Physically Abused: No    Sexually Abused: No    Review of Systems:    Constitutional: No weight loss, fever or chills  Cardiovascular: No chest pain Respiratory: No SOB  Gastrointestinal: See HPI and otherwise negative   Physical Exam:  Vital signs: BP 108/60   Pulse 79   Ht '5\' 1"'$  (1.549 m)   Wt 129 lb (58.5 kg)   BMI 24.37 kg/m    Constitutional:   Pleasant Caucasian female appears to be in NAD, Well developed, Well nourished, alert and cooperative Respiratory: Respirations even and unlabored. Lungs clear to auscultation bilaterally.   No wheezes, crackles, or rhonchi.  Cardiovascular: Normal S1, S2. No MRG. Regular rate and rhythm. No peripheral edema, cyanosis or pallor.  Gastrointestinal:  Soft, nondistended, mild-mod LLQ ttp. No rebound or guarding. Normal bowel sounds. No appreciable masses or hepatomegaly. Rectal:  Not performed.  Psychiatric: Demonstrates good judgement and reason without abnormal affect or behaviors.  RELEVANT LABS AND IMAGING: CBC    Component Value Date/Time   WBC 5.1 12/08/2021 0809   RBC 4.57 12/08/2021 0809   HGB  13.0 12/08/2021 0809   HCT 39.9 12/08/2021 0809   PLT 235 12/08/2021 0809   MCV 87.3 12/08/2021 0809   MCH 28.4 12/08/2021 0809   MCHC 32.6 12/08/2021 0809   RDW 13.2 12/08/2021 0809   LYMPHSABS 1.8 02/23/2020 1030   MONOABS 0.4 02/23/2020 1030   EOSABS 0.1 02/23/2020 1030   BASOSABS 0.1 02/23/2020 1030    CMP     Component Value Date/Time   NA 139 12/08/2021 0809  K 3.9 12/08/2021 0809   CL 106 12/08/2021 0809   CO2 28 12/08/2021 0809   GLUCOSE 90 12/08/2021 0809   BUN 15 12/08/2021 0809   CREATININE 0.85 12/08/2021 0809   CREATININE 0.86 08/20/2021 1634   CALCIUM 8.9 12/08/2021 0809   PROT 6.9 09/20/2022 1228   ALBUMIN 4.2 09/20/2022 1228   AST 18 09/20/2022 1228   ALT 18 09/20/2022 1228   ALKPHOS 65 09/20/2022 1228   BILITOT 0.4 09/20/2022 1228   GFRNONAA >60 12/08/2021 0809   GFRAA >60 02/25/2020 0407    Assessment: 1.  Left lower quadrant pain: Over the past 4 days now, severe over the weekend and slightly better now, history of diverticulitis; concern for diverticulitis 2.  History of choledocholithiasis: Normal LFTs, pain is more in the left lower quadrant as above  Plan: 1.  Ordered extra labs today including CBC and BMP. 2.  Patient wants me to order a CT to really find out what is going on.  Ordered stat CT of the abdomen pelvis with contrast for further evaluation of suspected diverticulitis.  Did offer the patient to treat empirically with antibiotics but she wants to get a CT.  We are trying to get this scheduled within the next 48 hours. 3.  Patient to follow in clinic per recommendations after imaging as above.  Did discuss I do not think she has choledocholithiasis at this point given where her pain is and her symptoms.  Also liver enzymes normal.  Ellouise Newer, PA-C [facility] 09/20/2022, 1:36 PM  Cc: Flossie Buffy, NP

## 2022-09-20 NOTE — Patient Instructions (Addendum)
Your provider has requested that you go to the basement level for lab work before leaving today. Press "B" on the elevator. The lab is located at the first door on the left as you exit the elevator.   You have been scheduled for a CT scan of the abdomen and pelvis at [facility], [place], [place] [address] Phone: [phone], 1st floor Radiology. You are scheduled on 09/21/22 at 12:30 pm. You should arrive at 10:15 am prior to your appointment time for registration. Nothing by mouth 4 hours prior.   You may take any medications as prescribed with a small amount of water, if necessary. If you take any of the following medications: METFORMIN, GLUCOPHAGE, GLUCOVANCE, AVANDAMET, RIOMET, FORTAMET, [place] MET, JANUMET, GLUMETZA or METAGLIP, you MAY be asked to HOLD this medication 48 hours AFTER the exam.   The purpose of you drinking the oral contrast is to aid in the visualization of your intestinal tract. The contrast solution may cause some diarrhea. Depending on your individual set of symptoms, you may also receive an intravenous injection of x-ray contrast/dye. Plan on being at [facility] for 45 minutes or longer, depending on the type of exam you are having performed.   If you have any questions regarding your exam or if you need to reschedule, you may call [facility] at [phone] between the hours of 8:00 am and 5:00 pm, Monday-Friday.    If your blood pressure at your visit was 140/90 or greater, please contact your primary care physician to follow up on this.  _______________________________________________________  If you are age 81 or older, your body mass index should be between 23-30. Your Body mass index is 24.37 kg/m. If this is out of the aforementioned range listed, please consider follow up with your Primary Care Provider.  If you are age 64 or younger, your body mass index should be between 19-25. Your Body mass index is 24.37 kg/m. If this is out of  the aformentioned range listed, please consider follow up with your Primary Care Provider.    The [place] [facility] providers would like to encourage you to use [facility] to communicate with providers for non-urgent requests or questions.  Due to long hold times on the telephone, sending your provider a message by [facility] may be a faster and more efficient way to get a response.  Please allow 48 business hours for a response.  Please remember that this is for non-urgent requests.   Due to recent changes in healthcare laws, you may see the results of your imaging and laboratory studies on MyChart before your provider has had a chance to review them.  We understand that in some cases there may be results that are confusing or concerning to you. Not all laboratory results come back in the same time frame and the provider may be waiting for multiple results in order to interpret others.  Please give [place] 48 hours in order for your provider to thoroughly review all the results before contacting the office for clarification of your results.    Thank you for entrusting me with your care and choosing [facility].  Ellouise Newer PA-C

## 2022-09-20 NOTE — Progress Notes (Signed)
Agree with the assessment and plan as outlined by Jennifer Lemmon, PA-C. ? ?Langdon Crosson, DO, FACG ? ?

## 2022-09-21 ENCOUNTER — Telehealth: Payer: Self-pay

## 2022-09-21 ENCOUNTER — Ambulatory Visit (HOSPITAL_BASED_OUTPATIENT_CLINIC_OR_DEPARTMENT_OTHER)
Admission: RE | Admit: 2022-09-21 | Discharge: 2022-09-21 | Disposition: A | Discharge status: Home / Self Care | Payer: Medicare HMO | Source: Ambulatory Visit | Attending: Physician Assistant | Admitting: Physician Assistant

## 2022-09-21 DIAGNOSIS — R1032 Left lower quadrant pain: Secondary | ICD-10-CM

## 2022-09-21 DIAGNOSIS — Z8719 Personal history of other diseases of the digestive system: Secondary | ICD-10-CM | POA: Diagnosis not present

## 2022-09-21 DIAGNOSIS — K5730 Diverticulosis of large intestine without perforation or abscess without bleeding: Secondary | ICD-10-CM | POA: Diagnosis not present

## 2022-09-21 MED ORDER — DICYCLOMINE HCL 10 MG PO CAPS: ORAL_CAPSULE | ORAL | 2 refills | Status: DC

## 2022-09-21 MED ORDER — IOHEXOL 300 MG/ML  SOLN
100.0000 mL | Freq: Once | INTRAMUSCULAR | Status: AC | PRN
  Administered 2022-09-21: 80 mL via INTRAVENOUS

## 2022-09-21 NOTE — Telephone Encounter (Signed)
Called and spoke with patient regarding her CT results and recommendations. Pt would like RX for Dicyclomine sent to [facility] pharmacy on file. Pt has been scheduled for a follow up appt with Dr. Bryan Lemma Thursday, 10/20/22 at 8:40 am. Pt verbalized understanding and had no concerns at the end of the call.

## 2022-09-21 NOTE — Telephone Encounter (Signed)
Received a call from [facility] at [facility] regarding CT results from today.  IMPRESSION: 1. Sigmoid diverticulosis without definite findings of diverticulitis. 2. Pneumobilia, likely related to history of sphincterotomy. 3.  Aortic Atherosclerosis (ICD10-I70.0).

## 2022-09-21 NOTE — Addendum Note (Signed)
Addended by: Yevette Edwards on: 09/21/2022 02:35 PM   Modules accepted: Orders

## 2022-09-23 ENCOUNTER — Telehealth: Payer: Self-pay

## 2022-09-23 NOTE — Telephone Encounter (Addendum)
-----   Message from Levin Erp, [place] sent at 09/23/2022 11:27 AM EST ----- Regarding: FW: ct Can you call and check on patient to see if she thinks Dicyclomine helping at all.  Thanks-JLL ----- Message ----- From: Lavena Bullion, DO Sent: 09/22/2022   1:28 PM EST To: Levin Erp, PA Subject: RE: ct                                         While she certainly has a few good reasons for acute/recurrent GI and hepatobiliary pathology, I am glad to see that her CT was unremarkable from an acute pathology standpoint.  I saw her labs the other day which looked normal, including normal enzymes.  Agree with trialing antispasmodic and monitoring.  ----- Message ----- From: Levin Erp, PA Sent: 09/21/2022   1:35 PM EST To: Lavena Bullion, DO Subject: ct                                             I saw this patient in clinic she was pretty convinced she had something going on but it does not look like she has diverticulitis and her liver enzymes were all normal.  I am likely going to offer her an antispasmodic and see if that helps at all with the pain she is having.  When I have her follow-up with you unless you have any other suggestions.  Thanks, JL L ----- Message ----- From: Interface, Rad Results In Sent: 09/21/2022  12:02 PM EST To: Levin Erp, PA

## 2022-09-26 MED ORDER — ONDANSETRON HCL 4 MG PO TABS: ORAL_TABLET | ORAL | 0 refills | Status: DC

## 2022-09-26 NOTE — Telephone Encounter (Signed)
Called and spoke with patient. Pt would like RX sent to pharmacy on file. Pt has been advised to try this in the interim. Pt verbalized understanding and had no concerns at the end of the call.

## 2022-09-26 NOTE — Addendum Note (Signed)
Addended by: Yevette Edwards on: 09/26/2022 09:23 AM   Modules accepted: Orders

## 2022-09-26 NOTE — Telephone Encounter (Signed)
Called and spoke with patient. She states that she has not had to use the Dicyclomine since she has not had any more abdominal spasms. Pt does report that she has been constantly nauseous, she tried taking Pepcid BID and it still does not help. Pt has an upcoming appt with Dr. Bryan Lemma to discuss further. Pt had no concerns at the end of the call.

## 2022-10-10 ENCOUNTER — Other Ambulatory Visit: Payer: Self-pay | Admitting: Gastroenterology

## 2022-10-14 ENCOUNTER — Telehealth: Payer: Self-pay

## 2022-10-14 NOTE — Patient Instructions (Signed)
Visit Information  Thank you for taking time to visit with me today. Please don't hesitate to contact me if I can be of assistance to you.   Following are the goals we discussed today:   Goals Addressed             This Visit's Progress    COMPLETED: Care Coordination Activities-No follow up required       Interventions Today    Flowsheet Row Most Recent Value  Chronic Disease   Chronic disease during today's visit Other  [hyperlipidemia]  General Interventions   General Interventions Discussed/Reviewed General Interventions Discussed  Education Interventions   Education Provided Provided Education  Provided Verbal Education On Medication  [Statin]  Nutrition Interventions   Nutrition Discussed/Reviewed Decreasing fats  Pharmacy Interventions   Pharmacy Dicussed/Reviewed Medications and their functions      Spoke with patient. She is not on any statins but is interested in a statin that does not bother her stomach.  She is aware of her cholesterol and triglycerides being up.           If you are experiencing a Mental Health or [place] or need someone to talk to, please call the Suicide and Crisis Lifeline: 988   Patient verbalizes understanding of instructions and care plan provided today and agrees to view in [place]. Active MyChart status and patient understanding of how to access instructions and care plan via MyChart confirmed with patient.     No further follow up required: decline  Jone Baseman, RN, MSN [place] [facility] Care Management Coordinator Direct Line [phone]

## 2022-10-14 NOTE — Patient Outreach (Signed)
  Care Coordination   Initial Visit Note   10/14/2022 Name: Jasmine Bradford MRN: [id number] DOB: February 18, 1956  Jasmine Bradford is a 67 y.o. year old female who sees Jasmine Bradford, Jasmine Brooke, NP for primary care. I spoke with  Jasmine Bradford by phone today.  What matters to the patients health and wellness today?  Cholesterol and medication adherence    Goals Addressed             This Visit's Progress    COMPLETED: Care Coordination Activities-No follow up required       Interventions Today    Flowsheet Row Most Recent Value  Chronic Disease   Chronic disease during today's visit Other  [hyperlipidemia]  General Interventions   General Interventions Discussed/Reviewed General Interventions Discussed  Education Interventions   Education Provided Provided Education  Provided Verbal Education On Medication  [Statin]  Nutrition Interventions   Nutrition Discussed/Reviewed Decreasing fats  Pharmacy Interventions   Pharmacy Dicussed/Reviewed Medications and their functions      Spoke with patient. She is not on any statins but is interested in a statin that does not bother her stomach.  She is aware of her cholesterol and triglycerides being up.          SDOH assessments and interventions completed:  Yes     Care Coordination Interventions:  Yes, provided   Follow up plan: No further intervention required.   Encounter Outcome:  Pt. Visit Completed   Jasmine Baseman, RN, MSN [facility] Care Management Care Management Coordinator Direct Line [phone]'

## 2022-10-15 ENCOUNTER — Other Ambulatory Visit: Payer: Self-pay | Admitting: Nurse Practitioner

## 2022-10-15 DIAGNOSIS — E782 Mixed hyperlipidemia: Secondary | ICD-10-CM

## 2022-10-15 MED ORDER — ATORVASTATIN CALCIUM 20 MG PO TABS: 20.0000 mg | ORAL_TABLET | ORAL | 5 refills | Status: DC

## 2022-10-18 DIAGNOSIS — M25561 Pain in right knee: Secondary | ICD-10-CM | POA: Diagnosis not present

## 2022-10-20 ENCOUNTER — Encounter: Payer: Self-pay | Admitting: Gastroenterology

## 2022-10-20 ENCOUNTER — Ambulatory Visit: Payer: Medicare HMO | Admitting: Gastroenterology

## 2022-10-20 VITALS — BP 110/64 | HR 70 | Ht 61.0 in | Wt 134.5 lb

## 2022-10-20 DIAGNOSIS — K8050 Calculus of bile duct without cholangitis or cholecystitis without obstruction: Secondary | ICD-10-CM

## 2022-10-20 DIAGNOSIS — I700 Atherosclerosis of aorta: Secondary | ICD-10-CM

## 2022-10-20 DIAGNOSIS — K219 Gastro-esophageal reflux disease without esophagitis: Secondary | ICD-10-CM

## 2022-10-20 DIAGNOSIS — Z86010 Personal history of colonic polyps: Secondary | ICD-10-CM | POA: Diagnosis not present

## 2022-10-20 MED ORDER — CLENPIQ 10-3.5-12 MG-GM -GM/175ML PO SOLN: 1.0000 | Freq: Once | ORAL | 0 refills | Status: AC

## 2022-10-20 MED ORDER — URSODIOL 250 MG PO TABS: 250.0000 mg | ORAL_TABLET | Freq: Two times a day (BID) | ORAL | 1 refills | Status: DC

## 2022-10-20 NOTE — Progress Notes (Signed)
Chief Complaint:    Medication management, medication refill  GI History: 69 old female follows in the GI clinic for the following:   1) GERD: Index symptoms of dyspepsia, globus sensation.  No heartburn or regurgitation.  No dysphagia.  Well-controlled with Prilosec 40 mg/day and Pepcid 20 mg qhs and rarely a 2nd dose prn breakthrough (rare). Possible bile acid reflux overlap.   2) Choledocholithiasis: History of cholelithiasis s/p ccy with IOC in 05/2016 with subsequent ERCP in 05/2018.  Readmitted 01/2020 with choledocholithiasis, underwent repeat ERCP with stone extraction.  Complicated by readmission 02/23/2020 with HCAP, pleural effusion treated with ABX and thoracentesis.  Started ursodiol for stone dissolution/choledocholithiasis prevention.  Readmitted 09/2021 with recurrent CDL as below.  MRI/MRCP with progressive biliary duct dilatation due to dominant 15 mm mid to distal common duct stone.  Underwent repeat ERCP on 10/15/2021 as below.  CA 19-9 was elevated, then subsequently normalized 1 week later.  Normal CEA.   3) History of colon polyps: Due for repeat colonoscopy in 2023 for ongoing polyp surveillance   Endoscopic history: -ERCP (02/2016, Dr. Watt Climes, [place]): Choledocholithiasis, removed with sphincterotomy and balloon sweeps -ERCP (05/2016, Dr. Watt Climes): Choledocholithiasis, removed with further sphincterotomy and balloon sweeps with removal of stones and sludge -EGD (05/2018, Dr. Watt Climes): Normal -ERCP (05/2018, Dr. Watt Climes): No stones on cholangiogram.  Balloon sweeps (nothing removed) and dilation of distal CBD/ampulla with 4 cm x 8 mm balloon dilator to facilitate improved drainage -Colonoscopy (09/2018, Dr. Watt Climes): External/internal hemorrhoids, sigmoid diverticulosis, 7 polyps resected (no path review), normal TI.  Recommended repeat in 3 years -ERCP (01/2020, Dr. Lyndel Safe): Removal of multiple stones and pus.  Normal post occlusion cholangiogram - ERCP (09/2021, Dr. Lyndel Safe): Dilated biliary  tree with "sigmoid" appearing CBD measuring 15 mm.  Multiple filling defects c/w choledocholithiasis.  Sphincteroplasty of the CBD with 13.5 mm balloon then biliary tree swept with removal of several small stones and sludge.  Largest stone could not be removed with 15 mm balloon.  Spy scope inserted.  No stricture noted.  Large stone noted in EHL performed with fracturing.  CBD then swept with 15 mm balloon several times with extraction of significant sludge.  Placed 10 French by 7 cm plastic stent into CBD.  - ERCP (12/09/2021, Dr. Rush Landmark): Severely dilated bile duct up to 18 mm significant angulation of the lower third of CBD without overt stricture, multiple filling defects in upper CBD removed with balloon sweeps and sphincteroplasty.  1 cm stone remained removed with lithotripsy and repeat balloon sweeps.  Spyglass performed with removal of additional retained stones.  No stones remained on occlusion cholangiogram.  Brushings performed for cytology (cytology benign).  Remains at risk for recurrent stones  HPI:     Patient is a 67 y.o. female presenting to the Gastroenterology Clinic for follow-up.  Was last seen in the office by Ellouise Newer on 09/20/2022.  Main issue at that time was sudden onset LLQ pain and nausea.  Clinical history seems more consistent with MSK etiology, but out of abundance of caution, CT was ordered due to her history. - 09/20/2022: Normal liver enzymes, BMP, CBC - 09/21/2022: CT A/P: Sigmoid diverticulosis without diverticulitis, pneumobilia consistent with history of sphincterotomy.  Otherwise unremarkable and no acute findings.  No longer having any abdominal pain or nausea.  Main issue today is that she would like to discuss atorvastatin and its possible effects on her liver. Was recently prescribed on 10/15/2022, but has not started yet. No prior statins.  Also needs RF of Ursodiol.   Review of systems:     No chest pain, no SOB, no fevers, no urinary sx   Past  Medical History:  Diagnosis Date   Allergy    Cholelithiasis with choledocholithiasis    Diverticulitis    Epigastric abdominal pain with nausea and vomiting 08/20/2021   GERD (gastroesophageal reflux disease)    Mixed hyperlipidemia 10/06/2021   Tobacco abuse    Varicose vein of leg    no problems now   Wears glasses     Patient's surgical history, family medical history, social history, medications and allergies were all reviewed in Epic    Current Outpatient Medications  Medication Sig Dispense Refill   acetaminophen (TYLENOL) 500 MG tablet Take 500 mg by mouth every 6 (six) hours as needed for mild pain or moderate pain.     atorvastatin (LIPITOR) 20 MG tablet Take 1 tablet (20 mg total) by mouth 3 (three) times a week. 12 tablet 5   Calcium Carbonate-Vit D-Min (CALCIUM 1200 PO) Take 1 tablet by mouth daily.     Carboxymethylcellulose Sodium (THERATEARS) 0.25 % SOLN Place 1 drop into both eyes as needed (eye irritation).     cetirizine (ZYRTEC) 10 MG tablet Take 10 mg by mouth daily as needed for allergies.     famotidine (PEPCID) 20 MG tablet TAKE 1 TABLET BY MOUTH EVERYDAY AT BEDTIME 90 tablet 1   meloxicam (MOBIC) 15 MG tablet Take 15 mg by mouth daily.     Multiple Vitamin (MULTIVITAMIN WITH MINERALS) TABS tablet Take 1 tablet by mouth daily with lunch.     omeprazole (PRILOSEC) 40 MG capsule TAKE 1 CAPSULE BY MOUTH EVERY DAY 90 capsule 2   ondansetron (ZOFRAN) 4 MG tablet Take 1 tablet (4 mg total) by mouth every 4-6 hours as needed for nausea 30 tablet 0   Probiotic Product (PROBIOTIC PO) Take 1 tablet by mouth daily.     ursodiol (ACTIGALL) 250 MG tablet Take 1 tablet (250 mg total) by mouth 2 (two) times daily. 180 tablet 1   dicyclomine (BENTYL) 10 MG capsule Take 1-2 tablets by mouth every 4-6 hours as needed for abdominal pain or discomfort. 60 capsule 2   No current facility-administered medications for this visit.    Physical Exam:     BP 110/64   Pulse 70    Ht '5\' 1"'$  (1.549 m)   Wt 134 lb 8 oz (61 kg)   BMI 25.41 kg/m   GENERAL:  Pleasant female in NAD PSYCH: : Cooperative, normal affect NEURO: Alert and oriented x 3, no focal neurologic deficits   IMPRESSION and PLAN:    1) Choledocholithiasis 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  Has undergone several ERCPs in the past with sphincteroplasty, balloon sweeps, lithotripsy, spyglass, etc. as outlined above.  Last episode was 11/2021.  Otherwise negative CBD cytology.  CA125 normalized.  Repeat MRCP in 05/2022 with near complete resolution of biliary duct dilation, measuring 8 mm without CDL. - Continue ursodiol - Refill placed for ursodiol - Consider repeat MRCP in 05/2023 vs continued observation  2) History of colon polyps - Schedule repeat colonoscopy for ongoing polyp surveillance.  Needs to be scheduled on a Friday due to her son's work schedule  3) GERD - Well-controlled on current therapy - Continue omeprazole  4) Atherosclerosis - Ok to start statin as prescribed - Plan for checking liver enzymes at 1 and 3 months as already scheduled  by her PCM   - RTC in 12 months or sooner prn  The indications, risks, and benefits of colonoscopy were explained to the patient in detail. Risks include but are not limited to bleeding, perforation, adverse reaction to medications, and cardiopulmonary compromise. Sequelae include but are not limited to the possibility of surgery, hospitalization, and mortality. The patient verbalized understanding and wished to proceed. All questions answered, referred to the scheduler and bowel prep ordered. Further recommendations pending results of the exam.            Jasmine Bradford ,DO, FACG 10/20/2022, 8:53 AM

## 2022-10-20 NOTE — Patient Instructions (Addendum)
We have sent the following medications to your pharmacy for you to pick up at your convenience:  Ursodiol 250 mg, Clenpiq  You have been scheduled for a colonoscopy. Please follow written instructions given to you at your visit today.  Please pick up your prep supplies at the pharmacy within the next 1-3 days. If you use inhalers (even only as needed), please bring them with you on the day of your procedure.   Please follow up in 12 months. Give [place] a call at [phone] to schedule an appointment.  _______________________________________________________  If your blood pressure at your visit was 140/90 or greater, please contact your primary care physician to follow up on this.  _______________________________________________________  If you are age 69 or older, your body mass index should be between 23-30. Your Body mass index is 25.41 kg/m. If this is out of the aforementioned range listed, please consider follow up with your Primary Care Provider. __________________________________________________________  The [facility] providers would like to encourage you to use [facility] to communicate with providers for non-urgent requests or questions.  Due to long hold times on the telephone, sending your provider a message by [facility] may be a faster and more efficient way to get a response.  Please allow 48 business hours for a response.  Please remember that this is for non-urgent requests.   Due to recent changes in healthcare laws, you may see the results of your imaging and laboratory studies on MyChart before your provider has had a chance to review them.  We understand that in some cases there may be results that are confusing or concerning to you. Not all laboratory results come back in the same time frame and the provider may be waiting for multiple results in order to interpret others.  Please give [place] 48 hours in order for your provider to thoroughly review all the results before contacting the  office for clarification of your results.    Thank you for choosing me and [facility].  Vito Cirigliano, D.O.

## 2022-10-24 ENCOUNTER — Encounter: Payer: Self-pay | Admitting: Gastroenterology

## 2022-10-24 ENCOUNTER — Encounter: Payer: Self-pay | Admitting: Nurse Practitioner

## 2022-10-24 ENCOUNTER — Ambulatory Visit (INDEPENDENT_AMBULATORY_CARE_PROVIDER_SITE_OTHER): Payer: Medicare HMO | Admitting: Nurse Practitioner

## 2022-10-24 VITALS — BP 112/64 | HR 71 | Resp 16 | Ht 61.2 in | Wt 133.2 lb

## 2022-10-24 DIAGNOSIS — M791 Myalgia, unspecified site: Secondary | ICD-10-CM

## 2022-10-24 DIAGNOSIS — M2550 Pain in unspecified joint: Secondary | ICD-10-CM

## 2022-10-24 DIAGNOSIS — E782 Mixed hyperlipidemia: Secondary | ICD-10-CM

## 2022-10-24 LAB — URIC ACID: Uric Acid, Serum: 5.6 mg/dL (ref 2.4–7.0)

## 2022-10-24 LAB — SEDIMENTATION RATE: Sed Rate: 4 mm/hr (ref 0–30)

## 2022-10-24 LAB — CK: Total CK: 133 U/L (ref 7–177)

## 2022-10-24 LAB — TSH: TSH: 3.77 u[IU]/mL (ref 0.35–5.50)

## 2022-10-24 LAB — C-REACTIVE PROTEIN: CRP: <1 mg/dL (ref 0.5–20.0)

## 2022-10-24 NOTE — Patient Instructions (Addendum)
Before breakfast: omeprazole After breakfast: calcium, multivitamin, probiotic After supper: zyrtec, atorvastatin, ursodiol, and meloxicam At bedtime: famotidine  Go to lab Consider doing chair yoga daily and water aerobics.

## 2022-10-24 NOTE — Progress Notes (Unsigned)
Established Patient Visit  Patient: Jasmine Bradford   DOB: 12-29-1955   67 y.o. Female  MRN: [id number] Visit Date: 10/27/2022  Subjective:    Chief Complaint  Patient presents with   Pain    Shoulder, hip and knee.     HPI Arthralgia of multiple joints Generalized Joint pain-worse in bilateral shoulders and knees, previous eval by ortho-murphy warner, use meloxicam x 4yrwith minimal relief Reports knee joint stiffness in AM, last <365ms, improves with movement but worse again in PM shoulder pain and stiffeness in PM work with activity. No paresthesia, no weakness, no limited ROM Completed outpatient PT and home exercise with minimal improvement Works at [employer] rep[employer]itve walking and lifting. Right hand dorminant  Check ANA, ESR, CRP, ANA, RH-factor, TSH, uric acid and CK Maintain mobic dose Advised to try chair yoga daily and water aerobics. Reviewed medical, surgical, and social history today  Medications: Outpatient Medications Prior to Visit  Medication Sig   AREXVY 120 MCG/0.5ML injection    COMIRNATY SUSP injection Inject into the muscle.   FLUAD QUADRIVALENT 0.5 ML injection    acetaminophen (TYLENOL) 500 MG tablet Take 500 mg by mouth every 6 (six) hours as needed for mild pain or moderate pain.   atorvastatin (LIPITOR) 20 MG tablet Take 1 tablet (20 mg total) by mouth 3 (three) times a week.   Calcium Carbonate-Vit D-Min (CALCIUM 1200 PO) Take 1 tablet by mouth daily.   Carboxymethylcellulose Sodium (THERATEARS) 0.25 % SOLN Place 1 drop into both eyes as needed (eye irritation).   cetirizine (ZYRTEC) 10 MG tablet Take 10 mg by mouth daily as needed for allergies.   famotidine (PEPCID) 20 MG tablet TAKE 1 TABLET BY MOUTH EVERYDAY AT BEDTIME   meloxicam (MOBIC) 15 MG tablet Take 15 mg by mouth daily.   Multiple Vitamin (MULTIVITAMIN WITH MINERALS) TABS tablet Take 1 tablet by mouth daily with lunch.   omeprazole (PRILOSEC) 40 MG  capsule TAKE 1 CAPSULE BY MOUTH EVERY DAY   ondansetron (ZOFRAN) 4 MG tablet Take 1 tablet (4 mg total) by mouth every 4-6 hours as needed for nausea   Probiotic Product (PROBIOTIC PO) Take 1 tablet by mouth daily.   ursodiol (ACTIGALL) 250 MG tablet Take 1 tablet (250 mg total) by mouth 2 (two) times daily.   [DISCONTINUED] dicyclomine (BENTYL) 10 MG capsule Take 1-2 tablets by mouth every 4-6 hours as needed for abdominal pain or discomfort.   No facility-administered medications prior to visit.   Reviewed past medical and social history.   ROS per HPI above  Last CBC Lab Results  Component Value Date   WBC 6.6 09/20/2022   HGB 12.9 09/20/2022   HCT 38.0 09/20/2022   MCV 85.0 09/20/2022   MCH 28.4 12/08/2021   RDW 13.4 09/20/2022   PLT 256.0 [id number] Last metabolic panel Lab Results  Component Value Date   GLUCOSE 106 (H) 09/20/2022   NA 140 09/20/2022   K 4.3 09/20/2022   CL 102 09/20/2022   CO2 29 09/20/2022   BUN 13 09/20/2022   CREATININE 0.77 09/20/2022   GFRNONAA >60 12/08/2021   CALCIUM 9.0 09/20/2022   PHOS 4.4 02/18/2020   PROT 6.9 09/20/2022   ALBUMIN 4.2 09/20/2022   BILITOT 0.4 09/20/2022   ALKPHOS 65 09/20/2022   AST 18 09/20/2022   ALT 18 09/20/2022   ANIONGAP 5 12/08/2021  Last vitamin D Lab Results  Component Value Date   VD25OH 36.34 11/18/2020      Objective:  BP 112/64 (BP Location: Left Arm, Patient Position: Sitting, Cuff Size: Normal)   Pulse 71   Resp 16   Ht 5' 1.2" (1.554 m)   Wt 133 lb 3.2 oz (60.4 kg)   SpO2 99%   BMI 25.00 kg/m      Physical Exam Vitals reviewed.  Cardiovascular:     Rate and Rhythm: Normal rate and regular rhythm.     Pulses: Normal pulses.     Heart sounds: Normal heart sounds.  Pulmonary:     Effort: Pulmonary effort is normal.     Breath sounds: Normal breath sounds.  Musculoskeletal:     Right lower leg: No edema.     Left lower leg: No edema.  Neurological:     Mental Status: She is  alert and oriented to person, place, and time.     Results for orders placed or performed in visit on 10/24/22  Sedimentation rate  Result Value Ref Range   Sed Rate 4 0 - 30 mm/hr  C-reactive protein  Result Value Ref Range   CRP <1.0 0.5 - 20.0 mg/dL  ANA w/Reflex  Result Value Ref Range   Anti Nuclear Antibody (ANA) Negative Negative  Rheumatoid Factor  Result Value Ref Range   Rhuematoid fact SerPl-aCnc <14 <14 IU/mL  TSH  Result Value Ref Range   TSH 3.77 0.35 - 5.50 uIU/mL  Uric acid  Result Value Ref Range   Uric Acid, Serum 5.6 2.4 - 7.0 mg/dL  CK  Result Value Ref Range   Total CK 133 7 - 177 U/L      Assessment & Plan:    Problem List Items Addressed This Visit       Other   Arthralgia of multiple joints - Primary    Generalized Joint pain-worse in bilateral shoulders and knees, previous eval by ortho-murphy warner, use meloxicam x 57yrwith minimal relief Reports knee joint stiffness in AM, last <370ms, improves with movement but worse again in PM shoulder pain and stiffeness in PM work with activity. No paresthesia, no weakness, no limited ROM Completed outpatient PT and home exercise with minimal improvement Works at [employer] rep[employer]ve walking and lifting. Right hand dorminant  Check ANA, ESR, CRP, ANA, RH-factor, TSH, uric acid and CK Maintain mobic dose Advised to try chair yoga daily and water aerobics.      Relevant Orders   Sedimentation rate (Completed)   C-reactive protein (Completed)   ANA w/Reflex (Completed)   Rheumatoid Factor (Completed)   TSH (Completed)   Uric acid (Completed)   CK (Completed)   Other Visit Diagnoses     Myalgia       Relevant Orders   Sedimentation rate (Completed)   C-reactive protein (Completed)   ANA w/Reflex (Completed)   Rheumatoid Factor (Completed)   TSH (Completed)   Uric acid (Completed)   CK (Completed)      Return in about 6 months (around 04/26/2023) for HTN, hyperlipidemia  (fasting).     ChWilfred LacyNP

## 2022-10-25 LAB — RHEUMATOID FACTOR: Rheumatoid fact SerPl-aCnc: <14 IU/mL (ref <14)

## 2022-10-25 LAB — ANA W/REFLEX: Anti Nuclear Antibody (ANA): NEGATIVE

## 2022-10-25 NOTE — Progress Notes (Signed)
Stable Follow instructions as discussed during office visit.

## 2022-10-27 ENCOUNTER — Encounter: Payer: Self-pay | Admitting: Nurse Practitioner

## 2022-10-27 DIAGNOSIS — M2550 Pain in unspecified joint: Secondary | ICD-10-CM | POA: Insufficient documentation

## 2022-10-27 NOTE — Assessment & Plan Note (Addendum)
Generalized Joint pain-worse in bilateral shoulders and knees, previous eval by ortho-murphy warner, use meloxicam x 22yrwith minimal relief Reports knee joint stiffness in AM, last <325ms, improves with movement but worse again in PM shoulder pain and stiffeness in PM work with activity. No paresthesia, no weakness, no limited ROM Completed outpatient PT and home exercise with minimal improvement Works at [employer] repetitve walking and lifting. Right hand dorminant  Check ANA, ESR, CRP, ANA, RH-factor, TSH, uric acid and CK Maintain mobic dose Advised to try chair yoga daily and water aerobics.

## 2022-11-01 DIAGNOSIS — M25511 Pain in right shoulder: Secondary | ICD-10-CM | POA: Diagnosis not present

## 2022-11-01 DIAGNOSIS — M1711 Unilateral primary osteoarthritis, right knee: Secondary | ICD-10-CM | POA: Diagnosis not present

## 2022-11-08 DIAGNOSIS — M1711 Unilateral primary osteoarthritis, right knee: Secondary | ICD-10-CM | POA: Diagnosis not present

## 2022-11-15 ENCOUNTER — Other Ambulatory Visit (INDEPENDENT_AMBULATORY_CARE_PROVIDER_SITE_OTHER): Payer: Medicare HMO

## 2022-11-15 DIAGNOSIS — M1711 Unilateral primary osteoarthritis, right knee: Secondary | ICD-10-CM | POA: Diagnosis not present

## 2022-11-15 DIAGNOSIS — E782 Mixed hyperlipidemia: Secondary | ICD-10-CM | POA: Diagnosis not present

## 2022-11-15 LAB — HEPATIC FUNCTION PANEL
ALT: 15 U/L (ref 0–35)
AST: 15 U/L (ref 0–37)
Albumin: 4.2 g/dL (ref 3.5–5.2)
Alkaline Phosphatase: 71 U/L (ref 39–117)
Bilirubin, Direct: 0.1 mg/dL (ref 0.0–0.3)
Total Bilirubin: 0.8 mg/dL (ref 0.2–1.2)
Total Protein: 6.7 g/dL (ref 6.0–8.3)

## 2022-11-15 LAB — LIPID PANEL
Cholesterol: 219 mg/dL — ABNORMAL HIGH (ref 0–200)
HDL: 71.6 mg/dL (ref >39.00)
LDL Cholesterol: 120 mg/dL — ABNORMAL HIGH (ref 0–99)
NonHDL: 147.12
Total CHOL/HDL Ratio: 3
Triglycerides: 138 mg/dL (ref 0.0–149.0)
VLDL: 27.6 mg/dL (ref 0.0–40.0)

## 2022-11-15 NOTE — Progress Notes (Signed)
Pt here for lab appointment. Labs collected successfully. Pt tolerated well.

## 2022-11-21 DIAGNOSIS — M25511 Pain in right shoulder: Secondary | ICD-10-CM | POA: Diagnosis not present

## 2022-11-22 ENCOUNTER — Encounter: Payer: Self-pay | Admitting: Nurse Practitioner

## 2022-11-25 DIAGNOSIS — S46011A Strain of muscle(s) and tendon(s) of the rotator cuff of right shoulder, initial encounter: Secondary | ICD-10-CM | POA: Diagnosis not present

## 2022-12-02 ENCOUNTER — Encounter: Payer: Medicare HMO | Admitting: Nurse Practitioner

## 2022-12-09 ENCOUNTER — Encounter: Payer: Self-pay | Admitting: Gastroenterology

## 2022-12-09 ENCOUNTER — Ambulatory Visit (AMBULATORY_SURGERY_CENTER): Payer: Medicare HMO | Admitting: Gastroenterology

## 2022-12-09 VITALS — BP 119/68 | HR 80 | Temp 98.0°F | Resp 15 | Ht 61.0 in | Wt 134.0 lb

## 2022-12-09 DIAGNOSIS — D125 Benign neoplasm of sigmoid colon: Secondary | ICD-10-CM | POA: Diagnosis not present

## 2022-12-09 DIAGNOSIS — D128 Benign neoplasm of rectum: Secondary | ICD-10-CM | POA: Diagnosis not present

## 2022-12-09 DIAGNOSIS — Z09 Encounter for follow-up examination after completed treatment for conditions other than malignant neoplasm: Secondary | ICD-10-CM

## 2022-12-09 DIAGNOSIS — K5730 Diverticulosis of large intestine without perforation or abscess without bleeding: Secondary | ICD-10-CM | POA: Diagnosis not present

## 2022-12-09 DIAGNOSIS — K635 Polyp of colon: Secondary | ICD-10-CM | POA: Diagnosis not present

## 2022-12-09 DIAGNOSIS — Z86010 Personal history of colonic polyps: Secondary | ICD-10-CM | POA: Diagnosis not present

## 2022-12-09 DIAGNOSIS — K621 Rectal polyp: Secondary | ICD-10-CM | POA: Diagnosis not present

## 2022-12-09 MED ORDER — SODIUM CHLORIDE 0.9 % IV SOLN: 500.0000 mL | INTRAVENOUS | Status: AC

## 2022-12-09 NOTE — Progress Notes (Signed)
Uneventful anesthetic. Report to pacu rn. Vss. Care resumed by rn. 

## 2022-12-09 NOTE — Progress Notes (Signed)
Called to room to assist during endoscopic procedure.  Patient ID and intended procedure confirmed with present staff. Received instructions for my participation in the procedure from the performing physician.  

## 2022-12-09 NOTE — Progress Notes (Signed)
Pt's states no medical or surgical changes since previsit or office visit. 

## 2022-12-09 NOTE — Patient Instructions (Signed)
Please read handouts provided. Continue present medications. Await pathology results. Resume previous diet. Return to GI office as needed.   YOU HAD AN ENDOSCOPIC PROCEDURE TODAY AT THE [facility]:   Refer to the procedure report that was given to you for any specific questions about what was found during the examination.  If the procedure report does not answer your questions, please call your gastroenterologist to clarify.  If you requested that your care partner not be given the details of your procedure findings, then the procedure report has been included in a sealed envelope for you to review at your convenience later.  YOU SHOULD EXPECT: Some feelings of bloating in the abdomen. Passage of more gas than usual.  Walking can help get rid of the air that was put into your GI tract during the procedure and reduce the bloating. If you had a lower endoscopy (such as a colonoscopy or flexible sigmoidoscopy) you may notice spotting of blood in your stool or on the toilet paper. If you underwent a bowel prep for your procedure, you may not have a normal bowel movement for a few days.  Please Note:  You might notice some irritation and congestion in your nose or some drainage.  This is from the oxygen used during your procedure.  There is no need for concern and it should clear up in a day or so.  SYMPTOMS TO REPORT IMMEDIATELY:  Following lower endoscopy (colonoscopy or flexible sigmoidoscopy):  Excessive amounts of blood in the stool  Significant tenderness or worsening of abdominal pains  Swelling of the abdomen that is new, acute  Fever of 100F or higher    For urgent or emergent issues, a gastroenterologist can be reached at any hour by calling [phone]. Do not use MyChart messaging for urgent concerns.    DIET:  We do recommend a small meal at first, but then you may proceed to your regular diet.  Drink plenty of fluids but you should avoid alcoholic beverages for  24 hours.  ACTIVITY:  You should plan to take it easy for the rest of today and you should NOT DRIVE or use heavy machinery until tomorrow (because of the sedation medicines used during the test).    FOLLOW UP: Our staff will call the number listed on your records the next business day following your procedure.  We will call around 7:15- 8:00 am to check on you and address any questions or concerns that you may have regarding the information given to you following your procedure. If we do not reach you, we will leave a message.     If any biopsies were taken you will be contacted by phone or by letter within the next 1-3 weeks.  Please call us at [phone] if you have not heard about the biopsies in 3 weeks.    SIGNATURES/CONFIDENTIALITY: You and/or your care partner have signed paperwork which will be entered into your electronic medical record.  These signatures attest to the fact that that the information above on your After Visit Summary has been reviewed and is understood.  Full responsibility of the confidentiality of this discharge information lies with you and/or your care-partner.

## 2022-12-09 NOTE — Op Note (Signed)
[facility] Patient Name: Jasmine Bradford Procedure Date: 12/09/2022 1:23 PM MRN: [id number] Endoscopist: Doristine Locks , MD, [id number] Age: 67 Referring MD:  Date of Birth: 09-May-1956 Gender: Female Account #: [id number] Procedure:                Colonoscopy Indications:              Surveillance: Personal history of colonic polyps                            (unknown histology) on last colonoscopy more than 3                            years ago                           Last colonoscopy was 09/2018 at outside facility and                            notable for 7 polyps (path unknown) with                            recommendation to repeat in 3 years. Medicines:                Monitored Anesthesia Care Procedure:                Pre-Anesthesia Assessment:                           - Prior to the procedure, a History and Physical                            was performed, and patient medications and                            allergies were reviewed. The patient's tolerance of                            previous anesthesia was also reviewed. The risks                            and benefits of the procedure and the sedation                            options and risks were discussed with the patient.                            All questions were answered, and informed consent                            was obtained. Prior Anticoagulants: The patient has                            taken no anticoagulant or antiplatelet agents. ASA  Grade Assessment: III - A patient with severe                            systemic disease. After reviewing the risks and                            benefits, the patient was deemed in satisfactory                            condition to undergo the procedure.                           After obtaining informed consent, the colonoscope                            was passed under direct vision. Throughout the                             procedure, the patient's blood pressure, pulse, and                            oxygen saturations were monitored continuously. The                            CF [id number] #[id number] was introduced through the anus                            and advanced to the the terminal ileum. The                            colonoscopy was performed without difficulty. The                            patient tolerated the procedure well. The quality                            of the bowel preparation was good. The terminal                            ileum, ileocecal valve, appendiceal orifice, and                            rectum were photographed. Scope In: 1:29:27 PM Scope Out: 1:52:24 PM Scope Withdrawal Time: 0 hours 19 minutes 42 seconds  Total Procedure Duration: 0 hours 22 minutes 57 seconds  Findings:                 The perianal and digital rectal examinations were                            normal.                           Two sessile polyps were found in the sigmoid colon.  The polyps were 1 to 4 mm in size. These polyps                            were removed with a cold snare. Resection and                            retrieval were complete. Estimated blood loss was                            minimal.                           Multiple sessile polyps were found in the rectum.                            The polyps were 1 to 3 mm in size. Several of these                            polyps were removed with a cold snare for                            histologic representative evaluation. Resection and                            retrieval were complete. Estimated blood loss was                            minimal.                           Multiple medium-mouthed and small-mouthed                            diverticula were found in the sigmoid colon.                           The retroflexed view of the distal rectum and anal                            verge  was normal and showed no anal or rectal                            abnormalities.                           The terminal ileum appeared normal. Complications:            No immediate complications. Estimated Blood Loss:     Estimated blood loss was minimal. Impression:               - Two 1 to 4 mm polyps in the sigmoid colon,                            removed with a cold snare. Resected and retrieved.                           -  Multiple 1 to 3 mm polyps in the rectum, removed                            with a cold snare. Resected and retrieved.                           - Diverticulosis in the sigmoid colon.                           - The distal rectum and anal verge are normal on                            retroflexion view.                           - The examined portion of the ileum was normal. Recommendation:           - Patient has a contact number available for                            emergencies. The signs and symptoms of potential                            delayed complications were discussed with the                            patient. Return to normal activities tomorrow.                            Written discharge instructions were provided to the                            patient.                           - Resume previous diet.                           - Continue present medications.                           - Await pathology results.                           - Repeat colonoscopy for surveillance based on                            pathology results.                           - Return to GI office PRN. Doristine Locks, MD 12/09/2022 2:00:31 PM

## 2022-12-09 NOTE — Progress Notes (Signed)
GASTROENTEROLOGY PROCEDURE H&P NOTE   Primary Care Physician: Anne Ng, NP    Reason for Procedure:  Colon polyp surveillance  Plan:    Colonoscopy  Patient is appropriate for endoscopic procedure(s) in the ambulatory ([facility]) setting.  The nature of the procedure, as well as the risks, benefits, and alternatives were carefully and thoroughly reviewed with the patient. Ample time for discussion and questions allowed. The patient understood, was satisfied, and agreed to proceed.     HPI: Armenta Erskin is a 67 y.o. female who presents for colonoscopy for ongoing polyp surveillance.  Otherwise without any active GI symptoms.  Endoscopic history: -ERCP (02/2016, Dr. Ewing Schlein, CDL): Choledocholithiasis, removed with sphincterotomy and balloon sweeps -ERCP (05/2016, Dr. Ewing Schlein): Choledocholithiasis, removed with further sphincterotomy and balloon sweeps with removal of stones and sludge -EGD (05/2018, Dr. Ewing Schlein): Normal -ERCP (05/2018, Dr. Ewing Schlein): No stones on cholangiogram.  Balloon sweeps (nothing removed) and dilation of distal CBD/ampulla with 4 cm x 8 mm balloon dilator to facilitate improved drainage -Colonoscopy (09/2018, Dr. Ewing Schlein): External/internal hemorrhoids, sigmoid diverticulosis, 7 polyps resected (no path review), normal TI.  Recommended repeat in 3 years -ERCP (01/2020, Dr. Chales Abrahams): Removal of multiple stones and pus.  Normal post occlusion cholangiogram - ERCP (09/2021, Dr. Chales Abrahams): Dilated biliary tree with "sigmoid" appearing CBD measuring 15 mm.  Multiple filling defects c/w choledocholithiasis.  Sphincteroplasty of the CBD with 13.5 mm balloon then biliary tree swept with removal of several small stones and sludge.  Largest stone could not be removed with 15 mm balloon.  Spy scope inserted.  No stricture noted.  Large stone noted in EHL performed with fracturing.  CBD then swept with 15 mm balloon several times with extraction of significant sludge.  Placed 10  French by 7 cm plastic stent into CBD.  - ERCP (12/09/2021, Dr. Meridee Score): Severely dilated bile duct up to 18 mm significant angulation of the lower third of CBD without overt stricture, multiple filling defects in upper CBD removed with balloon sweeps and sphincteroplasty.  1 cm stone remained removed with lithotripsy and repeat balloon sweeps.  Spyglass performed with removal of additional retained stones.  No stones remained on occlusion cholangiogram.  Brushings performed for cytology (cytology benign).  Remains at risk for recurrent stones  Past Medical History:  Diagnosis Date   Allergy    Cholelithiasis with choledocholithiasis    Diverticulitis    Epigastric abdominal pain with nausea and vomiting 08/20/2021   GERD (gastroesophageal reflux disease)    Mixed hyperlipidemia 10/06/2021   Tobacco abuse    Varicose vein of leg    no problems now   Wears glasses     Past Surgical History:  Procedure Laterality Date   BILIARY BRUSHING  12/09/2021   Procedure: BILIARY BRUSHING;  Surgeon: Lemar Lofty., MD;  Location: [facility] ENDOSCOPY;  Service: Gastroenterology;;   BILIARY DILATION  02/19/2020   Procedure: BILIARY DILATION;  Surgeon: Lynann Bologna, MD;  Location: [facility] ENDOSCOPY;  Service: Endoscopy;;   BILIARY DILATION  10/15/2021   Procedure: BILIARY DILATION;  Surgeon: Lynann Bologna, MD;  Location: [facility] ENDOSCOPY;  Service: Gastroenterology;;   BILIARY DILATION  12/09/2021   Procedure: BILIARY DILATION;  Surgeon: Lemar Lofty., MD;  Location: [facility] ENDOSCOPY;  Service: Gastroenterology;;   BILIARY STENT PLACEMENT  10/15/2021   Procedure: BILIARY STENT PLACEMENT;  Surgeon: Lynann Bologna, MD;  Location: [facility] ENDOSCOPY;  Service: Gastroenterology;;   CESAREAN SECTION     CHOLECYSTECTOMY N/A 05/23/2016   Procedure: LAPAROSCOPIC CHOLECYSTECTOMY WITH  INTRAOPERATIVE CHOLANGIOGRAM;  Surgeon: Avel Peace, MD;  Location: [facility] OR;  Service: General;  Laterality: N/A;   ENDOSCOPIC  RETROGRADE CHOLANGIOPANCREATOGRAPHY (ERCP) WITH PROPOFOL N/A 06/06/2016   Procedure: ENDOSCOPIC RETROGRADE CHOLANGIOPANCREATOGRAPHY (ERCP) WITH PROPOFOL;  Surgeon: Vida Rigger, MD;  Location[facility] ENDOSCOPY;  Service: Endoscopy;  Laterality: N/A;   ENDOSCOPIC RETROGRADE CHOLANGIOPANCREATOGRAPHY (ERCP) WITH PROPOFOL N/A 02/19/2020   Procedure: ENDOSCOPIC RETROGRADE CHOLANGIOPANCREATOGRAPHY (ERCP) WITH PROPOFOL;  Surgeon: Lynann Bologna, MD;  Location: [facility] ENDOSCOPY;  Service: Endoscopy;  Laterality: N/A;   ENDOSCOPIC RETROGRADE CHOLANGIOPANCREATOGRAPHY (ERCP) WITH PROPOFOL N/A 10/15/2021   Procedure: ENDOSCOPIC RETROGRADE CHOLANGIOPANCREATOGRAPHY (ERCP) WITH PROPOFOL;  Surgeon: Lynann Bologna, MD;  Location: [facility] ENDOSCOPY;  Service: Gastroenterology;  Laterality: N/A;   ENDOSCOPIC RETROGRADE CHOLANGIOPANCREATOGRAPHY (ERCP) WITH PROPOFOL N/A 12/09/2021   Procedure: ENDOSCOPIC RETROGRADE CHOLANGIOPANCREATOGRAPHY (ERCP) WITH PROPOFOL;  Surgeon: Meridee Score Netty Starring., MD;  Location: [facility] ENDOSCOPY;  Service: Gastroenterology;  Laterality: N/A;   ERCP N/A 03/09/2016   Procedure: ENDOSCOPIC RETROGRADE CHOLANGIOPANCREATOGRAPHY (ERCP);  Surgeon: Vida Rigger, MD;  Location: [facility] ENDOSCOPY;  Service: Endoscopy;  Laterality: N/A;   ERCP N/A 06/12/2018   Procedure: ENDOSCOPIC RETROGRADE CHOLANGIOPANCREATOGRAPHY (ERCP);  Surgeon: Vida Rigger, MD;  Location: [facility] ENDOSCOPY;  Service: Endoscopy;  Laterality: N/A;   ESOPHAGOGASTRODUODENOSCOPY N/A 06/12/2018   Procedure: ESOPHAGOGASTRODUODENOSCOPY (EGD);  Surgeon: Vida Rigger, MD;  Location: [facility] ENDOSCOPY;  Service: Endoscopy;  Laterality: N/A;   laser vein surgery     last year   LITHOTRIPSY  12/09/2021   Procedure: LITHOTRIPSY;  Surgeon: Mansouraty, Netty Starring., MD;  Location: [facility] ENDOSCOPY;  Service: Gastroenterology;;   REMOVAL OF STONES  06/12/2018   Procedure: REMOVAL OF STONES;  Surgeon: Vida Rigger, MD;  Location: [facility] ENDOSCOPY;  Service: Endoscopy;;   REMOVAL OF STONES   02/19/2020   Procedure: REMOVAL OF STONES;  Surgeon: Lynann Bologna, MD;  Location: [facility] ENDOSCOPY;  Service: Endoscopy;;   REMOVAL OF STONES  10/15/2021   Procedure: REMOVAL OF STONES;  Surgeon: Lynann Bologna, MD;  Location: [facility] ENDOSCOPY;  Service: Gastroenterology;;   REMOVAL OF STONES  12/09/2021   Procedure: REMOVAL OF STONES;  Surgeon: Lemar Lofty., MD;  Location: [facility] ENDOSCOPY;  Service: Gastroenterology;;   ROBOTIC ASSISTED SALPINGO OOPHERECTOMY Bilateral 12/21/2021   Procedure: XI ROBOTIC ASSISTED SALPINGO OOPHORECTOMY;  Surgeon: Carver Fila, MD;  Location: [facility] ORS;  Service: Gynecology;  Laterality: Bilateral;   SPHINCTEROTOMY  06/12/2018   Procedure: SPHINCTEROTOMY;  Surgeon: Vida Rigger, MD;  Location: [facility] ENDOSCOPY;  Service: Endoscopy;;   SPHINCTEROTOMY  02/19/2020   Procedure: Dennison Mascot;  Surgeon: Lynann Bologna, MD;  Location: [facility] ENDOSCOPY;  Service: Endoscopy;;   SPYGLASS CHOLANGIOSCOPY N/A 10/15/2021   Procedure: ZOXWRUEA CHOLANGIOSCOPY;  Surgeon: Lynann Bologna, MD;  Location: [facility] ENDOSCOPY;  Service: Gastroenterology;  Laterality: N/A;   SPYGLASS CHOLANGIOSCOPY N/A 12/09/2021   Procedure: SPYGLASS CHOLANGIOSCOPY;  Surgeon: Lemar Lofty., MD;  Location: [facility] ENDOSCOPY;  Service: Gastroenterology;  Laterality: N/A;   SPYGLASS LITHOTRIPSY N/A 10/15/2021   Procedure: [id number] LITHOTRIPSY;  Surgeon: Lynann Bologna, MD;  Location: [facility] ENDOSCOPY;  Service: Gastroenterology;  Laterality: N/A;   STENT REMOVAL  12/09/2021   Procedure: STENT REMOVAL;  Surgeon: Lemar Lofty., MD;  Location: [facility] ENDOSCOPY;  Service: Gastroenterology;;   TUBAL LIGATION     at time of c-section   VARICOSE VEIN SURGERY Left     Prior to Admission medications   Medication Sig Start Date End Date Taking? Authorizing Provider  acetaminophen (TYLENOL) 500 MG tablet Take 500 mg by mouth every 6 (six) hours as needed  for mild pain or moderate pain.   Yes [provider]   atorvastatin (LIPITOR) 20 MG tablet Take 1 tablet (20 mg total) by mouth 3 (three) times a week. 10/17/22  Yes Nche, Bonna Gains, NP  Calcium Carbonate-Vit D-Min (CALCIUM 1200 PO) Take 1 tablet by mouth daily.   Yes [provider]  famotidine (PEPCID) 20 MG tablet TAKE 1 TABLET BY MOUTH EVERYDAY AT BEDTIME 10/10/22  Yes Mayce Noyes V, DO  omeprazole (PRILOSEC) 40 MG capsule TAKE 1 CAPSULE BY MOUTH EVERY DAY 05/27/22  Yes Karl Erway V, DO  Probiotic Product (PROBIOTIC PO) Take 1 tablet by mouth daily.   Yes [provider]  ursodiol (ACTIGALL) 250 MG tablet Take 1 tablet (250 mg total) by mouth 2 (two) times daily. 10/20/22  Yes Nari Vannatter V, DO  AREXVY 120 MCG/0.5ML injection  08/27/22   [provider]  Carboxymethylcellulose Sodium (THERATEARS) 0.25 % SOLN Place 1 drop into both eyes as needed (eye irritation).    [provider]  cetirizine (ZYRTEC) 10 MG tablet Take 10 mg by mouth daily as needed for allergies.    [provider]  COMIRNATY SUSP injection Inject into the muscle. 07/08/22   [provider]  FLUAD QUADRIVALENT 0.5 ML injection  05/07/22   [provider]  meloxicam (MOBIC) 15 MG tablet Take 15 mg by mouth daily. 05/31/22   [provider]  Multiple Vitamin (MULTIVITAMIN WITH MINERALS) TABS tablet Take 1 tablet by mouth daily with lunch.    [provider]  ondansetron (ZOFRAN) 4 MG tablet Take 1 tablet (4 mg total) by mouth every 4-6 hours as needed for nausea 09/26/22   Unk Lightning, PA    Current Outpatient Medications  Medication Sig Dispense Refill   acetaminophen (TYLENOL) 500 MG tablet Take 500 mg by mouth every 6 (six) hours as needed for mild pain or moderate pain.     atorvastatin (LIPITOR) 20 MG tablet Take 1 tablet (20 mg total) by mouth 3 (three) times a week. 12 tablet 5   Calcium Carbonate-Vit D-Min (CALCIUM 1200 PO) Take 1 tablet by mouth daily.     famotidine  (PEPCID) 20 MG tablet TAKE 1 TABLET BY MOUTH EVERYDAY AT BEDTIME 90 tablet 1   omeprazole (PRILOSEC) 40 MG capsule TAKE 1 CAPSULE BY MOUTH EVERY DAY 90 capsule 2   Probiotic Product (PROBIOTIC PO) Take 1 tablet by mouth daily.     ursodiol (ACTIGALL) 250 MG tablet Take 1 tablet (250 mg total) by mouth 2 (two) times daily. 180 tablet 1   AREXVY 120 MCG/0.5ML injection      Carboxymethylcellulose Sodium (THERATEARS) 0.25 % SOLN Place 1 drop into both eyes as needed (eye irritation).     cetirizine (ZYRTEC) 10 MG tablet Take 10 mg by mouth daily as needed for allergies.     COMIRNATY SUSP injection Inject into the muscle.     FLUAD QUADRIVALENT 0.5 ML injection      meloxicam (MOBIC) 15 MG tablet Take 15 mg by mouth daily.     Multiple Vitamin (MULTIVITAMIN WITH MINERALS) TABS tablet Take 1 tablet by mouth daily with lunch.     ondansetron (ZOFRAN) 4 MG tablet Take 1 tablet (4 mg total) by mouth every 4-6 hours as needed for nausea 30 tablet 0   Current Facility-Administered Medications  Medication Dose Route Frequency Provider Last Rate Last Admin   0.9 %  sodium chloride infusion  500 mL Intravenous Continuous Lameisha Schuenemann V,  DO        Allergies as of 12/09/2022 - Review Complete 12/09/2022  Allergen Reaction Noted   Oxycodone Nausea And Vomiting and Other (See Comments) 06/04/2016   Potassium Itching and Rash 02/23/2020   Tape Itching, Swelling, and Rash 10/21/2021    Family History  Problem Relation Age of Onset   Memory loss Mother    Ovarian cancer Mother    Colon cancer Neg Hx    Breast cancer Neg Hx    Endometrial cancer Neg Hx    Pancreatic cancer Neg Hx    Prostate cancer Neg Hx     Social History   Socioeconomic History   Marital status: Widowed    Spouse name: Riley Lam   Number of children: 4   Years of education: Not on file   Highest education level: Not on file  Occupational History   Not on file  Tobacco Use   Smoking status: Former    Packs/day: 0.50     Years: 35.00    Additional pack years: 0.00    Total pack years: 17.50    Types: Cigarettes   Smokeless tobacco: Never   Tobacco comments:    No smoking since 02/2016  Vaping Use   Vaping Use: Never used  Substance and Sexual Activity   Alcohol use: No   Drug use: Never   Sexual activity: Not Currently    Partners: Male    Birth control/protection: Post-menopausal, Surgical    Comment: BTL  Other Topics Concern   Not on file  Social History Narrative   Not on file   Social Determinants of Health   Financial Resource Strain: Low Risk  (06/07/2022)   Overall Financial Resource Strain (CARDIA)    Difficulty of Paying Living Expenses: Not hard at all  Food Insecurity: No Food Insecurity (06/07/2022)   Hunger Vital Sign    Worried About Running Out of Food in the Last Year: Never true    Ran Out of Food in the Last Year: Never true  Transportation Needs: No Transportation Needs (09/05/2022)   PRAPARE - Administrator, Civil Service (Medical): No    Lack of Transportation (Non-Medical): No  Physical Activity: Sufficiently Active (06/07/2022)   Exercise Vital Sign    Days of Exercise per Week: 5 days    Minutes of Exercise per Session: 60 min  Stress: No Stress Concern Present (06/07/2022)   [employer] of Occupational Health - Occupational Stress Questionnaire    Feeling of Stress : Not at all  Social Connections: Socially Isolated (06/07/2022)   Social Connection and Isolation Panel [NHANES]    Frequency of Communication with Friends and Family: More than three times a week    Frequency of Social Gatherings with Friends and Family: More than three times a week    Attends Religious Services: Never    Database administrator or Organizations: No    Attends Banker Meetings: Never    Marital Status: Widowed  Intimate Partner Violence: Not At Risk (06/07/2022)   Humiliation, Afraid, Rape, and Kick questionnaire    Fear of Current or Ex-Partner:  No    Emotionally Abused: No    Physically Abused: No    Sexually Abused: No    Physical Exam: Vital signs in last 24 hours: @BP  120/73   Pulse 79   Temp 98 F (36.7 C)   Ht 5\' 1"  (1.549 m)   Wt 134 lb (60.8 kg)   SpO2 98%  BMI 25.32 kg/m  GEN: NAD EYE: Sclerae anicteric ENT: MMM CV: Non-tachycardic Pulm: CTA b/l GI: Soft, NT/ND NEURO:  Alert & Oriented x 3   Doristine Locks, DO [facility]   12/09/2022 1:12 PM

## 2022-12-22 DIAGNOSIS — S46011D Strain of muscle(s) and tendon(s) of the rotator cuff of right shoulder, subsequent encounter: Secondary | ICD-10-CM | POA: Diagnosis not present

## 2022-12-23 ENCOUNTER — Other Ambulatory Visit: Payer: Self-pay | Admitting: Orthopaedic Surgery

## 2022-12-23 DIAGNOSIS — Z01818 Encounter for other preprocedural examination: Secondary | ICD-10-CM

## 2023-01-19 ENCOUNTER — Encounter: Payer: Self-pay | Admitting: Orthopaedic Surgery

## 2023-01-20 ENCOUNTER — Ambulatory Visit
Admission: RE | Admit: 2023-01-20 | Discharge: 2023-01-20 | Disposition: A | Discharge status: Home / Self Care | Payer: Medicare HMO | Source: Ambulatory Visit | Attending: Orthopaedic Surgery | Admitting: Orthopaedic Surgery

## 2023-01-20 DIAGNOSIS — M19011 Primary osteoarthritis, right shoulder: Secondary | ICD-10-CM | POA: Diagnosis not present

## 2023-01-20 DIAGNOSIS — M25511 Pain in right shoulder: Secondary | ICD-10-CM | POA: Diagnosis not present

## 2023-01-20 DIAGNOSIS — Z01818 Encounter for other preprocedural examination: Secondary | ICD-10-CM

## 2023-01-20 DIAGNOSIS — M93811 Other specified osteochondropathies, right shoulder: Secondary | ICD-10-CM | POA: Diagnosis not present

## 2023-01-26 DIAGNOSIS — Z01812 Encounter for preprocedural laboratory examination: Secondary | ICD-10-CM | POA: Diagnosis not present

## 2023-01-26 DIAGNOSIS — S46011D Strain of muscle(s) and tendon(s) of the rotator cuff of right shoulder, subsequent encounter: Secondary | ICD-10-CM | POA: Diagnosis not present

## 2023-01-26 DIAGNOSIS — M25511 Pain in right shoulder: Secondary | ICD-10-CM | POA: Diagnosis not present

## 2023-02-06 ENCOUNTER — Ambulatory Visit (INDEPENDENT_AMBULATORY_CARE_PROVIDER_SITE_OTHER): Payer: Medicare HMO | Admitting: Nurse Practitioner

## 2023-02-06 ENCOUNTER — Encounter: Payer: Self-pay | Admitting: Gastroenterology

## 2023-02-06 ENCOUNTER — Encounter: Payer: Self-pay | Admitting: Nurse Practitioner

## 2023-02-06 VITALS — BP 112/62 | HR 82 | Temp 97.9°F | Resp 16 | Ht 61.0 in | Wt 132.2 lb

## 2023-02-06 DIAGNOSIS — R7989 Other specified abnormal findings of blood chemistry: Secondary | ICD-10-CM | POA: Diagnosis not present

## 2023-02-06 DIAGNOSIS — E782 Mixed hyperlipidemia: Secondary | ICD-10-CM

## 2023-02-06 DIAGNOSIS — M791 Myalgia, unspecified site: Secondary | ICD-10-CM | POA: Diagnosis not present

## 2023-02-06 DIAGNOSIS — M2550 Pain in unspecified joint: Secondary | ICD-10-CM

## 2023-02-06 LAB — HEPATIC FUNCTION PANEL
ALT: 15 U/L (ref 0–35)
AST: 18 U/L (ref 0–37)
Albumin: 4.3 g/dL (ref 3.5–5.2)
Alkaline Phosphatase: 75 U/L (ref 39–117)
Bilirubin, Direct: 0.1 mg/dL (ref 0.0–0.3)
Total Bilirubin: 0.7 mg/dL (ref 0.2–1.2)
Total Protein: 7 g/dL (ref 6.0–8.3)

## 2023-02-06 LAB — LIPID PANEL
Cholesterol: 182 mg/dL (ref 0–200)
HDL: 58.8 mg/dL (ref >39.00)
LDL Cholesterol: 98 mg/dL (ref 0–99)
NonHDL: 123.09
Total CHOL/HDL Ratio: 3
Triglycerides: 125 mg/dL (ref 0.0–149.0)
VLDL: 25 mg/dL (ref 0.0–40.0)

## 2023-02-06 MED ORDER — DULOXETINE HCL 20 MG PO CPEP
20.0000 mg | ORAL_CAPSULE | Freq: Every day | ORAL | 5 refills | Status: DC
Start: 2023-02-06 — End: 2023-03-03

## 2023-02-06 NOTE — Assessment & Plan Note (Signed)
Negative arthritis panel Minimal improvement with tylenol and mobic. She agreed to start cymbalta 20mg  daily. Consider use of gabapentin vs lyrica if no improvement with cymbalta or unable to tolerate med. F/up in 60month (video)

## 2023-02-06 NOTE — Patient Instructions (Signed)
Go to lab

## 2023-02-06 NOTE — Assessment & Plan Note (Signed)
Repeat lipid panel ?

## 2023-02-06 NOTE — Progress Notes (Signed)
Established Patient Visit  Patient: Madisyn Ryder   DOB: 08/02/1956   67 y.o. Female  MRN: [id number] Visit Date: 02/06/2023  Subjective:    Chief Complaint  Patient presents with   Hyperlipidemia    Fasting - had 1/2 tsp of sugar in tea   Hyperlipidemia   Arthralgia of multiple joints Negative arthritis panel Minimal improvement with tylenol and mobic. She agreed to start cymbalta 20mg  daily. Consider use of gabapentin vs lyrica if no improvement with cymbalta or unable to tolerate med. F/up in 27month (video)  Elevated LFTs and hyperbilirubinemia Repeat hepatic panel  Mixed hyperlipidemia Repeat lipid panel  Reviewed medical, surgical, and social history today  Medications: Outpatient Medications Prior to Visit  Medication Sig   acetaminophen (TYLENOL) 500 MG tablet Take 500 mg by mouth every 6 (six) hours as needed for mild pain or moderate pain.   AREXVY 120 MCG/0.5ML injection    atorvastatin (LIPITOR) 20 MG tablet Take 1 tablet (20 mg total) by mouth 3 (three) times a week.   Calcium Carbonate-Vit D-Min (CALCIUM 1200 PO) Take 1 tablet by mouth daily.   Carboxymethylcellulose Sodium (THERATEARS) 0.25 % SOLN Place 1 drop into both eyes as needed (eye irritation).   cetirizine (ZYRTEC) 10 MG tablet Take 10 mg by mouth daily as needed for allergies.   COMIRNATY SUSP injection Inject into the muscle.   famotidine (PEPCID) 20 MG tablet TAKE 1 TABLET BY MOUTH EVERYDAY AT BEDTIME   meloxicam (MOBIC) 15 MG tablet Take 15 mg by mouth daily.   Multiple Vitamin (MULTIVITAMIN WITH MINERALS) TABS tablet Take 1 tablet by mouth daily with lunch.   omeprazole (PRILOSEC) 40 MG capsule TAKE 1 CAPSULE BY MOUTH EVERY DAY   ondansetron (ZOFRAN) 4 MG tablet Take 1 tablet (4 mg total) by mouth every 4-6 hours as needed for nausea   Probiotic Product (PROBIOTIC PO) Take 1 tablet by mouth daily.   ursodiol (ACTIGALL) 250 MG tablet Take 1 tablet (250 mg total) by mouth  2 (two) times daily.   [DISCONTINUED] FLUAD QUADRIVALENT 0.5 ML injection    Facility-Administered Medications Prior to Visit  Medication Dose Route Frequency Provider   0.9 %  sodium chloride infusion  500 mL Intravenous Continuous Cirigliano, Vito V, DO   Reviewed past medical and social history.   ROS per HPI above      Objective:  BP 112/62 (BP Location: Left Arm, Patient Position: Sitting, Cuff Size: Normal)   Pulse 82   Temp 97.9 F (36.6 C) (Temporal)   Resp 16   Ht 5\' 1"  (1.549 m)   Wt 132 lb 3.2 oz (60 kg)   SpO2 98%   BMI 24.98 kg/m      Physical Exam Cardiovascular:     Rate and Rhythm: Normal rate and regular rhythm.     Pulses: Normal pulses.     Heart sounds: Normal heart sounds.  Pulmonary:     Effort: Pulmonary effort is normal.     Breath sounds: Normal breath sounds.  Musculoskeletal:        General: No swelling or deformity.  Neurological:     Mental Status: She is alert and oriented to person, place, and time.     No results found for any visits on 02/06/23.    Assessment & Plan:    Problem List Items Addressed This Visit       Other   Arthralgia  of multiple joints    Negative arthritis panel Minimal improvement with tylenol and mobic. She agreed to start cymbalta 20mg  daily. Consider use of gabapentin vs lyrica if no improvement with cymbalta or unable to tolerate med. F/up in 39month (video)      Relevant Medications   DULoxetine (CYMBALTA) 20 MG capsule   Elevated LFTs and hyperbilirubinemia - Primary    Repeat hepatic panel      Relevant Orders   Hepatic function panel   Mixed hyperlipidemia    Repeat lipid panel      Relevant Orders   Lipid panel   Hepatic function panel   Other Visit Diagnoses     Myalgia       Relevant Medications   DULoxetine (CYMBALTA) 20 MG capsule      Return in about 4 weeks (around 03/06/2023) for generalized pain (video appt).     Alysia Penna, NP

## 2023-02-06 NOTE — Assessment & Plan Note (Signed)
Repeat hepatic panel ?

## 2023-02-08 DIAGNOSIS — G8918 Other acute postprocedural pain: Secondary | ICD-10-CM | POA: Diagnosis not present

## 2023-02-08 DIAGNOSIS — M75101 Unspecified rotator cuff tear or rupture of right shoulder, not specified as traumatic: Secondary | ICD-10-CM | POA: Diagnosis not present

## 2023-02-08 DIAGNOSIS — M19011 Primary osteoarthritis, right shoulder: Secondary | ICD-10-CM | POA: Diagnosis not present

## 2023-02-08 HISTORY — PX: TOTAL SHOULDER REPLACEMENT: SUR1217

## 2023-02-11 ENCOUNTER — Other Ambulatory Visit: Payer: Self-pay | Admitting: Gastroenterology

## 2023-02-11 DIAGNOSIS — R1013 Epigastric pain: Secondary | ICD-10-CM

## 2023-02-13 DIAGNOSIS — R6889 Other general symptoms and signs: Secondary | ICD-10-CM | POA: Diagnosis not present

## 2023-02-13 DIAGNOSIS — M19011 Primary osteoarthritis, right shoulder: Secondary | ICD-10-CM | POA: Diagnosis not present

## 2023-02-13 DIAGNOSIS — M25611 Stiffness of right shoulder, not elsewhere classified: Secondary | ICD-10-CM | POA: Diagnosis not present

## 2023-02-13 DIAGNOSIS — M6281 Muscle weakness (generalized): Secondary | ICD-10-CM | POA: Diagnosis not present

## 2023-02-15 ENCOUNTER — Encounter: Payer: Self-pay | Admitting: Nurse Practitioner

## 2023-02-15 ENCOUNTER — Ambulatory Visit: Payer: Medicare HMO | Admitting: Nurse Practitioner

## 2023-02-17 DIAGNOSIS — M19011 Primary osteoarthritis, right shoulder: Secondary | ICD-10-CM | POA: Diagnosis not present

## 2023-02-28 ENCOUNTER — Encounter: Payer: Self-pay | Admitting: Gastroenterology

## 2023-03-02 ENCOUNTER — Other Ambulatory Visit: Payer: Self-pay | Admitting: Nurse Practitioner

## 2023-03-02 DIAGNOSIS — M791 Myalgia, unspecified site: Secondary | ICD-10-CM

## 2023-03-02 DIAGNOSIS — M2550 Pain in unspecified joint: Secondary | ICD-10-CM

## 2023-03-16 DIAGNOSIS — M19011 Primary osteoarthritis, right shoulder: Secondary | ICD-10-CM | POA: Diagnosis not present

## 2023-03-16 DIAGNOSIS — R6889 Other general symptoms and signs: Secondary | ICD-10-CM | POA: Diagnosis not present

## 2023-03-23 DIAGNOSIS — M19011 Primary osteoarthritis, right shoulder: Secondary | ICD-10-CM | POA: Diagnosis not present

## 2023-03-23 DIAGNOSIS — M25611 Stiffness of right shoulder, not elsewhere classified: Secondary | ICD-10-CM | POA: Diagnosis not present

## 2023-03-23 DIAGNOSIS — M6281 Muscle weakness (generalized): Secondary | ICD-10-CM | POA: Diagnosis not present

## 2023-03-29 ENCOUNTER — Other Ambulatory Visit: Payer: Self-pay | Admitting: Nurse Practitioner

## 2023-03-29 DIAGNOSIS — E782 Mixed hyperlipidemia: Secondary | ICD-10-CM

## 2023-03-30 DIAGNOSIS — M25611 Stiffness of right shoulder, not elsewhere classified: Secondary | ICD-10-CM | POA: Diagnosis not present

## 2023-03-30 DIAGNOSIS — M6281 Muscle weakness (generalized): Secondary | ICD-10-CM | POA: Diagnosis not present

## 2023-03-30 DIAGNOSIS — M19011 Primary osteoarthritis, right shoulder: Secondary | ICD-10-CM | POA: Diagnosis not present

## 2023-04-03 DIAGNOSIS — M19011 Primary osteoarthritis, right shoulder: Secondary | ICD-10-CM | POA: Diagnosis not present

## 2023-04-03 DIAGNOSIS — M25611 Stiffness of right shoulder, not elsewhere classified: Secondary | ICD-10-CM | POA: Diagnosis not present

## 2023-04-03 DIAGNOSIS — M6281 Muscle weakness (generalized): Secondary | ICD-10-CM | POA: Diagnosis not present

## 2023-04-05 DIAGNOSIS — M6281 Muscle weakness (generalized): Secondary | ICD-10-CM | POA: Diagnosis not present

## 2023-04-05 DIAGNOSIS — M19011 Primary osteoarthritis, right shoulder: Secondary | ICD-10-CM | POA: Diagnosis not present

## 2023-04-05 DIAGNOSIS — M25611 Stiffness of right shoulder, not elsewhere classified: Secondary | ICD-10-CM | POA: Diagnosis not present

## 2023-04-11 ENCOUNTER — Other Ambulatory Visit: Payer: Self-pay | Admitting: Gastroenterology

## 2023-04-12 DIAGNOSIS — M25611 Stiffness of right shoulder, not elsewhere classified: Secondary | ICD-10-CM | POA: Diagnosis not present

## 2023-04-12 DIAGNOSIS — M6281 Muscle weakness (generalized): Secondary | ICD-10-CM | POA: Diagnosis not present

## 2023-04-12 DIAGNOSIS — M19011 Primary osteoarthritis, right shoulder: Secondary | ICD-10-CM | POA: Diagnosis not present

## 2023-04-14 DIAGNOSIS — M19011 Primary osteoarthritis, right shoulder: Secondary | ICD-10-CM | POA: Diagnosis not present

## 2023-04-14 DIAGNOSIS — M6281 Muscle weakness (generalized): Secondary | ICD-10-CM | POA: Diagnosis not present

## 2023-04-14 DIAGNOSIS — M25611 Stiffness of right shoulder, not elsewhere classified: Secondary | ICD-10-CM | POA: Diagnosis not present

## 2023-04-20 ENCOUNTER — Other Ambulatory Visit: Payer: Self-pay | Admitting: Gastroenterology

## 2023-04-26 DIAGNOSIS — M19011 Primary osteoarthritis, right shoulder: Secondary | ICD-10-CM | POA: Diagnosis not present

## 2023-04-26 DIAGNOSIS — M25611 Stiffness of right shoulder, not elsewhere classified: Secondary | ICD-10-CM | POA: Diagnosis not present

## 2023-04-26 DIAGNOSIS — M6281 Muscle weakness (generalized): Secondary | ICD-10-CM | POA: Diagnosis not present

## 2023-04-28 DIAGNOSIS — M25611 Stiffness of right shoulder, not elsewhere classified: Secondary | ICD-10-CM | POA: Diagnosis not present

## 2023-04-28 DIAGNOSIS — M19011 Primary osteoarthritis, right shoulder: Secondary | ICD-10-CM | POA: Diagnosis not present

## 2023-04-28 DIAGNOSIS — M6281 Muscle weakness (generalized): Secondary | ICD-10-CM | POA: Diagnosis not present

## 2023-05-01 DIAGNOSIS — M25611 Stiffness of right shoulder, not elsewhere classified: Secondary | ICD-10-CM | POA: Diagnosis not present

## 2023-05-01 DIAGNOSIS — M6281 Muscle weakness (generalized): Secondary | ICD-10-CM | POA: Diagnosis not present

## 2023-05-01 DIAGNOSIS — M19011 Primary osteoarthritis, right shoulder: Secondary | ICD-10-CM | POA: Diagnosis not present

## 2023-05-03 DIAGNOSIS — M6281 Muscle weakness (generalized): Secondary | ICD-10-CM | POA: Diagnosis not present

## 2023-05-03 DIAGNOSIS — M25611 Stiffness of right shoulder, not elsewhere classified: Secondary | ICD-10-CM | POA: Diagnosis not present

## 2023-05-03 DIAGNOSIS — M19011 Primary osteoarthritis, right shoulder: Secondary | ICD-10-CM | POA: Diagnosis not present

## 2023-05-06 IMAGING — US US ABDOMEN LIMITED
1 series · 15 of 25 positions shown · non-contrast
Comparison: MRI February 18, 2020.

CLINICAL DATA: Concern for choledocholithiasis.

EXAM:
ULTRASOUND ABDOMEN LIMITED RIGHT UPPER QUADRANT

[Series 1: us abdomen limited ruq mc & wl · 15 of 40 slices shown]
[im 1/40]
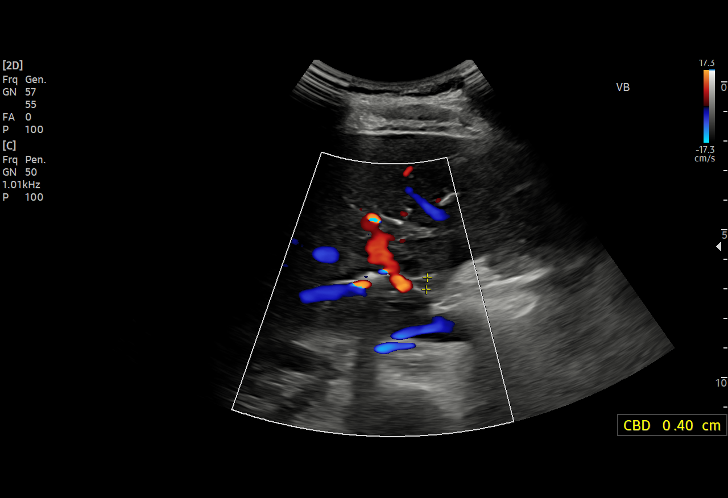
[im 4/40]
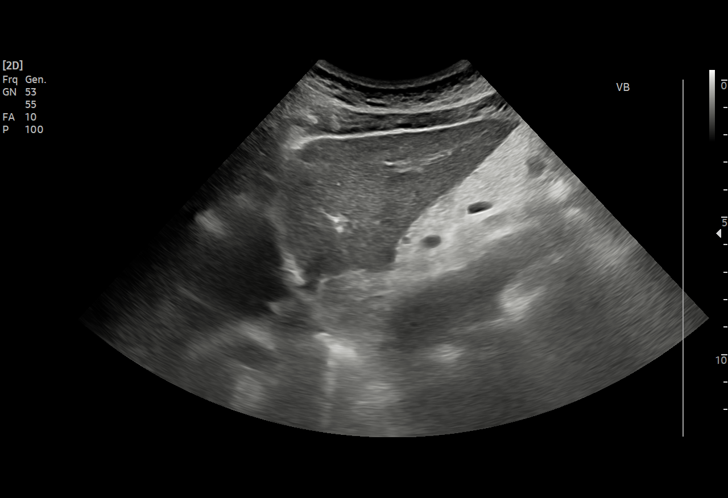
[im 7/40]
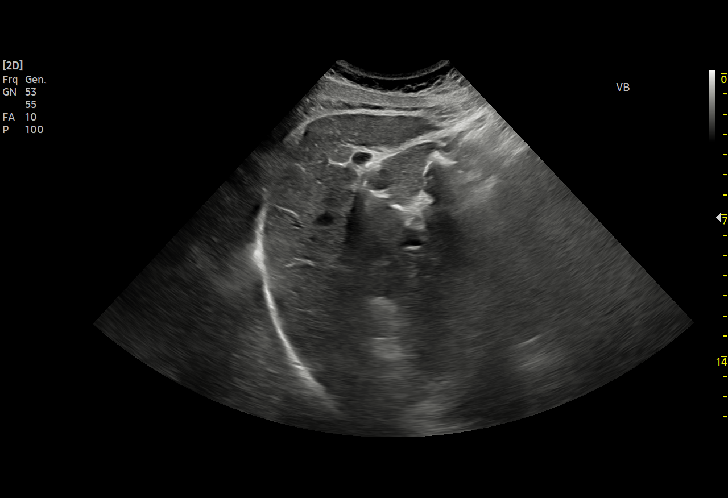
[im 9/40]
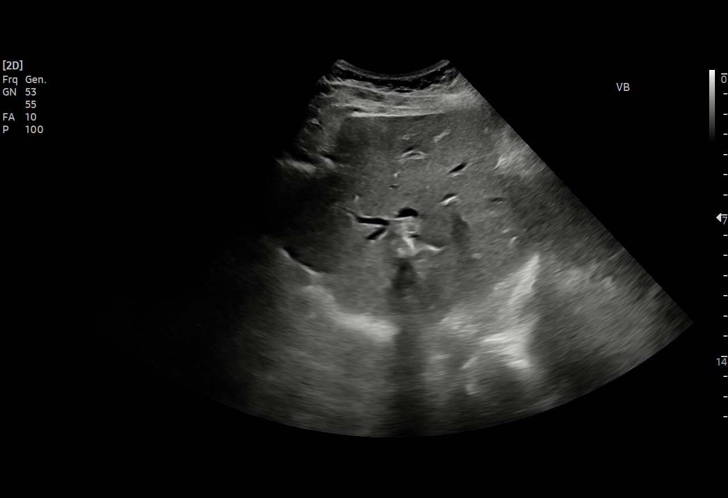
[im 12/40]
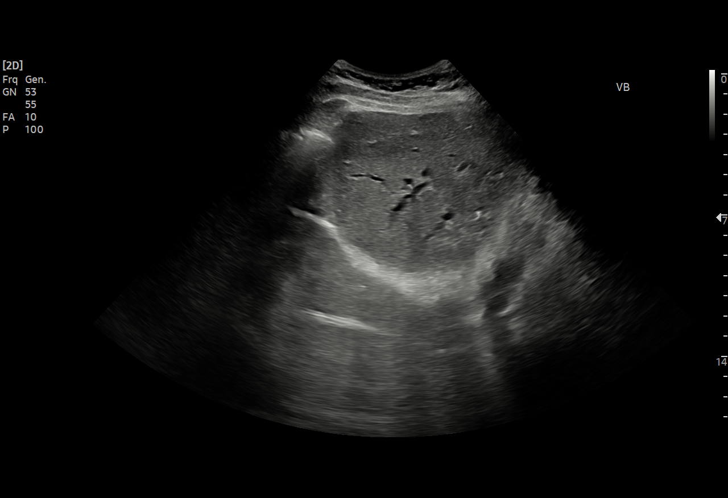
[im 15/40]
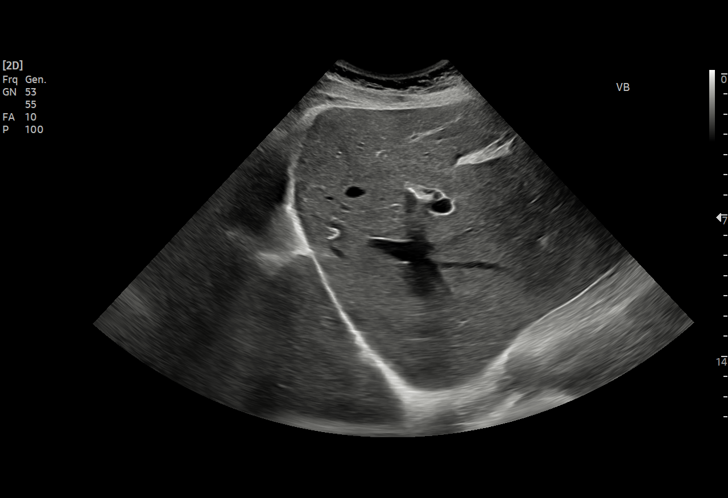
[im 17/40]
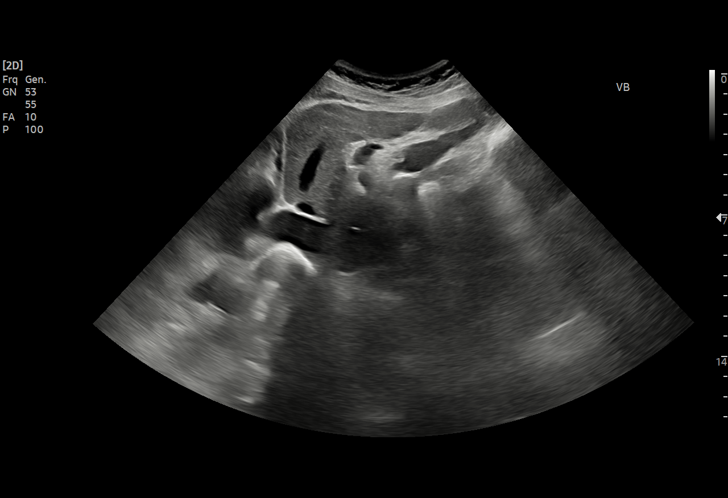
[im 20/40]
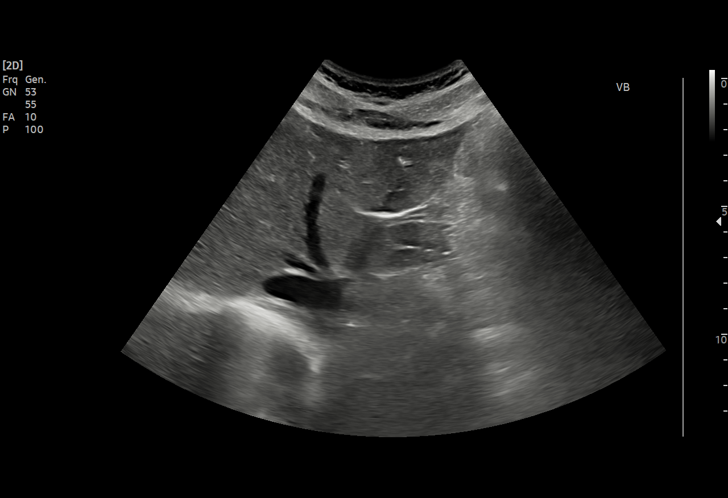
[im 23/40]
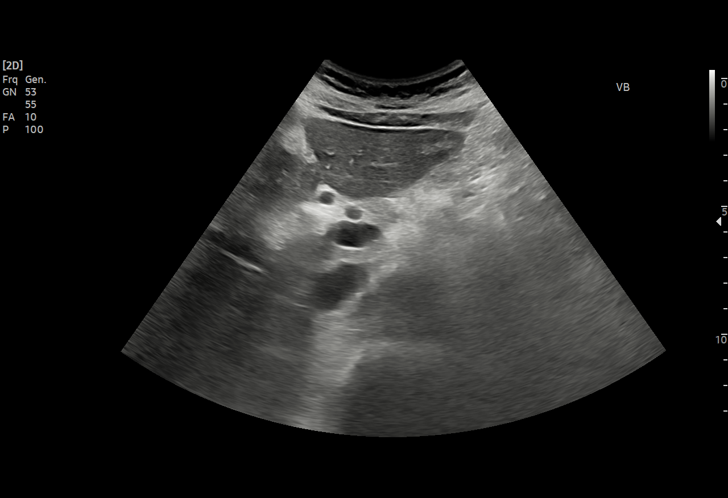
[im 25/40]
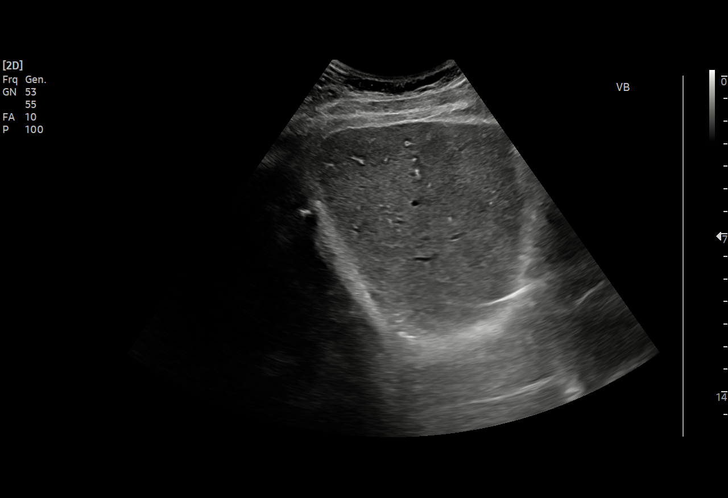
[im 28/40]
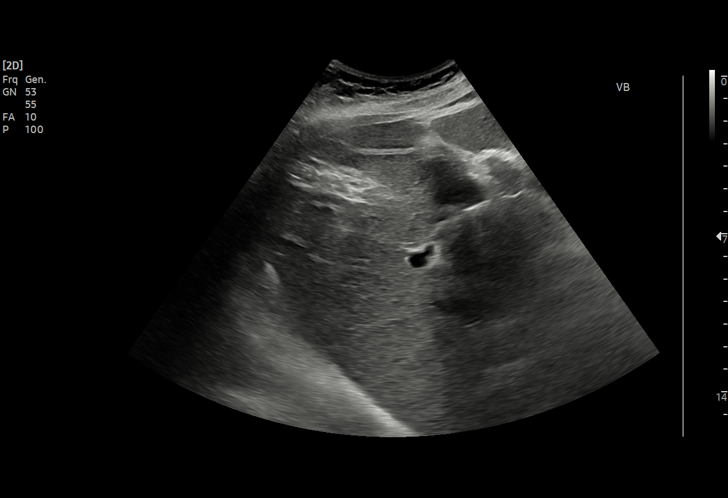
[im 31/40]
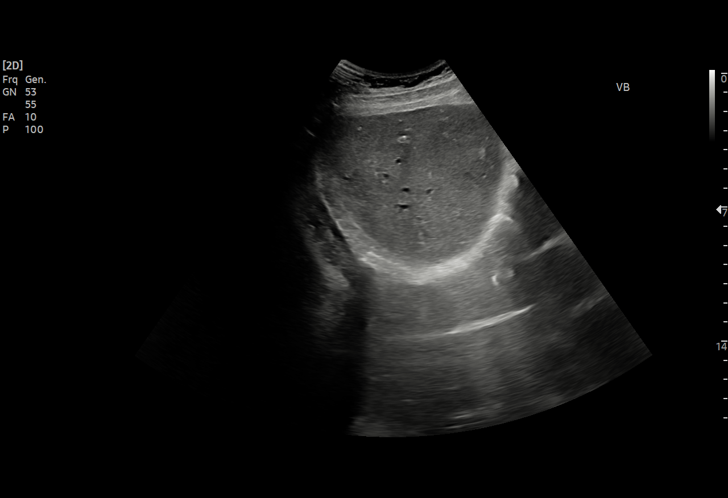
[im 33/40]
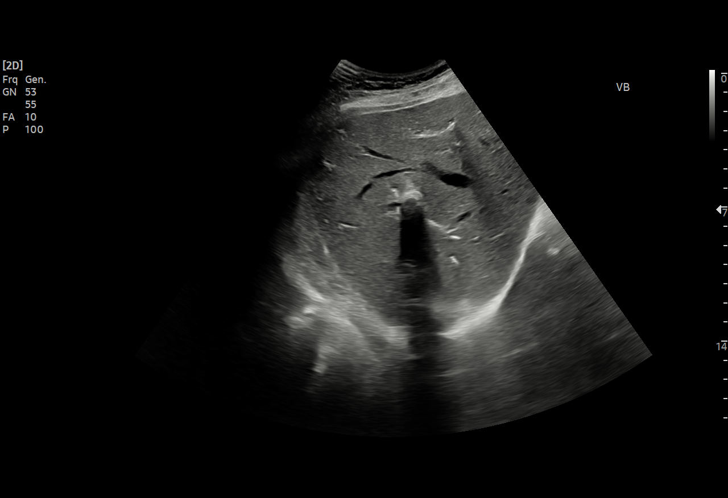
[im 36/40]
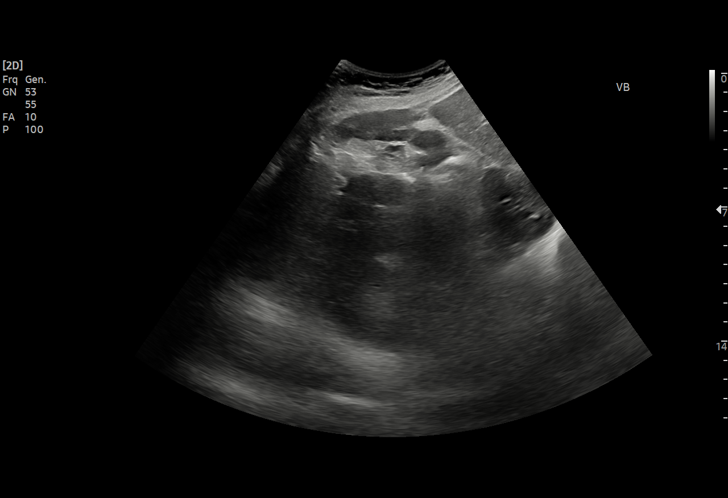
[im 40/40]
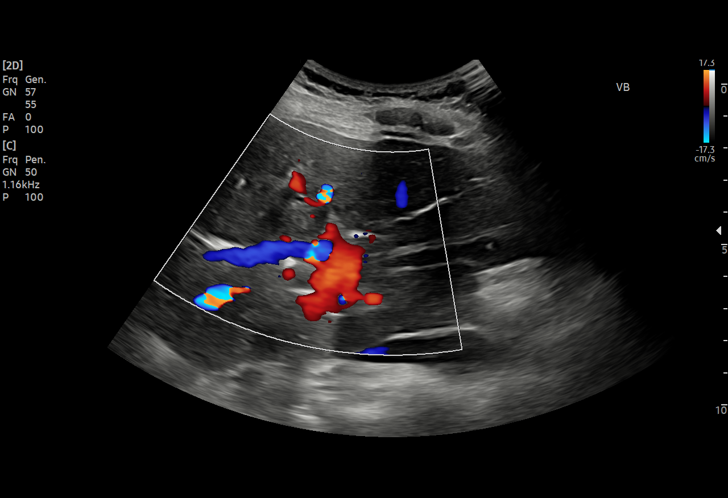

[15 of 25 positions shown; findings below may reference images not displayed]

FINDINGS: Gallbladder:

Surgically absent

Common bile duct:

Diameter: 4 mm

Liver:

No focal lesion identified. Within normal limits in parenchymal
echogenicity. Intrahepatic hepatic biliary ductal dilation, similar
to prior. Portal vein is patent on color Doppler imaging with normal
direction of blood flow towards the liver.

Other: None.
IMPRESSION: 1. Status post cholecystectomy with chronic intrahepatic hepatic
biliary ductal dilation, similar to prior. No sonographic evidence
of choledocholithiasis.

## 2023-05-09 DIAGNOSIS — M19011 Primary osteoarthritis, right shoulder: Secondary | ICD-10-CM | POA: Diagnosis not present

## 2023-05-10 DIAGNOSIS — M19011 Primary osteoarthritis, right shoulder: Secondary | ICD-10-CM | POA: Diagnosis not present

## 2023-05-10 DIAGNOSIS — M25611 Stiffness of right shoulder, not elsewhere classified: Secondary | ICD-10-CM | POA: Diagnosis not present

## 2023-05-10 DIAGNOSIS — M6281 Muscle weakness (generalized): Secondary | ICD-10-CM | POA: Diagnosis not present

## 2023-05-16 DIAGNOSIS — M6281 Muscle weakness (generalized): Secondary | ICD-10-CM | POA: Diagnosis not present

## 2023-05-16 DIAGNOSIS — M25611 Stiffness of right shoulder, not elsewhere classified: Secondary | ICD-10-CM | POA: Diagnosis not present

## 2023-05-16 DIAGNOSIS — M19011 Primary osteoarthritis, right shoulder: Secondary | ICD-10-CM | POA: Diagnosis not present

## 2023-05-18 DIAGNOSIS — M25611 Stiffness of right shoulder, not elsewhere classified: Secondary | ICD-10-CM | POA: Diagnosis not present

## 2023-05-18 DIAGNOSIS — M6281 Muscle weakness (generalized): Secondary | ICD-10-CM | POA: Diagnosis not present

## 2023-05-18 DIAGNOSIS — M19011 Primary osteoarthritis, right shoulder: Secondary | ICD-10-CM | POA: Diagnosis not present

## 2023-05-22 DIAGNOSIS — M25611 Stiffness of right shoulder, not elsewhere classified: Secondary | ICD-10-CM | POA: Diagnosis not present

## 2023-05-22 DIAGNOSIS — M19011 Primary osteoarthritis, right shoulder: Secondary | ICD-10-CM | POA: Diagnosis not present

## 2023-05-22 DIAGNOSIS — M6281 Muscle weakness (generalized): Secondary | ICD-10-CM | POA: Diagnosis not present

## 2023-05-24 DIAGNOSIS — M25611 Stiffness of right shoulder, not elsewhere classified: Secondary | ICD-10-CM | POA: Diagnosis not present

## 2023-05-24 DIAGNOSIS — M19011 Primary osteoarthritis, right shoulder: Secondary | ICD-10-CM | POA: Diagnosis not present

## 2023-05-24 DIAGNOSIS — M6281 Muscle weakness (generalized): Secondary | ICD-10-CM | POA: Diagnosis not present

## 2023-06-01 DIAGNOSIS — M6281 Muscle weakness (generalized): Secondary | ICD-10-CM | POA: Diagnosis not present

## 2023-06-01 DIAGNOSIS — M25611 Stiffness of right shoulder, not elsewhere classified: Secondary | ICD-10-CM | POA: Diagnosis not present

## 2023-06-01 DIAGNOSIS — M19011 Primary osteoarthritis, right shoulder: Secondary | ICD-10-CM | POA: Diagnosis not present

## 2023-06-05 ENCOUNTER — Encounter: Payer: Self-pay | Admitting: Gastroenterology

## 2023-06-06 DIAGNOSIS — M6281 Muscle weakness (generalized): Secondary | ICD-10-CM | POA: Diagnosis not present

## 2023-06-06 DIAGNOSIS — M25611 Stiffness of right shoulder, not elsewhere classified: Secondary | ICD-10-CM | POA: Diagnosis not present

## 2023-06-06 DIAGNOSIS — M19011 Primary osteoarthritis, right shoulder: Secondary | ICD-10-CM | POA: Diagnosis not present

## 2023-06-08 DIAGNOSIS — M25611 Stiffness of right shoulder, not elsewhere classified: Secondary | ICD-10-CM | POA: Diagnosis not present

## 2023-06-08 DIAGNOSIS — M6281 Muscle weakness (generalized): Secondary | ICD-10-CM | POA: Diagnosis not present

## 2023-06-08 DIAGNOSIS — M19011 Primary osteoarthritis, right shoulder: Secondary | ICD-10-CM | POA: Diagnosis not present

## 2023-06-12 ENCOUNTER — Ambulatory Visit (INDEPENDENT_AMBULATORY_CARE_PROVIDER_SITE_OTHER): Payer: Medicare HMO

## 2023-06-12 DIAGNOSIS — Z0000 Encounter for general adult medical examination without abnormal findings: Secondary | ICD-10-CM

## 2023-06-12 NOTE — Patient Instructions (Signed)
Jasmine Bradford , Thank you for taking time to come for your Medicare Wellness Visit. I appreciate your ongoing commitment to your health goals. Please review the following plan we discussed and let me know if I can assist you in the future.   Referrals/Orders/Follow-Ups/Clinician Recommendations: none  This is a list of the screening recommended for you and due dates:  Health Maintenance  Topic Date Due   Mammogram  02/05/2023   Flu Shot  03/23/2023   COVID-19 Vaccine (5 - 2023-24 season) 04/23/2023   Medicare Annual Wellness Visit  06/11/2024   DTaP/Tdap/Td vaccine (2 - Td or Tdap) 02/09/2025   Colon Cancer Screening  12/08/2029   Pneumonia Vaccine  Completed   DEXA scan (bone density measurement)  Completed   Hepatitis C Screening  Completed   Zoster (Shingles) Vaccine  Completed   HPV Vaccine  Aged Out    Advanced directives: (ACP Link)Information on Advanced Care Planning can be found at [facility] Advance Health Care Directives Advance Health Care Directives ([email or web address]   Next Medicare Annual Wellness Visit scheduled for next year: Yes  Insert Preventive Care attachment Insert FALL PREVENTION attachment if needed

## 2023-06-12 NOTE — Progress Notes (Signed)
Subjective:   Jasmine Bradford is a 67 y.o. female who presents for Medicare Annual (Subsequent) preventive examination.  Visit Complete: Virtual I connected with  Jasmine Bradford on 06/12/23 by a audio enabled telemedicine application and verified that I am speaking with the correct person using two identifiers.  Patient Location: Home  Provider Location: Office/Clinic  I discussed the limitations of evaluation and management by telemedicine. The patient expressed understanding and agreed to proceed.  Vital Signs: Because this visit was a virtual/telehealth visit, some criteria may be missing or patient reported. Any vitals not documented were not able to be obtained and vitals that have been documented are patient reported.  Patient Medicare AWV questionnaire was completed by the patient on 06/05/2023; I have confirmed that all information answered by patient is correct and no changes since this date.  Cardiac Risk Factors include: advanced age (>39men, >35 women);dyslipidemia     Objective:    Today's Vitals   06/12/23 1041  PainSc: 5    There is no height or weight on file to calculate BMI.     06/12/2023   10:48 AM 06/07/2022    2:50 PM 12/09/2021    6:52 AM 12/08/2021    7:56 AM 11/19/2021   12:10 PM 10/15/2021    8:00 AM 10/14/2021    4:20 AM  Advanced Directives  Does Patient Have a Medical Advance Directive? No Yes No No No No No  Type of Furniture conservator/restorer;Living will       Copy of Healthcare Power of Attorney in Chart?  No - copy requested       Would patient like information on creating a medical advance directive?     Yes (MAU/Ambulatory/Procedural Areas - Information given) No - Patient declined     Current Medications (verified) Outpatient Encounter Medications as of 06/12/2023  Medication Sig   acetaminophen (TYLENOL) 500 MG tablet Take 500 mg by mouth every 6 (six) hours as needed for mild pain or moderate pain.    atorvastatin (LIPITOR) 20 MG tablet TAKE 1 TABLET BY MOUTH 3 TIMES A WEEK.   Calcium Carbonate-Vit D-Min (CALCIUM 1200 PO) Take 1 tablet by mouth daily.   Carboxymethylcellulose Sodium (THERATEARS) 0.25 % SOLN Place 1 drop into both eyes as needed (eye irritation).   cetirizine (ZYRTEC) 10 MG tablet Take 10 mg by mouth daily as needed for allergies.   famotidine (PEPCID) 20 MG tablet TAKE 1 TABLET BY MOUTH EVERYDAY AT BEDTIME   Multiple Vitamin (MULTIVITAMIN WITH MINERALS) TABS tablet Take 1 tablet by mouth daily with lunch.   Probiotic Product (PROBIOTIC PO) Take 1 tablet by mouth daily.   ursodiol (ACTIGALL) 250 MG tablet TAKE 1 TABLET BY MOUTH TWICE A DAY   AREXVY 120 MCG/0.5ML injection    COMIRNATY SUSP injection Inject into the muscle.   DULoxetine (CYMBALTA) 20 MG capsule TAKE 1 CAPSULE BY MOUTH EVERY DAY   meloxicam (MOBIC) 15 MG tablet Take 15 mg by mouth daily.   omeprazole (PRILOSEC) 40 MG capsule TAKE 1 CAPSULE BY MOUTH EVERY DAY (Patient not taking: Reported on 06/12/2023)   ondansetron (ZOFRAN) 4 MG tablet Take 1 tablet (4 mg total) by mouth every 4-6 hours as needed for nausea (Patient not taking: Reported on 06/12/2023)   Facility-Administered Encounter Medications as of 06/12/2023  Medication   0.9 %  sodium chloride infusion    Allergies (verified) Oxycodone, Potassium, and Tape   History: Past Medical History:  Diagnosis Date  Allergy    Cholelithiasis with choledocholithiasis    Diverticulitis    Epigastric abdominal pain with nausea and vomiting 08/20/2021   GERD (gastroesophageal reflux disease)    Mixed hyperlipidemia 10/06/2021   Tobacco abuse    Varicose vein of leg    no problems now   Wears glasses    Past Surgical History:  Procedure Laterality Date   BILIARY BRUSHING  12/09/2021   Procedure: BILIARY BRUSHING;  Surgeon: Lemar Lofty., MD;  Location: [facility] ENDOSCOPY;  Service: Gastroenterology;;   BILIARY DILATION  02/19/2020   Procedure:  BILIARY DILATION;  Surgeon: Lynann Bologna, MD;  Location: [facility] ENDOSCOPY;  Service: Endoscopy;;   BILIARY DILATION  10/15/2021   Procedure: BILIARY DILATION;  Surgeon: Lynann Bologna, MD;  Location: [facility] ENDOSCOPY;  Service: Gastroenterology;;   BILIARY DILATION  12/09/2021   Procedure: BILIARY DILATION;  Surgeon: Lemar Lofty., MD;  Location: [facility] ENDOSCOPY;  Service: Gastroenterology;;   BILIARY STENT PLACEMENT  10/15/2021   Procedure: BILIARY STENT PLACEMENT;  Surgeon: Lynann Bologna, MD;  Location: [facility] ENDOSCOPY;  Service: Gastroenterology;;   CESAREAN SECTION     CHOLECYSTECTOMY N/A 05/23/2016   Procedure: LAPAROSCOPIC CHOLECYSTECTOMY WITH INTRAOPERATIVE CHOLANGIOGRAM;  Surgeon: Avel Peace, MD;  Location: [facility] OR;  Service: General;  Laterality: N/A;   ENDOSCOPIC RETROGRADE CHOLANGIOPANCREATOGRAPHY (ERCP) WITH PROPOFOL N/A 06/06/2016   Procedure: ENDOSCOPIC RETROGRADE CHOLANGIOPANCREATOGRAPHY (ERCP) WITH PROPOFOL;  Surgeon: Vida Rigger, MD;  Location: [facility] ENDOSCOPY;  Service: Endoscopy;  Laterality: N/A;   ENDOSCOPIC RETROGRADE CHOLANGIOPANCREATOGRAPHY (ERCP) WITH PROPOFOL N/A 02/19/2020   Procedure: ENDOSCOPIC RETROGRADE CHOLANGIOPANCREATOGRAPHY (ERCP) WITH PROPOFOL;  Surgeon: Lynann Bologna, MD;  Location: [facility] ENDOSCOPY;  Service: Endoscopy;  Laterality: N/A;   ENDOSCOPIC RETROGRADE CHOLANGIOPANCREATOGRAPHY (ERCP) WITH PROPOFOL N/A 10/15/2021   Procedure: ENDOSCOPIC RETROGRADE CHOLANGIOPANCREATOGRAPHY (ERCP) WITH PROPOFOL;  Surgeon: Lynann Bologna, MD;  Location: [facility] ENDOSCOPY;  Service: Gastroenterology;  Laterality: N/A;   ENDOSCOPIC RETROGRADE CHOLANGIOPANCREATOGRAPHY (ERCP) WITH PROPOFOL N/A 12/09/2021   Procedure: ENDOSCOPIC RETROGRADE CHOLANGIOPANCREATOGRAPHY (ERCP) WITH PROPOFOL;  Surgeon: Meridee Score Netty Starring., MD;  Location: [facility] ENDOSCOPY;  Service: Gastroenterology;  Laterality: N/A;   ERCP N/A 03/09/2016   Procedure: ENDOSCOPIC RETROGRADE CHOLANGIOPANCREATOGRAPHY (ERCP);  Surgeon:  Vida Rigger, MD;  Location: [facility] ENDOSCOPY;  Service: Endoscopy;  Laterality: N/A;   ERCP N/A 06/12/2018   Procedure: ENDOSCOPIC RETROGRADE CHOLANGIOPANCREATOGRAPHY (ERCP);  Surgeon: Vida Rigger, MD;  Location: [facility] ENDOSCOPY;  Service: Endoscopy;  Laterality: N/A;   ESOPHAGOGASTRODUODENOSCOPY N/A 06/12/2018   Procedure: ESOPHAGOGASTRODUODENOSCOPY (EGD);  Surgeon: Vida Rigger, MD;  Location: [facility] ENDOSCOPY;  Service: Endoscopy;  Laterality: N/A;   laser vein surgery     last year   LITHOTRIPSY  12/09/2021   Procedure: LITHOTRIPSY;  Surgeon: Mansouraty, Netty Starring., MD;  Location: [facility] ENDOSCOPY;  Service: Gastroenterology;;   REMOVAL OF STONES  06/12/2018   Procedure: REMOVAL OF STONES;  Surgeon: Vida Rigger, MD;  Location: [facility] ENDOSCOPY;  Service: Endoscopy;;   REMOVAL OF STONES  02/19/2020   Procedure: REMOVAL OF STONES;  Surgeon: Lynann Bologna, MD;  Location: [facility] ENDOSCOPY;  Service: Endoscopy;;   REMOVAL OF STONES  10/15/2021   Procedure: REMOVAL OF STONES;  Surgeon: Lynann Bologna, MD;  Location: [facility] ENDOSCOPY;  Service: Gastroenterology;;   REMOVAL OF STONES  12/09/2021   Procedure: REMOVAL OF STONES;  Surgeon: Lemar Lofty., MD;  Location: [facility] ENDOSCOPY;  Service: Gastroenterology;;   ROBOTIC ASSISTED SALPINGO OOPHERECTOMY Bilateral 12/21/2021   Procedure: XI ROBOTIC ASSISTED SALPINGO OOPHORECTOMY;  Surgeon: Carver Fila, MD;  Location: [facility] ORS;  Service: Gynecology;  Laterality: Bilateral;   SPHINCTEROTOMY  06/12/2018   Procedure: SPHINCTEROTOMY;  Surgeon: Vida Rigger, MD;  Location: [facility] ENDOSCOPY;  Service: Endoscopy;;   SPHINCTEROTOMY  02/19/2020   Procedure: Dennison Mascot;  Surgeon: Lynann Bologna, MD;  Location: [facility] ENDOSCOPY;  Service: Endoscopy;;   SPYGLASS CHOLANGIOSCOPY N/A 10/15/2021   Procedure: [id number] CHOLANGIOSCOPY;  Surgeon: Lynann Bologna, MD;  Location: [facility] ENDOSCOPY;  Service: Gastroenterology;  Laterality: N/A;   SPYGLASS CHOLANGIOSCOPY N/A 12/09/2021   Procedure:  SPYGLASS CHOLANGIOSCOPY;  Surgeon: Lemar Lofty., MD;  Location: [facility] ENDOSCOPY;  Service: Gastroenterology;  Laterality: N/A;   SPYGLASS LITHOTRIPSY N/A 10/15/2021   Procedure: [id number] LITHOTRIPSY;  Surgeon: Lynann Bologna, MD;  Location: [facility] ENDOSCOPY;  Service: Gastroenterology;  Laterality: N/A;   STENT REMOVAL  12/09/2021   Procedure: STENT REMOVAL;  Surgeon: Meridee Score Netty Starring., MD;  Location: [facility] ENDOSCOPY;  Service: Gastroenterology;;   TOTAL SHOULDER REPLACEMENT Right 02/08/2023   TUBAL LIGATION     at time of c-section   VARICOSE VEIN SURGERY Left    Family History  Problem Relation Age of Onset   Memory loss Mother    Ovarian cancer Mother    Colon cancer Neg Hx    Breast cancer Neg Hx    Endometrial cancer Neg Hx    Pancreatic cancer Neg Hx    Prostate cancer Neg Hx    Social History   Socioeconomic History   Marital status: Widowed    Spouse name: Riley Lam   Number of children: 4   Years of education: Not on file   Highest education level: 12th grade  Occupational History   Not on file  Tobacco Use   Smoking status: Former    Current packs/day: 0.50    Average packs/day: 0.5 packs/day for 35.0 years (17.5 ttl pk-yrs)    Types: Cigarettes   Smokeless tobacco: Never   Tobacco comments:    No smoking since 02/2016  Vaping Use   Vaping status: Never Used  Substance and Sexual Activity   Alcohol use: No   Drug use: Never   Sexual activity: Not Currently    Partners: Male    Birth control/protection: Post-menopausal, Surgical    Comment: BTL  Other Topics Concern   Not on file  Social History Narrative   Not on file   Social Determinants of Health   Financial Resource Strain: Low Risk  (06/05/2023)   Overall Financial Resource Strain (CARDIA)    Difficulty of Paying Living Expenses: Not hard at all  Food Insecurity: No Food Insecurity (06/05/2023)   Hunger Vital Sign    Worried About Running Out of Food in the Last Year: Never true    Ran Out  of Food in the Last Year: Never true  Transportation Needs: No Transportation Needs (06/05/2023)   PRAPARE - Administrator, Civil Service (Medical): No    Lack of Transportation (Non-Medical): No  Physical Activity: Insufficiently Active (06/05/2023)   Exercise Vital Sign    Days of Exercise per Week: 4 days    Minutes of Exercise per Session: 30 min  Stress: No Stress Concern Present (06/05/2023)   [employer] of Occupational Health - Occupational Stress Questionnaire    Feeling of Stress : Only a little  Social Connections: Socially Isolated (06/05/2023)   Social Connection and Isolation Panel [NHANES]    Frequency of Communication with Friends and Family: More than three times a week    Frequency of Social Gatherings with Friends and Family: Once a week  Attends Religious Services: Never    Active Member of Clubs or Organizations: No    Attends Banker Meetings: Never    Marital Status: Widowed    Tobacco Counseling Counseling given: Not Answered Tobacco comments: No smoking since 02/2016   Clinical Intake:  Pre-visit preparation completed: Yes  Pain : 0-10 Pain Score: 5  Pain Type: Chronic pain Pain Location: Other (Comment) (IT band) Pain Onset: More than a month ago Pain Frequency: Constant     Nutritional Risks: Nausea/ vomitting/ diarrhea (nausea and diarrhea, medicine changed) Diabetes: No  How often do you need to have someone help you when you read instructions, pamphlets, or other written materials from your doctor or pharmacy?: 1 - Never  Interpreter Needed?: No  Information entered by :: NAllen LPN   Activities of Daily Living    06/05/2023    8:09 AM  In your present state of health, do you have any difficulty performing the following activities:  Hearing? 0  Vision? 0  Difficulty concentrating or making decisions? 0  Walking or climbing stairs? 0  Dressing or bathing? 0  Doing errands, shopping? 0  Preparing  Food and eating ? N  Using the Toilet? N  In the past six months, have you accidently leaked urine? N  Do you have problems with loss of bowel control? N  Managing your Medications? N  Managing your Finances? N  Housekeeping or managing your Housekeeping? N    Patient Care Team: Nche, Bonna Gains, NP as PCP - General (Internal Medicine)  Indicate any recent Medical Services you may have received from other than [facility] providers in the past year (date may be approximate).     Assessment:   This is a routine wellness examination for [place].  Hearing/Vision screen Hearing Screening - Comments:: Denies hearing issues Vision Screening - Comments:: Regular eye exams, [facility]   Goals Addressed             This Visit's Progress    Patient Stated       06/12/2023, get IT band to stop hurting       Depression Screen    06/12/2023   10:49 AM 02/06/2023    9:11 AM 10/24/2022   12:04 PM 09/05/2022    9:45 AM 06/07/2022    2:49 PM 11/19/2021    1:14 PM 10/21/2021    9:36 AM  PHQ 2/9 Scores  PHQ - 2 Score 0 1 1 0 0 0 0  PHQ- 9 Score 0 3 3   2  0    Fall Risk    06/05/2023    8:09 AM 02/06/2023    8:44 AM 10/24/2022   12:05 PM 06/07/2022    2:45 PM 06/06/2022    4:33 PM  Fall Risk   Falls in the past year? 0 0 1 0 0  Number falls in past yr: 0 0 0 0 0  Injury with Fall? 0 0 0 0 0  Risk for fall due to : Medication side effect No Fall Risks No Fall Risks No Fall Risks   Follow up Falls prevention discussed;Falls evaluation completed Falls evaluation completed Falls evaluation completed Falls prevention discussed     MEDICARE RISK AT HOME: Medicare Risk at Home Any stairs in or around the home?: Yes If so, are there any without handrails?: No Home free of loose throw rugs in walkways, pet beds, electrical cords, etc?: Yes Adequate lighting in your home to reduce risk of falls?: Yes Life alert?:  No Use of a cane, walker or w/c?: Yes Grab bars in the bathroom?: No Shower  chair or bench in shower?: No Elevated toilet seat or a handicapped toilet?: No  TIMED UP AND GO:  Was the test performed?  No    Cognitive Function:        06/12/2023   10:50 AM 06/07/2022    2:51 PM 05/15/2021    9:39 AM  6CIT Screen  What Year? 0 points 0 points 0 points  What month? 0 points 0 points 0 points  What time? 0 points 0 points 0 points  Count back from 20 0 points 0 points 0 points  Months in reverse 0 points 0 points 0 points  Repeat phrase 0 points 0 points 0 points  Total Score 0 points 0 points 0 points    Immunizations Immunization History  Administered Date(s) Administered   Fluad Quad(high Dose 65+) 04/25/2020   Influenza, Quadrivalent, Recombinant, Inj, Pf 06/09/2019   Influenza,inj,Quad PF,6+ Mos 06/07/2016, 05/27/2018   Influenza-Unspecified 05/31/2021   PFIZER(Purple Top)SARS-COV-2 Vaccination 10/13/2019, 11/04/2019, 06/13/2020, 01/10/2021   Pneumococcal Conjugate-13 06/09/2019   Pneumococcal Polysaccharide-23 06/20/2020   Tdap 02/10/2015   Zoster Recombinant(Shingrix) 10/19/2017, 02/06/2018    TDAP status: Up to date  Flu Vaccine status: Up to date  Pneumococcal vaccine status: Up to date  Covid-19 vaccine status: Completed vaccines  Qualifies for Shingles Vaccine? Yes   Zostavax completed Yes   Shingrix Completed?: Yes  Screening Tests Health Maintenance  Topic Date Due   MAMMOGRAM  02/05/2023   INFLUENZA VACCINE  03/23/2023   COVID-19 Vaccine (5 - 2023-24 season) 04/23/2023   Medicare Annual Wellness (AWV)  06/11/2024   DTaP/Tdap/Td (2 - Td or Tdap) 02/09/2025   Colonoscopy  12/08/2029   Pneumonia Vaccine 55+ Years old  Completed   DEXA SCAN  Completed   Hepatitis C Screening  Completed   Zoster Vaccines- Shingrix  Completed   HPV VACCINES  Aged Out    Health Maintenance  Health Maintenance Due  Topic Date Due   MAMMOGRAM  02/05/2023   INFLUENZA VACCINE  03/23/2023   COVID-19 Vaccine (5 - 2023-24 season) 04/23/2023     Colorectal cancer screening: Type of screening: Colonoscopy. Completed 12/09/2022. Repeat every 7 years  Mammogram status: Completed 02/04/2022. Repeat every year  Bone Density status: Completed 02/05/2021.   Lung Cancer Screening: (Low Dose CT Chest recommended if Age 64-80 years, 20 pack-year currently smoking OR have quit w/in 15years.) does not qualify.   Lung Cancer Screening Referral: no  Additional Screening:  Hepatitis C Screening: does qualify; Completed 02/18/2020  Vision Screening: Recommended annual ophthalmology exams for early detection of glaucoma and other disorders of the eye. Is the patient up to date with their annual eye exam?  Yes  Who is the provider or what is the name of the office in which the patient attends annual eye exams? [facility] If pt is not established with a provider, would they like to be referred to a provider to establish care? No .   Dental Screening: Recommended annual dental exams for proper oral hygiene  Diabetic Foot Exam: n/a  Community Resource Referral / Chronic Care Management: CRR required this visit?  No   CCM required this visit?  No     Plan:     I have personally reviewed and noted the following in the patient's chart:   Medical and social history Use of alcohol, tobacco or illicit drugs  Current medications and supplements including opioid  prescriptions. Patient is not currently taking opioid prescriptions. Functional ability and status Nutritional status Physical activity Advanced directives List of other physicians Hospitalizations, surgeries, and ER visits in previous 12 months Vitals Screenings to include cognitive, depression, and falls Referrals and appointments  In addition, I have reviewed and discussed with patient certain preventive protocols, quality metrics, and best practice recommendations. A written personalized care plan for preventive services as well as general preventive health recommendations were  provided to patient.     Barb Merino, LPN   16/05/9603   After Visit Summary: (MyChart) Due to this being a telephonic visit, the after visit summary with patients personalized plan was offered to patient via MyChart   Nurse Notes: none

## 2023-08-28 DIAGNOSIS — M25562 Pain in left knee: Secondary | ICD-10-CM | POA: Diagnosis not present

## 2023-08-28 DIAGNOSIS — M25552 Pain in left hip: Secondary | ICD-10-CM | POA: Diagnosis not present

## 2023-09-13 ENCOUNTER — Encounter: Payer: Self-pay | Admitting: Gastroenterology

## 2023-09-14 ENCOUNTER — Other Ambulatory Visit: Payer: Self-pay | Admitting: Nurse Practitioner

## 2023-09-14 DIAGNOSIS — E782 Mixed hyperlipidemia: Secondary | ICD-10-CM

## 2023-09-19 ENCOUNTER — Telehealth: Payer: Self-pay

## 2023-09-19 DIAGNOSIS — E782 Mixed hyperlipidemia: Secondary | ICD-10-CM

## 2023-09-19 MED ORDER — ATORVASTATIN CALCIUM 20 MG PO TABS
20.0000 mg | ORAL_TABLET | ORAL | 0 refills | Status: DC
Start: 2023-09-19 — End: 2023-12-13

## 2023-09-19 NOTE — Telephone Encounter (Signed)
Copied from CRM [id number]. Topic: Clinical - Prescription Issue >> Sep 18, 2023  3:46 PM Samuel Jester B wrote: Reason for CRM: Pt stated that the pharmacy informed her that she is not able to receive a refill on her atorvastatin (LIPITOR) 20 MG tablet medication because her pcp has to approve it. Pt would like a callback.   Called patient and scheduled an appointment for 09/26/23 @ 8:40 am.   Rx sent to the pharmacy. Dm/cma

## 2023-09-25 ENCOUNTER — Ambulatory Visit: Payer: Medicare HMO | Admitting: Nurse Practitioner

## 2023-09-25 ENCOUNTER — Encounter (HOSPITAL_COMMUNITY): Payer: Self-pay

## 2023-09-25 ENCOUNTER — Encounter: Payer: Self-pay | Admitting: Nurse Practitioner

## 2023-09-25 ENCOUNTER — Other Ambulatory Visit: Payer: Self-pay

## 2023-09-25 ENCOUNTER — Other Ambulatory Visit (HOSPITAL_COMMUNITY): Payer: Self-pay

## 2023-09-25 VITALS — BP 110/68 | HR 74 | Temp 98.6°F | Ht 61.0 in | Wt 129.0 lb

## 2023-09-25 DIAGNOSIS — M2550 Pain in unspecified joint: Secondary | ICD-10-CM

## 2023-09-25 DIAGNOSIS — Z96619 Presence of unspecified artificial shoulder joint: Secondary | ICD-10-CM | POA: Insufficient documentation

## 2023-09-25 DIAGNOSIS — R7989 Other specified abnormal findings of blood chemistry: Secondary | ICD-10-CM

## 2023-09-25 DIAGNOSIS — E782 Mixed hyperlipidemia: Secondary | ICD-10-CM

## 2023-09-25 DIAGNOSIS — I700 Atherosclerosis of aorta: Secondary | ICD-10-CM

## 2023-09-25 DIAGNOSIS — M810 Age-related osteoporosis without current pathological fracture: Secondary | ICD-10-CM

## 2023-09-25 LAB — COMPREHENSIVE METABOLIC PANEL
ALT: 16 U/L (ref 0–35)
AST: 20 U/L (ref 0–37)
Albumin: 4.2 g/dL (ref 3.5–5.2)
Alkaline Phosphatase: 72 U/L (ref 39–117)
BUN: 11 mg/dL (ref 6–23)
CO2: 27 meq/L (ref 19–32)
Calcium: 9 mg/dL (ref 8.4–10.5)
Chloride: 103 meq/L (ref 96–112)
Creatinine, Ser: 0.76 mg/dL (ref 0.40–1.20)
GFR: 79.93 mL/min (ref >60.00)
Glucose, Bld: 91 mg/dL (ref 70–99)
Potassium: 4.1 meq/L (ref 3.5–5.1)
Sodium: 137 meq/L (ref 135–145)
Total Bilirubin: 0.6 mg/dL (ref 0.2–1.2)
Total Protein: 7 g/dL (ref 6.0–8.3)

## 2023-09-25 LAB — LIPID PANEL
Cholesterol: 178 mg/dL (ref 0–200)
HDL: 68.4 mg/dL (ref >39.00)
LDL Cholesterol: 87 mg/dL (ref 0–99)
NonHDL: 109.81
Total CHOL/HDL Ratio: 3
Triglycerides: 114 mg/dL (ref 0.0–149.0)
VLDL: 22.8 mg/dL (ref 0.0–40.0)

## 2023-09-25 MED ORDER — DENOSUMAB 60 MG/ML ~~LOC~~ SOSY
60.0000 mg | PREFILLED_SYRINGE | Freq: Once | SUBCUTANEOUS | 0 refills | Status: AC
  Filled 2023-09-25: qty 1, 180d supply, fill #0

## 2023-09-25 NOTE — Patient Instructions (Signed)
 Go to lab Maintain Heart healthy diet and daily exercise. Maintain current medications.

## 2023-09-25 NOTE — Assessment & Plan Note (Signed)
Repeat lipid panel Current use of atorvastatin 

## 2023-09-25 NOTE — Assessment & Plan Note (Signed)
Per CT chest completed 09/2021: Aortic atherosclerosis, in addition to two vessel coronary artery disease. Please note that although the presence of coronary artery calcium documents the presence of coronary artery disease, theseverity of this disease and any potential stenosis cannot be assessed on this non-gated CT examination. Assessment for potential risk factor modification, dietary therapy or pharmacologic therapy may be warranted, if clinically indicated.  Current use of atorvastatin 20mg  Repeat lipid panel today

## 2023-09-25 NOTE — Assessment & Plan Note (Signed)
Resolved with use of ursodiol Under the care of GI-Dr. Barron Alvine Repeat CMP

## 2023-09-25 NOTE — Assessment & Plan Note (Addendum)
Opted to discontinue cymbalta at this time. Pain managed with tylenol 650mg  and mobic 15mg 

## 2023-09-25 NOTE — Progress Notes (Signed)
Pharmacy Patient Advocate Encounter  Insurance verification completed.   The patient is insured through [employer] claim for Prolia. Currently a quantity of 1 is a 180 day supply and the co-pay is $930.81 .  Patient may be eligible for a Medicare prescription Payment plan. The patient will need to reach out to their insurance company to enrol in the payment plan to spread out their payments throughout the year, If available.

## 2023-09-25 NOTE — Assessment & Plan Note (Addendum)
Agreed to start prolia injection Current use of calcium and Vit D oral supplement. Last dexa scan: 01/2021

## 2023-09-25 NOTE — Progress Notes (Signed)
Established Patient Visit  Patient: Jasmine Bradford   DOB: 1956-03-07   68 y.o. Female  MRN: [id number] Visit Date: 09/25/2023  Subjective:    Chief Complaint  Patient presents with   Medication Management    Follow with medications, no concerns   HPI No problem-specific Assessment & Plan notes found for this encounter.  Reviewed medical, surgical, and social history today  Medications: Outpatient Medications Prior to Visit  Medication Sig   Acetaminophen (TYLENOL ARTHRITIS PAIN PO) Take by mouth as needed.   atorvastatin (LIPITOR) 20 MG tablet Take 1 tablet (20 mg total) by mouth as directed. TAKE 1 TABLET BY MOUTH 3 TIMES A WEEK.   Calcium Carbonate-Vit D-Min (CALCIUM 1200 PO) Take 1 tablet by mouth daily.   Carboxymethylcellulose Sodium (THERATEARS) 0.25 % SOLN Place 1 drop into both eyes as needed (eye irritation).   cetirizine (ZYRTEC) 10 MG tablet Take 10 mg by mouth daily as needed for allergies.   famotidine (PEPCID) 20 MG tablet TAKE 1 TABLET BY MOUTH EVERYDAY AT BEDTIME   Multiple Vitamin (MULTIVITAMIN WITH MINERALS) TABS tablet Take 1 tablet by mouth daily with lunch.   Probiotic Product (PROBIOTIC PO) Take 1 tablet by mouth daily.   ursodiol (ACTIGALL) 250 MG tablet TAKE 1 TABLET BY MOUTH TWICE A DAY   [DISCONTINUED] omeprazole (PRILOSEC) 40 MG capsule TAKE 1 CAPSULE BY MOUTH EVERY DAY   meloxicam (MOBIC) 15 MG tablet Take 15 mg by mouth daily.   [DISCONTINUED] acetaminophen (TYLENOL) 500 MG tablet Take 500 mg by mouth every 6 (six) hours as needed for mild pain or moderate pain. (Patient not taking: Reported on 09/25/2023)   [DISCONTINUED] AREXVY 120 MCG/0.5ML injection  (Patient not taking: Reported on 09/25/2023)   [DISCONTINUED] COMIRNATY SUSP injection Inject into the muscle. (Patient not taking: Reported on 09/25/2023)   [DISCONTINUED] DULoxetine (CYMBALTA) 20 MG capsule TAKE 1 CAPSULE BY MOUTH EVERY DAY   [DISCONTINUED] ondansetron (ZOFRAN) 4 MG  tablet Take 1 tablet (4 mg total) by mouth every 4-6 hours as needed for nausea (Patient not taking: Reported on 09/25/2023)   Facility-Administered Medications Prior to Visit  Medication Dose Route Frequency Provider   0.9 %  sodium chloride infusion  500 mL Intravenous Continuous Cirigliano, Vito V, DO   Reviewed past medical and social history.   ROS per HPI above      Objective:  BP 110/68 (BP Location: Right Arm, Patient Position: Sitting, Cuff Size: Small)   Pulse 74   Temp 98.6 F (37 C)   Ht 5\' 1"  (1.549 m)   Wt 129 lb (58.5 kg)   SpO2 98%   BMI 24.37 kg/m      Physical Exam Vitals and nursing note reviewed.  Cardiovascular:     Rate and Rhythm: Normal rate and regular rhythm.     Pulses: Normal pulses.     Heart sounds: Normal heart sounds.  Pulmonary:     Effort: Pulmonary effort is normal.     Breath sounds: Normal breath sounds.  Abdominal:     General: Bowel sounds are normal. There is no distension.     Palpations: Abdomen is soft.     Tenderness: There is no abdominal tenderness.  Musculoskeletal:     Right lower leg: No edema.     Left lower leg: No edema.  Neurological:     Mental Status: She is alert and oriented to person, place, and  time.     No results found for any visits on 09/25/23.    Assessment & Plan:    Problem List Items Addressed This Visit     Atherosclerosis of aorta (HCC) - Primary   Relevant Orders   Lipid panel   Elevated LFTs and hyperbilirubinemia   Relevant Orders   Comprehensive metabolic panel   Mixed hyperlipidemia   Relevant Orders   Comprehensive metabolic panel   Lipid panel   Osteoporosis   Relevant Medications   denosumab (PROLIA) 60 MG/ML SOSY injection   Return in about 6 months (around 03/24/2024) for hyperlipidemia (fasting).     Alysia Penna, NP

## 2023-09-25 NOTE — Progress Notes (Signed)
Patient is calling insurance to enroll in payment plan

## 2023-09-26 ENCOUNTER — Encounter: Payer: Self-pay | Admitting: Gastroenterology

## 2023-09-26 ENCOUNTER — Other Ambulatory Visit: Payer: Self-pay

## 2023-09-30 ENCOUNTER — Encounter: Payer: Self-pay | Admitting: Nurse Practitioner

## 2023-10-02 ENCOUNTER — Other Ambulatory Visit: Payer: Self-pay | Admitting: Gastroenterology

## 2023-10-02 DIAGNOSIS — Z1231 Encounter for screening mammogram for malignant neoplasm of breast: Secondary | ICD-10-CM

## 2023-10-03 ENCOUNTER — Other Ambulatory Visit: Payer: Self-pay

## 2023-10-03 ENCOUNTER — Other Ambulatory Visit: Payer: Self-pay | Admitting: Gastroenterology

## 2023-10-03 NOTE — Progress Notes (Signed)
Patient will not be getting Prolia at this time. She is talking to her provider about alternative options. Dis-enrolling

## 2023-10-09 ENCOUNTER — Other Ambulatory Visit: Payer: Self-pay | Admitting: Nurse Practitioner

## 2023-10-09 ENCOUNTER — Telehealth: Payer: Self-pay

## 2023-10-09 NOTE — Telephone Encounter (Signed)
Jasmine Bradford, patient will be scheduled as soon as possible.  Auth Submission: NO AUTH NEEDED Site of care: Site of care: [facility] Payer: [employer] medicare Medication & CPT/J Code(s) submitted: Reclast (Zolendronic acid) [id number] Route of submission (phone, fax, portal):  Phone # Fax # Auth type: Buy/Bill PB Units/visits requested: 5mg  x 1 dose Reference number:  Approval from: 10/09/23 to 08/21/24

## 2023-10-17 ENCOUNTER — Other Ambulatory Visit: Payer: Self-pay | Admitting: Gastroenterology

## 2023-10-19 ENCOUNTER — Ambulatory Visit
Admission: RE | Admit: 2023-10-19 | Discharge: 2023-10-19 | Disposition: A | Discharge status: Home / Self Care | Payer: Medicare HMO | Source: Ambulatory Visit | Attending: Gastroenterology

## 2023-10-19 DIAGNOSIS — Z1231 Encounter for screening mammogram for malignant neoplasm of breast: Secondary | ICD-10-CM

## 2023-10-20 ENCOUNTER — Encounter: Payer: Self-pay | Admitting: Gastroenterology

## 2023-10-24 ENCOUNTER — Encounter: Payer: Self-pay | Admitting: Nurse Practitioner

## 2023-10-27 ENCOUNTER — Ambulatory Visit: Payer: Medicare HMO

## 2023-10-27 VITALS — BP 124/79 | HR 76 | Temp 98.4°F | Resp 16 | Ht 61.0 in | Wt 127.4 lb

## 2023-10-27 DIAGNOSIS — M810 Age-related osteoporosis without current pathological fracture: Secondary | ICD-10-CM

## 2023-10-27 MED ORDER — ZOLEDRONIC ACID 5 MG/100ML IV SOLN
5.0000 mg | Freq: Once | INTRAVENOUS | Status: AC
  Administered 2023-10-27: 5 mg via INTRAVENOUS
  Filled 2023-10-27: qty 100

## 2023-10-27 MED ORDER — ACETAMINOPHEN 325 MG PO TABS
650.0000 mg | ORAL_TABLET | Freq: Once | ORAL | Status: AC
  Administered 2023-10-27: 650 mg via ORAL
  Filled 2023-10-27: qty 2

## 2023-10-27 MED ORDER — DIPHENHYDRAMINE HCL 25 MG PO CAPS
25.0000 mg | ORAL_CAPSULE | Freq: Once | ORAL | Status: AC
  Administered 2023-10-27: 25 mg via ORAL
  Filled 2023-10-27: qty 1

## 2023-10-27 NOTE — Progress Notes (Signed)
 Diagnosis: Osteoporosis  Provider:  Chilton Greathouse MD  Procedure: IV Infusion  IV Type: Peripheral, IV Location: R Antecubital  Reclast (Zolendronic Acid), Dose: 5 mg  Infusion Start Time: 1534  Infusion Stop Time: 1604  Post Infusion IV Care: Observation period completed and Peripheral IV Discontinued  Discharge: Condition: Good, Destination: Home . AVS Provided  Performed by:  Rico Ala, LPN

## 2023-10-27 NOTE — Patient Instructions (Signed)

## 2023-11-05 ENCOUNTER — Encounter: Payer: Self-pay | Admitting: Nurse Practitioner

## 2023-11-05 DIAGNOSIS — R112 Nausea with vomiting, unspecified: Secondary | ICD-10-CM

## 2023-11-08 ENCOUNTER — Other Ambulatory Visit (INDEPENDENT_AMBULATORY_CARE_PROVIDER_SITE_OTHER)

## 2023-11-08 DIAGNOSIS — R112 Nausea with vomiting, unspecified: Secondary | ICD-10-CM

## 2023-11-09 ENCOUNTER — Encounter: Payer: Self-pay | Admitting: Nurse Practitioner

## 2023-11-09 LAB — COMPREHENSIVE METABOLIC PANEL
ALT: 13 U/L (ref 0–35)
AST: 16 U/L (ref 0–37)
Albumin: 4.1 g/dL (ref 3.5–5.2)
Alkaline Phosphatase: 70 U/L (ref 39–117)
BUN: 10 mg/dL (ref 6–23)
CO2: 29 meq/L (ref 19–32)
Calcium: 8.9 mg/dL (ref 8.4–10.5)
Chloride: 99 meq/L (ref 96–112)
Creatinine, Ser: 0.74 mg/dL (ref 0.40–1.20)
GFR: 82.45 mL/min (ref >60.00)
Glucose, Bld: 88 mg/dL (ref 70–99)
Potassium: 4.1 meq/L (ref 3.5–5.1)
Sodium: 138 meq/L (ref 135–145)
Total Bilirubin: 0.3 mg/dL (ref 0.2–1.2)
Total Protein: 6.5 g/dL (ref 6.0–8.3)

## 2023-11-09 LAB — LIPASE: Lipase: 29 U/L (ref 11.0–59.0)

## 2023-11-14 ENCOUNTER — Encounter: Payer: Self-pay | Admitting: Nurse Practitioner

## 2023-11-14 DIAGNOSIS — M25552 Pain in left hip: Secondary | ICD-10-CM | POA: Diagnosis not present

## 2023-11-17 NOTE — Telephone Encounter (Signed)
 Called patient and she is scheduled for a nurse visit appointment for 11/21/23 at 3:20 PM

## 2023-11-21 ENCOUNTER — Ambulatory Visit (INDEPENDENT_AMBULATORY_CARE_PROVIDER_SITE_OTHER)

## 2023-11-21 DIAGNOSIS — Z01818 Encounter for other preprocedural examination: Secondary | ICD-10-CM | POA: Diagnosis not present

## 2023-11-21 NOTE — Telephone Encounter (Signed)
 Spoke to Pt and informed her that Pre-op form is complete  and will be sent and faxed. Pt verbalized understanding and a thank you.

## 2023-11-21 NOTE — Progress Notes (Signed)
 Patient is in office today for a nurse visit  EKG pre-op surgical clearance . Patient EKG was performed and results were given to provider Alysia Penna NP) for review. Forms will be sent and faxed by Terrah CMA.

## 2023-11-22 ENCOUNTER — Encounter: Payer: Self-pay | Admitting: Nurse Practitioner

## 2023-12-05 DIAGNOSIS — M1612 Unilateral primary osteoarthritis, left hip: Secondary | ICD-10-CM | POA: Diagnosis not present

## 2023-12-05 DIAGNOSIS — M25552 Pain in left hip: Secondary | ICD-10-CM | POA: Diagnosis not present

## 2023-12-13 ENCOUNTER — Other Ambulatory Visit: Payer: Self-pay

## 2023-12-13 ENCOUNTER — Other Ambulatory Visit: Payer: Self-pay | Admitting: Nurse Practitioner

## 2023-12-13 DIAGNOSIS — E782 Mixed hyperlipidemia: Secondary | ICD-10-CM

## 2023-12-13 MED ORDER — ATORVASTATIN CALCIUM 20 MG PO TABS: 20.0000 mg | ORAL_TABLET | ORAL | 0 refills | Status: DC

## 2023-12-18 ENCOUNTER — Encounter: Payer: Self-pay | Admitting: Gastroenterology

## 2023-12-18 ENCOUNTER — Ambulatory Visit: Payer: Medicare HMO | Admitting: Gastroenterology

## 2023-12-18 VITALS — BP 110/66 | HR 92 | Ht 61.0 in | Wt 125.5 lb

## 2023-12-18 DIAGNOSIS — Z860100 Personal history of colon polyps, unspecified: Secondary | ICD-10-CM | POA: Diagnosis not present

## 2023-12-18 DIAGNOSIS — K8050 Calculus of bile duct without cholangitis or cholecystitis without obstruction: Secondary | ICD-10-CM | POA: Diagnosis not present

## 2023-12-18 DIAGNOSIS — K219 Gastro-esophageal reflux disease without esophagitis: Secondary | ICD-10-CM

## 2023-12-18 NOTE — Patient Instructions (Addendum)
 _______________________________________________________  If your blood pressure at your visit was 140/90 or greater, please contact your primary care physician to follow up on this. _______________________________________________________  If you are age 70 or older, your body mass index should be between 23-30. Your Body mass index is 23.71 kg/m. If this is out of the aforementioned range listed, please consider follow up with your Primary Care Provider. ________________________________________________________  The [facility] providers would like to encourage you to use MYCHART to communicate with providers for non-urgent requests or questions.  Due to long hold times on the telephone, sending your provider a message by [facility] may be a faster and more efficient way to get a response.  Please allow 48 business hours for a response.  Please remember that this is for non-urgent requests.  _______________________________________________________  Jasmine Bradford will need a follow up appointment in our office in 2 years.  We will contact you to schedule this appointment.  It was a pleasure to see you today!  Vito Cirigliano, D.O.

## 2023-12-18 NOTE — Progress Notes (Signed)
 Chief Complaint:    GERD  GI History: 7 old female follows in the GI clinic for the following:   1) GERD: Index symptoms of dyspepsia, globus sensation.  No heartburn or regurgitation.  No dysphagia.  Started having increasing breakthrough symptoms with Prilosec, but symptoms now well-controlled with Pepcid  20 mg qhs monotherapy and rarely a 2nd dose prn breakthrough (rare). Possible bile acid reflux overlap.   2) Choledocholithiasis: History of cholelithiasis s/p ccy with IOC in 05/2016 with subsequent ERCP in 05/2018.  Readmitted 01/2020 with choledocholithiasis, underwent repeat ERCP with stone extraction.  Complicated by readmission 02/23/2020 with HCAP, pleural effusion treated with ABX and thoracentesis.  Started ursodiol  for stone dissolution/choledocholithiasis prevention.  Readmitted 09/2021 with recurrent CDL as below.  MRI/MRCP with progressive biliary duct dilatation due to dominant 15 mm mid to distal common duct stone.  Underwent repeat ERCP on 10/15/2021 as below.  CA 19-9 was elevated, then subsequently normalized 1 week later.  Normal CEA.   3) History of colon polyps: Due for repeat colonoscopy in 2023 for ongoing polyp surveillance   Endoscopic history: -ERCP (02/2016, Dr. Lavaughn [place], CD[facility]): Choledocholithiasis, removed with sphincterotomy and balloon sweeps -ERCP (05/2016, Dr. Lavaughn Portland): Choledocholithiasis, removed with further sphincterotomy and balloon sweeps with removal of stones and sludge -EGD (05/2018, Dr. Lavaughn Portland): Normal -ERCP (05/2018, Dr. Lavaughn Portland): No stones on cholangiogram.  Balloon sweeps (nothing removed) and dilation of distal CBD/ampulla with 4 cm x 8 mm balloon dilator to facilitate improved drainage -Colonoscopy (09/2018, Dr. Lavaughn Portland): External/internal hemorrhoids, sigmoid diverticulosis, 7 polyps resected (no path review), normal TI.  Recommended repeat in 3 years -ERCP (01/2020, Dr. Venice Gillis): Removal of multiple stones and pus.  Normal post occlusion cholangiogram - ERCP  (09/2021, Dr. Venice Gillis): Dilated biliary tree with "sigmoid" appearing CBD measuring 15 mm.  Multiple filling defects c/w choledocholithiasis.  Sphincteroplasty of the CBD with 13.5 mm balloon then biliary tree swept with removal of several small stones and sludge.  Largest stone could not be removed with 15 mm balloon.  Spy scope inserted.  No stricture noted.  Large stone noted in EHL performed with fracturing.  CBD then swept with 15 mm balloon several times with extraction of significant sludge.  Placed 10 French by 7 cm plastic stent into CBD.  - ERCP (12/09/2021, Dr. Brice Campi): Severely dilated bile duct up to 18 mm significant angulation of the lower third of CBD without overt stricture, multiple filling defects in upper CBD removed with balloon sweeps and sphincteroplasty.  1 cm stone remained removed with lithotripsy and repeat balloon sweeps.  Spyglass performed with removal of additional retained stones.  No stones remained on occlusion cholangiogram.  Brushings performed for cytology (cytology benign).  Remains at risk for recurrent stones - Colonoscopy (11/2022, Dr. Karene Oto): 2 small sigmoid hyperplastic polyps, multiple benign rectal hyperplastic polyps, sigmoid diverticulosis, otherwise normal colon and normal TI.  Repeat in 7 years due to history of adenomatous and sessile serrated polyps  HPI:     Patient is a 68 y.o. female presenting to the Gastroenterology Clinic for routine follow-up.  Was last seen by me in the office on 10/20/2022.  Was without any active issues at that time.  Subsequently completed colonoscopy 11/2022 which was only notable for subcentimeter hyperplastic polyps, with recommendation repeat in 7 years.  Today, she states she is overall doing well from a GI standpoint.  She stopped taking the Prilosec, and now taking Pepcid  20 mg daily only with good control.  No dysphagia.    Scheduled  for hip surgery next week; left total hip replacement.  Reviewed most recent labs  to include normal CMP and lipase in 10/2023.  No new abdominal imaging for review.  Review of systems:     No chest pain, no SOB, no fevers, no urinary sx   Past Medical History:  Diagnosis Date   Allergy    Cholelithiasis with choledocholithiasis    Diverticulitis    Epigastric abdominal pain with nausea and vomiting 08/20/2021   Epigastric abdominal pain with nausea and vomiting 08/20/2021   GERD (gastroesophageal reflux disease)    Hypotension 02/18/2020   Mixed hyperlipidemia 10/06/2021   Tobacco abuse    Varicose vein of leg    no problems now   Wears glasses     Patient's surgical history, family medical history, social history, medications and allergies were all reviewed in Epic    Current Outpatient Medications  Medication Sig Dispense Refill   Acetaminophen  (TYLENOL  ARTHRITIS PAIN PO) Take by mouth as needed.     atorvastatin  (LIPITOR) 20 MG tablet TAKE 1 TABLET BY MOUTH 3 TIMES A WEEK, AS DIRECTED 36 tablet 0   atorvastatin  (LIPITOR) 20 MG tablet Take 1 tablet (20 mg total) by mouth as directed. TAKE 1 TABLET BY MOUTH 3 TIMES A WEEK. 36 tablet 0   Calcium  Carbonate-Vit D-Min (CALCIUM  1200 PO) Take 1 tablet by mouth daily.     Carboxymethylcellulose Sodium (THERATEARS) 0.25 % SOLN Place 1 drop into both eyes as needed (eye irritation).     cetirizine (ZYRTEC) 10 MG tablet Take 10 mg by mouth daily as needed for allergies.     famotidine  (PEPCID ) 20 MG tablet TAKE 1 TABLET BY MOUTH EVERYDAY AT BEDTIME 90 tablet 1   Multiple Vitamin (MULTIVITAMIN WITH MINERALS) TABS tablet Take 1 tablet by mouth daily with lunch.     Probiotic Product (PROBIOTIC PO) Take 1 tablet by mouth daily.     ursodiol  (ACTIGALL ) 250 MG tablet TAKE 1 TABLET BY MOUTH TWICE A DAY 180 tablet 0   Current Facility-Administered Medications  Medication Dose Route Frequency Provider Last Rate Last Admin   0.9 %  sodium chloride  infusion  500 mL Intravenous Continuous Kimba Lottes V, DO        Physical  Exam:     BP 110/66   Pulse 92   Ht 5\' 1"  (1.549 m)   Wt 125 lb 8 oz (56.9 kg)   BMI 23.71 kg/m   GENERAL:  Pleasant female in NAD PSYCH: : Cooperative, normal affect Musculoskeletal:  Normal muscle tone, normal strength NEURO: Alert and oriented x 3, no focal neurologic deficits   IMPRESSION and PLAN:    1) Choledocholithiasis 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  Has undergone several ERCPs in the past with sphincteroplasty, balloon sweeps, lithotripsy, spyglass, etc. as outlined above.  Last episode was 11/2021.  Otherwise negative CBD cytology.  CA125 normalized.  Repeat MRCP in 05/2022 with near complete resolution of biliary duct dilation, measuring 8 mm without CDL. - Continue ursodiol  - Discussed repeat MRCP vs continued observation.  As she is otherwise without symptoms and recent CMP normal, opted for the latter   2) History of colon polyps - Repeat colonoscopy in 11/2029 for ongoing polyp surveillance, pending overall health and patient wishes at that time   3) GERD - Well-controlled on current therapy - Continue Pepcid      RTC in 2 years or sooner prn     I spent 30  minutes of time, including in depth chart review, independent review of results as outlined above, communicating results with the patient directly, face-to-face time with the patient, coordinating care, and ordering studies and medications as appropriate, and documentation.       Annis Kinder ,DO, FACG 12/18/2023, 2:36 PM

## 2023-12-29 DIAGNOSIS — M1612 Unilateral primary osteoarthritis, left hip: Secondary | ICD-10-CM | POA: Diagnosis not present

## 2023-12-29 HISTORY — PX: TOTAL HIP ARTHROPLASTY: SHX124

## 2024-01-01 ENCOUNTER — Telehealth: Payer: Self-pay

## 2024-01-01 DIAGNOSIS — M1612 Unilateral primary osteoarthritis, left hip: Secondary | ICD-10-CM | POA: Diagnosis not present

## 2024-01-01 DIAGNOSIS — R262 Difficulty in walking, not elsewhere classified: Secondary | ICD-10-CM | POA: Diagnosis not present

## 2024-01-01 DIAGNOSIS — M6281 Muscle weakness (generalized): Secondary | ICD-10-CM | POA: Diagnosis not present

## 2024-01-01 NOTE — Telephone Encounter (Signed)
 Our office received paperwork from [facility] [employer] for short term disability. I called the patient and left a voice message per DPR asking to give me a call back at the office at [phone].   I wanted to let patient know that I called the number listed on the paperwork and informed the representative that the paperwork was faxed to the wrong office that it needed to be faxed to [facility]. I was informed the rep will inform the case supervisor and contact the patient/client to get the needed information.

## 2024-01-01 NOTE — Telephone Encounter (Signed)
 Patient returned my call and I informed her of what I was told. She thanked me for calling her and stated that she told them not to send the papers to [place]. She also stated that she will give them a call. I thanked her for returning my call.

## 2024-01-09 DIAGNOSIS — M6281 Muscle weakness (generalized): Secondary | ICD-10-CM | POA: Diagnosis not present

## 2024-01-09 DIAGNOSIS — M1612 Unilateral primary osteoarthritis, left hip: Secondary | ICD-10-CM | POA: Diagnosis not present

## 2024-01-09 DIAGNOSIS — R262 Difficulty in walking, not elsewhere classified: Secondary | ICD-10-CM | POA: Diagnosis not present

## 2024-01-11 DIAGNOSIS — M1612 Unilateral primary osteoarthritis, left hip: Secondary | ICD-10-CM | POA: Diagnosis not present

## 2024-01-14 ENCOUNTER — Other Ambulatory Visit: Payer: Self-pay | Admitting: Gastroenterology

## 2024-01-17 DIAGNOSIS — R262 Difficulty in walking, not elsewhere classified: Secondary | ICD-10-CM | POA: Diagnosis not present

## 2024-01-17 DIAGNOSIS — M6281 Muscle weakness (generalized): Secondary | ICD-10-CM | POA: Diagnosis not present

## 2024-01-17 DIAGNOSIS — M1612 Unilateral primary osteoarthritis, left hip: Secondary | ICD-10-CM | POA: Diagnosis not present

## 2024-01-24 DIAGNOSIS — M6281 Muscle weakness (generalized): Secondary | ICD-10-CM | POA: Diagnosis not present

## 2024-01-24 DIAGNOSIS — M1612 Unilateral primary osteoarthritis, left hip: Secondary | ICD-10-CM | POA: Diagnosis not present

## 2024-01-24 DIAGNOSIS — R262 Difficulty in walking, not elsewhere classified: Secondary | ICD-10-CM | POA: Diagnosis not present

## 2024-01-29 DIAGNOSIS — H5203 Hypermetropia, bilateral: Secondary | ICD-10-CM | POA: Diagnosis not present

## 2024-01-29 DIAGNOSIS — H524 Presbyopia: Secondary | ICD-10-CM | POA: Diagnosis not present

## 2024-01-31 DIAGNOSIS — M1612 Unilateral primary osteoarthritis, left hip: Secondary | ICD-10-CM | POA: Diagnosis not present

## 2024-01-31 DIAGNOSIS — R262 Difficulty in walking, not elsewhere classified: Secondary | ICD-10-CM | POA: Diagnosis not present

## 2024-01-31 DIAGNOSIS — M6281 Muscle weakness (generalized): Secondary | ICD-10-CM | POA: Diagnosis not present

## 2024-02-07 DIAGNOSIS — M6281 Muscle weakness (generalized): Secondary | ICD-10-CM | POA: Diagnosis not present

## 2024-02-07 DIAGNOSIS — R262 Difficulty in walking, not elsewhere classified: Secondary | ICD-10-CM | POA: Diagnosis not present

## 2024-02-07 DIAGNOSIS — M1612 Unilateral primary osteoarthritis, left hip: Secondary | ICD-10-CM | POA: Diagnosis not present

## 2024-02-08 DIAGNOSIS — M1612 Unilateral primary osteoarthritis, left hip: Secondary | ICD-10-CM | POA: Diagnosis not present

## 2024-02-14 ENCOUNTER — Encounter: Payer: Self-pay | Admitting: Nurse Practitioner

## 2024-02-14 NOTE — Telephone Encounter (Signed)
 Called patient to get scheduled for an office appointment and she informed me that she is now feeling better. She made the comment of you know how something is bothering you and once you say something it gets better. I informed her that if it occurs again to give our office a call to make an appointment to be evaluated. She thanked me for calling back so soon and will be monitoring if the symptoms return.

## 2024-02-16 DIAGNOSIS — M1612 Unilateral primary osteoarthritis, left hip: Secondary | ICD-10-CM | POA: Diagnosis not present

## 2024-02-16 DIAGNOSIS — R262 Difficulty in walking, not elsewhere classified: Secondary | ICD-10-CM | POA: Diagnosis not present

## 2024-02-16 DIAGNOSIS — M6281 Muscle weakness (generalized): Secondary | ICD-10-CM | POA: Diagnosis not present

## 2024-03-09 ENCOUNTER — Other Ambulatory Visit: Payer: Self-pay | Admitting: Nurse Practitioner

## 2024-03-09 DIAGNOSIS — E782 Mixed hyperlipidemia: Secondary | ICD-10-CM

## 2024-03-11 NOTE — Telephone Encounter (Signed)
 Medication: Atorvastatin  (Lipitor) 20 mg  Directions: Take 1 tablet by mouth 3 times a week  Last given: 12/13/23 Number refills: 0 Last o/v: 09/25/23 Follow up: 6 months  Labs:  09/25/23

## 2024-03-21 DIAGNOSIS — M1612 Unilateral primary osteoarthritis, left hip: Secondary | ICD-10-CM | POA: Diagnosis not present

## 2024-03-29 ENCOUNTER — Ambulatory Visit: Payer: Medicare HMO | Admitting: Nurse Practitioner

## 2024-03-31 ENCOUNTER — Other Ambulatory Visit: Payer: Self-pay | Admitting: Gastroenterology

## 2024-04-04 ENCOUNTER — Ambulatory Visit: Admission: EM | Admit: 2024-04-04 | Discharge: 2024-04-04 | Disposition: A | Discharge status: Home / Self Care

## 2024-04-04 ENCOUNTER — Encounter: Payer: Self-pay | Admitting: Emergency Medicine

## 2024-04-04 DIAGNOSIS — R21 Rash and other nonspecific skin eruption: Secondary | ICD-10-CM | POA: Diagnosis not present

## 2024-04-04 NOTE — ED Triage Notes (Signed)
 Pt presents c/o skin rash x 2 days. Pt reports she noticed the rash on her chest and right shoulder area. She also has rash beneath her breast and spreading. Pt denies itching and reports she has taken OTC Benadryl . Pt denies contact with any known allergens.

## 2024-04-07 NOTE — ED Provider Notes (Signed)
 [facility]    CSN: [id number] Arrival date & time: 04/04/24  1828      History   Chief Complaint Chief Complaint  Patient presents with   Rash    HPI Jasmine Bradford is a 68 y.o. female.   Patient here today for evaluation of rash to her chest and right shoulder area that started about 2 days ago.  She reports that rash is not itchy and is not painful or burning.  She has tried over-the-counter Benadryl  without resolution.  She denies any known contact with new products or allergens.  She has not had any shortness of breath or trouble swallowing.  The history is provided by the patient.  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   Arthralgia of multiple joints 10/27/2022   Family history of ovarian cancer 10/21/2021   Ovarian cyst 10/14/2021   Mixed hyperlipidemia 10/06/2021   Atherosclerosis of aorta (HCC) 10/06/2021   Osteoporosis 02/18/2021   Choledocholithiasis    GERD (gastroesophageal reflux disease) 02/18/2020   Elevated LFTs and hyperbilirubinemia    Atypical chest pain 03/03/2016   Former tobacco use     Past Surgical History:  Procedure Laterality Date   BILIARY BRUSHING  12/09/2021   Procedure: BILIARY BRUSHING;  Surgeon: Wilhelmenia Aloha Raddle., MD;  Location: [facility] ENDOSCOPY;  Service: Gastroenterology;;   BILIARY DILATION  02/19/2020   Procedure: BILIARY  DILATION;  Surgeon: Charlanne Groom, MD;  Location: [facility] ENDOSCOPY;  Service: Endoscopy;;   BILIARY DILATION  10/15/2021   Procedure: BILIARY DILATION;  Surgeon: Charlanne Groom, MD;  Location: [facility] ENDOSCOPY;  Service: Gastroenterology;;   BILIARY DILATION  12/09/2021   Procedure: BILIARY DILATION;  Surgeon: Wilhelmenia Aloha Raddle., MD;  Location: [facility] ENDOSCOPY;  Service: Gastroenterology;;   BILIARY STENT PLACEMENT  10/15/2021   Procedure: BILIARY STENT PLACEMENT;  Surgeon: Charlanne Groom, MD;  Location: [facility] ENDOSCOPY;  Service: Gastroenterology;;   CESAREAN SECTION     CHOLECYSTECTOMY N/A 05/23/2016   Procedure: LAPAROSCOPIC CHOLECYSTECTOMY WITH INTRAOPERATIVE CHOLANGIOGRAM;  Surgeon: Krystal Russell, MD;  Location: [facility] OR;  Service: General;  Laterality: N/A;   ENDOSCOPIC RETROGRADE CHOLANGIOPANCREATOGRAPHY (ERCP) WITH PROPOFOL  N/A 06/06/2016   Procedure: ENDOSCOPIC RETROGRADE CHOLANGIOPANCREATOGRAPHY (ERCP) WITH PROPOFOL ;  Surgeon: Oliva Boots, MD;  Location: [facility] ENDOSCOPY;  Service: Endoscopy;  Laterality: N/A;   ENDOSCOPIC RETROGRADE CHOLANGIOPANCREATOGRAPHY (ERCP) WITH PROPOFOL  N/A 02/19/2020   Procedure: ENDOSCOPIC RETROGRADE CHOLANGIOPANCREATOGRAPHY (ERCP) WITH PROPOFOL ;  Surgeon: Charlanne Groom, MD;  Location: [facility] ENDOSCOPY;  Service: Endoscopy;  Laterality: N/A;   ENDOSCOPIC RETROGRADE CHOLANGIOPANCREATOGRAPHY (ERCP) WITH PROPOFOL  N/A 10/15/2021   Procedure: ENDOSCOPIC RETROGRADE CHOLANGIOPANCREATOGRAPHY (ERCP) WITH PROPOFOL ;  Surgeon: Charlanne Groom, MD;  Location: [facility] ENDOSCOPY;  Service: Gastroenterology;  Laterality: N/A;   ENDOSCOPIC RETROGRADE CHOLANGIOPANCREATOGRAPHY (ERCP) WITH PROPOFOL  N/A 12/09/2021   Procedure: ENDOSCOPIC RETROGRADE CHOLANGIOPANCREATOGRAPHY (ERCP) WITH PROPOFOL ;  Surgeon: Wilhelmenia Aloha Raddle., MD;  Location: [facility]  ENDOSCOPY;  Service: Gastroenterology;  Laterality: N/A;   ERCP N/A 03/09/2016   Procedure: ENDOSCOPIC RETROGRADE CHOLANGIOPANCREATOGRAPHY (ERCP);  Surgeon: Oliva Boots, MD;  Location: [facility] ENDOSCOPY;  Service: Endoscopy;  Laterality: N/A;   ERCP N/A 06/12/2018   Procedure: ENDOSCOPIC RETROGRADE CHOLANGIOPANCREATOGRAPHY (ERCP);  Surgeon: Boots Oliva, MD;  Location: [facility] ENDOSCOPY;  Service: Endoscopy;  Laterality: N/A;   ESOPHAGOGASTRODUODENOSCOPY N/A 06/12/2018   Procedure: ESOPHAGOGASTRODUODENOSCOPY (EGD);  Surgeon: Boots Oliva, MD;  Location: [facility] ENDOSCOPY;  Service: Endoscopy;  Laterality: N/A;   laser vein surgery     last year   LITHOTRIPSY  12/09/2021   Procedure: LITHOTRIPSY;  Surgeon: Mansouraty, Aloha Raddle., MD;  Location: [facility] ENDOSCOPY;  Service: Gastroenterology;;   REMOVAL OF STONES  06/12/2018   Procedure: REMOVAL OF STONES;  Surgeon: Boots Oliva, MD;  Location: [facility] ENDOSCOPY;  Service: Endoscopy;;   REMOVAL OF STONES  02/19/2020   Procedure: REMOVAL OF STONES;  Surgeon: Charlanne Groom, MD;  Location: [facility] ENDOSCOPY;  Service: Endoscopy;;   REMOVAL OF STONES  10/15/2021   Procedure: REMOVAL OF STONES;  Surgeon: Charlanne Groom, MD;  Location: [facility] ENDOSCOPY;  Service: Gastroenterology;;   REMOVAL OF STONES  12/09/2021   Procedure: REMOVAL OF STONES;  Surgeon: Wilhelmenia Aloha Raddle., MD;  Location: [facility] ENDOSCOPY;  Service: Gastroenterology;;   ROBOTIC ASSISTED SALPINGO OOPHERECTOMY Bilateral 12/21/2021   Procedure: XI ROBOTIC ASSISTED SALPINGO OOPHORECTOMY;  Surgeon: Viktoria Comer SAUNDERS, MD;  Location: [facility] ORS;  Service: Gynecology;  Laterality: Bilateral;   SPHINCTEROTOMY  06/12/2018   Procedure: SPHINCTEROTOMY;  Surgeon: Boots Oliva, MD;  Location: [facility] ENDOSCOPY;  Service: Endoscopy;;   SPHINCTEROTOMY  02/19/2020   Procedure: ANNETT;  Surgeon: Charlanne Groom, MD;  Location: [facility] ENDOSCOPY;  Service: Endoscopy;;   SPYGLASS CHOLANGIOSCOPY N/A 10/15/2021   Procedure: DEBHOJDD CHOLANGIOSCOPY;  Surgeon: Charlanne Groom, MD;  Location: [facility] ENDOSCOPY;  Service: Gastroenterology;  Laterality: N/A;   SPYGLASS CHOLANGIOSCOPY N/A 12/09/2021   Procedure:  SPYGLASS CHOLANGIOSCOPY;  Surgeon: Wilhelmenia Aloha Raddle., MD;  Location: [facility] ENDOSCOPY;  Service: Gastroenterology;  Laterality: N/A;   SPYGLASS LITHOTRIPSY N/A 10/15/2021   Procedure: DEBHOJDD LITHOTRIPSY;  Surgeon: Charlanne Groom, MD;  Location: [facility] ENDOSCOPY;  Service: Gastroenterology;  Laterality: N/A;   STENT REMOVAL  12/09/2021   Procedure: STENT REMOVAL;  Surgeon: Wilhelmenia Aloha Raddle., MD;  Location: [facility] ENDOSCOPY;  Service: Gastroenterology;;   TOTAL SHOULDER REPLACEMENT Right 02/08/2023   TUBAL LIGATION     at time of c-section   VARICOSE VEIN SURGERY Left     OB History     Gravida  4   Para      Term      Preterm      AB      Living  4      SAB      IAB      Ectopic      Multiple      Live Births  4            Home Medications    Prior to Admission medications   Medication Sig Start Date End Date Taking? Authorizing Provider  Acetaminophen  (TYLENOL  ARTHRITIS PAIN PO) Take by mouth as needed.    [provider]  atorvastatin  (LIPITOR) 20 MG tablet Take 1 tablet (20 mg total) by mouth as directed. TAKE 1 TABLET BY MOUTH 3 TIMES A WEEK. 12/13/23   Nche, Roselie Rockford, NP  atorvastatin  (LIPITOR) 20 MG tablet TAKE 1 TABLET BY MOUTH 3 TIMES A WEEK, AS DIRECTED 03/11/24   Nche,  Charlotte Lum, NP  Calcium  Carbonate-Vit D-Min (CALCIUM  1200 PO) Take 1 tablet by mouth daily.    [provider]  Carboxymethylcellulose Sodium (THERATEARS) 0.25 % SOLN Place 1 drop into both eyes as needed (eye irritation).    [provider]  cetirizine (ZYRTEC) 10 MG tablet Take 10 mg by mouth daily as needed for allergies.    [provider]  famotidine  (PEPCID ) 20 MG tablet TAKE 1 TABLET BY MOUTH EVERYDAY AT BEDTIME 04/01/24   Cirigliano, Vito V, DO  Multiple Vitamin (MULTIVITAMIN WITH MINERALS) TABS tablet Take 1 tablet by mouth daily with lunch.    [provider]  Probiotic Product (PROBIOTIC PO) Take 1 tablet by mouth daily.     [provider]  ursodiol  (ACTIGALL ) 250 MG tablet TAKE 1 TABLET BY MOUTH TWICE A DAY 01/16/24   Cirigliano, Sandor GAILS, DO    Family History Family History  Problem Relation Age of Onset   Memory loss Mother    Ovarian cancer Mother    Colon cancer Neg Hx    Breast cancer Neg Hx    Endometrial cancer Neg Hx    Pancreatic cancer Neg Hx    Prostate cancer Neg Hx     Social History Social History   Tobacco Use   Smoking status: Former    Current packs/day: 0.50    Average packs/day: 0.5 packs/day for 35.0 years (17.5 ttl pk-yrs)    Types: Cigarettes    Passive exposure: Past   Smokeless tobacco: Never   Tobacco comments:    No smoking since 02/2016  Vaping Use   Vaping status: Never Used  Substance Use Topics   Alcohol  use: Yes    Comment: occ   Drug use: Never     Allergies   Latex, Oxycodone , Potassium, and Tape   Review of Systems Review of Systems  Constitutional:  Negative for chills and fever.  HENT:  Negative for facial swelling and trouble swallowing.   Eyes:  Negative for discharge and redness.  Respiratory:  Negative for shortness of breath.   Gastrointestinal:  Negative for abdominal pain, nausea and vomiting.  Skin:  Positive for rash.     Physical Exam Triage Vital Signs ED Triage Vitals  Encounter Vitals Group     BP 04/04/24 1913 (!) 147/74     Girls Systolic BP Percentile --      Girls Diastolic BP Percentile --      Boys Systolic BP Percentile --      Boys Diastolic BP Percentile --      Pulse Rate 04/04/24 1913 69     Resp 04/04/24 1913 16     Temp 04/04/24 1913 98.2 F (36.8 C)     Temp Source 04/04/24 1913 Oral     SpO2 04/04/24 1913 97 %     Weight 04/04/24 1912 125 lb 7.1 oz (56.9 kg)     Height --      Head Circumference --      Peak Flow --      Pain Score 04/04/24 1910 0     Pain Loc --      Pain Education --      Exclude from Growth Chart --    No data found.  Updated Vital Signs BP (!) 147/74 (BP Location: Left  Arm)   Pulse 69   Temp 98.2 F (36.8 C) (Oral)   Resp 16   Wt 125 lb 7.1 oz (56.9 kg)   SpO2 97%  BMI 23.70 kg/m   Visual Acuity Right Eye Distance:   Left Eye Distance:   Bilateral Distance:    Right Eye Near:   Left Eye Near:    Bilateral Near:     Physical Exam Vitals and nursing note reviewed.  Constitutional:      General: She is not in acute distress.    Appearance: Normal appearance. She is not ill-appearing.  HENT:     Head: Normocephalic and atraumatic.  Eyes:     Conjunctiva/sclera: Conjunctivae normal.  Cardiovascular:     Rate and Rhythm: Normal rate.  Pulmonary:     Effort: Pulmonary effort is normal. No respiratory distress.  Skin:    Findings: Rash (mildly erythematous sandpaper like papular eruption to chest, upper abdomen, shoulders, back) present.  Neurological:     Mental Status: She is alert.  Psychiatric:        Mood and Affect: Mood normal.        Behavior: Behavior normal.        Thought Content: Thought content normal.      UC Treatments / Results  Labs (all labs ordered are listed, but only abnormal results are displayed) Labs Reviewed - No data to display  EKG   Radiology No results found.  Procedures Procedures (including critical care time)  Medications Ordered in UC Medications - No data to display  Initial Impression / Assessment and Plan / UC Course  I have reviewed the triage vital signs and the nursing notes.  Pertinent labs & imaging results that were available during my care of the patient were reviewed by me and considered in my medical decision making (see chart for details).    I suspect possible viral exanthem given lack of contact and no itching or pain associated with same.  Advise she continue to monitor.  If viral exanthem symptoms should resolve over the next several days.  Encouraged patient to use hydrocortisone if needed.  Recommended follow-up with any further concerns or new symptoms.  Patient  expressed understanding.  Final Clinical Impressions(s) / UC Diagnoses   Final diagnoses:  Rash and nonspecific skin eruption   Discharge Instructions   None    ED Prescriptions   None    PDMP not reviewed this encounter.   Billy Asberry FALCON, PA-C 04/07/24 1145

## 2024-04-17 ENCOUNTER — Encounter: Payer: Self-pay | Admitting: Nurse Practitioner

## 2024-04-17 ENCOUNTER — Ambulatory Visit (INDEPENDENT_AMBULATORY_CARE_PROVIDER_SITE_OTHER): Admitting: Nurse Practitioner

## 2024-04-17 VITALS — BP 116/68 | HR 68 | Temp 98.6°F | Ht 61.0 in | Wt 132.8 lb

## 2024-04-17 DIAGNOSIS — R600 Localized edema: Secondary | ICD-10-CM

## 2024-04-17 DIAGNOSIS — R202 Paresthesia of skin: Secondary | ICD-10-CM

## 2024-04-17 DIAGNOSIS — I700 Atherosclerosis of aorta: Secondary | ICD-10-CM

## 2024-04-17 DIAGNOSIS — E782 Mixed hyperlipidemia: Secondary | ICD-10-CM | POA: Diagnosis not present

## 2024-04-17 NOTE — Assessment & Plan Note (Signed)
Maintain atorvastatin

## 2024-04-17 NOTE — Patient Instructions (Signed)
 Use thigh high compression stocking, exercise, low salt diet to manage LE edema  Hand numbness due to arthritis and repetitive use: take frequent breaks between craft activities.  Maintain current med doses

## 2024-04-17 NOTE — Progress Notes (Signed)
 Established Patient Visit  Patient: Jasmine Bradford   DOB: April 20, 1956   68 y.o. Female  MRN: [id number] Visit Date: 04/17/2024  Subjective:    Chief Complaint  Patient presents with   Follow-up    FASTING 6 month follow up    Hand Pain  The incident occurred more than 1 week ago. The incident occurred at home. The injury mechanism was repetitive motion (increased crotchet in last 3months). The pain is present in the left hand, right hand, left fingers and right fingers. Quality: numbness. The pain does not radiate. The pain is at a severity of 1/10. The pain has been Intermittent since the incident. Associated symptoms include numbness. Pertinent negatives include no chest pain, muscle weakness or tingling. Nothing aggravates the symptoms. She has tried nothing for the symptoms. Improvement on treatment: symptoms are brief and resolve spontaneuosly.   She also reports intermittent LE edema, no redness, no pain, no CP, no SOB, no cough, worse with prolonged standing and sitting.  Atherosclerosis of aorta (HCC) Maintain atorvastatin  dose Refill sent  Mixed hyperlipidemia Maintain atorvastatin   Reviewed medical, surgical, and social history today  Medications: Outpatient Medications Prior to Visit  Medication Sig   Acetaminophen  (TYLENOL  ARTHRITIS PAIN PO) Take by mouth as needed.   atorvastatin  (LIPITOR) 20 MG tablet TAKE 1 TABLET BY MOUTH 3 TIMES A WEEK, AS DIRECTED   Calcium  Carbonate-Vit D-Min (CALCIUM  1200 PO) Take 1 tablet by mouth daily. 600 mg daily   Carboxymethylcellulose Sodium (THERATEARS) 0.25 % SOLN Place 1 drop into both eyes as needed (eye irritation).   cetirizine (ZYRTEC) 10 MG tablet Take 10 mg by mouth daily as needed for allergies.   famotidine  (PEPCID ) 20 MG tablet TAKE 1 TABLET BY MOUTH EVERYDAY AT BEDTIME   Multiple Vitamin (MULTIVITAMIN WITH MINERALS) TABS tablet Take 1 tablet by mouth daily with lunch.   ondansetron  (ZOFRAN -ODT) 4 MG  disintegrating tablet Take 4 mg by mouth every 8 (eight) hours as needed.   Probiotic Product (PROBIOTIC PO) Take 1 tablet by mouth daily.   ursodiol  (ACTIGALL ) 250 MG tablet TAKE 1 TABLET BY MOUTH TWICE A DAY   [DISCONTINUED] atorvastatin  (LIPITOR) 20 MG tablet Take 1 tablet (20 mg total) by mouth as directed. TAKE 1 TABLET BY MOUTH 3 TIMES A WEEK.   Facility-Administered Medications Prior to Visit  Medication Dose Route Frequency Provider   0.9 %  sodium chloride  infusion  500 mL Intravenous Continuous Cirigliano, Vito V, DO   Reviewed past medical and social history.   ROS per HPI above  Last metabolic panel Lab Results  Component Value Date   GLUCOSE 88 11/08/2023   NA 138 11/08/2023   K 4.1 11/08/2023   CL 99 11/08/2023   CO2 29 11/08/2023   BUN 10 11/08/2023   CREATININE 0.74 11/08/2023   GFR 82.45 11/08/2023   CALCIUM  8.9 11/08/2023   PHOS 4.4 02/18/2020   PROT 6.5 11/08/2023   ALBUMIN 4.1 11/08/2023   BILITOT 0.3 11/08/2023   ALKPHOS 70 11/08/2023   AST 16 11/08/2023   ALT 13 11/08/2023   ANIONGAP 5 12/08/2021   Last lipids Lab Results  Component Value Date   CHOL 178 09/25/2023   HDL 68.40 09/25/2023   LDLCALC 87 09/25/2023   TRIG 114.0 09/25/2023   CHOLHDL 3 09/25/2023        Objective:  BP 116/68 (BP Location: Left Arm, Patient Position: Sitting, Cuff Size:  Large)   Pulse 68   Temp 98.6 F (37 C) (Oral)   Ht 5' 1 (1.549 m)   Wt 132 lb 12.8 oz (60.2 kg)   SpO2 98%   BMI 25.09 kg/m      Physical Exam Cardiovascular:     Rate and Rhythm: Normal rate and regular rhythm.     Pulses: Normal pulses.          Dorsalis pedis pulses are 2+ on the right side and 2+ on the left side.       Posterior tibial pulses are 2+ on the right side and 2+ on the left side.     Heart sounds: Normal heart sounds.     Comments: Trace bilateral LE edema and ankle, non pitting Pulmonary:     Effort: Pulmonary effort is normal.     Breath sounds: Normal breath  sounds.  Musculoskeletal:     Right elbow: Normal.     Left elbow: Normal.     Right forearm: Normal.     Left forearm: Normal.     Right wrist: Normal.     Left wrist: Normal.     Right hand: Normal.     Left hand: Normal.     Right lower leg: Edema present.     Left lower leg: Edema present.  Feet:     Right foot:     Skin integrity: No skin breakdown or erythema.     Toenail Condition: Right toenails are normal.     Left foot:     Skin integrity: No skin breakdown or erythema.     Toenail Condition: Left toenails are normal.  Skin:    General: Skin is warm and dry.  Neurological:     Mental Status: She is alert and oriented to person, place, and time.     No results found for any visits on 04/17/24.    Assessment & Plan:    Problem List Items Addressed This Visit     Atherosclerosis of aorta (HCC)   Maintain atorvastatin  dose Refill sent      Mixed hyperlipidemia - Primary   Maintain atorvastatin       Other Visit Diagnoses       Bilateral leg edema         Paresthesia of hand, bilateral          Use thigh high compression stocking, exercise, low salt diet to manage LE edema  Hand numbness due to arthritis and repetitive use: take frequent breaks between craft activities.  Return in about 6 months (around 10/18/2024) for hyperlipidemia (fasting).     Roselie Mood, NP

## 2024-04-17 NOTE — Assessment & Plan Note (Signed)
 Maintain atorvastatin  dose Refill sent

## 2024-06-17 ENCOUNTER — Ambulatory Visit (INDEPENDENT_AMBULATORY_CARE_PROVIDER_SITE_OTHER): Payer: Medicare HMO

## 2024-06-17 DIAGNOSIS — Z0000 Encounter for general adult medical examination without abnormal findings: Secondary | ICD-10-CM

## 2024-06-17 NOTE — Progress Notes (Signed)
 Subjective:   Jasmine Bradford is a 68 y.o. who presents for a Medicare Wellness preventive visit.  As a reminder, Annual Wellness Visits don't include a physical exam, and some assessments may be limited, especially if this visit is performed virtually. We may recommend an in-person follow-up visit with your provider if needed.  Visit Complete: Virtual I connected with  Jasmine Bradford on 06/17/24 by a video and audio enabled telemedicine application and verified that I am speaking with the correct person using two identifiers.  Patient Location: Home  Provider Location: Office/Clinic  I discussed the limitations of evaluation and management by telemedicine. The patient expressed understanding and agreed to proceed.  Vital Signs: Because this visit was a virtual/telehealth visit, some criteria may be missing or patient reported. Any vitals not documented were not able to be obtained and vitals that have been documented are patient reported.    Persons Participating in Visit: Patient.  AWV Questionnaire: Yes: Patient Medicare AWV questionnaire was completed by the patient on 06/13/2024; I have confirmed that all information answered by patient is correct and no changes since this date.  Cardiac Risk Factors include: advanced age (>34men, >65 women);dyslipidemia     Objective:    Today's Vitals   There is no height or weight on file to calculate BMI.     06/17/2024   10:44 AM 06/12/2023   10:48 AM 06/07/2022    2:50 PM 12/09/2021    6:52 AM 12/08/2021    7:56 AM 11/19/2021   12:10 PM 10/15/2021    8:00 AM  Advanced Directives  Does Patient Have a Medical Advance Directive? No No Yes No No No No  Type of Surveyor, Minerals;Living will      Copy of Healthcare Power of Attorney in Chart?   No - copy requested      Would patient like information on creating a medical advance directive?      Yes (MAU/Ambulatory/Procedural Areas -  Information given) No - Patient declined    Current Medications (verified) Outpatient Encounter Medications as of 06/17/2024  Medication Sig   Acetaminophen  (TYLENOL  ARTHRITIS PAIN PO) Take by mouth as needed.   atorvastatin  (LIPITOR) 20 MG tablet TAKE 1 TABLET BY MOUTH 3 TIMES A WEEK, AS DIRECTED   Calcium  Carbonate-Vit D-Min (CALCIUM  1200 PO) Take 1 tablet by mouth daily. 600 mg daily   Carboxymethylcellulose Sodium (THERATEARS) 0.25 % SOLN Place 1 drop into both eyes as needed (eye irritation).   cetirizine (ZYRTEC) 10 MG tablet Take 10 mg by mouth daily as needed for allergies.   famotidine  (PEPCID ) 20 MG tablet TAKE 1 TABLET BY MOUTH EVERYDAY AT BEDTIME   Multiple Vitamin (MULTIVITAMIN WITH MINERALS) TABS tablet Take 1 tablet by mouth daily with lunch.   ondansetron  (ZOFRAN -ODT) 4 MG disintegrating tablet Take 4 mg by mouth every 8 (eight) hours as needed.   Probiotic Product (PROBIOTIC PO) Take 1 tablet by mouth daily.   ursodiol  (ACTIGALL ) 250 MG tablet TAKE 1 TABLET BY MOUTH TWICE A DAY   Facility-Administered Encounter Medications as of 06/17/2024  Medication   0.9 %  sodium chloride  infusion    Allergies (verified) Latex, Hydrocodone, Oxycodone , Potassium, and Tape   History: Past Medical History:  Diagnosis Date   Allergy    Cholelithiasis with choledocholithiasis    Diverticulitis    Epigastric abdominal pain with nausea and vomiting 08/20/2021   Epigastric abdominal pain with nausea and vomiting 08/20/2021   GERD (  gastroesophageal reflux disease)    Hypotension 02/18/2020   Mixed hyperlipidemia 10/06/2021   Tobacco abuse    Varicose vein of leg    no problems now   Wears glasses    Past Surgical History:  Procedure Laterality Date   BILIARY BRUSHING  12/09/2021   Procedure: BILIARY BRUSHING;  Surgeon: Wilhelmenia Aloha Raddle., MD;  Location: [facility] ENDOSCOPY;  Service: Gastroenterology;;   BILIARY DILATION  02/19/2020   Procedure: BILIARY DILATION;  Surgeon:  Charlanne Groom, MD;  Location: [facility] ENDOSCOPY;  Service: Endoscopy;;   BILIARY DILATION  10/15/2021   Procedure: BILIARY DILATION;  Surgeon: Charlanne Groom, MD;  Location: [facility] ENDOSCOPY;  Service: Gastroenterology;;   BILIARY DILATION  12/09/2021   Procedure: BILIARY DILATION;  Surgeon: Wilhelmenia Aloha Raddle., MD;  Location: [facility] ENDOSCOPY;  Service: Gastroenterology;;   BILIARY STENT PLACEMENT  10/15/2021   Procedure: BILIARY STENT PLACEMENT;  Surgeon: Charlanne Groom, MD;  Location: [facility] ENDOSCOPY;  Service: Gastroenterology;;   CESAREAN SECTION     CHOLECYSTECTOMY N/A 05/23/2016   Procedure: LAPAROSCOPIC CHOLECYSTECTOMY WITH INTRAOPERATIVE CHOLANGIOGRAM;  Surgeon: Krystal Russell, MD;  Location: [facility] OR;  Service: General;  Laterality: N/A;   ENDOSCOPIC RETROGRADE CHOLANGIOPANCREATOGRAPHY (ERCP) WITH PROPOFOL  N/A 06/06/2016   Procedure: ENDOSCOPIC RETROGRADE CHOLANGIOPANCREATOGRAPHY (ERCP) WITH PROPOFOL ;  Surgeon: Oliva Boots, MD;  Location: [facility] ENDOSCOPY;  Service: Endoscopy;  Laterality: N/A;   ENDOSCOPIC RETROGRADE CHOLANGIOPANCREATOGRAPHY (ERCP) WITH PROPOFOL  N/A 02/19/2020   Procedure: ENDOSCOPIC RETROGRADE CHOLANGIOPANCREATOGRAPHY (ERCP) WITH PROPOFOL ;  Surgeon: Charlanne Groom, MD;  Location: [facility] ENDOSCOPY;  Service: Endoscopy;  Laterality: N/A;   ENDOSCOPIC RETROGRADE CHOLANGIOPANCREATOGRAPHY (ERCP) WITH PROPOFOL  N/A 10/15/2021   Procedure: ENDOSCOPIC RETROGRADE CHOLANGIOPANCREATOGRAPHY (ERCP) WITH PROPOFOL ;  Surgeon: Charlanne Groom, MD;  Location: [facility] ENDOSCOPY;  Service: Gastroenterology;  Laterality: N/A;   ENDOSCOPIC RETROGRADE CHOLANGIOPANCREATOGRAPHY (ERCP) WITH PROPOFOL  N/A 12/09/2021   Procedure: ENDOSCOPIC RETROGRADE CHOLANGIOPANCREATOGRAPHY (ERCP) WITH PROPOFOL ;  Surgeon: Wilhelmenia Aloha Raddle., MD;  Location: [facility] ENDOSCOPY;  Service: Gastroenterology;  Laterality: N/A;   ERCP N/A 03/09/2016   Procedure: ENDOSCOPIC RETROGRADE CHOLANGIOPANCREATOGRAPHY (ERCP);  Surgeon: Oliva Boots, MD;  Location:  [facility] ENDOSCOPY;  Service: Endoscopy;  Laterality: N/A;   ERCP N/A 06/12/2018   Procedure: ENDOSCOPIC RETROGRADE CHOLANGIOPANCREATOGRAPHY (ERCP);  Surgeon: Boots Oliva, MD;  Location: [facility] ENDOSCOPY;  Service: Endoscopy;  Laterality: N/A;   ESOPHAGOGASTRODUODENOSCOPY N/A 06/12/2018   Procedure: ESOPHAGOGASTRODUODENOSCOPY (EGD);  Surgeon: Boots Oliva, MD;  Location: [facility] ENDOSCOPY;  Service: Endoscopy;  Laterality: N/A;   laser vein surgery     last year   LITHOTRIPSY  12/09/2021   Procedure: LITHOTRIPSY;  Surgeon: Mansouraty, Aloha Raddle., MD;  Location: [facility] ENDOSCOPY;  Service: Gastroenterology;;   REMOVAL OF STONES  06/12/2018   Procedure: REMOVAL OF STONES;  Surgeon: Boots Oliva, MD;  Location: [facility] ENDOSCOPY;  Service: Endoscopy;;   REMOVAL OF STONES  02/19/2020   Procedure: REMOVAL OF STONES;  Surgeon: Charlanne Groom, MD;  Location: [facility] ENDOSCOPY;  Service: Endoscopy;;   REMOVAL OF STONES  10/15/2021   Procedure: REMOVAL OF STONES;  Surgeon: Charlanne Groom, MD;  Location: [facility] ENDOSCOPY;  Service: Gastroenterology;;   REMOVAL OF STONES  12/09/2021   Procedure: REMOVAL OF STONES;  Surgeon: Wilhelmenia Aloha Raddle., MD;  Location: [facility] ENDOSCOPY;  Service: Gastroenterology;;   ROBOTIC ASSISTED SALPINGO OOPHERECTOMY Bilateral 12/21/2021   Procedure: XI ROBOTIC ASSISTED SALPINGO OOPHORECTOMY;  Surgeon: Viktoria Comer SAUNDERS, MD;  Location: [facility] ORS;  Service: Gynecology;  Laterality: Bilateral;   SPHINCTEROTOMY  06/12/2018   Procedure: SPHINCTEROTOMY;  Surgeon: Boots Oliva, MD;  Location: [facility] ENDOSCOPY;  Service: Endoscopy;;   SPHINCTEROTOMY  02/19/2020   Procedure: ANNETT;  Surgeon: Charlanne Groom, MD;  Location: [facility] ENDOSCOPY;  Service: Endoscopy;;   SPYGLASS CHOLANGIOSCOPY N/A 10/15/2021   Procedure: DEBHOJDD CHOLANGIOSCOPY;  Surgeon: Charlanne Groom, MD;  Location: [facility] ENDOSCOPY;  Service: Gastroenterology;  Laterality: N/A;   SPYGLASS CHOLANGIOSCOPY N/A 12/09/2021   Procedure: SPYGLASS CHOLANGIOSCOPY;   Surgeon: Wilhelmenia Aloha Raddle., MD;  Location: [facility] ENDOSCOPY;  Service: Gastroenterology;  Laterality: N/A;   SPYGLASS LITHOTRIPSY N/A 10/15/2021   Procedure: DEBHOJDD LITHOTRIPSY;  Surgeon: Charlanne Groom, MD;  Location: [facility] ENDOSCOPY;  Service: Gastroenterology;  Laterality: N/A;   STENT REMOVAL  12/09/2021   Procedure: STENT REMOVAL;  Surgeon: Wilhelmenia Aloha Raddle., MD;  Location: [facility] ENDOSCOPY;  Service: Gastroenterology;;   TOTAL HIP ARTHROPLASTY Left 12/29/2023   TOTAL SHOULDER REPLACEMENT Right 02/08/2023   TUBAL LIGATION  03/17/1977   at time of c-section   VARICOSE VEIN SURGERY Left    Family History  Problem Relation Age of Onset   Memory loss Mother    Ovarian cancer Mother    Cancer Mother    Colon cancer Neg Hx    Breast cancer Neg Hx    Endometrial cancer Neg Hx    Pancreatic cancer Neg Hx    Prostate cancer Neg Hx    Social History   Socioeconomic History   Marital status: Widowed    Spouse name: Vicenta   Number of children: 4   Years of education: Not on file   Highest education level: 12th grade  Occupational History   Not on file  Tobacco Use   Smoking status: Former    Current packs/day: 0.50    Average packs/day: 0.5 packs/day for 35.0 years (17.5 ttl pk-yrs)    Types: Cigarettes    Passive exposure: Past   Smokeless tobacco: Never   Tobacco comments:    No smoking since 02/2016  Vaping Use   Vaping status: Never Used  Substance and Sexual Activity   Alcohol  use: Not Currently    Comment: occ   Drug use: Never   Sexual activity: Not Currently    Partners: Male    Birth control/protection: Post-menopausal, Surgical    Comment: BTL  Other Topics Concern   Not on file  Social History Narrative   Not on file   Social Drivers of Health   Financial Resource Strain: Patient Declined (06/13/2024)   Overall Financial Resource Strain (CARDIA)    Difficulty of Paying Living Expenses: Patient declined  Food Insecurity: Patient Declined (06/13/2024)    Hunger Vital Sign    Worried About Running Out of Food in the Last Year: Patient declined    Ran Out of Food in the Last Year: Patient declined  Transportation Needs: Patient Declined (06/13/2024)   PRAPARE - Administrator, Civil Service (Medical): Patient declined    Lack of Transportation (Non-Medical): Patient declined  Physical Activity: Unknown (06/13/2024)   Exercise Vital Sign    Days of Exercise per Week: 5 days    Minutes of Exercise per Session: Patient declined  Recent Concern: Physical Activity - Insufficiently Active (04/13/2024)   Exercise Vital Sign    Days of Exercise per Week: 2 days    Minutes of Exercise per Session: 30 min  Stress: Patient Declined (06/13/2024)   [employer] of Occupational Health - Occupational Stress Questionnaire    Feeling of Stress: Patient declined  Social Connections: Unknown (06/13/2024)   Social Connection and Isolation Panel    Frequency of  Communication with Friends and Family: Patient declined    Frequency of Social Gatherings with Friends and Family: Patient declined    Attends Religious Services: Patient declined    Database Administrator or Organizations: Patient declined    Attends Engineer, Structural: Not on file    Marital Status: Patient declined  Recent Concern: Social Connections - Socially Isolated (04/13/2024)   Social Connection and Isolation Panel    Frequency of Communication with Friends and Family: More than three times a week    Frequency of Social Gatherings with Friends and Family: Once a week    Attends Religious Services: Never    Database Administrator or Organizations: No    Attends Engineer, Structural: Not on file    Marital Status: Widowed    Tobacco Counseling Counseling given: Not Answered Tobacco comments: No smoking since 02/2016    Clinical Intake:  Pre-visit preparation completed: Yes  Pain : No/denies pain     Nutritional Risks: None Diabetes:  No  Lab Results  Component Value Date   HGBA1C 5.5 03/04/2016     How often do you need to have someone help you when you read instructions, pamphlets, or other written materials from your doctor or pharmacy?: 1 - Never  Interpreter Needed?: No  Information entered by :: NAllen LPN   Activities of Daily Living     06/13/2024    9:19 AM  In your present state of health, do you have any difficulty performing the following activities:  Hearing? 0  Vision? 0  Difficulty concentrating or making decisions? 0  Walking or climbing stairs? 0  Dressing or bathing? 0  Doing errands, shopping? 0  Preparing Food and eating ? N  Using the Toilet? N  In the past six months, have you accidently leaked urine? N  Do you have problems with loss of bowel control? N  Managing your Medications? N  Managing your Finances? N  Housekeeping or managing your Housekeeping? N    Patient Care Team: Nche, Roselie Rockford, NP as PCP - General (Internal Medicine)  I have updated your Care Teams any recent Medical Services you may have received from other providers in the past year.     Assessment:   This is a routine wellness examination for Jasmine Bradford.  Hearing/Vision screen Hearing Screening - Comments:: Denies hearing issues Vision Screening - Comments:: Regular eye exams, [facility]   Goals Addressed             This Visit's Progress    Patient Stated       06/17/2024, wants to lose weight       Depression Screen     06/17/2024   10:45 AM 09/25/2023    8:41 AM 06/12/2023   10:49 AM 02/06/2023    9:11 AM 10/24/2022   12:04 PM 09/05/2022    9:45 AM 06/07/2022    2:49 PM  PHQ 2/9 Scores  PHQ - 2 Score 0 0 0 1 1 0 0  PHQ- 9 Score   0 3 3      Fall Risk     06/13/2024    9:19 AM 09/25/2023    8:41 AM 06/05/2023    8:09 AM 02/06/2023    8:44 AM 10/24/2022   12:05 PM  Fall Risk   Falls in the past year? 0 0 0 0 1  Number falls in past yr: 0 0 0 0 0  Injury with Fall? 0 0 0 0  0  Risk for  fall due to : Medication side effect No Fall Risks Medication side effect No Fall Risks No Fall Risks  Follow up Falls prevention discussed;Falls evaluation completed Falls evaluation completed Falls prevention discussed;Falls evaluation completed Falls evaluation completed Falls evaluation completed    MEDICARE RISK AT HOME:  Medicare Risk at Home Any stairs in or around the home?: (Patient-Rptd) Yes If so, are there any without handrails?: (Patient-Rptd) No Home free of loose throw rugs in walkways, pet beds, electrical cords, etc?: (Patient-Rptd) Yes Adequate lighting in your home to reduce risk of falls?: (Patient-Rptd) Yes Life alert?: (Patient-Rptd) No Use of a cane, walker or w/c?: (Patient-Rptd) No Grab bars in the bathroom?: (Patient-Rptd) No Shower chair or bench in shower?: (Patient-Rptd) Yes Elevated toilet seat or a handicapped toilet?: (Patient-Rptd) No  TIMED UP AND GO:  Was the test performed?  No  Cognitive Function: 6CIT completed        06/17/2024   10:45 AM 06/12/2023   10:50 AM 06/07/2022    2:51 PM 05/15/2021    9:39 AM  6CIT Screen  What Year? 0 points 0 points 0 points 0 points  What month? 0 points 0 points 0 points 0 points  What time? 0 points 0 points 0 points 0 points  Count back from 20 0 points 0 points 0 points 0 points  Months in reverse 0 points 0 points 0 points 0 points  Repeat phrase 0 points 0 points 0 points 0 points  Total Score 0 points 0 points 0 points 0 points    Immunizations Immunization History  Administered Date(s) Administered   Fluad Quad(high Dose 65+) 04/25/2020, 05/07/2022, 05/13/2023   INFLUENZA, HIGH DOSE SEASONAL PF 04/13/2024   Influenza, Quadrivalent, Recombinant, Inj, Pf 06/09/2019   Influenza,inj,Quad PF,6+ Mos 06/07/2016, 05/27/2018   Influenza-Unspecified 05/31/2021   PFIZER Comirnaty(Gray Top)Covid-19 Tri-Sucrose Vaccine 01/10/2021   PFIZER(Purple Top)SARS-COV-2 Vaccination 10/13/2019, 11/04/2019,  06/13/2020, 01/10/2021   Pfizer Covid-19 Vaccine Bivalent Booster 47yrs & up 07/01/2021   Pfizer(Comirnaty)Fall Seasonal Vaccine 12 years and older 07/08/2022   Pneumococcal Conjugate-13 06/09/2019   Pneumococcal Polysaccharide-23 05/23/2020, 06/20/2020   Respiratory Syncytial Virus Vaccine,Recomb Aduvanted(Arexvy) 08/27/2022   Tdap 02/10/2015, 05/13/2023   Unspecified SARS-COV-2 Vaccination 05/13/2023   Zoster Recombinant(Shingrix) 10/19/2017, 02/06/2018, 02/10/2018    Screening Tests Health Maintenance  Topic Date Due   COVID-19 Vaccine (9 - 2025-26 season) 04/22/2024   Mammogram  10/18/2024   Medicare Annual Wellness (AWV)  06/17/2025   Colonoscopy  12/08/2029   DTaP/Tdap/Td (3 - Td or Tdap) 05/12/2033   Pneumococcal Vaccine: 50+ Years  Completed   Influenza Vaccine  Completed   DEXA SCAN  Completed   Hepatitis C Screening  Completed   Zoster Vaccines- Shingrix  Completed   Meningococcal B Vaccine  Aged Out    Health Maintenance Items Addressed: Vaccines Due: covid  Additional Screening:  Vision Screening: Recommended annual ophthalmology exams for early detection of glaucoma and other disorders of the eye. Is the patient up to date with their annual eye exam?  Yes  Who is the provider or what is the name of the office in which the patient attends annual eye exams? [facility]  Dental Screening: Recommended annual dental exams for proper oral hygiene  Community Resource Referral / Chronic Care Management: CRR required this visit?  No   CCM required this visit?  No   Plan:    I have personally reviewed and noted the following in the patient's chart:   Medical  and social history Use of alcohol , tobacco or illicit drugs  Current medications and supplements including opioid prescriptions. Patient is not currently taking opioid prescriptions. Functional ability and status Nutritional status Physical activity Advanced directives List of other  physicians Hospitalizations, surgeries, and ER visits in previous 12 months Vitals Screenings to include cognitive, depression, and falls Referrals and appointments  In addition, I have reviewed and discussed with patient certain preventive protocols, quality metrics, and best practice recommendations. A written personalized care plan for preventive services as well as general preventive health recommendations were provided to patient.   Jasmine FORBES Dawn, LPN   [id number]   After Visit Summary: (MyChart) Due to this being a telephonic visit, the after visit summary with patients personalized plan was offered to patient via MyChart   Notes: Nothing significant to report at this time.

## 2024-06-17 NOTE — Patient Instructions (Signed)
 Jasmine Bradford,  Thank you for taking the time for your Medicare Wellness Visit. I appreciate your continued commitment to your health goals. Please review the care plan we discussed, and feel free to reach out if I can assist you further.  Medicare recommends these wellness visits once per year to help you and your care team stay ahead of potential health issues. These visits are designed to focus on prevention, allowing your provider to concentrate on managing your acute and chronic conditions during your regular appointments.  Please note that Annual Wellness Visits do not include a physical exam. Some assessments may be limited, especially if the visit was conducted virtually. If needed, we may recommend a separate in-person follow-up with your provider.  Ongoing Care Seeing your primary care provider every 3 to 6 months helps us  monitor your health and provide consistent, personalized care.   Referrals If a referral was made during today's visit and you haven't received any updates within two weeks, please contact the referred provider directly to check on the status.  Recommended Screenings:  Health Maintenance  Topic Date Due   COVID-19 Vaccine (9 - 2025-26 season) 04/22/2024   Breast Cancer Screening  10/18/2024   Medicare Annual Wellness Visit  06/17/2025   Colon Cancer Screening  12/08/2029   DTaP/Tdap/Td vaccine (3 - Td or Tdap) 05/12/2033   Pneumococcal Vaccine for age over 11  Completed   Flu Shot  Completed   DEXA scan (bone density measurement)  Completed   Hepatitis C Screening  Completed   Zoster (Shingles) Vaccine  Completed   Meningitis B Vaccine  Aged Out       06/17/2024   10:44 AM  Advanced Directives  Does Patient Have a Medical Advance Directive? No   Advance Care Planning is important because it: Ensures you receive medical care that aligns with your values, goals, and preferences. Provides guidance to your family and loved ones, reducing the emotional  burden of decision-making during critical moments.  Vision: Annual vision screenings are recommended for early detection of glaucoma, cataracts, and diabetic retinopathy. These exams can also reveal signs of chronic conditions such as diabetes and high blood pressure.  Dental: Annual dental screenings help detect early signs of oral cancer, gum disease, and other conditions linked to overall health, including heart disease and diabetes.  Please see the attached documents for additional preventive care recommendations.

## 2024-06-28 ENCOUNTER — Ambulatory Visit (INDEPENDENT_AMBULATORY_CARE_PROVIDER_SITE_OTHER)

## 2024-06-28 ENCOUNTER — Ambulatory Visit
Admission: EM | Admit: 2024-06-28 | Discharge: 2024-06-28 | Disposition: A | Discharge status: Home / Self Care | Attending: Family Medicine | Admitting: Family Medicine

## 2024-06-28 DIAGNOSIS — M1611 Unilateral primary osteoarthritis, right hip: Secondary | ICD-10-CM | POA: Diagnosis not present

## 2024-06-28 DIAGNOSIS — Z96642 Presence of left artificial hip joint: Secondary | ICD-10-CM | POA: Diagnosis not present

## 2024-06-28 DIAGNOSIS — M25551 Pain in right hip: Secondary | ICD-10-CM | POA: Diagnosis not present

## 2024-06-28 MED ORDER — DICLOFENAC SODIUM 50 MG PO TBEC: 50.0000 mg | DELAYED_RELEASE_TABLET | Freq: Two times a day (BID) | ORAL | 0 refills | Status: AC | PRN

## 2024-06-28 NOTE — Discharge Instructions (Signed)
 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  The radiologist will also read your x-ray, and if their interpretation differs significantly from mine, and the management of your condition would change, we will call you.  Diclofenac  50 mg--1 tablet 2 times daily as needed for pain while you are taking this medication, do not take ibuprofen  or naproxen with it.  Please follow-up with your orthopedic office about this problem.

## 2024-06-28 NOTE — ED Provider Notes (Signed)
 [facility]    CSN: [id number] Arrival date & time: 06/28/24  9075      History   Chief Complaint Chief Complaint  Patient presents with   Pain    HPI Jasmine Bradford is a 68 y.o. female.   HPI Here for pain in her right inguinal area/hip.  It began about 2 days ago.  She wonders if she overdid doing a bunch of things at her house when she was off from work.  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  She has had more pain when she bears weight and now feels like she is having some more pain in her right low back and posterior hip as she walks.  She cannot tolerate oxycodone  and hydrocodone.  Potassium causes itching and a rash and latex also causes her problems.  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   Arthralgia of multiple joints 10/27/2022   Family history of ovarian cancer 10/21/2021   Ovarian cyst 10/14/2021   Mixed hyperlipidemia 10/06/2021   Atherosclerosis of aorta 10/06/2021   Osteoporosis 02/18/2021   Choledocholithiasis    GERD (gastroesophageal reflux disease) 02/18/2020   Elevated LFTs and hyperbilirubinemia    Atypical chest pain 03/03/2016   Former tobacco use     Past Surgical History:  Procedure Laterality Date   BILIARY BRUSHING  12/09/2021   Procedure: BILIARY BRUSHING;  Surgeon: Wilhelmenia Aloha Raddle., MD;  Location: [facility] ENDOSCOPY;  Service: Gastroenterology;;   BILIARY  DILATION  02/19/2020   Procedure: BILIARY DILATION;  Surgeon: Charlanne Groom, MD;  Location: [facility] ENDOSCOPY;  Service: Endoscopy;;   BILIARY DILATION  10/15/2021   Procedure: BILIARY DILATION;  Surgeon: Charlanne Groom, MD;  Location: [facility] ENDOSCOPY;  Service: Gastroenterology;;   BILIARY DILATION  12/09/2021   Procedure: BILIARY DILATION;  Surgeon: Wilhelmenia Aloha Raddle., MD;  Location: [facility] ENDOSCOPY;  Service: Gastroenterology;;   BILIARY STENT PLACEMENT  10/15/2021   Procedure: BILIARY STENT PLACEMENT;  Surgeon: Charlanne Groom, MD;  Location: [facility] ENDOSCOPY;  Service: Gastroenterology;;   CESAREAN SECTION     CHOLECYSTECTOMY N/A 05/23/2016   Procedure: LAPAROSCOPIC CHOLECYSTECTOMY WITH INTRAOPERATIVE CHOLANGIOGRAM;  Surgeon: Krystal Russell, MD;  Location: [facility] OR;  Service: General;  Laterality: N/A;   ENDOSCOPIC RETROGRADE CHOLANGIOPANCREATOGRAPHY (ERCP) WITH PROPOFOL  N/A 06/06/2016   Procedure: ENDOSCOPIC RETROGRADE CHOLANGIOPANCREATOGRAPHY (ERCP) WITH PROPOFOL ;  Surgeon: Oliva Boots, MD;  Location: [facility] ENDOSCOPY;  Service: Endoscopy;  Laterality: N/A;   ENDOSCOPIC RETROGRADE CHOLANGIOPANCREATOGRAPHY (ERCP) WITH PROPOFOL  N/A 02/19/2020   Procedure: ENDOSCOPIC RETROGRADE CHOLANGIOPANCREATOGRAPHY (ERCP) WITH PROPOFOL ;  Surgeon: Charlanne Groom, MD;  Location: [facility] ENDOSCOPY;  Service: Endoscopy;  Laterality: N/A;   ENDOSCOPIC RETROGRADE CHOLANGIOPANCREATOGRAPHY (ERCP) WITH PROPOFOL  N/A 10/15/2021   Procedure: ENDOSCOPIC RETROGRADE CHOLANGIOPANCREATOGRAPHY (ERCP) WITH PROPOFOL ;  Surgeon: Charlanne Groom, MD;  Location: [facility] ENDOSCOPY;  Service: Gastroenterology;  Laterality: N/A;   ENDOSCOPIC RETROGRADE CHOLANGIOPANCREATOGRAPHY (ERCP) WITH PROPOFOL  N/A 12/09/2021  Procedure: ENDOSCOPIC RETROGRADE CHOLANGIOPANCREATOGRAPHY (ERCP) WITH PROPOFOL ;  Surgeon: Wilhelmenia Aloha Raddle., MD;  Location: [facility] ENDOSCOPY;  Service: Gastroenterology;  Laterality: N/A;   ERCP N/A 03/09/2016   Procedure: ENDOSCOPIC RETROGRADE  CHOLANGIOPANCREATOGRAPHY (ERCP);  Surgeon: Oliva Boots, MD;  Location: [facility] ENDOSCOPY;  Service: Endoscopy;  Laterality: N/A;   ERCP N/A 06/12/2018   Procedure: ENDOSCOPIC RETROGRADE CHOLANGIOPANCREATOGRAPHY (ERCP);  Surgeon: Boots Oliva, MD;  Location: [facility] ENDOSCOPY;  Service: Endoscopy;  Laterality: N/A;   ESOPHAGOGASTRODUODENOSCOPY N/A 06/12/2018   Procedure: ESOPHAGOGASTRODUODENOSCOPY (EGD);  Surgeon: Boots Oliva, MD;  Location: [facility] ENDOSCOPY;  Service: Endoscopy;  Laterality: N/A;   laser vein surgery     last year   LITHOTRIPSY  12/09/2021   Procedure: LITHOTRIPSY;  Surgeon: Mansouraty, Aloha Raddle., MD;  Location: [facility] ENDOSCOPY;  Service: Gastroenterology;;   REMOVAL OF STONES  06/12/2018   Procedure: REMOVAL OF STONES;  Surgeon: Boots Oliva, MD;  Location: [facility] ENDOSCOPY;  Service: Endoscopy;;   REMOVAL OF STONES  02/19/2020   Procedure: REMOVAL OF STONES;  Surgeon: Charlanne Groom, MD;  Location: [facility] ENDOSCOPY;  Service: Endoscopy;;   REMOVAL OF STONES  10/15/2021   Procedure: REMOVAL OF STONES;  Surgeon: Charlanne Groom, MD;  Location: [facility] ENDOSCOPY;  Service: Gastroenterology;;   REMOVAL OF STONES  12/09/2021   Procedure: REMOVAL OF STONES;  Surgeon: Wilhelmenia Aloha Raddle., MD;  Location: [facility] ENDOSCOPY;  Service: Gastroenterology;;   ROBOTIC ASSISTED SALPINGO OOPHERECTOMY Bilateral 12/21/2021   Procedure: XI ROBOTIC ASSISTED SALPINGO OOPHORECTOMY;  Surgeon: Viktoria Comer SAUNDERS, MD;  Location: [facility] ORS;  Service: Gynecology;  Laterality: Bilateral;   SPHINCTEROTOMY  06/12/2018   Procedure: SPHINCTEROTOMY;  Surgeon: Boots Oliva, MD;  Location: [facility] ENDOSCOPY;  Service: Endoscopy;;   SPHINCTEROTOMY  02/19/2020   Procedure: ANNETT;  Surgeon: Charlanne Groom, MD;  Location: [facility] ENDOSCOPY;  Service: Endoscopy;;   SPYGLASS CHOLANGIOSCOPY N/A 10/15/2021   Procedure: DEBHOJDD CHOLANGIOSCOPY;  Surgeon: Charlanne Groom, MD;  Location: [facility] ENDOSCOPY;  Service: Gastroenterology;  Laterality: N/A;   SPYGLASS  CHOLANGIOSCOPY N/A 12/09/2021   Procedure: SPYGLASS CHOLANGIOSCOPY;  Surgeon: Wilhelmenia Aloha Raddle., MD;  Location: [facility] ENDOSCOPY;  Service: Gastroenterology;  Laterality: N/A;   SPYGLASS LITHOTRIPSY N/A 10/15/2021   Procedure: DEBHOJDD LITHOTRIPSY;  Surgeon: Charlanne Groom, MD;  Location: [facility] ENDOSCOPY;  Service: Gastroenterology;  Laterality: N/A;   STENT REMOVAL  12/09/2021   Procedure: STENT REMOVAL;  Surgeon: Wilhelmenia Aloha Raddle., MD;  Location: [facility] ENDOSCOPY;  Service: Gastroenterology;;   TOTAL HIP ARTHROPLASTY Left 12/29/2023   TOTAL SHOULDER REPLACEMENT Right 02/08/2023   TUBAL LIGATION  03/17/1977   at time of c-section   VARICOSE VEIN SURGERY Left     OB History     Gravida  4   Para      Term      Preterm      AB      Living  4      SAB      IAB      Ectopic      Multiple      Live Births  4            Home Medications    Prior to Admission medications   Medication Sig Start Date End Date Taking? Authorizing Provider  diclofenac  (VOLTAREN) 50 MG EC tablet Take 1 tablet (50 mg total) by mouth 2 (two) times daily as needed (pain). 06/28/24  Yes Vonna Sharlet POUR, MD  famotidine  (PEPCID ) 20 MG tablet TAKE 1 TABLET BY MOUTH EVERYDAY AT BEDTIME 04/01/24  Yes Cirigliano,  Vito V, DO  FLUZONE HIGH-DOSE 0.5 ML injection Inject 0.5 mLs into the muscle once. 04/13/24  Yes [provider]  Acetaminophen  (TYLENOL  ARTHRITIS PAIN PO) Take by mouth as needed.    [provider]  atorvastatin  (LIPITOR) 20 MG tablet TAKE 1 TABLET BY MOUTH 3 TIMES A WEEK, AS DIRECTED 03/11/24   Nche, Roselie Rockford, NP  Calcium  Carbonate-Vit D-Min (CALCIUM  1200 PO) Take 1 tablet by mouth daily. 600 mg daily    [provider]  Carboxymethylcellulose Sodium (THERATEARS) 0.25 % SOLN Place 1 drop into both eyes as needed (eye irritation).    [provider]  cetirizine (ZYRTEC) 10 MG tablet Take 10 mg by mouth daily as needed for allergies.    [provider]  Multiple Vitamin (MULTIVITAMIN WITH MINERALS) TABS tablet Take 1 tablet by mouth daily with lunch.    [provider]  Probiotic Product (PROBIOTIC PO) Take 1 tablet by mouth daily.    [provider]  ursodiol  (ACTIGALL ) 250 MG tablet TAKE 1 TABLET BY MOUTH TWICE A DAY 01/16/24   Cirigliano, Sandor GAILS, DO    Family History Family History  Problem Relation Age of Onset   Memory loss Mother    Ovarian cancer Mother    Cancer Mother    Colon cancer Neg Hx    Breast cancer Neg Hx    Endometrial cancer Neg Hx    Pancreatic cancer Neg Hx    Prostate cancer Neg Hx     Social History Social History   Tobacco Use   Smoking status: Former    Current packs/day: 0.50    Average packs/day: 0.5 packs/day for 35.0 years (17.5 ttl pk-yrs)    Types: Cigarettes    Passive exposure: Past   Smokeless tobacco: Never   Tobacco comments:    No smoking since 02/2016  Vaping Use   Vaping status: Never Used  Substance Use Topics   Alcohol  use: Not Currently    Comment: occ   Drug use: Never     Allergies   Latex, Hydrocodone, Oxycodone , Potassium, and Tape   Review of Systems Review of Systems   Physical Exam Triage Vital Signs ED Triage Vitals  Encounter Vitals Group     BP 06/28/24 0939 128/75     Girls Systolic BP Percentile --      Girls Diastolic BP Percentile --      Boys Systolic BP Percentile --      Boys Diastolic BP Percentile --      Pulse Rate 06/28/24 0939 67     Resp 06/28/24 0939 20     Temp 06/28/24 0939 98.3 F (36.8 C)     Temp Source 06/28/24 0939 Oral     SpO2 06/28/24 0939 98 %     Weight 06/28/24 0937 134 lb (60.8 kg)     Height 06/28/24 0937 5' 1 (1.549 m)     Head Circumference --      Peak Flow --      Pain Score 06/28/24 0934 6     Pain Loc --      Pain Education --      Exclude from Growth Chart --    No data found.  Updated Vital Signs BP 128/75 (BP Location: Left Arm)   Pulse 67   Temp 98.3 F (36.8 C)  (Oral)   Resp 20   Ht 5' 1 (1.549 m)   Wt 60.8 kg   SpO2 98%   BMI  25.32 kg/m   Visual Acuity Right Eye Distance:   Left Eye Distance:   Bilateral Distance:    Right Eye Near:   Left Eye Near:    Bilateral Near:     Physical Exam   UC Treatments / Results  Labs (all labs ordered are listed, but only abnormal results are displayed) Labs Reviewed - No data to display  EKG   Radiology No results found.  Procedures Procedures (including critical care time)  Medications Ordered in UC Medications - No data to display  Initial Impression / Assessment and Plan / UC Course  I have reviewed the triage vital signs and the nursing notes.  Pertinent labs & imaging results that were available during my care of the patient were reviewed by me and considered in my medical decision making (see chart for details).      There is significant narrowing of the joint space in the right hip joint.  No acute bony abnormality.  There is a little bit of spurring also seen.  Left hip replacement hardware is in place by my review.  She is advised of radiology over read.   Final Clinical Impressions(s) / UC Diagnoses   Final diagnoses:  Right hip pain     Discharge Instructions      There is a good bit of narrowing in the right hip joint by my review, consistent with arthritis in your right hip.  The radiologist will also read your x-ray, and if their interpretation differs significantly from mine, and the management of your condition would change, we will call you.  Diclofenac  50 mg--1 tablet 2 times daily as needed for pain while you are taking this medication, do not take ibuprofen  or naproxen with it.  Please follow-up with your orthopedic office about this problem.     ED Prescriptions     Medication Sig Dispense Auth. Provider   diclofenac  (VOLTAREN) 50 MG EC tablet Take 1 tablet (50 mg total) by mouth 2 (two) times daily as needed (pain). 60 tablet Alondra Sahni,  Sharlet POUR, MD      PDMP not reviewed this encounter.   Vonna Sharlet POUR, MD 06/28/24 1105

## 2024-06-28 NOTE — ED Triage Notes (Signed)
 Patient reports having a possible pulled or strain ligament or muscle in right groin radiating to hip and down leg at times. Walking like she has to due to this pain is now hurting her right lower back as well. No obvious injury or fall. Might have overworked something at home. Very worried about something being wrong with hip (right), history of replacement on left side.

## 2024-07-01 ENCOUNTER — Ambulatory Visit (HOSPITAL_COMMUNITY): Payer: Self-pay

## 2024-07-09 DIAGNOSIS — Z96642 Presence of left artificial hip joint: Secondary | ICD-10-CM | POA: Diagnosis not present

## 2024-07-09 DIAGNOSIS — M2170 Unequal limb length (acquired), unspecified site: Secondary | ICD-10-CM | POA: Diagnosis not present

## 2024-07-09 DIAGNOSIS — M1611 Unilateral primary osteoarthritis, right hip: Secondary | ICD-10-CM | POA: Diagnosis not present

## 2024-08-28 ENCOUNTER — Other Ambulatory Visit: Payer: Self-pay | Admitting: Nurse Practitioner

## 2024-08-28 DIAGNOSIS — E782 Mixed hyperlipidemia: Secondary | ICD-10-CM

## 2024-10-16 ENCOUNTER — Ambulatory Visit: Admitting: Nurse Practitioner

## 2025-06-23 ENCOUNTER — Ambulatory Visit
